# Patient Record
Sex: Male | Born: 1960
Health system: Southern US, Community
[De-identification: ages and names within clinical notes are randomized; demographics above are authoritative.]

## PROBLEM LIST (undated history)

## (undated) DIAGNOSIS — Z955 Presence of coronary angioplasty implant and graft: Secondary | ICD-10-CM

## (undated) DIAGNOSIS — Z8709 Personal history of other diseases of the respiratory system: Secondary | ICD-10-CM

## (undated) DIAGNOSIS — K5730 Diverticulosis of large intestine without perforation or abscess without bleeding: Secondary | ICD-10-CM

## (undated) DIAGNOSIS — Z8619 Personal history of other infectious and parasitic diseases: Secondary | ICD-10-CM

## (undated) DIAGNOSIS — M539 Dorsopathy, unspecified: Secondary | ICD-10-CM

## (undated) DIAGNOSIS — L409 Psoriasis, unspecified: Secondary | ICD-10-CM

## (undated) DIAGNOSIS — G4733 Obstructive sleep apnea (adult) (pediatric): Secondary | ICD-10-CM

## (undated) DIAGNOSIS — Z960 Presence of urogenital implants: Secondary | ICD-10-CM

## (undated) DIAGNOSIS — N401 Enlarged prostate with lower urinary tract symptoms: Secondary | ICD-10-CM

## (undated) DIAGNOSIS — R338 Other retention of urine: Secondary | ICD-10-CM

## (undated) DIAGNOSIS — Z978 Presence of other specified devices: Secondary | ICD-10-CM

## (undated) DIAGNOSIS — I10 Essential (primary) hypertension: Secondary | ICD-10-CM

## (undated) DIAGNOSIS — I2510 Atherosclerotic heart disease of native coronary artery without angina pectoris: Secondary | ICD-10-CM

## (undated) DIAGNOSIS — E785 Hyperlipidemia, unspecified: Secondary | ICD-10-CM

## (undated) HISTORY — DX: Obstructive sleep apnea (adult) (pediatric): G47.33

## (undated) HISTORY — PX: CORONARY ANGIOPLASTY WITH STENT PLACEMENT: SHX49

## (undated) HISTORY — PX: COLONOSCOPY WITH PROPOFOL: SHX5780

## (undated) HISTORY — DX: Psoriasis, unspecified: L40.9

## (undated) HISTORY — DX: Atherosclerotic heart disease of native coronary artery without angina pectoris: I25.10

## (undated) HISTORY — DX: Hyperlipidemia, unspecified: E78.5

## (undated) HISTORY — DX: Essential (primary) hypertension: I10

---

## 1998-05-27 HISTORY — PX: BUNIONECTOMY: SHX129

## 1999-05-28 HISTORY — PX: WRIST GANGLION EXCISION: SUR520

## 2003-11-21 ENCOUNTER — Encounter: Payer: Self-pay | Admitting: Internal Medicine

## 2004-08-23 ENCOUNTER — Ambulatory Visit: Payer: Self-pay | Admitting: Family Medicine

## 2004-11-23 ENCOUNTER — Ambulatory Visit: Payer: Self-pay | Admitting: Internal Medicine

## 2004-12-24 ENCOUNTER — Ambulatory Visit: Payer: Self-pay | Admitting: Internal Medicine

## 2005-03-07 ENCOUNTER — Encounter: Admission: RE | Admit: 2005-03-07 | Discharge: 2005-03-07 | Payer: Self-pay | Admitting: Internal Medicine

## 2005-03-07 ENCOUNTER — Ambulatory Visit: Payer: Self-pay | Admitting: Internal Medicine

## 2005-03-11 ENCOUNTER — Ambulatory Visit: Payer: Self-pay | Admitting: Internal Medicine

## 2005-03-11 ENCOUNTER — Encounter: Admission: RE | Admit: 2005-03-11 | Discharge: 2005-03-11 | Payer: Self-pay | Admitting: Internal Medicine

## 2005-03-28 ENCOUNTER — Ambulatory Visit: Payer: Self-pay | Admitting: Internal Medicine

## 2005-04-29 ENCOUNTER — Ambulatory Visit: Payer: Self-pay | Admitting: Internal Medicine

## 2005-06-17 ENCOUNTER — Ambulatory Visit: Payer: Self-pay | Admitting: Internal Medicine

## 2005-07-02 ENCOUNTER — Ambulatory Visit: Payer: Self-pay | Admitting: Internal Medicine

## 2005-07-29 ENCOUNTER — Ambulatory Visit: Payer: Self-pay | Admitting: Internal Medicine

## 2005-12-09 ENCOUNTER — Ambulatory Visit: Payer: Self-pay | Admitting: Internal Medicine

## 2006-02-10 ENCOUNTER — Ambulatory Visit: Payer: Self-pay | Admitting: Internal Medicine

## 2006-06-18 ENCOUNTER — Ambulatory Visit: Payer: Self-pay | Admitting: Internal Medicine

## 2006-06-18 LAB — CONVERTED CEMR LAB
ALT: 33 units/L (ref 0–40)
AST: 25 units/L (ref 0–37)
Basophils Absolute: 0 10*3/uL (ref 0.0–0.1)
Basophils Relative: 0.5 % (ref 0.0–1.0)
Calcium: 9.4 mg/dL (ref 8.4–10.5)
Cholesterol: 207 mg/dL (ref 0–200)
Direct LDL: 130.7 mg/dL
Eosinophils Absolute: 0 10*3/uL (ref 0.0–0.6)
Eosinophils Relative: 0.6 % (ref 0.0–5.0)
Free T4: 0.8 ng/dL (ref 0.6–1.6)
HCT: 47.2 % (ref 39.0–52.0)
HDL: 42.5 mg/dL (ref >39.0)
Hemoglobin: 16.5 g/dL (ref 13.0–17.0)
Hgb A1c MFr Bld: 5.2 % (ref 4.6–6.0)
Lymphocytes Relative: 17.9 % (ref 12.0–46.0)
MCHC: 34.9 g/dL (ref 30.0–36.0)
MCV: 89.7 fL (ref 78.0–100.0)
Magnesium: 2.2 mg/dL (ref 1.5–2.5)
Monocytes Absolute: 0.3 10*3/uL (ref 0.2–0.7)
Monocytes Relative: 4.6 % (ref 3.0–11.0)
Neutro Abs: 5.9 10*3/uL (ref 1.4–7.7)
Neutrophils Relative %: 76.4 % (ref 43.0–77.0)
Platelets: 316 10*3/uL (ref 150–400)
Potassium: 4.1 meq/L (ref 3.5–5.1)
RBC: 5.26 M/uL (ref 4.22–5.81)
RDW: 12.6 % (ref 11.5–14.6)
T3, Free: 3.3 pg/mL (ref 2.3–4.2)
TSH: 0.77 microintl units/mL (ref 0.35–5.50)
Total CHOL/HDL Ratio: 4.9
Triglycerides: 201 mg/dL (ref 0–149)
Uric Acid, Serum: 5.3 mg/dL (ref 2.4–7.0)
VLDL: 40 mg/dL (ref 0–40)
WBC: 7.6 10*3/uL (ref 4.5–10.5)

## 2006-06-30 ENCOUNTER — Ambulatory Visit: Payer: Self-pay | Admitting: Internal Medicine

## 2006-08-28 DIAGNOSIS — T782XXA Anaphylactic shock, unspecified, initial encounter: Secondary | ICD-10-CM

## 2006-12-02 ENCOUNTER — Telehealth (INDEPENDENT_AMBULATORY_CARE_PROVIDER_SITE_OTHER): Payer: Self-pay | Admitting: *Deleted

## 2007-01-06 ENCOUNTER — Ambulatory Visit: Payer: Self-pay | Admitting: Internal Medicine

## 2007-01-06 DIAGNOSIS — K219 Gastro-esophageal reflux disease without esophagitis: Secondary | ICD-10-CM | POA: Insufficient documentation

## 2007-01-06 DIAGNOSIS — L408 Other psoriasis: Secondary | ICD-10-CM | POA: Insufficient documentation

## 2007-01-14 ENCOUNTER — Encounter: Payer: Self-pay | Admitting: Internal Medicine

## 2007-01-15 ENCOUNTER — Encounter (INDEPENDENT_AMBULATORY_CARE_PROVIDER_SITE_OTHER): Payer: Self-pay | Admitting: *Deleted

## 2007-09-16 ENCOUNTER — Telehealth (INDEPENDENT_AMBULATORY_CARE_PROVIDER_SITE_OTHER): Payer: Self-pay | Admitting: *Deleted

## 2007-11-10 ENCOUNTER — Ambulatory Visit: Payer: Self-pay | Admitting: Internal Medicine

## 2007-11-10 DIAGNOSIS — E785 Hyperlipidemia, unspecified: Secondary | ICD-10-CM | POA: Insufficient documentation

## 2007-11-10 DIAGNOSIS — Z8739 Personal history of other diseases of the musculoskeletal system and connective tissue: Secondary | ICD-10-CM

## 2007-11-10 DIAGNOSIS — I10 Essential (primary) hypertension: Secondary | ICD-10-CM

## 2007-11-10 LAB — CONVERTED CEMR LAB
Cholesterol, target level: 200 mg/dL
HDL goal, serum: 40 mg/dL
LDL Goal: 130 mg/dL

## 2007-11-22 LAB — CONVERTED CEMR LAB
ALT: 35 units/L (ref 0–53)
AST: 27 units/L (ref 0–37)
Albumin: 4.1 g/dL (ref 3.5–5.2)
Alkaline Phosphatase: 53 units/L (ref 39–117)
BUN: 11 mg/dL (ref 6–23)
Basophils Absolute: 0 10*3/uL (ref 0.0–0.1)
Basophils Relative: 0.6 % (ref 0.0–1.0)
Bilirubin, Direct: 0.1 mg/dL (ref 0.0–0.3)
CO2: 32 meq/L (ref 19–32)
Calcium: 9 mg/dL (ref 8.4–10.5)
Chloride: 102 meq/L (ref 96–112)
Cholesterol: 222 mg/dL (ref 0–200)
Creatinine, Ser: 0.9 mg/dL (ref 0.4–1.5)
Direct LDL: 150 mg/dL
Eosinophils Absolute: 0.3 10*3/uL (ref 0.0–0.7)
Eosinophils Relative: 4.2 % (ref 0.0–5.0)
GFR calc Af Amer: 116 mL/min
GFR calc non Af Amer: 96 mL/min
Glucose, Bld: 88 mg/dL (ref 70–99)
HCT: 47.2 % (ref 39.0–52.0)
HDL: 34.5 mg/dL — ABNORMAL LOW (ref >39.0)
Hemoglobin: 15.9 g/dL (ref 13.0–17.0)
Lymphocytes Relative: 30.9 % (ref 12.0–46.0)
MCHC: 33.7 g/dL (ref 30.0–36.0)
MCV: 92.8 fL (ref 78.0–100.0)
Monocytes Absolute: 0.5 10*3/uL (ref 0.1–1.0)
Monocytes Relative: 8 % (ref 3.0–12.0)
Neutro Abs: 3.8 10*3/uL (ref 1.4–7.7)
Neutrophils Relative %: 56.3 % (ref 43.0–77.0)
Platelets: 300 10*3/uL (ref 150–400)
Potassium: 4.7 meq/L (ref 3.5–5.1)
RBC: 5.09 M/uL (ref 4.22–5.81)
RDW: 12.7 % (ref 11.5–14.6)
Sodium: 140 meq/L (ref 135–145)
TSH: 1.11 microintl units/mL (ref 0.35–5.50)
Total Bilirubin: 0.8 mg/dL (ref 0.3–1.2)
Total CHOL/HDL Ratio: 6.4
Total Protein: 7 g/dL (ref 6.0–8.3)
Triglycerides: 192 mg/dL — ABNORMAL HIGH (ref 0–149)
Uric Acid, Serum: 5.4 mg/dL (ref 4.0–7.8)
VLDL: 38 mg/dL (ref 0–40)
WBC: 6.6 10*3/uL (ref 4.5–10.5)

## 2007-11-24 ENCOUNTER — Encounter (INDEPENDENT_AMBULATORY_CARE_PROVIDER_SITE_OTHER): Payer: Self-pay | Admitting: *Deleted

## 2008-01-31 ENCOUNTER — Emergency Department (HOSPITAL_BASED_OUTPATIENT_CLINIC_OR_DEPARTMENT_OTHER): Admission: EM | Admit: 2008-01-31 | Discharge: 2008-01-31 | Payer: Self-pay | Admitting: Emergency Medicine

## 2008-04-25 ENCOUNTER — Ambulatory Visit: Payer: Self-pay | Admitting: Internal Medicine

## 2008-04-26 ENCOUNTER — Encounter: Payer: Self-pay | Admitting: Internal Medicine

## 2008-04-26 DIAGNOSIS — I2510 Atherosclerotic heart disease of native coronary artery without angina pectoris: Secondary | ICD-10-CM

## 2008-04-26 HISTORY — DX: Atherosclerotic heart disease of native coronary artery without angina pectoris: I25.10

## 2008-04-29 ENCOUNTER — Ambulatory Visit: Payer: Self-pay

## 2008-04-29 ENCOUNTER — Encounter: Payer: Self-pay | Admitting: Internal Medicine

## 2008-05-04 ENCOUNTER — Encounter: Payer: Self-pay | Admitting: Internal Medicine

## 2008-05-06 ENCOUNTER — Encounter (INDEPENDENT_AMBULATORY_CARE_PROVIDER_SITE_OTHER): Payer: Self-pay | Admitting: *Deleted

## 2008-05-12 ENCOUNTER — Ambulatory Visit: Payer: Self-pay | Admitting: Cardiovascular Disease

## 2008-05-12 LAB — CONVERTED CEMR LAB
BUN: 13 mg/dL (ref 6–23)
Basophils Absolute: 0 10*3/uL (ref 0.0–0.1)
Basophils Relative: 0.3 % (ref 0.0–3.0)
CO2: 30 meq/L (ref 19–32)
Calcium: 9.1 mg/dL (ref 8.4–10.5)
Chloride: 105 meq/L (ref 96–112)
Creatinine, Ser: 0.8 mg/dL (ref 0.4–1.5)
Eosinophils Absolute: 0.1 10*3/uL (ref 0.0–0.7)
Eosinophils Relative: 1.9 % (ref 0.0–5.0)
GFR calc Af Amer: 132 mL/min
GFR calc non Af Amer: 109 mL/min
Glucose, Bld: 87 mg/dL (ref 70–99)
HCT: 46.1 % (ref 39.0–52.0)
Hemoglobin: 15.9 g/dL (ref 13.0–17.0)
INR: 1 (ref 0.8–1.0)
Lymphocytes Relative: 25.9 % (ref 12.0–46.0)
MCHC: 34.6 g/dL (ref 30.0–36.0)
MCV: 90.9 fL (ref 78.0–100.0)
Monocytes Absolute: 0.7 10*3/uL (ref 0.1–1.0)
Monocytes Relative: 9.8 % (ref 3.0–12.0)
Neutro Abs: 4.3 10*3/uL (ref 1.4–7.7)
Neutrophils Relative %: 62.1 % (ref 43.0–77.0)
Platelets: 300 10*3/uL (ref 150–400)
Potassium: 4.2 meq/L (ref 3.5–5.1)
Prothrombin Time: 10.7 s — ABNORMAL LOW (ref 10.9–13.3)
RBC: 5.07 M/uL (ref 4.22–5.81)
RDW: 12.3 % (ref 11.5–14.6)
Sodium: 140 meq/L (ref 135–145)
WBC: 6.9 10*3/uL (ref 4.5–10.5)

## 2008-05-13 ENCOUNTER — Ambulatory Visit: Payer: Self-pay | Admitting: Cardiovascular Disease

## 2008-05-13 ENCOUNTER — Inpatient Hospital Stay (HOSPITAL_BASED_OUTPATIENT_CLINIC_OR_DEPARTMENT_OTHER): Admission: RE | Admit: 2008-05-13 | Discharge: 2008-05-13 | Payer: Self-pay | Admitting: Cardiovascular Disease

## 2008-05-13 ENCOUNTER — Inpatient Hospital Stay (HOSPITAL_COMMUNITY): Admission: AD | Admit: 2008-05-13 | Discharge: 2008-05-14 | Payer: Self-pay | Admitting: Cardiology

## 2008-05-14 ENCOUNTER — Ambulatory Visit: Payer: Self-pay | Admitting: Cardiology

## 2008-06-07 ENCOUNTER — Ambulatory Visit: Payer: Self-pay | Admitting: Cardiovascular Disease

## 2008-08-03 ENCOUNTER — Ambulatory Visit: Payer: Self-pay | Admitting: Cardiovascular Disease

## 2008-08-03 DIAGNOSIS — I2510 Atherosclerotic heart disease of native coronary artery without angina pectoris: Secondary | ICD-10-CM | POA: Insufficient documentation

## 2008-10-11 ENCOUNTER — Encounter: Payer: Self-pay | Admitting: Internal Medicine

## 2008-11-15 ENCOUNTER — Telehealth (INDEPENDENT_AMBULATORY_CARE_PROVIDER_SITE_OTHER): Payer: Self-pay | Admitting: *Deleted

## 2008-11-15 ENCOUNTER — Telehealth: Payer: Self-pay | Admitting: Internal Medicine

## 2008-12-21 ENCOUNTER — Telehealth (INDEPENDENT_AMBULATORY_CARE_PROVIDER_SITE_OTHER): Payer: Self-pay | Admitting: *Deleted

## 2008-12-21 ENCOUNTER — Encounter: Payer: Self-pay | Admitting: Internal Medicine

## 2008-12-22 ENCOUNTER — Ambulatory Visit: Payer: Self-pay

## 2008-12-22 ENCOUNTER — Ambulatory Visit: Payer: Self-pay | Admitting: Cardiovascular Disease

## 2009-01-19 ENCOUNTER — Telehealth: Payer: Self-pay | Admitting: Cardiovascular Disease

## 2009-02-02 ENCOUNTER — Telehealth (INDEPENDENT_AMBULATORY_CARE_PROVIDER_SITE_OTHER): Payer: Self-pay | Admitting: *Deleted

## 2009-02-14 ENCOUNTER — Telehealth (INDEPENDENT_AMBULATORY_CARE_PROVIDER_SITE_OTHER): Payer: Self-pay | Admitting: *Deleted

## 2009-02-14 ENCOUNTER — Ambulatory Visit: Payer: Self-pay | Admitting: Internal Medicine

## 2009-02-14 ENCOUNTER — Encounter: Payer: Self-pay | Admitting: Cardiovascular Disease

## 2009-02-15 ENCOUNTER — Encounter (INDEPENDENT_AMBULATORY_CARE_PROVIDER_SITE_OTHER): Payer: Self-pay | Admitting: *Deleted

## 2009-03-21 ENCOUNTER — Ambulatory Visit: Payer: Self-pay | Admitting: Internal Medicine

## 2009-03-23 ENCOUNTER — Encounter: Payer: Self-pay | Admitting: Internal Medicine

## 2009-04-07 ENCOUNTER — Encounter (INDEPENDENT_AMBULATORY_CARE_PROVIDER_SITE_OTHER): Payer: Self-pay | Admitting: *Deleted

## 2009-04-17 ENCOUNTER — Telehealth: Payer: Self-pay | Admitting: Cardiovascular Disease

## 2009-06-12 ENCOUNTER — Ambulatory Visit: Payer: Self-pay | Admitting: Cardiovascular Disease

## 2009-06-20 ENCOUNTER — Telehealth: Payer: Self-pay | Admitting: Cardiovascular Disease

## 2009-07-31 ENCOUNTER — Ambulatory Visit: Payer: Self-pay | Admitting: Internal Medicine

## 2009-08-08 LAB — CONVERTED CEMR LAB
Hep B Core Total Ab: NEGATIVE
Mumps IgG: 3.59 — ABNORMAL HIGH
Rubella: 62.7 intl units/mL — ABNORMAL HIGH
Rubeola IgG: 6.57 — ABNORMAL HIGH
Varicella-Zoster Ab, IgM: 1.44 — ABNORMAL HIGH (ref <0.91)

## 2009-08-10 ENCOUNTER — Ambulatory Visit: Payer: Self-pay | Admitting: Internal Medicine

## 2009-09-12 ENCOUNTER — Ambulatory Visit: Payer: Self-pay | Admitting: Internal Medicine

## 2009-10-30 ENCOUNTER — Ambulatory Visit: Payer: Self-pay | Admitting: Family Medicine

## 2010-02-19 ENCOUNTER — Ambulatory Visit: Payer: Self-pay | Admitting: Internal Medicine

## 2010-02-19 DIAGNOSIS — I951 Orthostatic hypotension: Secondary | ICD-10-CM | POA: Insufficient documentation

## 2010-02-26 ENCOUNTER — Telehealth (INDEPENDENT_AMBULATORY_CARE_PROVIDER_SITE_OTHER): Payer: Self-pay | Admitting: *Deleted

## 2010-03-13 ENCOUNTER — Ambulatory Visit: Payer: Self-pay | Admitting: Internal Medicine

## 2010-06-24 LAB — CONVERTED CEMR LAB
ALT: 23 units/L (ref 0–53)
ALT: 26 units/L (ref 0–53)
ALT: 30 units/L (ref 0–53)
AST: 23 units/L (ref 0–37)
AST: 25 units/L (ref 0–37)
AST: 25 units/L (ref 0–37)
Albumin: 4.3 g/dL (ref 3.5–5.2)
Albumin: 4.3 g/dL (ref 3.5–5.2)
Albumin: 4.3 g/dL (ref 3.5–5.2)
Alkaline Phosphatase: 58 units/L (ref 39–117)
Alkaline Phosphatase: 63 units/L (ref 39–117)
Alkaline Phosphatase: 63 units/L (ref 39–117)
BUN: 11 mg/dL (ref 6–23)
BUN: 12 mg/dL (ref 6–23)
BUN: 14 mg/dL (ref 6–23)
Basophils Absolute: 0 10*3/uL (ref 0.0–0.1)
Basophils Absolute: 0 10*3/uL (ref 0.0–0.1)
Basophils Absolute: 0.1 10*3/uL (ref 0.0–0.1)
Basophils Relative: 0.5 % (ref 0.0–3.0)
Basophils Relative: 0.6 % (ref 0.0–1.0)
Basophils Relative: 0.8 % (ref 0.0–3.0)
Bilirubin, Direct: 0 mg/dL (ref 0.0–0.3)
Bilirubin, Direct: 0.1 mg/dL (ref 0.0–0.3)
Bilirubin, Direct: 0.1 mg/dL (ref 0.0–0.3)
CO2: 30 meq/L (ref 19–32)
CO2: 31 meq/L (ref 19–32)
CO2: 33 meq/L — ABNORMAL HIGH (ref 19–32)
Calcium: 9.3 mg/dL (ref 8.4–10.5)
Calcium: 9.3 mg/dL (ref 8.4–10.5)
Calcium: 9.4 mg/dL (ref 8.4–10.5)
Chloride: 103 meq/L (ref 96–112)
Chloride: 103 meq/L (ref 96–112)
Chloride: 104 meq/L (ref 96–112)
Cholesterol, target level: 200 mg/dL
Cholesterol: 167 mg/dL (ref 0–200)
Cholesterol: 213 mg/dL (ref 0–200)
Creatinine, Ser: 0.9 mg/dL (ref 0.4–1.5)
Creatinine, Ser: 1 mg/dL (ref 0.4–1.5)
Creatinine, Ser: 1.1 mg/dL (ref 0.4–1.5)
Direct LDL: 138.1 mg/dL
Eosinophils Absolute: 0.2 10*3/uL (ref 0.0–0.7)
Eosinophils Absolute: 0.3 10*3/uL (ref 0.0–0.6)
Eosinophils Absolute: 0.3 10*3/uL (ref 0.0–0.7)
Eosinophils Relative: 3 % (ref 0.0–5.0)
Eosinophils Relative: 3.8 % (ref 0.0–5.0)
Eosinophils Relative: 4.5 % (ref 0.0–5.0)
GFR calc Af Amer: 116 mL/min
GFR calc non Af Amer: 79.09 mL/min (ref >60)
GFR calc non Af Amer: 84.01 mL/min (ref >60)
GFR calc non Af Amer: 96 mL/min
Glucose, Bld: 86 mg/dL (ref 70–99)
Glucose, Bld: 89 mg/dL (ref 70–99)
Glucose, Bld: 91 mg/dL (ref 70–99)
HCT: 45.4 % (ref 39.0–52.0)
HCT: 45.5 % (ref 39.0–52.0)
HCT: 46.4 % (ref 39.0–52.0)
HDL goal, serum: 40 mg/dL
HDL: 39.7 mg/dL (ref >39.00)
HDL: 39.8 mg/dL (ref >39.0)
Hemoglobin: 15.5 g/dL (ref 13.0–17.0)
Hemoglobin: 15.7 g/dL (ref 13.0–17.0)
Hemoglobin: 15.9 g/dL (ref 13.0–17.0)
LDL Cholesterol: 88 mg/dL (ref 0–99)
LDL Goal: 100 mg/dL
Lymphocytes Relative: 28 % (ref 12.0–46.0)
Lymphocytes Relative: 29.7 % (ref 12.0–46.0)
Lymphocytes Relative: 29.8 % (ref 12.0–46.0)
Lymphs Abs: 1.9 10*3/uL (ref 0.7–4.0)
Lymphs Abs: 2 10*3/uL (ref 0.7–4.0)
MCHC: 34.2 g/dL (ref 30.0–36.0)
MCHC: 34.2 g/dL (ref 30.0–36.0)
MCHC: 34.5 g/dL (ref 30.0–36.0)
MCV: 90.4 fL (ref 78.0–100.0)
MCV: 93.3 fL (ref 78.0–100.0)
MCV: 93.5 fL (ref 78.0–100.0)
Monocytes Absolute: 0.5 10*3/uL (ref 0.1–1.0)
Monocytes Absolute: 0.6 10*3/uL (ref 0.1–1.0)
Monocytes Absolute: 0.6 10*3/uL (ref 0.2–0.7)
Monocytes Relative: 8.1 % (ref 3.0–12.0)
Monocytes Relative: 8.2 % (ref 3.0–12.0)
Monocytes Relative: 8.7 % (ref 3.0–11.0)
Neutro Abs: 3.7 10*3/uL (ref 1.4–7.7)
Neutro Abs: 3.9 10*3/uL (ref 1.4–7.7)
Neutro Abs: 3.9 10*3/uL (ref 1.4–7.7)
Neutrophils Relative %: 56.5 % (ref 43.0–77.0)
Neutrophils Relative %: 58.6 % (ref 43.0–77.0)
Neutrophils Relative %: 59.2 % (ref 43.0–77.0)
PSA: 2.37 ng/mL (ref 0.10–4.00)
PSA: 2.5 ng/mL (ref 0.10–4.00)
Platelets: 258 10*3/uL (ref 150.0–400.0)
Platelets: 300 10*3/uL (ref 150.0–400.0)
Platelets: 311 10*3/uL (ref 150–400)
Potassium: 3.9 meq/L (ref 3.5–5.1)
Potassium: 4.4 meq/L (ref 3.5–5.1)
Potassium: 4.7 meq/L (ref 3.5–5.1)
RBC: 4.86 M/uL (ref 4.22–5.81)
RBC: 4.97 M/uL (ref 4.22–5.81)
RBC: 5.04 M/uL (ref 4.22–5.81)
RDW: 12.3 % (ref 11.5–14.6)
RDW: 12.3 % (ref 11.5–14.6)
RDW: 13.2 % (ref 11.5–14.6)
Sodium: 141 meq/L (ref 135–145)
Sodium: 141 meq/L (ref 135–145)
Sodium: 142 meq/L (ref 135–145)
TSH: 1.1 microintl units/mL (ref 0.35–5.50)
TSH: 1.25 microintl units/mL (ref 0.35–5.50)
TSH: 1.59 microintl units/mL (ref 0.35–5.50)
Total Bilirubin: 0.7 mg/dL (ref 0.3–1.2)
Total Bilirubin: 1 mg/dL (ref 0.3–1.2)
Total Bilirubin: 1.1 mg/dL (ref 0.3–1.2)
Total CHOL/HDL Ratio: 4
Total CHOL/HDL Ratio: 5.4
Total Protein: 7 g/dL (ref 6.0–8.3)
Total Protein: 7.3 g/dL (ref 6.0–8.3)
Total Protein: 7.5 g/dL (ref 6.0–8.3)
Triglycerides: 166 mg/dL — ABNORMAL HIGH (ref 0–149)
Triglycerides: 195 mg/dL — ABNORMAL HIGH (ref 0.0–149.0)
Uric Acid, Serum: 5.1 mg/dL (ref 4.0–7.8)
Uric Acid, Serum: 6.9 mg/dL (ref 4.0–7.8)
VLDL: 33 mg/dL (ref 0–40)
VLDL: 39 mg/dL (ref 0.0–40.0)
WBC: 6.6 10*3/uL (ref 4.5–10.5)
WBC: 6.6 10*3/uL (ref 4.5–10.5)
WBC: 6.8 10*3/uL (ref 4.5–10.5)

## 2010-06-26 NOTE — Assessment & Plan Note (Signed)
Summary: cpx/kdc   Vital Signs:  Patient profile:   50 year old male Height:      69 inches Weight:      220.4 pounds BMI:     32.67 Temp:     98.6 degrees F oral Pulse rate:   64 / minute Resp:     14 per minute BP sitting:   128 / 84  (left arm) Cuff size:   large  Vitals Entered By: Shonna Chock CMA (February 19, 2010 8:37 AM)    History of Present Illness: Charles [name] is here for a physical; he still has postural hypotension after prolonged sitting. Hyperlipidemia Follow-Up      This is a 50 year old man who also  presents for Hyperlipidemia follow-up.  The patient denies muscle aches, GI upset, abdominal pain, flushing, itching, constipation, diarrhea, and fatigue.  The patient denies the following symptoms: chest pain/pressure, exercise intolerance, dypsnea, palpitations, syncope, and pedal edema.  Compliance with medications (by patient report) has been near 100%.  Dietary compliance has been fair.  Adjunctive measures currently used by the patient include ASA, folic acid, fish oil supplements, and Co-QA.    Lipid Management History:      Positive NCEP/ATP III risk factors include male age 52 years old or older, HDL cholesterol less than 40, hypertension, and ASHD (either angina/prior MI/prior CABG).  Negative NCEP/ATP III risk factors include non-diabetic, no family history for ischemic heart disease, non-tobacco-user status, no prior stroke/TIA, no peripheral vascular disease, and no history of aortic aneurysm.     Current Medications (verified): 1)  Folic Acid 1 Mg  Tabs (Folic Acid) .Marland Kitchen.. 1po Once Daily 2)  Flonase 50 Mcg/act  Susp (Fluticasone Propionate) .... Use As Directed 3)  Toprol Xl 100 Mg Xr24h-Tab (Metoprolol Succinate) .Marland Kitchen.. 1 By Mouth Once Daily 4)  Fluoxetine Hcl 10 Mg  Caps (Fluoxetine Hcl) .... Take 1 Every Morning 5)  Clobetasol Propionate E 0.05 %  Crea (Clobetasol Prop Emollient Base) .... Use As Directed 6)  Allopurinol 300 Mg  Tabs (Allopurinol) .Marland Kitchen.. 1  By Mouth Qd 7)  Fish Oil   Oil (Fish Oil) .Marland Kitchen.. 1 By Mouth Two Times A Day 8)  Fluocinonide Topical Sol 0.05% 60ml .... Daily 9)  Fluocinonide Cream 0.05% 60gram .... Daily 10)  Multivitamin 11)  Aspirin 325 Mg Tabs (Aspirin) .Marland Kitchen.. 1 By Mouth Once Daily 12)  Nitrostat 0.3 Mg Subl (Nitroglycerin) .Marland Kitchen.. 1 As Needed Chest Pain 13)  Crestor 10 Mg Tabs (Rosuvastatin Calcium) .... Take One Tablet By Mouth Daily.  Allergies: 1)  ! * Hctz 2)  ! * Neomycin Sulfate 3)  ! * Lidocaine 4)  ! * Sulfa and Sulfates  Past History:  Past Medical History: Anaphylaxis in context of Neomycin & Lidocaine topically  in  1991 Gout, PMH of  Hyperlipidemia: minimal goal = < 100, ideal < 70 Hypertension Psoriasis CAD, Dr [name];  Successful PCI of the lesion in the proximal LAD using a   Xience drug-eluting stent with improvement in central narrowing from 95% to   0%.  04/2008 Juanda Chance  Past Surgical History: Bunionectomy; ganglionectomy PTCA/stent 04/2008  Family History: Father:HTN,CVA  Mother:lung  cancer , smoker Siblings: 2 sisters: MVP; bro: HTN; no FH MI; P aunt: DM;son :asthma  Social History: Low fat/ low carb Occupation: Naval architect Married Alcohol use-yes: rarely Regular exercise-yes: treadmill 20 min 3-4X/week; Bowflex 20 min 3-4X/week; tread climber 20  min 3-4X/week w/o symptoms  Review of Systems  The patient  denies anorexia, fever, weight loss, weight gain, vision loss, decreased hearing, hoarseness, prolonged cough, headaches, hemoptysis, melena, hematochezia, severe indigestion/heartburn, hematuria, suspicious skin lesions, depression, unusual weight change, abnormal bleeding, enlarged lymph nodes, and angioedema.    Physical Exam  General:  well-nourished,in no acute distress (near syncope with blood draw); alert,appropriate and cooperative throughout examination Head:  Normocephalic and atraumatic without obvious abnormalities. No apparent alopecia  Eyes:  No corneal  or conjunctival inflammation noted. Perrla. Funduscopic exam benign, without hemorrhages, exudates or papilledema.  Ears:  External ear exam shows no significant lesions or deformities.  Otoscopic examination reveals clear canals, tympanic membranes are intact bilaterally without bulging, retraction, inflammation or discharge. Hearing is grossly normal bilaterally. Nose:  External nasal examination shows no deformity or inflammation. Nasal mucosa are pink and moist without lesions or exudates. Slight septal deviation Mouth:  Oral mucosa and oropharynx without lesions or exudates.  Teeth in good repair. Neck:  No deformities, masses, or tenderness noted. Lungs:  Normal respiratory effort, chest expands symmetrically. Lungs are clear to auscultation, no crackles or wheezes. Heart:  regular rhythm, no murmur, no gallop, no rub, no JVD, no HJR, and bradycardia.   Abdomen:  Bowel sounds positive,abdomen soft and non-tender without masses, organomegaly or hernias noted. Rectal:  No external abnormalities noted. Normal sphincter tone. No rectal masses or tenderness. Genitalia:  Testes bilaterally descended without nodularity, tenderness or masses. No scrotal masses or lesions. No penis lesions or urethral discharge. Prostate:  Prostate gland firm and smooth, no enlargement, nodularity, tenderness, mass, asymmetry or induration. Msk:  No deformity or scoliosis noted of thoracic or lumbar spine but R thoracic musculate > L.   Pulses:  R and L carotid,radial,dorsalis pedis and posterior tibial pulses are full and equal bilaterally Extremities:  No clubbing, cyanosis, edema, or deformity noted with normal full range of motion of all joints.   Neurologic:  alert & oriented X3 and DTRs symmetrical and normal.   Skin:  Elbow &  gluteal  Psoriatic lesions;skin damp Cervical Nodes:  No lymphadenopathy noted Axillary Nodes:  No palpable lymphadenopathy Psych:  memory intact for recent and remote, normally  interactive, and good eye contact.     Impression & Recommendations:  Problem # 1:  ROUTINE GENERAL MEDICAL EXAM@HEALTH  CARE FACL (ICD-V70.0)  Orders: EKG w/ Interpretation (93000) Venipuncture (16109) TLB-Lipid Panel (80061-LIPID) TLB-BMP (Basic Metabolic Panel-BMET) (80048-METABOL) TLB-CBC Platelet - w/Differential (85025-CBCD) TLB-Hepatic/Liver Function Pnl (80076-HEPATIC) TLB-TSH (Thyroid Stimulating Hormone) (84443-TSH) TLB-PSA (Prostate Specific Antigen) (84153-PSA) TLB-Uric Acid, Blood (84550-URIC) Specimen Handling (60454)  Problem # 2:  ORTHOSTATIC HYPOTENSION (ICD-458.0)  Problem # 3:  CORONARY ATHEROSCLEROSIS, NATIVE VESSEL (ICD-414.01)  His updated medication list for this problem includes:    Toprol Xl 100 Mg Xr24h-tab (Metoprolol succinate) .Marland Kitchen... 1 by mouth once daily    Aspirin 325 Mg Tabs (Aspirin) .Marland Kitchen... 1 by mouth once daily    Nitrostat 0.3 Mg Subl (Nitroglycerin) .Marland Kitchen... 1 as needed chest pain  Problem # 4:  HYPERLIPIDEMIA (ICD-272.4)  His updated medication list for this problem includes:    Crestor 10 Mg Tabs (Rosuvastatin calcium) .Marland Kitchen... Take one tablet by mouth daily.  Problem # 5:  HYPERTENSION (ICD-401.9)  His updated medication list for this problem includes:    Toprol Xl 100 Mg Xr24h-tab (Metoprolol succinate) .Marland Kitchen... 1 by mouth once daily  Problem # 6:  GOUT (ICD-274.9)  PMH of His updated medication list for this problem includes:    Allopurinol 300 Mg Tabs (Allopurinol) .Marland Kitchen... 1 by mouth qd  Orders: TLB-Uric Acid, Blood (84550-URIC)  Complete Medication List: 1)  Folic Acid 1 Mg Tabs (Folic acid) .Marland Kitchen.. 1po once daily 2)  Flonase 50 Mcg/act Susp (Fluticasone propionate) .... Use as directed 3)  Toprol Xl 100 Mg Xr24h-tab (Metoprolol succinate) .Marland Kitchen.. 1 by mouth once daily 4)  Fluoxetine Hcl 10 Mg Caps (Fluoxetine hcl) .... Take 1 every morning 5)  Clobetasol Propionate E 0.05 % Crea (Clobetasol prop emollient base) .... Use as directed 6)   Allopurinol 300 Mg Tabs (Allopurinol) .Marland Kitchen.. 1 by mouth qd 7)  Fish Oil Oil (Fish oil) .Marland Kitchen.. 1 by mouth two times a day 8)  Fluocinonide Topical Sol 0.05% 60ml  .... Daily 9)  Fluocinonide Cream 0.05% 60gram  .... Daily 10)  Multivitamin  11)  Aspirin 325 Mg Tabs (Aspirin) .Marland Kitchen.. 1 by mouth once daily 12)  Nitrostat 0.3 Mg Subl (Nitroglycerin) .Marland Kitchen.. 1 as needed chest pain 13)  Crestor 10 Mg Tabs (Rosuvastatin calcium) .... Take one tablet by mouth daily.  Other Orders: Admin 1st Vaccine (29562) Flu Vaccine 90yrs + [phone])  Lipid Assessment/Plan:      Based on NCEP/ATP III, the patient's risk factor category is "history of coronary disease, peripheral vascular disease, cerebrovascular disease, or aortic aneurysm".  The patient's lipid goals are as follows: Total cholesterol goal is 200; LDL cholesterol goal is 100; HDL cholesterol goal is 40; Triglyceride goal is 150.    Patient Instructions: 1)  Schedule a colonoscopy  to help detect colon cancer as per [facility] as discussed.This must be cleared with Dr [name]. Isometric exercises pre standing & wear support hose. Prescriptions: CRESTOR 10 MG TABS (ROSUVASTATIN CALCIUM) Take one tablet by mouth daily.  #90 x 3   Entered and Authorized by:   Marga Melnick MD   Signed by:   Marga Melnick MD on 02/19/2010   Method used:   Print then Give to Patient   RxID:   [id number] NITROSTAT 0.3 MG SUBL (NITROGLYCERIN) 1 as needed chest pain  #25 x 12   Entered and Authorized by:   Marga Melnick MD   Signed by:   Marga Melnick MD on 02/19/2010   Method used:   Print then Give to Patient   RxID:   [id number] FLUOCINONIDE CREAM 0.05% 60GRAM daily  #60 gr x 3   Entered and Authorized by:   Marga Melnick MD   Signed by:   Marga Melnick MD on 02/19/2010   Method used:   Print then Give to Patient   RxID:   [id number] FLUOCINONIDE TOPICAL SOL 0.05% daily  #60 ml # 6 x 3   Entered and Authorized by:   Marga Melnick MD   Signed by:    Marga Melnick MD on 02/19/2010   Method used:   Print then Give to Patient   RxID:   [id number] ALLOPURINOL 300 MG  TABS (ALLOPURINOL) 1 by mouth qd  #90 x 3   Entered and Authorized by:   Marga Melnick MD   Signed by:   Marga Melnick MD on 02/19/2010   Method used:   Print then Give to Patient   RxID:   [id number] CLOBETASOL PROPIONATE E 0.05 %  CREA (CLOBETASOL PROP EMOLLIENT BASE) use as directed  #60 gr #6 x 3   Entered and Authorized by:   Marga Melnick MD   Signed by:   Marga Melnick MD on 02/19/2010   Method used:   Print then Give to Patient   RxID:   [id number] TOPROL XL  100 MG XR24H-TAB (METOPROLOL SUCCINATE) 1 by mouth once daily  #90 x 3   Entered and Authorized by:   Marga Melnick MD   Signed by:   Marga Melnick MD on 02/19/2010   Method used:   Print then Give to Patient   RxID:   [id number] FLONASE 50 MCG/ACT  SUSP (FLUTICASONE PROPIONATE) USE AS DIRECTED  #3 x 3   Entered and Authorized by:   Marga Melnick MD   Signed by:   Marga Melnick MD on 02/19/2010   Method used:   Print then Give to Patient   RxID:   [id number] FOLIC ACID 1 MG  TABS (FOLIC ACID) 1PO once daily  #90 x 3   Entered and Authorized by:   Marga Melnick MD   Signed by:   Marga Melnick MD on 02/19/2010   Method used:   Print then Give to Patient   RxID:   [id number]  Flu Vaccine Consent Questions     Do you have a history of severe allergic reactions to this vaccine? no    Any prior history of allergic reactions to egg and/or gelatin? no    Do you have a sensitivity to the preservative Thimersol? no    Do you have a past history of Guillan-Barre Syndrome? no    Do you currently have an acute febrile illness? no    Have you ever had a severe reaction to latex? no    Vaccine information given and explained to patient? yes    Are you currently pregnant? no    Lot Number:[id number]   Exp Date:11/24/2010   Site Given  Left Deltoid IMd by:   Marga Melnick  MD on 02/19/2010   Method used:   Print then Give to Patient   RxID:   [id number]    .lbflu

## 2010-06-26 NOTE — Assessment & Plan Note (Signed)
Summary: 2ND HEP B INJ//PH  Nurse Visit   Allergies: 1)  ! * Hctz 2)  ! * Neomycin Sulfate 3)  ! * Lidocaine 4)  ! * Sulfa and Sulfates  Hepatitis B Vaccine # 2 (to be given today)  Orders Added: 1)  Hepatitis B Vaccine >86yrs [90746] 2)  Admin 1st Vaccine [90471] 3)  Admin 1st Vaccine [facility]) [90471S]   Hepatitis B Vaccine # 2    Vaccine Type: HepB Adult    Site: left deltoid    Mfr: Merck    Dose: 1.0 ml    Route: IM    Given by: Jeremy Johann CMA    Exp. Date: 03/04/2011    Lot #: [id number]    VIS given: 12/11/05 version given September 12, 2009.

## 2010-06-26 NOTE — Progress Notes (Signed)
Summary: FLUOXETINE REFILL  Phone Note Refill Request Call back at Work Phone [phone] Message from:  Patient on February 26, 2010 4:40 PM  Refills Requested: Medication #1:  FLUOXETINE HCL 10 MG  CAPS TAKE 1 EVERY MORNING [facility]  Initial call taken by: Jerolyn Shin,  February 26, 2010 4:40 PM    Prescriptions: FLUOXETINE HCL 10 MG  CAPS (FLUOXETINE HCL) TAKE 1 EVERY MORNING  #90 x 3   Entered by:   Shonna Chock CMA   Authorized by:   Marga Melnick MD   Signed by:   Shonna Chock CMA on 02/27/2010   Method used:   Faxed to ...       [facility] (mail[facility]order)       [address] [place], [place]  [address]       Ph: [phone]       Fax: [phone]   RxID:   [id number]

## 2010-06-26 NOTE — Assessment & Plan Note (Signed)
Summary: hep b/# 3/kdc  Nurse Visit   Allergies: 1)  ! * Hctz 2)  ! * Neomycin Sulfate 3)  ! * Lidocaine 4)  ! * Sulfa and Sulfates  Immunizations Administered:  Tetanus Vaccine:    Vaccine Type: Tdap    Site: right deltoid    Mfr: GlaxoSmithKline    Dose: 0.5 ml    Route: IM    Given by: Army Fossa CMA    Exp. Date: 03/15/2012    Lot #: [id number]  Hepatitis B Vaccine # 3:    Vaccine Type: HepB Adolescent    Site: left deltoid    Mfr: Merck    Dose: 1.0 ml    Route: IM    Given by: Army Fossa CMA    Exp. Date: 05/10/2012    Lot #: [id number]  Orders Added: 1)  Tdap => 40yrs IM [90715] 2)  Admin 1st Vaccine [90471] 3)  Hepatitis B Vaccine ADOLESCENT (2 dose) [90743] 4)  Admin of Any Addtl Vaccine [14782]

## 2010-06-26 NOTE — Assessment & Plan Note (Signed)
Summary: F6M/DM   History of Present Illness: Charles [name] is seen today in F/U for CAD.  He had a stent to the LAD in 2009.  He had a stress myovue in July of 2010.  It was normal with no ischemia. and an EF of 65%.  He is not having chest pain, palpitaotns, PND or orthopnea.  His LDL has consistantly been around 150.  We started him on Crestor 5 mg which he has tolerated.  He had a lipomed profile per [place].  I reviewed these results with him.  Particle number was quite high at 2219 (goal less than 1300).  HDL was 41 and LDL was 87.  We will increase his crestor to 10mg .  He knows he needs to follow a low carb diet better.  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  Current Problems (verified): 1)  Routine General Medical Exam@health  Care Facl  (ICD-V70.0) 2)  Coronary Atherosclerosis, Native Vessel  (ICD-414.01) 3)  Hypertension  (ICD-401.9) 4)  Hyperlipidemia  (ICD-272.4) 5)  Gout  (ICD-274.9) 6)  G E R D  (ICD-530.81) 7)  Disorders, Psoriasis and Similar Nec  (ICD-696.8) 8)  Anaphylactic Shock  (ICD-995.0)  Current Medications (verified): 1)  Folic Acid 1 Mg  Tabs (Folic Acid) .Marland Kitchen.. 1po Once Daily 2)  Flonase 50 Mcg/act  Susp (Fluticasone Propionate) .... Use As Directed 3)  Toprol Xl 100 Mg Xr24h-Tab (Metoprolol Succinate) .Marland Kitchen.. 1 By Mouth Once Daily 4)  Fluoxetine Hcl 10 Mg  Caps (Fluoxetine Hcl) .... Take 1 Every Morning 5)  Clobetasol Propionate E 0.05 %  Crea (Clobetasol Prop Emollient Base) .... Use As Directed 6)  Allopurinol 300 Mg  Tabs (Allopurinol) .Marland Kitchen.. 1 By Mouth Qd 7)  Fish Oil   Oil (Fish Oil) .Marland Kitchen.. 1 By Mouth Two Times A Day 8)  Fluocinonide Topical Sol 0.05% 60ml .... Daily 9)  Fluocinonide Cream 0.05% 60gram .... Daily 10)  Multivitamin 11)  Aspirin 325 Mg Tabs (Aspirin) .Marland Kitchen.. 1 By Mouth Once Daily 12)  Nitrostat 0.3 Mg Subl (Nitroglycerin) .Marland Kitchen.. 1 As Needed Chest Pain 13)  Crestor 10 Mg Tabs (Rosuvastatin Calcium) .... Take One Tablet By  Mouth Daily. 14)  Amoxicillin 500 Mg Caps (Amoxicillin) .Marland Kitchen.. 1 By Mouth Once Daily Post Dental Procedure. About 3 Days Left As of 02/14/2009  Allergies (verified): 1)  ! * Hctz 2)  ! * Neomycin Sulfate 3)  ! * Lidocaine 4)  ! * Sulfa and Sulfates  Past History:  Past Medical History: Last updated: 02/14/2009 Anaphylaxis in context of Neomycin & Lidocaine topically  in  1991 Gout Hyperlipidemia Hypertension Psoriasis CAD, Dr [name];  Successful PCI of the lesion in the proximal LAD using a   Xience drug-eluting stent with improvement in central narrowing from 95%-   0%.  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 All other systems reviewed and negative  Vital  Signs:  Patient profile:   50 year old male Height:      68 inches Weight:      222 pounds BMI:     33.88 BP sitting:   134 / 74  (left arm) Cuff size:   regular  Vitals Entered By: Hardin Negus, RMA (June 12, 2009 8:55 AM)  Physical Exam  General:  Affect appropriate Healthy:  appears stated age HEENT: normal Neck supple with no adenopathy JVP normal no bruits no thyromegaly Lungs clear with no wheezing and good diaphragmatic motion Heart:  S1/S2 no murmur,rub, gallop or click PMI normal Abdomen: benighn, BS positve, no  tenderness, no AAA no bruit.  No HSM or HJR Distal pulses intact with no bruits No edema Neuro non-focal Skin warm and dry    Impression & Recommendations:  Problem # 1:  CORONARY ATHEROSCLEROSIS, NATIVE VESSEL (ICD-414.01)  Stent to LAD over a year ago.  Plavix D/C continue ASA and BB The following medications were removed from the medication list:    Plavix 75 Mg Tabs (Clopidogrel bisulfate) .Marland Kitchen... 1 by mouth once daily His updated medication list for this problem includes:    Toprol Xl 100 Mg Xr24h-tab (Metoprolol succinate) .Marland Kitchen... 1 by mouth once daily    Aspirin 325 Mg Tabs (Aspirin) .Marland Kitchen... 1 by mouth once daily    Nitrostat 0.3 Mg Subl (Nitroglycerin) .Marland Kitchen... 1 as needed chest pain  The following medications were removed from the medication list:    Plavix 75 Mg Tabs (Clopidogrel bisulfate) .Marland Kitchen... 1 by mouth once daily His updated medication list for this problem includes:    Toprol Xl 100 Mg Xr24h-tab (Metoprolol succinate) .Marland Kitchen... 1 by mouth once daily    Aspirin 325 Mg Tabs (Aspirin) .Marland Kitchen... 1 by mouth once daily    Nitrostat 0.3 Mg Subl (Nitroglycerin) .Marland Kitchen... 1 as needed chest pain  Problem # 2:  HYPERLIPIDEMIA (ICD-272.4) Increas crestor F/U lipo med in 6-89month.  LFTs are normal His updated medication list for this problem includes:    Crestor 10 Mg Tabs (Rosuvastatin calcium) .Marland Kitchen... Take one tablet by mouth daily.  Problem # 3:  HYPERTENSION (ICD-401.9) Well controlled continue low sodium diet His updated medication list for this problem includes:    Toprol Xl 100 Mg Xr24h-tab (Metoprolol succinate) .Marland Kitchen... 1 by mouth once daily    Aspirin 325 Mg Tabs (Aspirin) .Marland Kitchen... 1 by mouth once daily  Patient Instructions: 1)  Your physician has recommended you make the following change in your medication: Crestor 5mg  increased to 10 mg once daily. 2)  Your physician recommends that you schedule a follow-up appointment in: 6 months. The office will mail a reminder card 2 months prior  appointment date. Prescriptions: TOPROL XL 100 MG XR24H-TAB (METOPROLOL SUCCINATE) 1 by mouth once daily  #90 x 3   Entered by:   Ollen Gross, RN, BSN   Authorized by:   Colon Branch, MD, [facility]   Signed by:   Ollen Gross, RN, BSN on 06/12/2009   Method used:   Faxed to ...       [facility] (mail-order)       [address]       [place], [place]  [address]  [place]       Ph:        Fax: [phone]   RxID:   [id number] CRESTOR 10 MG TABS (ROSUVASTATIN CALCIUM) Take one tablet by mouth daily.  #90 x 3   Entered by:   Ollen Gross, RN, BSN  Authorized by:   Colon Branch, MD, [facility]   Signed by:   Ollen Gross, RN, BSN on 06/12/2009   Method used:   Faxed to ...       [facility] (mail-order)       [address]       [place], [place]  [address]  [place]       Ph:        Fax: [phone]   RxID:   [id number]

## 2010-06-26 NOTE — Progress Notes (Signed)
Summary: clarify crestor  Phone Note From Pharmacy   Caller: amy from [facility] [phone] order reference # [id number] Request: Speak with Nurse Details for Reason: clarify crestor by md.  Initial call taken by: Lorne Skeens,  June 20, 2009 3:53 PM  Follow-up for Phone Call        Phone call completed Deliah Goody, RN  June 20, 2009 4:58 PM

## 2010-06-26 NOTE — Assessment & Plan Note (Signed)
Summary: 1st Hep B Injection/scm  Nurse Visit   Allergies: 1)  ! * Hctz 2)  ! * Neomycin Sulfate 3)  ! * Lidocaine 4)  ! * Sulfa and Sulfates  Immunizations Administered:  Hepatitis B Vaccine # 1:    Vaccine Type: HepB Adult    Site: right deltoid    Mfr: Merck    Dose: 1.0 ml    Route: IM    Given by: Doristine Devoid    Exp. Date: 02/05/2011    Lot #: [id number]  Orders Added: 1)  Hepatitis B Vaccine >19yrs [90746] 2)  Admin 1st Vaccine [82956]

## 2010-06-26 NOTE — Assessment & Plan Note (Signed)
Summary: POISON OAK/ IVY?--OK PR CHEMIRA///SPH   Vital Signs:  Patient profile:   50 year old male Weight:      222 pounds Pulse rate:   60 / minute BP sitting:   126 / 80  (left arm)  Vitals Entered By: Doristine Devoid (October 30, 2009 3:21 PM) CC: poison ivy x1 week arms, legs, and waist   History of Present Illness: Charles [name] here today for poison ivy.  was working in yard last weekend and developed rash mid week.  continues to spread.  some vesicles/blisters.  has washed everything he was wearing that weekend.  was using Hydrocortisone 1% and then Aveeno anti-itch cream w/ temporary results.  both arms and R leg are affected.  Current Medications (verified): 1)  Folic Acid 1 Mg  Tabs (Folic Acid) .Marland Kitchen.. 1po Once Daily 2)  Flonase 50 Mcg/act  Susp (Fluticasone Propionate) .... Use As Directed 3)  Toprol Xl 100 Mg Xr24h-Tab (Metoprolol Succinate) .Marland Kitchen.. 1 By Mouth Once Daily 4)  Fluoxetine Hcl 10 Mg  Caps (Fluoxetine Hcl) .... Take 1 Every Morning 5)  Clobetasol Propionate E 0.05 %  Crea (Clobetasol Prop Emollient Base) .... Use As Directed 6)  Allopurinol 300 Mg  Tabs (Allopurinol) .Marland Kitchen.. 1 By Mouth Qd 7)  Fish Oil   Oil (Fish Oil) .Marland Kitchen.. 1 By Mouth Two Times A Day 8)  Fluocinonide Topical Sol 0.05% 60ml .... Daily 9)  Fluocinonide Cream 0.05% 60gram .... Daily 10)  Multivitamin 11)  Aspirin 325 Mg Tabs (Aspirin) .Marland Kitchen.. 1 By Mouth Once Daily 12)  Nitrostat 0.3 Mg Subl (Nitroglycerin) .Marland Kitchen.. 1 As Needed Chest Pain 13)  Crestor 10 Mg Tabs (Rosuvastatin Calcium) .... Take One Tablet By Mouth Daily. 14)  Amoxicillin 500 Mg Caps (Amoxicillin) .Marland Kitchen.. 1 By Mouth Once Daily Post Dental Procedure. About 3 Days Left As of 02/14/2009 15)  Prednisone 20 Mg Tabs (Prednisone) .... 2 Tabs Daily X14 Days  Allergies (verified): 1)  ! * Hctz 2)  ! * Neomycin Sulfate 3)  ! * Lidocaine 4)  ! * Sulfa and Sulfates  Review of Systems      See HPI  Physical Exam  General:  well-nourished,in no acute distress;  alert,appropriate and cooperative throughout examination Skin:  vesicular rash on forearms, hands, and L lower leg.  + excoriations.  no signs of infxn.   Impression & Recommendations:  Problem # 1:  CONTACT DERMATITIS (ICD-692.9) Assessment New no relief w/ OTC anti-itch creams and pt feels it is still spreading.  start oral Prednisone- 2 weeks burst to decrease risk of rebound dermatitis.  reviewed supportive care and red flags that should prompt return.  Pt expresses understanding and is in agreement w/ this plan. His updated medication list for this problem includes:    Clobetasol Propionate E 0.05 % Crea (Clobetasol prop emollient base) ..... Use as directed    Prednisone 20 Mg Tabs (Prednisone) .Marland Kitchen... 2 tabs daily x14 days  Complete Medication List: 1)  Folic Acid 1 Mg Tabs (Folic acid) .Marland Kitchen.. 1po once daily 2)  Flonase 50 Mcg/act Susp (Fluticasone propionate) .... Use as directed 3)  Toprol Xl 100 Mg Xr24h-tab (Metoprolol succinate) .Marland Kitchen.. 1 by mouth once daily 4)  Fluoxetine Hcl 10 Mg Caps (Fluoxetine hcl) .... Take 1 every morning 5)  Clobetasol Propionate E 0.05 % Crea (Clobetasol prop emollient base) .... Use as directed 6)  Allopurinol 300 Mg Tabs (Allopurinol) .Marland Kitchen.. 1 by mouth qd 7)  Fish Oil Oil (Fish oil) .Marland KitchenMarland KitchenMarland Kitchen  1 by mouth two times a day 8)  Fluocinonide Topical Sol 0.05% 60ml  .... Daily 9)  Fluocinonide Cream 0.05% 60gram  .... Daily 10)  Multivitamin  11)  Aspirin 325 Mg Tabs (Aspirin) .Marland Kitchen.. 1 by mouth once daily 12)  Nitrostat 0.3 Mg Subl (Nitroglycerin) .Marland Kitchen.. 1 as needed chest pain 13)  Crestor 10 Mg Tabs (Rosuvastatin calcium) .... Take one tablet by mouth daily. 14)  Amoxicillin 500 Mg Caps (Amoxicillin) .Marland Kitchen.. 1 by mouth once daily post dental procedure. about 3 days left as of 02/14/2009 15)  Prednisone 20 Mg Tabs (Prednisone) .... 2 tabs daily x14 days  Patient Instructions: 1)  Take the prednisone as directed- take w/ food and early in the day to minimize side effects 2)   Continue the anti-itch creams as needed 3)  If you develops pus or any signs of infections- please call! 4)  Hang in there! Prescriptions: PREDNISONE 20 MG TABS (PREDNISONE) 2 tabs daily x14 days  #28 x 0   Entered and Authorized by:   Neena Rhymes MD   Signed by:   Neena Rhymes MD on 10/30/2009   Method used:   Electronically to        [facility] (retail)       [address]       [place], [place]  [address]       Ph: [phone] or [phone]       Fax: [phone]   RxID:   [id number]

## 2010-07-25 ENCOUNTER — Ambulatory Visit (INDEPENDENT_AMBULATORY_CARE_PROVIDER_SITE_OTHER): Payer: 59

## 2010-07-25 ENCOUNTER — Encounter: Payer: Self-pay | Admitting: Internal Medicine

## 2010-07-25 DIAGNOSIS — Z111 Encounter for screening for respiratory tuberculosis: Secondary | ICD-10-CM

## 2010-08-02 NOTE — Assessment & Plan Note (Signed)
Summary: tb test fo r work/cbs  Nurse Visit   Allergies: 1)  ! * Hctz 2)  ! * Neomycin Sulfate 3)  ! * Lidocaine 4)  ! * Sulfa and Sulfates  Immunizations Administered:  PPD Skin Test:    Vaccine Type: PPD    Site: left forearm    Mfr: Sanofi Pasteur    Dose: 0.1 ml    Route: ID    Given by: Jeremy Johann CMA    Exp. Date: 08/08/2012    Lot #: [id number]  PPD Results    Date of reading: 07/27/2010    Results: < 5mm    Interpretation: negative  Orders Added: 1)  TB Skin Test [86580] 2)  Admin 1st Vaccine [95284]

## 2010-08-14 ENCOUNTER — Encounter: Payer: Self-pay | Admitting: Internal Medicine

## 2010-08-14 ENCOUNTER — Ambulatory Visit (INDEPENDENT_AMBULATORY_CARE_PROVIDER_SITE_OTHER): Payer: 59 | Admitting: Internal Medicine

## 2010-08-14 VITALS — BP 114/70 | HR 72 | Wt 227.0 lb

## 2010-08-14 DIAGNOSIS — F410 Panic disorder [episodic paroxysmal anxiety] without agoraphobia: Secondary | ICD-10-CM

## 2010-08-14 MED ORDER — FLUOXETINE HCL 20 MG PO CAPS: 20.0000 mg | ORAL_CAPSULE | Freq: Every day | ORAL | Status: DC

## 2010-08-14 NOTE — Progress Notes (Signed)
Addended byMarga Melnick on: 08/14/2010 05:37 PM   Modules accepted: Level of Service

## 2010-08-14 NOTE — Patient Instructions (Signed)
Collect urine for 24 hours for metanephrines and catecholamines. Results will be mailed to you. Take 2 of the fluoxetine 10 mg pills daily until your present supply is completed. At that point a 20 mg pill daily will be initiated.

## 2010-08-14 NOTE — Progress Notes (Signed)
  Subjective:    Patient ID: Charles [name], male    DOB: 23-Feb-1961, 50 y.o.   MRN: [id number]  HPI  He feels he has had symptoms for several months. Initially this was a tremor in his legs. In 2008 he was evaluated for anxiety. It was postulated that he had serotonin deficiency.  At that time generic Prozac 10 mg daily was prescribed. He has  continued this since that time. He also has a prescription for clonazepam which he has not used.   Approximately 2 weeks ago while working out of town he awoke early in the morning. He "jolted " out of bed. He had associated sweating but he denied associated headache, chest pain, palpitations or diarrhea. He was able to go to sleep approximately 2 hours later. 3 nights later he had a similar episode while at home.  He had a third episode last night prompting this office visit.    Possible triggers for this event include increased work stress. Specifically there've been layoffs at his job duties have increased significantly. Additionally his job has required him to be out of town intermittently.    His sister has depression and is actively being treated.    Review of Systems  He denies blurred vision or double vision. There've been no changes in his hair skin or nail changes except for Psoriasis. He has some heat intolerance.     Objective:   Physical Exam   On exam he is in no acute distress. Affect and mood are normal with normal interaction. Extraocular motions are intact without lid lag. Thyroid is normal to palpation. Skin is warm and dry. He does have scattered psoriatic lesions. An S4 is noted. Deep tendon reflexes are normal. He exhibits no tremor. There is no  onycholysis of the nailbeds. All pulses are intact and there is no edema. Abdomen soft with no organomegaly or masses. There is no lymphadenopathy about the head neck or axilla.        Assessment & Plan:   #1 history is suggestive of panic in the setting of serotonin deficiency. The  trigger would appear to be work stress.    plan #1 thyroid function test will be collected. The fluoxetine will be increased to 20 mg daily. 24 hour urine for catecholamines and metanephrines will be collected as well to rule out pheochromocytoma. The pathophysiology of neurotransmitter deficiency was discussed. I stressed that we must rule out conditions such as hyperthyroidism or pheochromocytoma.

## 2010-08-15 LAB — T4, FREE: Free T4: 0.79 ng/dL (ref 0.60–1.60)

## 2010-08-15 LAB — T3, FREE: T3, Free: 2.5 pg/mL (ref 2.3–4.2)

## 2010-08-15 LAB — TSH: TSH: 1.07 u[IU]/mL (ref 0.35–5.50)

## 2010-08-20 ENCOUNTER — Other Ambulatory Visit: Payer: Self-pay | Admitting: Internal Medicine

## 2010-08-25 LAB — CATECHOLAMINES, FRACTIONATED, URINE, 24 HOUR
Calculated Total (E+NE): 24 mcg/24 h — ABNORMAL LOW (ref 26–121)
Dopamine, 24 hr Urine: 50 mcg/24 h — ABNORMAL LOW (ref 52–480)
Norepinephrine, 24 hr Ur: 24 mcg/24 h (ref 15–100)
Total Volume - CF 24Hr U: 3000 mL

## 2010-08-25 LAB — METANEPHRINES, URINE, 24 HOUR
Metaneph Total, Ur: 166 mcg/24 h — ABNORMAL LOW (ref 224–832)
Metanephrines, Ur: 30 mcg/24 h — ABNORMAL LOW (ref 90–315)
Normetanephrine, 24H Ur: 136 mcg/24 h (ref 122–676)

## 2010-09-21 ENCOUNTER — Encounter: Payer: Self-pay | Admitting: Cardiovascular Disease

## 2010-09-25 ENCOUNTER — Ambulatory Visit (INDEPENDENT_AMBULATORY_CARE_PROVIDER_SITE_OTHER): Payer: 59 | Admitting: Cardiovascular Disease

## 2010-09-25 ENCOUNTER — Encounter: Payer: Self-pay | Admitting: Cardiovascular Disease

## 2010-09-25 DIAGNOSIS — I10 Essential (primary) hypertension: Secondary | ICD-10-CM

## 2010-09-25 DIAGNOSIS — I2510 Atherosclerotic heart disease of native coronary artery without angina pectoris: Secondary | ICD-10-CM

## 2010-09-25 DIAGNOSIS — E785 Hyperlipidemia, unspecified: Secondary | ICD-10-CM

## 2010-09-25 NOTE — Assessment & Plan Note (Signed)
Borderline, lifestyle mods discussed.  F/U 3 months to see if ACE needs to be started

## 2010-09-25 NOTE — Progress Notes (Signed)
Charles [name] is seen today in F/U for CAD.  He had a stent to the LAD in 2009.  He had a stress myovue in July of 2010.  It was normal with no ischemia. and an EF of 65%.  He is not having chest pain, palpitaotns, PND or orthopnea.  His LDL has consistantly been around 150.  We started him on Crestor 5 mg which he has tolerated.  He had a lipomed profile per [place].  I reviewed these results with him.  Particle number was quite high at 2219 (goal less than 1300).  HDL was 41 and LDL was 88.  Crestor increased to 10 mg last visit  He knows he needs to follow a low carb diet better.  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    BP has been high and I told him we would have to start and ACE inhibitor if he could not lose weight and adjust his diet  Under a lot of stress at work and has to drive 0,[id number] / month.    Son graduating this year and going to [employer].  Wife working to supplement income but not happy at job.  Looking forward to rollercoaster rides this summer  ROS: Denies fever, malais, weight loss, blurry vision, decreased visual acuity, cough, sputum, SOB, hemoptysis, pleuritic pain, palpitaitons, heartburn, abdominal pain, melena, lower extremity edema, claudication, or rash.   General: Affect appropriate Healthy:  appears stated age HEENT: normal Neck supple with no adenopathy JVP normal no bruits no thyromegaly Lungs clear with no wheezing and good diaphragmatic motion Heart:  S1/S2 no murmur,rub, gallop or click PMI normal Abdomen: benighn, BS positve, no tenderness, no AAA no bruit.  No HSM or HJR Distal pulses intact with no bruits No edema Neuro non-focal Skin warm and dry No muscular weakness   Current Outpatient Prescriptions  Medication Sig Dispense Refill  . allopurinol (ZYLOPRIM) 300 MG tablet Take 300 mg by mouth daily.        Marland Kitchen aspirin 325 MG tablet Take 325 mg by mouth daily.        . clobetasol (TEMOVATE) 0.05 % cream  Apply 1 application topically 2 (two) times daily. As needed       . fluocinonide (LIDEX) 0.05 % cream Apply 1 application topically 2 (two) times daily. As needed       . fluocinonide (LIDEX) 0.05 % external solution Apply 1 application topically 2 (two) times daily. As needed       . FLUoxetine (PROZAC) 20 MG capsule Take 1 capsule (20 mg total) by mouth daily.  90 capsule  3  . fluticasone (FLONASE) 50 MCG/ACT nasal spray 2 sprays by Nasal route daily.        . folic acid (FOLVITE) 1 MG tablet Take 1 mg by mouth daily.        . metoprolol (TOPROL-XL) 100 MG 24 hr tablet Take 100 mg by mouth daily.        . Multiple Vitamin (MULTIVITAMIN) capsule Take 1 capsule by mouth daily.        . nitroGLYCERIN (NITROSTAT) 0.3 MG SL tablet Place 0.3 mg under the tongue every 5 (five) minutes as needed.        . Omega-3 Fatty Acids (FISH OIL) 1000 MG CAPS Take 1,000 mg by mouth 2 (two) times daily.        . rosuvastatin (CRESTOR) 10 MG tablet Take 10 mg by mouth daily.  Allergies  Sulfa drugs cross reactors; Hctz; Lidocaine; and Neomycin sulfate  Electrocardiogram:  NSR 66 Normal ECG  Assessment and Plan

## 2010-09-25 NOTE — Patient Instructions (Signed)
Your physician recommends that you schedule a follow-up appointment in: 3 MONTHS WITH DR [facility]  Your physician recommends that you continue on your current medications as directed. Please refer to the Current Medication list given to you today.  Your physician has requested that you regularly monitor and record your blood pressure readings at home. Please use the same machine at the same time of day to check your readings and record them to bring to your follow-up visit.

## 2010-09-25 NOTE — Assessment & Plan Note (Signed)
Stable with no angina and good activity level.  Continue medical Rx  

## 2010-09-25 NOTE — Assessment & Plan Note (Signed)
Continue crestor at current dose. Lipomed per Dr Alwyn Ren

## 2010-10-09 NOTE — Cardiovascular Report (Signed)
NAME:  Charles [name], Charles [name]             ACCOUNT NO.:  [id number]   MEDICAL RECORD NO.:  [id number]          PATIENT TYPE:  INP   LOCATION:  6527                         FACILITY:  [facility]   PHYSICIAN:  [name]. [name], MD, FACCDATE OF BIRTH:  [birth year]   DATE OF PROCEDURE:  DATE OF DISCHARGE:                            CARDIAC CATHETERIZATION   This is a 51 year old patient with unstable anginal symptoms.  Cardiac  cath was done to define coronary anatomy.   Cine catheterization was done with a 4-French sheath from the right  femoral artery.   A JL-4 catheter was used to engage the left main.  Williams catheter was  used to engage the right coronary artery.   Left main coronary was normal.   The patient had a 95% discrete stenosis in the proximal LAD just at the  takeoff of the first septal perforator.  Mid and distal LAD were normal.  There was a normal TIMI flow.  First diagonal branch took off just  distal to the stenosis and was normal.   The circumflex coronary artery was nondominant and normal.   The patient had a large right dominant coronary artery, which was  normal.  There were no right-to-left collaterals.   RAO ventriculography.  RAO ventriculography was normal.  EF was 65%.  There was no gradient across the aortic valve and no MR.  LV pressure is  121/14.  Aortic pressure is 120/75.   IMPRESSION:  The patient was given Plavix last night at 300 mg and took  300 mg this morning.  The films were reviewed by Dr. Juanda Chance.  He will be  transferred to the Inpatient Lab for angioplasty and stenting of the  proximal left anterior descending.   The risks were discussed with the patient and his wife including a less  than 5% risk of needing emergent open-heart surgery if he has a failed  percutaneous coronary intervention.      [name]. [name], MD, [facility]  Electronically Signed     PCN/MEDQ  D:  05/13/2008  T:  05/13/2008  Job:  [id number]

## 2010-10-09 NOTE — Discharge Summary (Signed)
NAME:  Charles [name], Charles [name]             ACCOUNT NO.:  [id number]   MEDICAL RECORD NO.:  [id number]          PATIENT TYPE:  INP   LOCATION:  6527                         FACILITY:  [facility]   PHYSICIAN:  Jesse Sans. Wall, MD, FACCDATE OF BIRTH:  19-Apr-1961   DATE OF ADMISSION:  05/13/2008  DATE OF DISCHARGE:  05/14/2008                               DISCHARGE SUMMARY   PRIMARY CARDIOLOGIST:  Theron Arista C. [name], MD, [facility]   PRIMARY CARE PHYSICIAN:  Peyton Najjar, MD   DISCHARGE DIAGNOSIS:  Unstable angina.   SECONDARY DIAGNOSES:  1. Coronary artery disease status post successful PCI and stenting of      the proximal LAD with placement of 2.75 x 18 mm  XIENCE V drug-eluting stent.  1. Hypertension.  2. Hyperlipidemia.  3. Gout.  4. Psoriasis.   ALLERGIES:  The patient developed anaphylaxis with NEOMYCIN and  LIDOCAINE topically in the past.   PROCEDURES:  Left heart cardiac catheterization and successful PCI and  stenting of the LAD as above.   HISTORY OF PRESENT ILLNESS:  A 50 year old Caucasian male with recent  history of unstable exertional angina for which she underwent an  adenosine Myoview revealing anterior septal wall ischemia with normal LV  function.  He subsequently saw Dr. [name] in clinic on May 12, 2008  and was set up for left heart cardiac catheterization.   HOSPITAL COURSE:  The patient underwent left heart cardiac  catheterization revealing a 95% stenosis in the proximal LAD and,  otherwise has had normal coronary arteries and normal LV function with  an EF of 65%.  The patient subsequently underwent PCI and stenting of  the LAD with placement of 2.7 x 18 mm XIENCE V drug-eluting stent.  The  patient tolerated the procedure well.  Postprocedure, has been  ambulating without recurrent symptoms or limitations.  He has been  evaluated by Cardiac Rehab and outpatient referral has been made.  He  will be discharged home today in good condition.   DISCHARGE LABS:   Hemoglobin 13.8, hematocrit 42.0, WBC 8.5, platelets  269, MCV 92.2.  Sodium 137, potassium 4.0, chloride 104, CO2 of 27, BUN  10, creatinine 0.93, glucose 89, calcium 8.4.   DISPOSITION:  The patient will be discharged home today in good  condition.   FOLLOWUP APPOINTMENTS:  We will arrange for followup with Dr. [name] in  approximately 2 weeks.  He will also follow up with Dr. Alwyn Ren as  previously scheduled.   DISCHARGE MEDICATIONS:  1. Aspirin 325 mg daily.  2. Plavix 75 mg daily.  3. Multivitamin daily.  4. Toprol-XL 50 mg daily.  5. Allopurinol 100 mg daily.  6. Fish oil 2000 mg daily.  7. Amlodipine 5 mg daily.  8. Prozac 10 mg daily.  9. Folic acid 1 mg daily.  10.Ranitidine 150 mg daily.  11.Nitroglycerin 0.4 mg sublingual p.r.n. chest pain.   OUTSTANDING LAB STUDIES:  None.   DURATION OF DISCHARGE/ENCOUNTER:  Forty minutes including physician  time.      Nicolasa Ducking, ANP      Jesse Sans. Daleen Squibb, MD, [facility]  Electronically Signed  CB/MEDQ  D:  05/14/2008  T:  05/14/2008  Job:  [id number]   cc:   Peyton Najjar, MD

## 2010-10-09 NOTE — Cardiovascular Report (Signed)
NAME:  Charles [name], Charles [name]             ACCOUNT NO.:  [id number]   MEDICAL RECORD NO.:  [id number]           PATIENT TYPE:   LOCATION:                                 FACILITY:   PHYSICIAN:  Bruce R. Juanda Chance, MD, FACCDATE OF BIRTH:  1960-10-20   DATE OF PROCEDURE:  05/13/2008  DATE OF DISCHARGE:                            CARDIAC CATHETERIZATION   CLINICAL HISTORY:  [name] is 50 year old and recently has had  symptoms of exertional chest pain.  He had a Myoview scan which showed  anterior ischemia, and it was studied in the JV lab today by Dr. [name]  who found a 95% lesion in the proximal LAD.  We brought him upstairs for  intervention.  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  He has a history of hypertension.   PROCEDURE:  The patient came up to the JV lab, and we exchanged the 6-  [place] sheath for a 7-French sheath because the lesion involved the  bifurcation of a large diagonal branch.  The patient was given Angiomax  bolus and infusion and has been loaded with Plavix and chewable aspirin.   Our initial plan was to wire both lesions and stent just before the  bifurcation.  After doing predilatations, we felt there was disease  beyond the diagonal that needed to be treated and so we elected to place  a stent in the LAD across the large diagonal branch.  We predilated the  lesion first with a 2.25 x 15 mm Apex balloon and then with a 2.75 x 15  Apex balloon.  After dilating with first balloon, the patient developed  a slow flow, and we passed the stent.  We did not get any flow, so we  went back and dilated with a larger balloon.  We dilated the 2.75 x 15  balloon up to 8 atmospheres for 30 seconds.  We then decided to deploy a  2.75 x 18 mm Xience stent crossing the diagonal branch.  We deployed  this with 1 inflation of 12 atmospheres for 30 seconds.  We then  postdilated the stent with a 3.25 x 15 mm  Voyager performing 2  inflations at 16 atmospheres for 30 seconds.  Final diagnostics were  then performed through the guiding catheter.  The patient tolerated the  procedure well and left the laboratory in satisfactory condition.   RESULTS:  Initially, the stenosis was located in the proximal LAD, just  before the bifurcation of a large diagonal branch.  It was 95% narrowed.  There was also about 40-50% narrowing in the LAD, just distal to the  diagonal branch.  Following stenting, this stenosis improved from 95%-  0%.  The diagonal branch was not compromised with less than 40%  narrowing at the ostium.   CONCLUSION:  Successful PCI of the lesion in the proximal LAD using a  Xience drug-eluting stent with improvement in central narrowing from 95%-  0%.   DISPOSITION:  The patient returned to post angio room for further  observation.      Bruce Elvera Lennox Juanda Chance, MD,  [facility]  Electronically Signed     BRB/MEDQ  D:  05/13/2008  T:  05/13/2008  Job:  [id number]   cc:   Titus Dubin. Alwyn Ren, MD,FACP,FCCP  [name]. [name], MD, [facility]

## 2010-10-09 NOTE — Assessment & Plan Note (Signed)
[facility]                            CARDIOLOGY OFFICE NOTE   ANTONI, STEFAN                      MRN:          [id number]  DATE:08/03/2008                            DOB:          Sep 06, 1960    Mr. Elmore returns today in for followup.  He had a stent in his LAD by  Dr. Juanda Chance in December.  He has been doing very well.  He continues to  be somewhat anxious.  He is walking on tread climber and exercising on a  regular basis.  He occasionally gets some anxiety and get some mild  squeezing in his chest, I do not think this is angina.  He tends to  notice more symptoms in his chest at rest than anything else.   He also has some postural signs when he gets out of a car and stands up  quickly, this actually preceded his stenting.   Risk factors are well modified.  He has hypertension and  hypercholesterolemia.   REVIEW OF SYSTEMS:  Otherwise, negative.  He has not had any significant  frank syncope.  No palpitations.  No lower extremity edema.  No  exertional chest pain.  No weight loss.  No fever.  No melena.  No  bleeding diathesis or easy bruising.   MEDICATIONS:  1. Folic acid.  2. Toprol 100 a day.  3. Fluoxetine 10 a day.  4. Allopurinol 300 a day.  5. Fish oil.  6. Multivitamins.  7. Ranitidine 150 a day.  8. Aspirin.  9. Plavix 75 a day.   ALLERGIES:  He is allergic to HYDROCHLOROTHIAZIDE, NEOMYCIN, LIDOCAINE,  and SULFA.   PHYSICAL EXAMINATION:  VITAL SIGNS:  Blood pressure 130/70 not postural,  pulse 70 and regular, respiratory rate 14, and afebrile.  HEENT:  Unremarkable.  NECK:  Carotids are normal without bruit.  No lymphadenopathy,  thyromegaly, or JVP elevation.  LUNGS:  Clear.  Good diaphragmatic motion.  No wheezing.  CARDIAC:  S1 and S2.  Normal heart sounds.  PMI normal.  ABDOMEN:  Benign.  Bowel sounds positive.  No AAA, no tenderness, no  bruit, no hepatosplenomegaly, or hepatojugular reflux.  EXTREMITIES:  Distal pulses are intact.  No edema.  NEUROLOGIC:  Nonfocal.  SKIN:  Warm and dry.  MUSCULOSKELETAL:  No muscular weakness.   IMPRESSION:  1. Coronary artery disease, stent to the left anterior descending      (coronary artery).  Continue aspirin and Plavix.  Follow up Myoview      in June.  2. Anxiety.  Continue supportive measures.  She is having a hard time      getting used to the fact that he has coronary artery disease.  He      does not need selective serotonin reuptake inhibitor or Xanax at      this time.  3. Hypertension, currently well controlled.  Continue current dose of      medicine including higher dose Toprol.  4. Cholesterol.  I will have to go back and look, it does not appear      that he is on  a statin.  He probably needs to be on one if his LDL      has not been checked in last 6 months.  I will recheck and then      start him on statin so long as his LDL is greater than 100.  5. History of gout.  Continue allopurinol.  6. History of reflux.  Avoid omeprazole.  Consider Protonix      prescription needed otherwise p.r.n., Maalox or ranitidine.   I will see him back in June when he has a stress test.     Theron Arista C. [name], MD, [facility]  Electronically Signed    PCN/MedQ  DD: 08/03/2008  DT: 08/03/2008  Job #: [id number]

## 2010-10-09 NOTE — Assessment & Plan Note (Signed)
[facility]                            CARDIOLOGY OFFICE NOTE   SAHIL, MILNER                      MRN:          [id number]  DATE:06/07/2008                            DOB:          05/23/1961    Mr. Okazaki returns today for followup.  He presented with unstable  angina and had a 2.75 x 18-mm stent placed by Dr. Juanda Chance to the proximal  LAD.  His lesion was very tight, essentially occluding with wire  passage.   The patient has had resolution of his chest burning since the time of  his angioplasty.  He is getting occasional twinges in his chest with  some musculoskeletal.  It has been quite a bit of time with Molly Maduro  today.  He obviously has some psychological issues with the whole  experience of being diagnosed with coronary artery disease and requiring  an intervention.   He has not gone back to full exercise yet.  He has been working part  time.  He had a bunch of questions, a lot of them revolved around work.  I told him he could go back to work full time.  He asked about traveling  to [place] for 2 weeks and I told him this would be fine.  He also asked  about his amlodipine.  Dr. Alwyn Ren had started him on this medication  when he was having chest pain.  I did not think a calcium blocker was  the best medicine for him and started him on Toprol 50 mg a day.  I  suspect that he can tolerate more of this and we may increase it in  regards to his pulse and blood pressure now that we know he has  documented coronary artery disease.   He otherwise has been doing well.  He has not had any significant PND or  orthopnea.  He has not had to take any nitroglycerin which he is  carrying with him.  He has had no lower extremity edema.   His current medications include:  1. Folic acid.  2. Toprol 50 mg a day to be increased to 100.  3. Allopurinol 300 a day.  4. Fish oil.  5. Multivitamins.  6. Amlodipine has been stopped.  7. An aspirin a  day.  8. Plavix 75 a day.   PHYSICAL EXAMINATION:  GENERAL:  Remarkable for somewhat anxious,  overweight white male in no distress.  VITAL SIGNS:  His blood pressure is 140/80, pulse 88 and regular,  respiratory rate 14, afebrile, and weight 221.  HEENT:  Unremarkable.  NECK:  Carotids are normal without bruit.  No lymphadenopathy,  thyromegaly, or JVP elevation.  LUNGS:  Clear.  Good diaphragmatic motion.  No wheezing.  S1 and S2 with  heart sounds.  PMI normal.  ABDOMEN:  Benign.  Bowel sounds positive.  No AAA.  No tenderness.  No  bruit.  No hepatosplenomegaly, hepatojugular reflux, or tenderness.  Groin is well healed without residual bruit.  EXTREMITIES:  Distal pulses are intact.  No edema.  NEUROLOGIC:  Nonfocal.  SKIN:  Warm and dry.  MUSCULOSKELETAL:  No muscular weakness.   We also went over his Myoview study.  I was actually a little bit  surprised that his Myoview study was barely abnormal given the tight  nature of his lesion.  This makes me a little bit skeptical about  following his coronary artery disease by functional imaging in the  future.  Fortunately, he appears to have a good warning sign with fairly  classic anginal symptoms.   IMPRESSION:  1. Coronary artery disease, recent stent to the left anterior      descending artery.  Continue aspirin and Plavix.  2. Hypertension and relative tachycardia in the setting of recent      stent.  Increase Toprol to 100 mg a day.  3. Hyperlipidemia in the setting of coronary artery disease.  We will      recheck his lipid and liver.  He should probably be on a statin      drug, which I do not see as part of his medication list.  4. History of reflux.  Continue ranitidine.  Encourage weight loss.  5. History of gout.  Continue allopurinol.  Follow up uric acid on a      yearly basis.   Overall, I think Daunte is doing well physically.  He is making some  slow progress in regards to his psychological status of having  coronary  artery disease.     [name]. [name], MD, [facility]  Electronically Signed    PCN/MedQ  DD: 06/07/2008  DT: 06/08/2008  Job #: [id number]

## 2010-10-09 NOTE — Assessment & Plan Note (Signed)
[facility]                            CARDIOLOGY OFFICE NOTE   NAME:Charles [name], Charles [name]                      MRN:          [id number]  DATE:05/12/2008                            DOB:          March 29, 1961    [name] is a pleasant 50 year old patient referred by Dr. Alwyn Ren  for question new-onset angina.  [name] has no documented history  of coronary artery disease.  He had been having new-onset chest pain  starting at the beginning of November.  He clearly indicates getting  chest burning when he uses some of his [employer] and exercise equipment at  home.  It is reproducible with exertion, it goes away with rest.  He had  a Myoview study, which suggested mild anterior septal wall ischemia, his  EF was normal.   He actually received adenosine and did not exercise.   I actually reviewed the patient's images, they were not particularly  high risk, however, in talking to him, his clinical syndrome sounds  impressive.  He clearly is having burning in his chest, which is related  to exercise.  It is occurring after only 3-4 minutes of exercise, it is  reproducible at the same workload.   He previously has not had a marked history of reflux, GERD, or any other  GI problems.   He is not having rest pain.   While I was talking to the patient in room, he appeared to get somewhat  vagal.  We had to put his head down and finish the interview with his  legs elevated, his systolic pressure was about 90.  He was not  diaphoretic.   PAST MEDICAL HISTORY:  Remarkable for hypertension and hyperlipidemia.  His past medical history, otherwise shows anaphylaxis with a combination  of neomycin and lidocaine topically in 91.  I suspect this was more  problem with neomycin.  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  Later, he has a history of gout, hyperlipidemia, hypertension,  and psoriasis.  He has had previous ganglionectomy and bunionectomy.   FAMILY HISTORY:  Remarkable for father having hypertension and CVA.  Mother having lung cancer.   The patient is originally from [place].  He works for  [employer].  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  He is married.  I met his wife today.  He has one older  child.  He tries to excise on a regular basis and does not use alcohol  and does not smoke.   CURRENT MEDICATIONS:  1. Toprol 50 a day.  2. Allopurinol 300 a day.  3. Aspirin a day.  4. Ranitidine 150 b.i.d.  5. Amlodipine 5 a day.  6. We will start Plavix 300 tonight and 300 in the morning.   PHYSICAL EXAMINATION:  GENERAL:  Remarkable for a somewhat anxious male  in no distress.  VITAL SIGNS:  His weight is 220, blood pressure is currently 120/70,  pulse 70 and regular, respiratory rate 14, and afebrile.  HEENT:  Unremarkable.  NECK:  Carotids normal, without bruit.  No lymphadenopathy, thyromegaly,  or JVP elevation.  LUNGS:  Clear.  Good diaphragmatic motion.  No wheezing.  S1 and S2 with  normal heart sounds.  PMI normal.  ABDOMEN:  Benign.  Bowel sounds positive.  No AAA, no tenderness, no  bruit, no hepatosplenomegaly, no hepatojugular reflux.  EXTREMITIES:  Distal pulses are intact.  No edema.  NEUROLOGIC:  Nonfocal.  SKIN:  Warm and dry.  MUSCULOSKELETAL:  No muscular weakness.   Baseline EKG is normal.   IMPRESSION:  1. New onset chest pain in the setting of exertion with an abnormal      Myoview suggesting intraseptal ischemia.  Diagnostic heart      catheterization will be scheduled for tomorrow morning.  The      patient will be loaded with Plavix.  Risk including stroke,      bleeding, anaphylaxis need for emergency surgery were discussed      with both the patient and his wife.  They are willing to proceed      since he has a question allergy to LIDOCAINE, we will probably use      Marcaine, although from his clinical history, I think, the issue      was with  neomycin.  2. Hypertension, currently well controlled.  Continue current dose of      amlodipine and Toprol.  3. Gout.  Continue allopurinol and try to avoid diuretics.  4. Question chest burning related to reflux.  Continue ranitidine.  5. Vagal reaction in the office.  The patient is doing better.  We are      actually able to draw his blood with him lying down and he did not      have any exacerbation.  His wife will drive him to home today.   Prior to his catheterization, we will make sure he has good sedation.  We will hydrate him with a liter of normal saline.   Again, we will try to use Marcaine or some other non lidocaine  derivative for his local anesthetic.     [name]. [name], MD, [facility]  Electronically Signed    PCN/MedQ  DD: 05/12/2008  DT: 05/13/2008  Job #: [id number]   cc:   Dr. Alwyn Ren

## 2010-10-12 NOTE — Assessment & Plan Note (Signed)
[facility]                        [facility] OFFICE NOTE   NAME:JASINSKIPasqual, [name]                      MRN:          [id number]  DATE:06/18/2006                            DOB:          June 12, 1960    Charles [name] was seen June 18, 2006 with what appears to be a  panic episode associated with marked anxiety, tachycardia.  He denied  chest pain, flushing, headache or diarrhea.  His sister is on  fluoxetine.   He could not describe any specific triggers; his job is being  reorganized but he received a hefty bonus based on his performance.   He also had symptoms with rhinosinusitis and dyspepsia.   Weight was stable; he was afebrile.  Pulse was 60 and regular,  respiratory rate 17.  Blood pressure was 122/88.  There was mild erythema in the oropharynx.  He had no lymphadenopathy.  NEURO/PSYCHIATRIC:  Documented anxiety.   Clonazepam 0.5 one-half to 1 at bedtime was recommended along with  fluoxetine 10 mg daily.  The fluoxetine will be advanced as tolerated.   Studies revealed normal CBC and differential, A1C, thyroid function  tests, potassium, magnesium, calcium and liver function tests.  His gout  was also reassessed and uric acid is ideal at 5.3.  His allopurinol was  renewed.   Total cholesterol was 207, his triglycerides was still 201 with an LDL  of 131. It was recommended carbohydrate restriction and avoidance  totally of all high fructose corn syrup products.  The lipids will be  repeated in 6 months.  If the values are not at goal, then an agent such  as Tricor will be initiated.   He did call back the same day at the office stating that he tried to go  asleep but could not because of anxiety.  I recommended he take the  clonazepam 0.5 one half to 1 every 8 to 12 hours as needed.  If the  symptoms persist, then referral to a psychologist will be pursued.Twenty  four hour urine collection for catecholamines & metanephrines  will be  considered if his symptoms fail to respond to these interventions.     Titus Dubin. Alwyn Ren, MD,FACP,FCCP  Electronically Signed    [facility]/MedQ  DD: 06/20/2006  DT: 06/20/2006  Job #: [id number]

## 2011-01-08 ENCOUNTER — Ambulatory Visit: Payer: 59 | Admitting: Cardiovascular Disease

## 2011-02-08 ENCOUNTER — Other Ambulatory Visit: Payer: Self-pay | Admitting: Internal Medicine

## 2011-02-08 MED ORDER — ALLOPURINOL 300 MG PO TABS: 300.0000 mg | ORAL_TABLET | Freq: Every day | ORAL | Status: DC

## 2011-02-08 MED ORDER — METOPROLOL SUCCINATE ER 100 MG PO TB24: 100.0000 mg | ORAL_TABLET | Freq: Every day | ORAL | Status: DC

## 2011-02-08 NOTE — Telephone Encounter (Signed)
RXs sent.

## 2011-02-08 NOTE — Telephone Encounter (Signed)
Patient has appt for CPX on 04/26/2011 at 9:30; he needs 90 day refills for Allopurinol and Metoprolol called into [facility] on [address] (stated that he wants local pharmacy this time)

## 2011-02-25 ENCOUNTER — Ambulatory Visit (INDEPENDENT_AMBULATORY_CARE_PROVIDER_SITE_OTHER): Payer: PRIVATE HEALTH INSURANCE | Admitting: *Deleted

## 2011-02-25 DIAGNOSIS — Z0000 Encounter for general adult medical examination without abnormal findings: Secondary | ICD-10-CM

## 2011-02-25 DIAGNOSIS — Z23 Encounter for immunization: Secondary | ICD-10-CM

## 2011-02-27 LAB — CBC
HCT: 46.9
Platelets: 342
WBC: 12.4 — ABNORMAL HIGH

## 2011-02-27 LAB — URINALYSIS, ROUTINE W REFLEX MICROSCOPIC
Glucose, UA: NEGATIVE
Specific Gravity, Urine: 1.024
pH: 6.5

## 2011-02-27 LAB — DIFFERENTIAL
Eosinophils Relative: 3
Lymphocytes Relative: 16
Lymphs Abs: 1.9
Neutrophils Relative %: 75

## 2011-02-27 LAB — BASIC METABOLIC PANEL
BUN: 12
GFR calc non Af Amer: >60
Potassium: 4.1
Sodium: 139

## 2011-02-27 LAB — GLUCOSE, CAPILLARY: Glucose-Capillary: 122 — ABNORMAL HIGH

## 2011-02-28 LAB — TB SKIN TEST
Induration: 0
TB Skin Test: NEGATIVE mm

## 2011-03-01 LAB — CBC
HCT: 42 % (ref 39.0–52.0)
MCV: 92.2 fL (ref 78.0–100.0)
RBC: 4.55 MIL/uL (ref 4.22–5.81)
WBC: 8.5 10*3/uL (ref 4.0–10.5)

## 2011-03-01 LAB — BASIC METABOLIC PANEL
Chloride: 104 mEq/L (ref 96–112)
GFR calc Af Amer: >60 mL/min (ref >60)
Potassium: 4 mEq/L (ref 3.5–5.1)
Sodium: 137 mEq/L (ref 135–145)

## 2011-04-26 ENCOUNTER — Ambulatory Visit (INDEPENDENT_AMBULATORY_CARE_PROVIDER_SITE_OTHER): Payer: PRIVATE HEALTH INSURANCE | Admitting: Internal Medicine

## 2011-04-26 ENCOUNTER — Encounter: Payer: Self-pay | Admitting: Internal Medicine

## 2011-04-26 DIAGNOSIS — Z0000 Encounter for general adult medical examination without abnormal findings: Secondary | ICD-10-CM

## 2011-04-26 DIAGNOSIS — E785 Hyperlipidemia, unspecified: Secondary | ICD-10-CM

## 2011-04-26 DIAGNOSIS — I2510 Atherosclerotic heart disease of native coronary artery without angina pectoris: Secondary | ICD-10-CM

## 2011-04-26 DIAGNOSIS — I10 Essential (primary) hypertension: Secondary | ICD-10-CM

## 2011-04-26 LAB — CBC WITH DIFFERENTIAL/PLATELET
Basophils Absolute: 0 10*3/uL (ref 0.0–0.1)
Eosinophils Absolute: 0.2 10*3/uL (ref 0.0–0.7)
Hemoglobin: 15.8 g/dL (ref 13.0–17.0)
Lymphocytes Relative: 28.1 % (ref 12.0–46.0)
MCHC: 34.2 g/dL (ref 30.0–36.0)
MCV: 92.4 fl (ref 78.0–100.0)
Monocytes Absolute: 0.6 10*3/uL (ref 0.1–1.0)
Neutro Abs: 4.2 10*3/uL (ref 1.4–7.7)
Neutrophils Relative %: 59.4 % (ref 43.0–77.0)
RDW: 13.2 % (ref 11.5–14.6)

## 2011-04-26 LAB — BASIC METABOLIC PANEL
CO2: 28 mEq/L (ref 19–32)
Calcium: 9.3 mg/dL (ref 8.4–10.5)
Chloride: 104 mEq/L (ref 96–112)
Creatinine, Ser: 0.9 mg/dL (ref 0.4–1.5)
Glucose, Bld: 96 mg/dL (ref 70–99)

## 2011-04-26 LAB — LIPID PANEL
HDL: 43.1 mg/dL (ref >39.00)
Total CHOL/HDL Ratio: 4

## 2011-04-26 LAB — TSH: TSH: 0.88 u[IU]/mL (ref 0.35–5.50)

## 2011-04-26 LAB — HEPATIC FUNCTION PANEL
AST: 29 U/L (ref 0–37)
Albumin: 4.5 g/dL (ref 3.5–5.2)
Alkaline Phosphatase: 58 U/L (ref 39–117)
Bilirubin, Direct: 0 mg/dL (ref 0.0–0.3)

## 2011-04-26 NOTE — Progress Notes (Signed)
Subjective:    Patient ID: Charles [name], male    DOB: 04/19/1961, 50 y.o.   MRN: [id number]  HPI  Charles [name]  is here for a physical; he denies acute issues except for enlarging lipoma L ventral wrist.      Review of Systems HYPERTENSION: Disease Monitoring: Blood pressure average- 135/85 with wrist cuff  Chest pain, palpitations- no       Dyspnea- no Medications: Compliance- yes  Lightheadedness,Syncope- some postural hypotension post prolonged sitting    Edema- no  HYPERLIPIDEMIA: Disease Monitoring: See symptoms for Hypertension Medications: Compliance- yes  Abd pain, bowel changes- no  (note: No colonoscopy to date. Standard of care reviewed)   Muscle aches- no  Polyuria/phagia/dipsia- no       Visual problems- no        Objective:   Physical Exam Gen.: Healthy and well-nourished in appearance. Alert, appropriate and cooperative throughout exam. Head: Normocephalic without obvious abnormalities;  no alopecia  Eyes: No corneal or conjunctival inflammation noted. Pupils equal round reactive to light and accommodation. Fundal exam is benign without hemorrhages, exudate, papilledema. Extraocular motion intact. Vision grossly normal. Ears: External  ear exam reveals no significant lesions or deformities. Canals clear .TMs normal. Hearing is grossly normal bilaterally. Nose: External nasal exam reveals no deformity or inflammation. Nasal mucosa are pink and moist. No lesions or exudates noted. Mouth: Oral mucosa and oropharynx reveal no lesions or exudates. Teeth in good repair. Neck: No deformities, masses, or tenderness noted. Range of motion &. Thyroid normal. Lungs: Normal respiratory effort; chest expands symmetrically. Lungs are clear to auscultation without rales, wheezes, or increased work of breathing. Heart: Slow rate and regular rhythm. Normal S1 and S2. No gallop, click, or rub. S 4 with slight slurring; no murmur. Abdomen: Bowel sounds normal; abdomen soft  and nontender. No masses, organomegaly or hernias noted. Genitalia/DRE: Genital exam is unremarkable. Prostate is normal without enlargement, nodularity, asymmetry, or induration.   .                                                                                   Musculoskeletal/extremities: No deformity or scoliosis noted of  the thoracic or lumbar spine. No clubbing, cyanosis, edema, or deformity noted. Range of motion  normal .Tone & strength  normal.Joints normal. Nail health  Good.Lipoma L ventral wrist Vascular: Carotid, radial artery, dorsalis pedis and  posterior tibial pulses are full and equal. No bruits present. Neurologic: Alert and oriented x3. Deep tendon reflexes symmetrical and normal.          Skin: Scattered Psoriatic lesions. Lymph: No cervical, axillary, or inguinal lymphadenopathy present. Psych: Mood and affect are normal. Normally interactive  Assessment & Plan:  #1 comprehensive physical exam; no acute findings #2 see Problem List with Assessments & Recommendations Plan: see Orders

## 2011-04-26 NOTE — Patient Instructions (Addendum)
Preventive Health Care: Exercise at least 30-45 minutes a day,  3-4 days a week.  Eat a low-fat diet with lots of fruits and vegetables, up to 7-9 servings per day. Consume less than 40 grams of sugar per day from foods & drinks with High Fructose Corn Sugar as #1 ,2,3 or # 4 on label. Health Care Power of Attorney & Living Will. Complete if not in place ; these place you in charge of your health care decisions. As per the Standard of Care , screening Colonoscopy recommended @ 50 & every 5-10 years thereafter . More frequent monitor would be dictated by family history or findings @ Colonoscopy .  If no change in medications is indicated based on labs ; Rxs will be sent to  [facility]

## 2011-05-01 ENCOUNTER — Encounter: Payer: Self-pay | Admitting: Internal Medicine

## 2011-05-01 ENCOUNTER — Ambulatory Visit (INDEPENDENT_AMBULATORY_CARE_PROVIDER_SITE_OTHER): Payer: PRIVATE HEALTH INSURANCE | Admitting: Internal Medicine

## 2011-05-01 DIAGNOSIS — J019 Acute sinusitis, unspecified: Secondary | ICD-10-CM

## 2011-05-01 DIAGNOSIS — M25819 Other specified joint disorders, unspecified shoulder: Secondary | ICD-10-CM

## 2011-05-01 DIAGNOSIS — F410 Panic disorder [episodic paroxysmal anxiety] without agoraphobia: Secondary | ICD-10-CM

## 2011-05-01 DIAGNOSIS — M7542 Impingement syndrome of left shoulder: Secondary | ICD-10-CM

## 2011-05-01 MED ORDER — AMOXICILLIN-POT CLAVULANATE 875-125 MG PO TABS: ORAL_TABLET | ORAL | Status: AC

## 2011-05-01 MED ORDER — FOLIC ACID 1 MG PO TABS: 1.0000 mg | ORAL_TABLET | Freq: Every day | ORAL | Status: DC

## 2011-05-01 MED ORDER — METOPROLOL SUCCINATE ER 100 MG PO TB24: 100.0000 mg | ORAL_TABLET | Freq: Every day | ORAL | Status: DC

## 2011-05-01 MED ORDER — ALLOPURINOL 300 MG PO TABS: 300.0000 mg | ORAL_TABLET | Freq: Every day | ORAL | Status: DC

## 2011-05-01 MED ORDER — AMOXICILLIN-POT CLAVULANATE 875-125 MG PO TABS: ORAL_TABLET | ORAL | Status: DC

## 2011-05-01 MED ORDER — FLUOXETINE HCL 20 MG PO CAPS: 20.0000 mg | ORAL_CAPSULE | Freq: Every day | ORAL | Status: DC

## 2011-05-01 MED ORDER — DICLOFENAC SODIUM 75 MG PO TBEC: 75.0000 mg | DELAYED_RELEASE_TABLET | Freq: Two times a day (BID) | ORAL | Status: AC

## 2011-05-01 MED ORDER — DICLOFENAC SODIUM 75 MG PO TBEC: 75.0000 mg | DELAYED_RELEASE_TABLET | Freq: Two times a day (BID) | ORAL | Status: DC

## 2011-05-01 MED ORDER — ROSUVASTATIN CALCIUM 10 MG PO TABS: 10.0000 mg | ORAL_TABLET | Freq: Every day | ORAL | Status: DC

## 2011-05-01 NOTE — Progress Notes (Signed)
Addended by: Beverely Low on: 05/01/2011 01:52 PM   Modules accepted: Orders

## 2011-05-01 NOTE — Progress Notes (Signed)
Addended byMarga Melnick F on: 05/01/2011 01:45 PM   Modules accepted: Orders

## 2011-05-01 NOTE — Patient Instructions (Signed)
Consider glucosamine sulfate 1500 mg daily for joint symptoms. Take this daily  for 4-6 weeks . This will rehydrate the cartilages. Use an anti-inflammatory cream such as Aspercreme or Zostrix cream twice a day to the left shoulder as needed. In lieu of this warm moist compresses or  hot water bottle can be used. Do not apply ice

## 2011-05-01 NOTE — Progress Notes (Signed)
  Subjective:    Patient ID: Charles [name], male    DOB: 09-02-1960, 50 y.o.   MRN: [id number]  HPI #1Shoulder pain Location: L Onset:11/29 upon awakening as soreness  Trigger/injury:? Sleeping in LL[facility] Pain quality:burning or as if "punched" Pain severity: up to 7 Duration:only while engaging in shoulder ROM activity Radiation:no Exacerbating factors:as noted Treatment/response:rest & stretching did not help; NSAIDS w/o help Review of systems: Constitutional: no fever, chills, sweats  Musculoskeletal:no  muscle cramps or pain; no  joint stiffness, redness, or swelling Skin:no rash, color change Neuro: no weakness; incontinence (stool/urine); numbness and tingling Heme:no lymphadenopathy; abnormal bruising or bleeding   #2 Respiratory tract infection Onset/symptoms:intermittent for 5 days as congestion Exposures (illness/environmental/extrinsic):no Progression of symptoms:to  bloody mucoid discharge Treatments/response:Neti pot, Flonase, Mucinex Present symptoms: Frontal headache:yes Facial pain:no Nasal purulence:scant green with blood Sore throat:no Dental pain:no Lymphadenopathy:no Wheezing/shortness of breath:no Cough/sputum/hemoptysis:no Associated extrinsic/allergic symptoms:itchy eyes/ sneezing:no Past medical history: Seasonal allergies: no , allergies are perennial/asthma:no Smoking history:quit 1999             Review of Systems     Objective:   Physical Exam Gen.: Healthy and well-nourished in appearance. Eyes: No corneal or conjunctival inflammation noted.  Ears: External  ear exam reveals no significant lesions or deformities. Canals clear .TMs normal. Hearing is grossly normal bilaterally. Nose: External nasal exam reveals no deformity or inflammation. Nasal mucosa are dry & erythematous, especially on R . No lesions or exudates noted. Septum  : slight deviation to R & slight dislocation  Mouth: Oral mucosa and oropharynx reveal no lesions or  exudates. Minimal posterior erythema.Teeth in good repair. Neck: No deformities, masses, or tenderness noted. Range of motion normal. Lungs: Normal respiratory effort; chest expands symmetrically. Lungs are clear to auscultation without rales, wheezes, or increased work of breathing.                                                                                  Musculoskeletal/extremities: No deformity or scoliosis noted of  the thoracic or lumbar spine. No clubbing, cyanosis, edema, or deformity noted. Range of motion decreased at the left shoulder. Mild crepitus is noted with range of motion. There is no pain to position of the left upper extremity .Tone & strength  normal.Joints normal. Nail health  good. Neurologic: Alert and oriented x3. Deep tendon reflexes symmetrical and normal.          Skin: Mild psoriatic rash diffusely of back Lymph: No cervical, axillaryl lymphadenopathy present. Psych: Mood and affect are normal. Normally interactive                                                                                        Assessment & Plan:  #1 left shoulder impingement syndrome  #2 rhinosinusitis  Plan: See orders and recommendations.

## 2011-05-06 ENCOUNTER — Ambulatory Visit: Payer: PRIVATE HEALTH INSURANCE | Admitting: Internal Medicine

## 2011-05-06 ENCOUNTER — Ambulatory Visit: Payer: PRIVATE HEALTH INSURANCE | Admitting: Physical Therapy

## 2011-05-23 ENCOUNTER — Telehealth: Payer: Self-pay | Admitting: Internal Medicine

## 2011-05-23 NOTE — Telephone Encounter (Signed)
[facility] - start date is [phone] -- patient wont be seen until [id number] - need start date changed

## 2011-05-23 NOTE — Progress Notes (Signed)
Addended by: Maurice Small on: 05/23/2011 05:07 PM   Modules accepted: Orders

## 2011-06-05 ENCOUNTER — Ambulatory Visit: Payer: 59 | Attending: Internal Medicine | Admitting: Physical Therapy

## 2011-06-05 DIAGNOSIS — M25519 Pain in unspecified shoulder: Secondary | ICD-10-CM | POA: Insufficient documentation

## 2011-06-05 DIAGNOSIS — IMO0001 Reserved for inherently not codable concepts without codable children: Secondary | ICD-10-CM | POA: Insufficient documentation

## 2011-06-10 ENCOUNTER — Ambulatory Visit: Payer: 59 | Admitting: Rehabilitation

## 2011-06-12 ENCOUNTER — Ambulatory Visit: Payer: 59 | Admitting: Physical Therapy

## 2011-06-18 ENCOUNTER — Ambulatory Visit: Payer: 59 | Admitting: Rehabilitation

## 2011-06-21 ENCOUNTER — Ambulatory Visit: Payer: 59 | Admitting: Rehabilitation

## 2011-06-25 ENCOUNTER — Ambulatory Visit: Payer: 59 | Admitting: Rehabilitation

## 2011-06-28 ENCOUNTER — Ambulatory Visit: Payer: PRIVATE HEALTH INSURANCE | Attending: Internal Medicine | Admitting: Rehabilitation

## 2011-06-28 DIAGNOSIS — M25519 Pain in unspecified shoulder: Secondary | ICD-10-CM | POA: Insufficient documentation

## 2011-06-28 DIAGNOSIS — IMO0001 Reserved for inherently not codable concepts without codable children: Secondary | ICD-10-CM | POA: Insufficient documentation

## 2011-07-02 ENCOUNTER — Ambulatory Visit: Payer: PRIVATE HEALTH INSURANCE | Admitting: Rehabilitation

## 2011-07-04 ENCOUNTER — Ambulatory Visit: Payer: PRIVATE HEALTH INSURANCE | Admitting: Physical Therapy

## 2011-07-09 ENCOUNTER — Ambulatory Visit: Payer: PRIVATE HEALTH INSURANCE | Admitting: Physical Therapy

## 2011-07-10 ENCOUNTER — Ambulatory Visit: Payer: PRIVATE HEALTH INSURANCE | Admitting: Physical Therapy

## 2011-07-16 ENCOUNTER — Ambulatory Visit: Payer: PRIVATE HEALTH INSURANCE | Admitting: Rehabilitation

## 2011-07-18 ENCOUNTER — Ambulatory Visit: Payer: PRIVATE HEALTH INSURANCE | Admitting: Physical Therapy

## 2011-07-23 ENCOUNTER — Encounter: Payer: PRIVATE HEALTH INSURANCE | Admitting: Physical Therapy

## 2011-07-25 ENCOUNTER — Ambulatory Visit: Payer: PRIVATE HEALTH INSURANCE | Admitting: Rehabilitation

## 2011-07-30 ENCOUNTER — Encounter: Payer: PRIVATE HEALTH INSURANCE | Admitting: Rehabilitation

## 2011-08-01 ENCOUNTER — Ambulatory Visit: Payer: 59 | Attending: Internal Medicine | Admitting: Rehabilitation

## 2011-08-01 DIAGNOSIS — M25519 Pain in unspecified shoulder: Secondary | ICD-10-CM | POA: Insufficient documentation

## 2011-08-01 DIAGNOSIS — IMO0001 Reserved for inherently not codable concepts without codable children: Secondary | ICD-10-CM | POA: Insufficient documentation

## 2011-08-06 ENCOUNTER — Ambulatory Visit: Payer: 59 | Admitting: Rehabilitation

## 2011-08-08 ENCOUNTER — Ambulatory Visit: Payer: 59 | Admitting: Physical Therapy

## 2011-08-13 ENCOUNTER — Ambulatory Visit: Payer: 59 | Admitting: Rehabilitation

## 2011-08-15 ENCOUNTER — Encounter: Payer: PRIVATE HEALTH INSURANCE | Admitting: Rehabilitation

## 2011-08-16 ENCOUNTER — Encounter: Payer: PRIVATE HEALTH INSURANCE | Admitting: Rehabilitation

## 2011-08-20 ENCOUNTER — Encounter: Payer: PRIVATE HEALTH INSURANCE | Admitting: Rehabilitation

## 2011-08-22 ENCOUNTER — Encounter: Payer: PRIVATE HEALTH INSURANCE | Admitting: Physical Therapy

## 2012-02-12 ENCOUNTER — Telehealth: Payer: Self-pay | Admitting: Internal Medicine

## 2012-02-12 NOTE — Telephone Encounter (Signed)
Patient called stating he needs to have tb skin test and a flu shot for his job. I advised him that he would need 2 separate visits and pt stated he had both done last year at the same time. Please advise.

## 2012-02-12 NOTE — Telephone Encounter (Signed)
Patient can have the both of these at the same time. The skin test can be done any day except Thursday

## 2012-02-12 NOTE — Telephone Encounter (Signed)
Done. Thank you.

## 2012-02-18 ENCOUNTER — Ambulatory Visit (INDEPENDENT_AMBULATORY_CARE_PROVIDER_SITE_OTHER): Payer: PRIVATE HEALTH INSURANCE | Admitting: *Deleted

## 2012-02-18 ENCOUNTER — Encounter: Payer: Self-pay | Admitting: *Deleted

## 2012-02-18 VITALS — Temp 98.1°F

## 2012-02-18 DIAGNOSIS — Z111 Encounter for screening for respiratory tuberculosis: Secondary | ICD-10-CM

## 2012-02-18 DIAGNOSIS — Z23 Encounter for immunization: Secondary | ICD-10-CM

## 2012-02-18 NOTE — Patient Instructions (Signed)
pt was informed of allergy to Neomycin Sulfate as anaphylactic shock, pt stated " I have had the vaccine for the past three years with no issues" MD Alwyn Ren advised to inform pt of risk of allergy to flu vaccine and monitor pt for for any allergic reaction that may occur, no reaction noted, advised pt if he notes any swelling of tongue or sxs that indicate a allergic reaction to please go to the ER and if he can not get there call 911, pt understood and will continue to monitor for any sxs, pt noted that he understands and will implement plan of action for ER/911 if any sxs noted, Manager and MD Alwyn Ren advised verbally pt agreement to have vaccine administered and accepted instructions.pt will come back to have Tb skin test read in 2-3 days, pt given paperwork for flu vaccine per work requirement.

## 2012-02-20 LAB — TB SKIN TEST: Induration: 0 mm

## 2012-05-02 ENCOUNTER — Other Ambulatory Visit: Payer: Self-pay | Admitting: Internal Medicine

## 2012-05-04 ENCOUNTER — Telehealth: Payer: Self-pay | Admitting: Internal Medicine

## 2012-05-04 MED ORDER — METOPROLOL SUCCINATE ER 100 MG PO TB24: 100.0000 mg | ORAL_TABLET | Freq: Every day | ORAL | Status: DC

## 2012-05-04 NOTE — Telephone Encounter (Signed)
REFILLS X 2 --last ov --- 12.5.2 acute last labs 11.30.12--next cpe 12.13.13   1-Refill Diclofenac Sodium 75MG  DR tablets Take one tablet by mouth twice daily with a meal #30 last fill 12.7.13   2-Metoprolol ER Succinate 100MG  TAB Take one tablet by mouth every day #90--last fill 12.7.13

## 2012-05-04 NOTE — Telephone Encounter (Signed)
Diclofenac OK

## 2012-05-04 NOTE — Telephone Encounter (Signed)
Patient with pending appointment 05/08/2012, rx for metoprolol sent, Hopp please advise on request for Diclofenac for it is NOT on medication list

## 2012-05-05 ENCOUNTER — Other Ambulatory Visit: Payer: Self-pay | Admitting: *Deleted

## 2012-05-05 DIAGNOSIS — R52 Pain, unspecified: Secondary | ICD-10-CM

## 2012-05-05 MED ORDER — DICLOFENAC SODIUM 75 MG PO TBEC: 75.0000 mg | DELAYED_RELEASE_TABLET | Freq: Two times a day (BID) | ORAL | Status: DC

## 2012-05-05 NOTE — Telephone Encounter (Signed)
Rx for Diclofenac sent to [facility] in [place], pt notified.

## 2012-05-08 ENCOUNTER — Ambulatory Visit (INDEPENDENT_AMBULATORY_CARE_PROVIDER_SITE_OTHER): Payer: PRIVATE HEALTH INSURANCE | Admitting: Internal Medicine

## 2012-05-08 ENCOUNTER — Encounter: Payer: Self-pay | Admitting: Internal Medicine

## 2012-05-08 VITALS — BP 130/88 | HR 65 | Temp 98.1°F | Resp 12 | Ht 68.08 in | Wt 229.0 lb

## 2012-05-08 DIAGNOSIS — L409 Psoriasis, unspecified: Secondary | ICD-10-CM | POA: Insufficient documentation

## 2012-05-08 DIAGNOSIS — L408 Other psoriasis: Secondary | ICD-10-CM

## 2012-05-08 DIAGNOSIS — Z0000 Encounter for general adult medical examination without abnormal findings: Secondary | ICD-10-CM

## 2012-05-08 DIAGNOSIS — F410 Panic disorder [episodic paroxysmal anxiety] without agoraphobia: Secondary | ICD-10-CM

## 2012-05-08 DIAGNOSIS — E785 Hyperlipidemia, unspecified: Secondary | ICD-10-CM

## 2012-05-08 LAB — BASIC METABOLIC PANEL
CO2: 29 mEq/L (ref 19–32)
Chloride: 102 mEq/L (ref 96–112)
Glucose, Bld: 105 mg/dL — ABNORMAL HIGH (ref 70–99)
Potassium: 4.5 mEq/L (ref 3.5–5.1)
Sodium: 138 mEq/L (ref 135–145)

## 2012-05-08 LAB — CBC WITH DIFFERENTIAL/PLATELET
Basophils Absolute: 0 10*3/uL (ref 0.0–0.1)
Basophils Relative: 0.5 % (ref 0.0–3.0)
Eosinophils Absolute: 0.3 10*3/uL (ref 0.0–0.7)
HCT: 44.5 % (ref 39.0–52.0)
Hemoglobin: 15.1 g/dL (ref 13.0–17.0)
Lymphocytes Relative: 27.6 % (ref 12.0–46.0)
Lymphs Abs: 1.7 10*3/uL (ref 0.7–4.0)
MCHC: 34 g/dL (ref 30.0–36.0)
MCV: 90.8 fl (ref 78.0–100.0)
Neutro Abs: 3.7 10*3/uL (ref 1.4–7.7)
RBC: 4.9 Mil/uL (ref 4.22–5.81)
RDW: 13.1 % (ref 11.5–14.6)

## 2012-05-08 LAB — LIPID PANEL
Cholesterol: 145 mg/dL (ref 0–200)
HDL: 38.6 mg/dL — ABNORMAL LOW (ref >39.00)
VLDL: 38.4 mg/dL (ref 0.0–40.0)

## 2012-05-08 LAB — HEPATIC FUNCTION PANEL
Albumin: 4.3 g/dL (ref 3.5–5.2)
Alkaline Phosphatase: 54 U/L (ref 39–117)
Total Protein: 7.2 g/dL (ref 6.0–8.3)

## 2012-05-08 LAB — TSH: TSH: 0.86 u[IU]/mL (ref 0.35–5.50)

## 2012-05-08 LAB — PSA: PSA: 2.29 ng/mL (ref 0.10–4.00)

## 2012-05-08 MED ORDER — CLOBETASOL PROPIONATE 0.05 % EX CREA: 1.0000 "application " | TOPICAL_CREAM | Freq: Two times a day (BID) | CUTANEOUS | Status: DC

## 2012-05-08 MED ORDER — FLUOCINONIDE 0.05 % EX SOLN: 1.0000 "application " | Freq: Two times a day (BID) | CUTANEOUS | Status: DC

## 2012-05-08 MED ORDER — ALLOPURINOL 300 MG PO TABS: ORAL_TABLET | ORAL | Status: DC

## 2012-05-08 MED ORDER — ROSUVASTATIN CALCIUM 10 MG PO TABS: 10.0000 mg | ORAL_TABLET | Freq: Every day | ORAL | Status: DC

## 2012-05-08 MED ORDER — FOLIC ACID 1 MG PO TABS: 1.0000 mg | ORAL_TABLET | Freq: Every day | ORAL | Status: DC

## 2012-05-08 MED ORDER — METOPROLOL SUCCINATE ER 100 MG PO TB24: 100.0000 mg | ORAL_TABLET | Freq: Every day | ORAL | Status: DC

## 2012-05-08 MED ORDER — FLUOXETINE HCL 20 MG PO CAPS: 20.0000 mg | ORAL_CAPSULE | Freq: Every day | ORAL | Status: DC

## 2012-05-08 MED ORDER — FLUOCINONIDE 0.05 % EX CREA: 1.0000 "application " | TOPICAL_CREAM | Freq: Two times a day (BID) | CUTANEOUS | Status: DC

## 2012-05-08 NOTE — Patient Instructions (Addendum)
Preventive Health Care: Exercise at least 30-45 minutes a day,  3-4 days a week.  Eat a low-fat diet with lots of fruits and vegetables, up to 7-9 servings per day.  Consume less than 40 grams of sugar per day from foods & drinks with High Fructose Corn Sugar as #1,2,3 or # 4 on label. Health Care Power of Attorney & Living Will. Complete if not in place ; these place you in charge of your health care decisions. Blood Pressure Goal  Ideally is an AVERAGE < 135/85. This AVERAGE should be calculated from @ least 5-7 BP readings taken @ different times of day on different days of week. You should not respond to isolated BP readings , but rather the AVERAGE for that week . Because of a history of documented adverse serious drug reaction;Medi Alert bracelet  is recommended. As per the Standard of Care , screening Colonoscopy recommended @ 50 & every 5-10 years thereafter . More frequent monitor would be dictated by family history or findings @ Colonoscopy   If you activate My Chart; the results can be released to you as soon as they populate from the lab. If you choose not to use this program; the labs have to be reviewed, copied & mailed   causing a delay in getting the results to you.

## 2012-05-08 NOTE — Progress Notes (Signed)
  Subjective:    Patient ID: Charles [name], male    DOB: 10-03-1960, 51 y.o.   MRN: [id number]  HPI  Mr Lowella Petties is here for a physical;acute issues include Psoriatic flares      Review of Systems  He is on no specific diet, but he does try to avoid fried foods and fatty foods. His exercise program has diminished significantly because of work demands.  He denies chest pain, palpitations, dyspnea, edema, or claudication. He is compliant with his statin; he denies abdominal pain or myalgias. He does have intermittent constipation.  No colonoscopy to date; standard of care reviewed and colonoscopy recommended     Objective:   Physical Exam Gen.: well-nourished in appearance. Alert, appropriate and cooperative throughout exam. Head: Normocephalic without obvious abnormalities;  no alopecia  Eyes: No corneal or conjunctival inflammation noted. Pupils equal round reactive to light and accommodation. Fundal exam is benign without hemorrhages, exudate, papilledema. Extraocular motion intact. Vision grossly normal. Ears: External  ear exam reveals no significant lesions or deformities. Canals clear .TMs normal. Hearing is grossly normal bilaterally. Nose: External nasal exam reveals no deformity or inflammation. Nasal mucosa are pink and moist. No lesions or exudates noted.   Mouth: Oral mucosa and oropharynx reveal no lesions or exudates. Teeth in good repair. Neck: No deformities, masses, or tenderness noted. Range of motion & Thyroid normal. Lungs: Normal respiratory effort; chest expands symmetrically. Lungs are clear to auscultation without rales, wheezes, or increased work of breathing. Heart: Normal rate and rhythm. Normal S1 and S2. No gallop, click, or rub. S4 w/o murmur. Abdomen: Bowel sounds normal; abdomen soft and nontender. No masses, organomegaly or hernias noted. Genitalia/DRE: Genitalia normal except for left varices. Prostate is normal without enlargement, asymmetry, nodularity,  or induration. (SOC reviewed ; PSA requested) Musculoskeletal/extremities: No deformity or scoliosis noted of  the thoracic or lumbar spine. No clubbing, cyanosis, edema, or deformity noted. Range of motion  normal .Tone & strength  normal.Joints normal. Nail health  good. Vascular: Carotid, radial artery, dorsalis pedis and  posterior tibial pulses are full and equal. No bruits present. Neurologic: Alert and oriented x3. Deep tendon reflexes symmetrical and normal.          Skin:Scattered psoriatic lesions especially knees & elbows Lymph: No cervical, axillary, or inguinal lymphadenopathy present. Psych: Mood and affect are normal. Normally interactive                                                                                         Assessment & Plan:  #1 comprehensive physical exam; no acute findings #2 active Psoriasis Plan: see Orders

## 2012-05-11 ENCOUNTER — Ambulatory Visit: Payer: PRIVATE HEALTH INSURANCE

## 2012-05-11 DIAGNOSIS — R7309 Other abnormal glucose: Secondary | ICD-10-CM

## 2012-05-12 LAB — HEMOGLOBIN A1C: Hgb A1c MFr Bld: 6 % (ref 4.6–6.5)

## 2012-05-18 ENCOUNTER — Telehealth: Payer: Self-pay | Admitting: Internal Medicine

## 2012-05-18 NOTE — Telephone Encounter (Signed)
needs mychart codes please mail

## 2012-05-21 ENCOUNTER — Encounter: Payer: Self-pay | Admitting: Internal Medicine

## 2012-05-21 NOTE — Telephone Encounter (Signed)
Letter mailed

## 2012-10-05 ENCOUNTER — Other Ambulatory Visit: Payer: Self-pay | Admitting: General Practice

## 2012-10-05 MED ORDER — FLUOCINONIDE 0.05 % EX SOLN: 1.0000 "application " | Freq: Two times a day (BID) | CUTANEOUS | Status: DC

## 2012-10-05 MED ORDER — CLOBETASOL PROPIONATE 0.05 % EX CREA: 1.0000 "application " | TOPICAL_CREAM | Freq: Two times a day (BID) | CUTANEOUS | Status: DC

## 2013-02-16 ENCOUNTER — Ambulatory Visit (INDEPENDENT_AMBULATORY_CARE_PROVIDER_SITE_OTHER): Payer: PRIVATE HEALTH INSURANCE | Admitting: *Deleted

## 2013-02-16 DIAGNOSIS — Z111 Encounter for screening for respiratory tuberculosis: Secondary | ICD-10-CM

## 2013-02-16 DIAGNOSIS — Z23 Encounter for immunization: Secondary | ICD-10-CM

## 2013-02-18 ENCOUNTER — Encounter: Payer: Self-pay | Admitting: *Deleted

## 2013-02-18 LAB — TB SKIN TEST

## 2013-04-26 ENCOUNTER — Encounter: Payer: Self-pay | Admitting: Internal Medicine

## 2013-04-26 ENCOUNTER — Ambulatory Visit (INDEPENDENT_AMBULATORY_CARE_PROVIDER_SITE_OTHER): Payer: PRIVATE HEALTH INSURANCE | Admitting: Internal Medicine

## 2013-04-26 VITALS — BP 135/92 | HR 75 | Temp 98.8°F | Resp 12 | Ht 68.5 in | Wt 236.8 lb

## 2013-04-26 DIAGNOSIS — F410 Panic disorder [episodic paroxysmal anxiety] without agoraphobia: Secondary | ICD-10-CM

## 2013-04-26 DIAGNOSIS — M5412 Radiculopathy, cervical region: Secondary | ICD-10-CM

## 2013-04-26 MED ORDER — FLUOXETINE HCL 20 MG PO CAPS: 20.0000 mg | ORAL_CAPSULE | Freq: Every day | ORAL | Status: DC

## 2013-04-26 MED ORDER — PREDNISONE 20 MG PO TABS: 20.0000 mg | ORAL_TABLET | Freq: Two times a day (BID) | ORAL | Status: DC

## 2013-04-26 MED ORDER — TRAMADOL HCL 50 MG PO TABS: 50.0000 mg | ORAL_TABLET | Freq: Four times a day (QID) | ORAL | Status: DC | PRN

## 2013-04-26 MED ORDER — CYCLOBENZAPRINE HCL 5 MG PO TABS: ORAL_TABLET | ORAL | Status: DC

## 2013-04-26 MED ORDER — METOPROLOL SUCCINATE ER 100 MG PO TB24: 100.0000 mg | ORAL_TABLET | Freq: Every day | ORAL | Status: DC

## 2013-04-26 MED ORDER — ALLOPURINOL 300 MG PO TABS: ORAL_TABLET | ORAL | Status: DC

## 2013-04-26 NOTE — Progress Notes (Signed)
Pre visit review using our clinic review tool, if applicable. No additional management support is needed unless otherwise documented below in the visit note. 

## 2013-04-26 NOTE — Progress Notes (Signed)
   Subjective:    Patient ID: Charles [name], male    DOB: 1961-05-02, 52 y.o.   MRN: [id number]  HPI    Symptoms began 04/21/13 as dull constant pain in the right upper back to the right of the mid spine and in the shoulder blade area. He was also some involvement in the lower neck. He has noted some radiation of pain into the right upper extremity with associated tingling.  The only  trigger was moving a cart full of parts & tools 11/24. This involved 70-80 pounds being moved 500 - 600 feet  His pain is now worse if he lies on his back or his right shoulder. 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  He's had some sweats and some fatigue.  He has a physical scheduled for next month & will need medication refills prior to that time.    Review of Systems   He does not have fever, chills, unexplained weight loss, visual changes, syncope, chest pain, palpitations, or dyspnea  There is no weakness in his extremities or imbalance. He has no bladder or bowel incontinence.     Objective:   Physical Exam Gen.: Adequately nourished in appearance. Alert, appropriate and cooperative throughout exam. Head: Normocephalic without obvious abnormalities  Eyes: No corneal or conjunctival inflammation noted.   Neck: No deformities, masses, or tenderness noted. Range of motion good. Lungs: Normal respiratory effort; chest expands symmetrically. Lungs are clear to auscultation without rales, wheezes, or increased work of breathing. Heart: Normal rate and rhythm. Normal S1 and S2. No gallop, click, or rub. No murmur.                                   Musculoskeletal/extremities: No deformity or scoliosis noted of  the thoracic or lumbar spine.   No clubbing, cyanosis, edema, or significant extremity  deformity noted. Range of motion normal .Tone & strength normal. Hand joints normal. Fingernail  health  good. Vascular: Carotid, radial artery pulses are full and equal. No bruits present. Neurologic: Alert and oriented x3. Deep tendon reflexes symmetrical and normal.  Gait normal  including heel & toe walking . No neuromuscular deficit in the upper extremities present Skin: Scattered psoriatic lesions. Skin damp to palpation. Lymph: No cervical, axillary lymphadenopathy present. Psych: Mood and affect are normal. Normally interactive                                                                                        Assessment & Plan:  #1 cervical radicular pain  #2 HTN in context of pain #3 med refill ; orders for labs entered See orders

## 2013-04-26 NOTE — Patient Instructions (Signed)

## 2013-04-27 ENCOUNTER — Other Ambulatory Visit: Payer: Self-pay | Admitting: Internal Medicine

## 2013-04-27 DIAGNOSIS — Z0000 Encounter for general adult medical examination without abnormal findings: Secondary | ICD-10-CM

## 2013-04-28 ENCOUNTER — Encounter: Payer: Self-pay | Admitting: Internal Medicine

## 2013-04-28 ENCOUNTER — Telehealth: Payer: Self-pay | Admitting: Internal Medicine

## 2013-04-28 NOTE — Telephone Encounter (Signed)
Patient job states that he needs a letter stating his condition, course of the condition and his restrictions for being out of work for 3 days.  Patient was seen on Monday.

## 2013-04-28 NOTE — Telephone Encounter (Signed)
See letter.

## 2013-04-28 NOTE — Telephone Encounter (Signed)
Called and left message for patient informing him that letter is ready for pickup at our front desk. JG//CMA

## 2013-04-28 NOTE — Telephone Encounter (Signed)
Please advise to what needs to be in letter.

## 2013-05-07 ENCOUNTER — Telehealth: Payer: Self-pay | Admitting: Internal Medicine

## 2013-05-07 NOTE — Telephone Encounter (Signed)
Patient called and stated that dr hopper prescribe 3 medications (tremadol, prednisone and zyloprim)  last week for pain. Patient thinks he might be having side affects because her is experiencing urinary retention. Patient would like dr hopper advise on this should he stop taking his medication or keep taking it.

## 2013-05-07 NOTE — Telephone Encounter (Signed)
Patient is calling back about his medication side effect. Please advise.

## 2013-05-07 NOTE — Telephone Encounter (Signed)
Please advise.//AB/CMA 

## 2013-05-07 NOTE — Telephone Encounter (Signed)
Stop Tramadol. If the symptoms persist after this is stopped, stop the Zyloprim.

## 2013-05-07 NOTE — Telephone Encounter (Addendum)
Spoke with the pt and informed him of Dr. Frederik Pear recommendation.  Pt stated that he was taking Prednisone and Flexeril which he has been off since Wed, and he has not taking the Tramadol since yest. Pt stated that he is slowly getting better.  Informed the pt that Dr. Alwyn Ren stated that the sxs should not be coming from the meds because the meds should be out of his system.  Also informed him that he should not take decongest, and if sxs persist he may need a prostate check.  Pt agreed.//AB/CMA

## 2013-05-08 ENCOUNTER — Encounter (HOSPITAL_BASED_OUTPATIENT_CLINIC_OR_DEPARTMENT_OTHER): Payer: Self-pay | Admitting: Emergency Medicine

## 2013-05-08 ENCOUNTER — Emergency Department (HOSPITAL_BASED_OUTPATIENT_CLINIC_OR_DEPARTMENT_OTHER): Payer: 59

## 2013-05-08 ENCOUNTER — Emergency Department (HOSPITAL_BASED_OUTPATIENT_CLINIC_OR_DEPARTMENT_OTHER)
Admission: EM | Admit: 2013-05-08 | Discharge: 2013-05-08 | Disposition: A | Discharge status: Home / Self Care | Payer: 59 | Attending: Emergency Medicine | Admitting: Emergency Medicine

## 2013-05-08 DIAGNOSIS — R9389 Abnormal findings on diagnostic imaging of other specified body structures: Secondary | ICD-10-CM | POA: Insufficient documentation

## 2013-05-08 DIAGNOSIS — E785 Hyperlipidemia, unspecified: Secondary | ICD-10-CM | POA: Insufficient documentation

## 2013-05-08 DIAGNOSIS — R937 Abnormal findings on diagnostic imaging of other parts of musculoskeletal system: Secondary | ICD-10-CM

## 2013-05-08 DIAGNOSIS — I10 Essential (primary) hypertension: Secondary | ICD-10-CM | POA: Insufficient documentation

## 2013-05-08 DIAGNOSIS — I2510 Atherosclerotic heart disease of native coronary artery without angina pectoris: Secondary | ICD-10-CM | POA: Insufficient documentation

## 2013-05-08 DIAGNOSIS — Z872 Personal history of diseases of the skin and subcutaneous tissue: Secondary | ICD-10-CM | POA: Insufficient documentation

## 2013-05-08 DIAGNOSIS — IMO0002 Reserved for concepts with insufficient information to code with codable children: Secondary | ICD-10-CM | POA: Insufficient documentation

## 2013-05-08 DIAGNOSIS — Z79899 Other long term (current) drug therapy: Secondary | ICD-10-CM | POA: Insufficient documentation

## 2013-05-08 DIAGNOSIS — Z9861 Coronary angioplasty status: Secondary | ICD-10-CM | POA: Insufficient documentation

## 2013-05-08 DIAGNOSIS — R339 Retention of urine, unspecified: Secondary | ICD-10-CM

## 2013-05-08 DIAGNOSIS — M109 Gout, unspecified: Secondary | ICD-10-CM | POA: Insufficient documentation

## 2013-05-08 DIAGNOSIS — Z87891 Personal history of nicotine dependence: Secondary | ICD-10-CM | POA: Insufficient documentation

## 2013-05-08 LAB — CBC WITH DIFFERENTIAL/PLATELET
Basophils Absolute: 0 10*3/uL (ref 0.0–0.1)
Basophils Relative: 0 % (ref 0–1)
HCT: 47.5 % (ref 39.0–52.0)
Hemoglobin: 15.9 g/dL (ref 13.0–17.0)
Lymphocytes Relative: 24 % (ref 12–46)
MCH: 30.6 pg (ref 26.0–34.0)
MCHC: 33.5 g/dL (ref 30.0–36.0)
MCV: 91.3 fL (ref 78.0–100.0)
Monocytes Relative: 5 % (ref 3–12)
Neutro Abs: 5.1 10*3/uL (ref 1.7–7.7)
Neutrophils Relative %: 67 % (ref 43–77)
RBC: 5.2 MIL/uL (ref 4.22–5.81)
RDW: 13 % (ref 11.5–15.5)
WBC: 7.5 10*3/uL (ref 4.0–10.5)

## 2013-05-08 LAB — BASIC METABOLIC PANEL
BUN: 17 mg/dL (ref 6–23)
CO2: 28 mEq/L (ref 19–32)
Chloride: 100 mEq/L (ref 96–112)
GFR calc Af Amer: >90 mL/min (ref >90)
Potassium: 4.3 mEq/L (ref 3.5–5.1)

## 2013-05-08 LAB — URINALYSIS, ROUTINE W REFLEX MICROSCOPIC
Bilirubin Urine: NEGATIVE
Glucose, UA: NEGATIVE mg/dL
Ketones, ur: NEGATIVE mg/dL
Leukocytes, UA: NEGATIVE
Nitrite: NEGATIVE
Specific Gravity, Urine: 1.021 (ref 1.005–1.030)
pH: 7 (ref 5.0–8.0)

## 2013-05-08 NOTE — ED Provider Notes (Signed)
CSN: [id number]     Arrival date & time 05/08/13  1029 History   First MD Initiated Contact with Patient 05/08/13 1035     Chief Complaint  Patient presents with  . Urinary Retention   (Consider location/radiation/quality/duration/timing/severity/associated sxs/prior Treatment) HPI This 52 year old male has a history of some mild chronic intermittent neck and back pain who hurt himself at work to couple weeks ago resulting in some increased neck pain with right shoulder pain worse with movement which is improving but not quite back to baseline yet, he is no radiation of pain down his arm to his hand he is no weakness or numbness to his right arm he is no bilateral symptoms at all with no left arm pain no pain to his legs no weakness or numbness to his arms or legs however he does have a change in bladder function the last few days with urinary retention with incomplete emptying with no dysuria no fever no vomiting no abdominal pain no chest pain no shortness of breath no cough no difficulty with walking, he last urinated within a half an hour prior to arrival very small amount according to the patient, he has had normal oral intake, he stops taking tramadol and Flexeril a few days ago when he developed his incomplete emptying with partial urinary retention, his symptoms have worsened despite stopping those medicines a few days ago, he has recently been on prednisone for his symptoms as well. Past Medical History  Diagnosis Date  . Anaphylaxis     Neomycin sulfate  . Gout   . Hyperlipidemia LDL goal <100   . Hypertension   . Psoriasis   . CAD (coronary artery disease) 04/2008    successful PCI of the lesion in the proximal LAD using xience drug-eluting stent with improvement in central narrowing from 95% to 0%.  Dr. Juanda Chance   Past Surgical History  Procedure Laterality Date  . Bunionectomy    . Ganglion cyst excision    . Ptca  04/2008    Dr  Charlies Constable  . Lipoma resection  2001    L  wrist  . Coronary angioplasty with stent placement     Family History  Problem Relation Age of Onset  . Hypertension Father   . Stroke Father   . Lung cancer Mother      smoker  . Diabetes Sister   . Heart disease Brother      stent LAD 2012   History  Substance Use Topics  . Smoking status: Former Smoker    Quit date: 05/27/1997  . Smokeless tobacco: Not on file  . Alcohol Use: Yes     Comment: Infrequently    Review of Systems 10 Systems reviewed and are negative for acute change except as noted in the HPI. Allergies  Lidocaine; Neomycin sulfate; Sulfa drugs cross reactors; and Hctz  Home Medications   Current Outpatient Rx  Name  Route  Sig  Dispense  Refill  . allopurinol (ZYLOPRIM) 300 MG tablet      TAKE 1 TABLET BY MOUTH EVERY DAY   90 tablet   0   . clobetasol cream (TEMOVATE) 0.05 %   Topical   Apply 1 application topically 2 (two) times daily. Apply 1 application topically 2 (two) times daily as needed   180 g   3   . cyclobenzaprine (FLEXERIL) 5 MG tablet      1-2 qhs prn   14 tablet   0   . fluocinonide (LIDEX)  0.05 % external solution   Topical   Apply 1 application topically 2 (two) times daily. As needed   360 mL   3   . fluocinonide cream (LIDEX) 0.05 %   Topical   Apply 1 application topically 2 (two) times daily. Apply 1 application topically 2 (two) times daily as needed   60 g   3   . FLUoxetine (PROZAC) 20 MG capsule   Oral   Take 1 capsule (20 mg total) by mouth daily.   90 capsule   0   . folic acid (FOLVITE) 1 MG tablet   Oral   Take 1 tablet (1 mg total) by mouth daily.   90 tablet   3   . metoprolol succinate (TOPROL-XL) 100 MG 24 hr tablet   Oral   Take 1 tablet (100 mg total) by mouth daily.   90 tablet   0   . Multiple Vitamin (MULTIVITAMIN) capsule   Oral   Take 1 capsule by mouth daily.           . nitroGLYCERIN (NITROSTAT) 0.3 MG SL tablet   Sublingual   Place 0.3 mg under the tongue every 5 (five)  minutes as needed.           . Omega-3 Fatty Acids (FISH OIL) 1000 MG CAPS   Oral   Take 1,000 mg by mouth 2 (two) times daily.           . predniSONE (DELTASONE) 20 MG tablet   Oral   Take 1 tablet (20 mg total) by mouth 2 (two) times daily.   14 tablet   0   . rosuvastatin (CRESTOR) 10 MG tablet   Oral   Take 1 tablet (10 mg total) by mouth daily.   90 tablet   3   . traMADol (ULTRAM) 50 MG tablet   Oral   Take 1 tablet (50 mg total) by mouth every 6 (six) hours as needed.   30 tablet   0    BP 135/83  Pulse 68  Temp(Src) 98.5 F (36.9 C)  Resp 17  SpO2 100% Physical Exam  Nursing note and vitals reviewed. Constitutional:  Awake, alert, nontoxic appearance with baseline speech.  HENT:  Head: Atraumatic.  Eyes: Pupils are equal, round, and reactive to light. Right eye exhibits no discharge. Left eye exhibits no discharge.  Neck: Neck supple.  Cervical spine nontender  Cardiovascular: Normal rate and regular rhythm.   No murmur heard. Pulmonary/Chest: Effort normal and breath sounds normal. No respiratory distress. He has no wheezes. He has no rales. He exhibits no tenderness.  Abdominal: Soft. Bowel sounds are normal. He exhibits no mass. There is no tenderness. There is no rebound.  Musculoskeletal:       Thoracic back: He exhibits no tenderness.       Lumbar back: He exhibits no tenderness.  Bilateral lower extremities non tender without new rashes or color change, baseline ROM with intact DP pulses, CR<2 secs all digits bilaterally, sensation baseline light touch bilaterally for pt, DTR's symmetric and intact bilaterally KJ / AJ, motor symmetric bilateral 5 / 5 hip flexion, quadriceps, hamstrings, EHL, foot dorsiflexion, foot plantarflexion, gait normal without ataxia. Bilateral upper extremities normal light touch was intact strength 5 out of 5 in the distributions of the median radial ulnar nerve function. Reflexes symmetric without clonus or hyperreflexia in  all 4 extremities.  Neurological:  Mental status baseline for patient.  Upper extremity motor strength and sensation intact  and symmetric bilaterally.  Skin: No rash noted.  Psychiatric: He has a normal mood and affect.    ED Course  Procedures (including critical care time) Limited bedside emergency department ultrasound bladder reveals ~44ml PVR. Due to patient's history of intermittent chronic neck and back pain with recent exacerbation of some neck pain now with partial urinary retention which could be medication related however feel it is prudent to rule out anatomic lesion of spine with MRI. Patient / Family / Caregiver understand and agree with initial ED impression and plan with expectations set for ED visit. Foley placed in ED. D/w NSurg Elsner who reviewed MR doubts cord impingement or cauda equina syndrome as cause of retention.Patient / Family / Caregiver informed of clinical course, understand medical decision-making process, and agree with plan. Labs Review Labs Reviewed  BASIC METABOLIC PANEL - Abnormal; Notable for the following:    Glucose, Bld 148 (*)    All other components within normal limits  URINALYSIS, ROUTINE W REFLEX MICROSCOPIC  CBC WITH DIFFERENTIAL   Imaging Review Mr Cervical Spine Wo Contrast  05/08/2013   CLINICAL DATA:  Neck and shoulder pain. Pain resolved with prednisone. Urinary retention since Thursday.  EXAM: MRI CERVICAL SPINE WITHOUT CONTRAST  TECHNIQUE: Multiplanar, multisequence MR imaging was performed. No intravenous contrast was administered.  COMPARISON:  None.  FINDINGS: Mild straightening of the normal cervical lordosis. Bone marrow signal is within normal limits. Cervical cord shows no intra medullary lesions or edema. Motion artifact is present on the inversion recovery images. Posterior fossa structures appear normal. There are flow voids present in both vertebral arteries.  C2-C3:  Negative.  C3-C4: There is a small right posterior lateral  protrusion encroaching on the right neural foramen potentially affecting the right C4 nerve. The left neural foramen appears patent.  C4-C5:  Negative.  C5-C6: There is a small left paracentral disc protrusion producing mild central stenosis. This just indents the ventral cord. Right foraminal encroachment is present associated with uncovertebral spurring. The left neural foramen appears patent.  C6-C7: Broad-based left paracentral disc protrusion is present producing mild to moderate central stenosis with flattening of the ventral cord and indentation. AP diameter of the thecal sac is between 8 mm and 9 mm. There is left foraminal stenosis due to bulging disc and uncovertebral spurring. The right neural foramen appears patent.  C7-T1:  Negative.  IMPRESSION: 1. Mild C5-C6 central stenosis associated with left paracentral disc protrusion. Right foraminal stenosis potentially affects the right C6 nerve. 2. C6-C7 broad-based left eccentric disc protrusion producing mild to moderate central stenosis with flattening of the left ventral cord. Left foraminal stenosis potentially affects the left C7 nerve. 3. Tiny right C3-C4 posterior lateral protrusion narrows the right neural foramen potentially affecting the right C4 nerve.   Electronically Signed   By: Andreas Newport M.D.   On: 05/08/2013 13:51   Mr Thoracic Spine Wo Contrast  05/08/2013   CLINICAL DATA:  Urinary retention.  EXAM: MRI THORACIC SPINE WITHOUT CONTRAST  TECHNIQUE: Multiplanar, multisequence MR imaging was performed. No intravenous contrast was administered.  COMPARISON:  Lumbar MRI today  FINDINGS: Negative for fracture or mass in the thoracic spine. Hemangioma T5 vertebral body.  Spinal cord signal is normal.  Small central disc protrusion at T5-6 with mild flattening of the ventral surface of the cord. Mild posterior element hypertrophy and mild spinal stenosis at this level. Remaining disc spaces appear normal without stenosis or disc protrusion   IMPRESSION: Central disc protrusion and mild  spinal stenosis at C5-6. Otherwise negative.   Electronically Signed   By: Marlan Palau M.D.   On: 05/08/2013 14:09   Mr Lumbar Spine Wo Contrast  05/08/2013   CLINICAL DATA:  Back pain.  Urinary retention  EXAM: MRI LUMBAR SPINE WITHOUT CONTRAST  TECHNIQUE: Multiplanar, multisequence MR imaging was performed. No intravenous contrast was administered.  COMPARISON:  None.  FINDINGS: Normal lumbar alignment.  Negative for fracture or mass lesion.  L1-2: Extruded disc fragment with superior extension of extruded disc material. This appears to be touching the tip of the conus but not definitely compressing the conus. Given the history of urinary retention, this could be a factor.  L2-3:  Negative  L3-4: Diffuse disc bulging and mild facet degeneration. Mild spinal stenosis.  L4-5: Mild disc degeneration and disc bulging. Small left foraminal disc protrusion.  L5-S1:  Negative  IMPRESSION: Central disc protrusion at L1-2 with extruded disc fragment extending cranially. Mild displacement of the tip of the conus medullaris without definite conus compression. Given the history of urinary retention, this may be contributing.  Lumbar degenerative changes elsewhere as described above.   Electronically Signed   By: Marlan Palau M.D.   On: 05/08/2013 14:05    EKG Interpretation   None       MDM   1. Urinary retention   2. Abnormal MRI, spine    I doubt any other EMC precluding discharge at this time including, but not necessarily limited to the following:myelopathy, cauda equina syndrome, SBI. Suspect adverse med reaction.    Hurman Horn, MD 05/08/13 [phone]

## 2013-05-08 NOTE — ED Notes (Signed)
Patient has had difficulty voiding since Wednesday, no fever, no pain

## 2013-05-08 NOTE — ED Notes (Signed)
VSS, patient comfortable, ambulated to discharge area

## 2013-05-09 ENCOUNTER — Encounter: Payer: Self-pay | Admitting: Internal Medicine

## 2013-05-09 ENCOUNTER — Encounter (HOSPITAL_BASED_OUTPATIENT_CLINIC_OR_DEPARTMENT_OTHER): Payer: Self-pay | Admitting: Emergency Medicine

## 2013-05-09 ENCOUNTER — Emergency Department (HOSPITAL_BASED_OUTPATIENT_CLINIC_OR_DEPARTMENT_OTHER)
Admission: EM | Admit: 2013-05-09 | Discharge: 2013-05-09 | Disposition: A | Discharge status: Home / Self Care | Payer: 59 | Attending: Emergency Medicine | Admitting: Emergency Medicine

## 2013-05-09 DIAGNOSIS — Z872 Personal history of diseases of the skin and subcutaneous tissue: Secondary | ICD-10-CM | POA: Insufficient documentation

## 2013-05-09 DIAGNOSIS — N390 Urinary tract infection, site not specified: Secondary | ICD-10-CM | POA: Insufficient documentation

## 2013-05-09 DIAGNOSIS — I10 Essential (primary) hypertension: Secondary | ICD-10-CM | POA: Insufficient documentation

## 2013-05-09 DIAGNOSIS — Z79899 Other long term (current) drug therapy: Secondary | ICD-10-CM | POA: Insufficient documentation

## 2013-05-09 DIAGNOSIS — IMO0002 Reserved for concepts with insufficient information to code with codable children: Secondary | ICD-10-CM | POA: Insufficient documentation

## 2013-05-09 DIAGNOSIS — I2510 Atherosclerotic heart disease of native coronary artery without angina pectoris: Secondary | ICD-10-CM | POA: Insufficient documentation

## 2013-05-09 DIAGNOSIS — Z87898 Personal history of other specified conditions: Secondary | ICD-10-CM | POA: Insufficient documentation

## 2013-05-09 DIAGNOSIS — M109 Gout, unspecified: Secondary | ICD-10-CM | POA: Insufficient documentation

## 2013-05-09 DIAGNOSIS — R937 Abnormal findings on diagnostic imaging of other parts of musculoskeletal system: Secondary | ICD-10-CM | POA: Insufficient documentation

## 2013-05-09 DIAGNOSIS — Z87891 Personal history of nicotine dependence: Secondary | ICD-10-CM | POA: Insufficient documentation

## 2013-05-09 DIAGNOSIS — E785 Hyperlipidemia, unspecified: Secondary | ICD-10-CM | POA: Insufficient documentation

## 2013-05-09 DIAGNOSIS — Z9861 Coronary angioplasty status: Secondary | ICD-10-CM | POA: Insufficient documentation

## 2013-05-09 LAB — URINALYSIS, ROUTINE W REFLEX MICROSCOPIC
Bilirubin Urine: NEGATIVE
Glucose, UA: NEGATIVE mg/dL
Nitrite: NEGATIVE
Specific Gravity, Urine: 1.018 (ref 1.005–1.030)
pH: 5.5 (ref 5.0–8.0)

## 2013-05-09 LAB — URINE MICROSCOPIC-ADD ON

## 2013-05-09 MED ORDER — OXYBUTYNIN CHLORIDE ER 10 MG PO TB24: 10.0000 mg | ORAL_TABLET | Freq: Every day | ORAL | Status: DC

## 2013-05-09 MED ORDER — CEPHALEXIN 500 MG PO CAPS: 500.0000 mg | ORAL_CAPSULE | Freq: Three times a day (TID) | ORAL | Status: DC

## 2013-05-09 NOTE — ED Provider Notes (Signed)
CSN: [id number]     Arrival date & time 05/09/13  1728 History  This chart was scribed for Charles B. Bernette Mayers, MD by Dorothey Baseman, ED Scribe. This patient was seen in room MH04/MH04 and the patient's care was started at 6:22 PM.    Chief Complaint  Patient presents with  . Hematuria   The history is provided by the patient. No language interpreter was used.   HPI Comments: Charles [name] is a 52 y.o. male who presents to the Emergency Department complaining of hematuria with blood present in his catheter bag onset PTA. Seen yesterday for urinary retention of unknown cause. Neg MRI.  Patient reports feeling some bladder spasms and hematuria today. Patient reports that he has been increasing his fluid intake since onset of symptoms. He denies fever or back pain.   Past Medical History  Diagnosis Date  . Anaphylaxis     Neomycin sulfate  . Gout   . Hyperlipidemia LDL goal <100   . Hypertension   . Psoriasis   . CAD (coronary artery disease) 04/2008    successful PCI of the lesion in the proximal LAD using xience drug-eluting stent with improvement in central narrowing from 95% to 0%.  Dr. Juanda Chance   Past Surgical History  Procedure Laterality Date  . Bunionectomy    . Ganglion cyst excision    . Ptca  04/2008    Dr  Charlies Constable  . Lipoma resection  2001    L wrist  . Coronary angioplasty with stent placement     Family History  Problem Relation Age of Onset  . Hypertension Father   . Stroke Father   . Lung cancer Mother      smoker  . Diabetes Sister   . Heart disease Brother      stent LAD 2012   History  Substance Use Topics  . Smoking status: Former Smoker    Quit date: 05/27/1997  . Smokeless tobacco: Never Used  . Alcohol Use: 0.6 oz/week    1 Cans of beer per week     Comment: Infrequently    Review of Systems  A complete 10 system review of systems was obtained and all systems are negative except as noted in the HPI and PMH.   Allergies  Lidocaine;  Neomycin sulfate; Sulfa drugs cross reactors; and Hctz  Home Medications   Current Outpatient Rx  Name  Route  Sig  Dispense  Refill  . allopurinol (ZYLOPRIM) 300 MG tablet      TAKE 1 TABLET BY MOUTH EVERY DAY   90 tablet   0   . clobetasol cream (TEMOVATE) 0.05 %   Topical   Apply 1 application topically 2 (two) times daily. Apply 1 application topically 2 (two) times daily as needed   180 g   3   . fluocinonide (LIDEX) 0.05 % external solution   Topical   Apply 1 application topically 2 (two) times daily. As needed   360 mL   3   . fluocinonide cream (LIDEX) 0.05 %   Topical   Apply 1 application topically 2 (two) times daily. Apply 1 application topically 2 (two) times daily as needed   60 g   3   . FLUoxetine (PROZAC) 20 MG capsule   Oral   Take 1 capsule (20 mg total) by mouth daily.   90 capsule   0   . folic acid (FOLVITE) 1 MG tablet   Oral   Take 1 tablet (1  mg total) by mouth daily.   90 tablet   3   . metoprolol succinate (TOPROL-XL) 100 MG 24 hr tablet   Oral   Take 1 tablet (100 mg total) by mouth daily.   90 tablet   0   . Multiple Vitamin (MULTIVITAMIN) capsule   Oral   Take 1 capsule by mouth daily.           . nitroGLYCERIN (NITROSTAT) 0.3 MG SL tablet   Sublingual   Place 0.3 mg under the tongue every 5 (five) minutes as needed.           . Omega-3 Fatty Acids (FISH OIL) 1000 MG CAPS   Oral   Take 1,000 mg by mouth 2 (two) times daily.           . rosuvastatin (CRESTOR) 10 MG tablet   Oral   Take 1 tablet (10 mg total) by mouth daily.   90 tablet   3   . cyclobenzaprine (FLEXERIL) 5 MG tablet      1-2 qhs prn   14 tablet   0   . predniSONE (DELTASONE) 20 MG tablet   Oral   Take 1 tablet (20 mg total) by mouth 2 (two) times daily.   14 tablet   0   . traMADol (ULTRAM) 50 MG tablet   Oral   Take 1 tablet (50 mg total) by mouth every 6 (six) hours as needed.   30 tablet   0    Triage Vitals: BP 162/95  Pulse  85  Temp(Src) 98 F (36.7 C) (Oral)  Resp 18  Ht 5\' 8"  (1.727 m)  Wt 225 lb (102.059 kg)  BMI 34.22 kg/m2  SpO2 98%  Physical Exam  Nursing note and vitals reviewed. Constitutional: He is oriented to person, place, and time. He appears well-developed and well-nourished.  HENT:  Head: Normocephalic and atraumatic.  Eyes: EOM are normal. Pupils are equal, round, and reactive to light.  Neck: Normal range of motion. Neck supple.  Cardiovascular: Normal rate, regular rhythm, normal heart sounds and intact distal pulses.   Pulmonary/Chest: Effort normal and breath sounds normal. No respiratory distress.  Abdominal: Soft. Bowel sounds are normal. He exhibits no distension. There is no tenderness.  Genitourinary:  Catheter in place with blood tinged urine in leg bag  Musculoskeletal: Normal range of motion. He exhibits no edema and no tenderness.  Neurological: He is alert and oriented to person, place, and time. He has normal strength. No cranial nerve deficit or sensory deficit.  Skin: Skin is warm and dry. No rash noted.  Psychiatric: He has a normal mood and affect. His behavior is normal.    ED Course  Procedures (including critical care time)  DIAGNOSTIC STUDIES: Oxygen Saturation is 98% on room air, normal by my interpretation.    COORDINATION OF CARE: 6:23 PM- Ordered UA. Will discharge patient with Keflex and Ditropan to manage symptoms. Advised patient to follow up with urology. Discussed treatment plan with patient at bedside and patient verbalized agreement.     Labs Review Labs Reviewed  URINALYSIS, ROUTINE W REFLEX MICROSCOPIC   Imaging Review   EKG Interpretation   None       MDM   1. UTI (urinary tract infection)    Abx and ditropan for UTI and spasm. Pt to call Urology tomorrow for followup.   I personally performed the services described in this documentation, which was scribed in my presence. The recorded information has been reviewed  and is  accurate.       Charles B. Bernette Mayers, MD 05/09/13 [place] Fruit

## 2013-05-09 NOTE — ED Notes (Signed)
Had foley placed here yesterday- noticing blood in bag today. States has been trying to increase po fluid intake today

## 2013-05-09 NOTE — ED Notes (Signed)
called prescription to [facility] in [place] [phone] to assist patient

## 2013-05-10 ENCOUNTER — Telehealth: Payer: Self-pay | Admitting: *Deleted

## 2013-05-10 NOTE — Telephone Encounter (Signed)
Message copied by Verdie Shire on Mon May 10, 2013 10:17 AM ------      Message from: Pecola Lawless      Created: Sun May 09, 2013 12:33 PM        If Urology evaluation negative; I recommend a Neurology  consultation to assess changes on spinal MRI; please inform me if you have a physician preference.       ------

## 2013-05-10 NOTE — Telephone Encounter (Signed)
Called and spoke with the pt to see how he was doing and to let him know of Dr. Frederik Pear recommendation below.  Pt stated that he had to go back to the ER on Sat because the sxs was not letting up.  He had a cather put in to drain the the urine in his bladder and a MRI of his back was done to check for pinched nerve which was not found.  Pt stated that there was questions on if the issues was coming from the Tramadol.  He said he went back on yest because he continued to have blood in his urine.   They put him on a ATB and a antispasm med.   Pt said he has the appt today with the Urologist.//AB/CMA

## 2013-05-11 LAB — URINE CULTURE

## 2013-06-01 ENCOUNTER — Telehealth: Payer: Self-pay

## 2013-06-01 ENCOUNTER — Other Ambulatory Visit (INDEPENDENT_AMBULATORY_CARE_PROVIDER_SITE_OTHER): Payer: PRIVATE HEALTH INSURANCE

## 2013-06-01 DIAGNOSIS — Z0000 Encounter for general adult medical examination without abnormal findings: Secondary | ICD-10-CM

## 2013-06-01 LAB — BASIC METABOLIC PANEL
BUN: 11 mg/dL (ref 6–23)
CALCIUM: 9.1 mg/dL (ref 8.4–10.5)
CO2: 25 meq/L (ref 19–32)
CREATININE: 1 mg/dL (ref 0.4–1.5)
Chloride: 102 mEq/L (ref 96–112)
GFR: 85.58 mL/min (ref >60.00)
GLUCOSE: 100 mg/dL — AB (ref 70–99)
Potassium: 4.1 mEq/L (ref 3.5–5.1)
Sodium: 139 mEq/L (ref 135–145)

## 2013-06-01 LAB — LIPID PANEL
CHOL/HDL RATIO: 4
CHOLESTEROL: 172 mg/dL (ref 0–200)
HDL: 40.5 mg/dL (ref >39.00)
Triglycerides: 286 mg/dL — ABNORMAL HIGH (ref 0.0–149.0)
VLDL: 57.2 mg/dL — ABNORMAL HIGH (ref 0.0–40.0)

## 2013-06-01 LAB — CBC WITH DIFFERENTIAL/PLATELET
Basophils Absolute: 0 10*3/uL (ref 0.0–0.1)
Basophils Relative: 0.4 % (ref 0.0–3.0)
EOS ABS: 0.2 10*3/uL (ref 0.0–0.7)
Eosinophils Relative: 2.9 % (ref 0.0–5.0)
HCT: 44.2 % (ref 39.0–52.0)
Hemoglobin: 15 g/dL (ref 13.0–17.0)
LYMPHS PCT: 29.7 % (ref 12.0–46.0)
Lymphs Abs: 1.9 10*3/uL (ref 0.7–4.0)
MCHC: 34 g/dL (ref 30.0–36.0)
MCV: 90.3 fl (ref 78.0–100.0)
MONOS PCT: 7.9 % (ref 3.0–12.0)
Monocytes Absolute: 0.5 10*3/uL (ref 0.1–1.0)
NEUTROS PCT: 59.1 % (ref 43.0–77.0)
Neutro Abs: 3.7 10*3/uL (ref 1.4–7.7)
PLATELETS: 352 10*3/uL (ref 150.0–400.0)
RBC: 4.89 Mil/uL (ref 4.22–5.81)
RDW: 13.4 % (ref 11.5–14.6)
WBC: 6.3 10*3/uL (ref 4.5–10.5)

## 2013-06-01 LAB — HEPATIC FUNCTION PANEL
ALBUMIN: 4.4 g/dL (ref 3.5–5.2)
ALK PHOS: 48 U/L (ref 39–117)
ALT: 28 U/L (ref 0–53)
AST: 25 U/L (ref 0–37)
BILIRUBIN TOTAL: 0.9 mg/dL (ref 0.3–1.2)
Bilirubin, Direct: 0.1 mg/dL (ref 0.0–0.3)
Total Protein: 7.4 g/dL (ref 6.0–8.3)

## 2013-06-01 LAB — TSH: TSH: 1.07 u[IU]/mL (ref 0.35–5.50)

## 2013-06-01 LAB — LDL CHOLESTEROL, DIRECT: LDL DIRECT: 93.6 mg/dL

## 2013-06-01 NOTE — Telephone Encounter (Signed)
Left message for call back  identifiable  Flu vaccine--01/2013 Tdap--02/2010 PSA--04/2012--2.29

## 2013-06-02 ENCOUNTER — Ambulatory Visit (INDEPENDENT_AMBULATORY_CARE_PROVIDER_SITE_OTHER): Payer: PRIVATE HEALTH INSURANCE | Admitting: Internal Medicine

## 2013-06-02 ENCOUNTER — Encounter: Payer: Self-pay | Admitting: Internal Medicine

## 2013-06-02 VITALS — BP 136/88 | HR 68 | Temp 98.5°F | Resp 18 | Wt 235.0 lb

## 2013-06-02 DIAGNOSIS — Z1211 Encounter for screening for malignant neoplasm of colon: Secondary | ICD-10-CM

## 2013-06-02 DIAGNOSIS — Z87898 Personal history of other specified conditions: Secondary | ICD-10-CM

## 2013-06-02 DIAGNOSIS — E785 Hyperlipidemia, unspecified: Secondary | ICD-10-CM

## 2013-06-02 DIAGNOSIS — R937 Abnormal findings on diagnostic imaging of other parts of musculoskeletal system: Secondary | ICD-10-CM

## 2013-06-02 DIAGNOSIS — Z0000 Encounter for general adult medical examination without abnormal findings: Secondary | ICD-10-CM

## 2013-06-02 DIAGNOSIS — Z87448 Personal history of other diseases of urinary system: Secondary | ICD-10-CM

## 2013-06-02 DIAGNOSIS — E669 Obesity, unspecified: Secondary | ICD-10-CM | POA: Insufficient documentation

## 2013-06-02 MED ORDER — ALLOPURINOL 300 MG PO TABS: ORAL_TABLET | ORAL | Status: DC

## 2013-06-02 MED ORDER — FLUOCINONIDE 0.05 % EX SOLN: 1.0000 "application " | Freq: Two times a day (BID) | CUTANEOUS | Status: DC

## 2013-06-02 MED ORDER — ROSUVASTATIN CALCIUM 10 MG PO TABS: 10.0000 mg | ORAL_TABLET | Freq: Every day | ORAL | Status: DC

## 2013-06-02 MED ORDER — METOPROLOL SUCCINATE ER 100 MG PO TB24: 100.0000 mg | ORAL_TABLET | Freq: Every day | ORAL | Status: DC

## 2013-06-02 MED ORDER — NITROGLYCERIN 0.3 MG SL SUBL: 0.3000 mg | SUBLINGUAL_TABLET | SUBLINGUAL | Status: DC | PRN

## 2013-06-02 MED ORDER — CLOBETASOL PROPIONATE 0.05 % EX CREA: 1.0000 "application " | TOPICAL_CREAM | Freq: Two times a day (BID) | CUTANEOUS | Status: DC

## 2013-06-02 NOTE — Progress Notes (Signed)
   Subjective:    Patient ID: Charles BoucheRobert [name], male    DOB: Apr 29, 1961, 53 y.o.   MRN: [id number]  HPI  He is here for a physical;acute issues include recent urinary retention for which he is seeing the urologist. This occurred 05/26/13 the context of polypharmacy (tramadol, cyclobenzaprine, and fluoxetine) and multiple level disc changes on MRI.    Review of Systems  A modified heart healthy diet is followed; no regular exercise due to work demands. Family history is positive for premature coronary disease. With CAD LDL goal is less than 70 . There is medication compliance with the statin.  Full dose ASA taken Specifically denied are  chest pain, palpitations, dyspnea, or claudication.  Significant abdominal symptoms, memory deficit, or myalgias not present. No colonoscopy to date; [facility].      Objective:   Physical Exam Gen.: Healthy and well-nourished in appearance. Alert, appropriate and cooperative throughout exam.Appears younger than stated age  Head: Normocephalic without obvious abnormalities; no  alopecia  Eyes: No corneal or conjunctival inflammation noted. Pupils equal round reactive to light and accommodation. Extraocular motion intact.  Ears: External  ear exam reveals no significant lesions or deformities. Canals clear .TMs normal. Hearing is grossly normal bilaterally. Nose: External nasal exam reveals no deformity or inflammation. Nasal mucosa are pink and moist. No lesions or exudates noted.   Mouth: Oral mucosa and oropharynx reveal no lesions or exudates. Teeth in good repair. Neck: No deformities, masses, or tenderness noted. Range of motion &  Thyroid normal. Lungs: Normal respiratory effort; chest expands symmetrically. Lungs are clear to auscultation without rales, wheezes, or increased work of breathing. Heart: Normal rate and rhythm. Normal S1 and S2. No gallop, click, or rub. No murmur. Abdomen: Bowel sounds normal; abdomen soft and nontender. No masses,  organomegaly or hernias noted. Genitalia:  as per Urology                               Musculoskeletal/extremities: No deformity or scoliosis noted of  the thoracic or lumbar spine.  No clubbing, cyanosis, edema, or significant extremity  deformity noted. Range of motion normal .Tone & strength normal. Hand joints normal . Fingernail / toenail health good. Able to lie down & sit up w/o help. Negative SLR bilaterally Vascular: Carotid, radial artery, dorsalis pedis and  posterior tibial pulses are  equal. Slightly decreased DPP.No bruits present. Neurologic: Alert and oriented x3. Deep tendon reflexes symmetrical and normal.       Skin: Diffuse small psoriatic lesions . Lymph: No cervical, axillary lymphadenopathy present. Psych: Mood and affect are normal. Normally interactive                                                                                        Assessment & Plan:  #1 comprehensive physical exam; no acute findings  #2 urinary retention, improving. This was in the context of polypharmacy and multilevel MRI spine abnormalities. Neurologic assessment recommended as baseline.  Plan: see Orders  & Recommendations

## 2013-06-02 NOTE — Progress Notes (Signed)
Pre-visit discussion using our clinic review tool. No additional management support is needed unless otherwise documented below in the visit note.  

## 2013-06-02 NOTE — Patient Instructions (Addendum)
As per the Standard of Care , screening Colonoscopy recommended @ 50 & every 5-10 years thereafter . More frequent monitor would be dictated by family history or findings @ Colonoscopy.  I recommend a Neurology  consultation to determine risks long term related to MRI findings & any possible relationship to recent urinary retention. The most common cause of elevated triglycerides (TG) is the ingestion of sugar from high fructose corn syrup sources added to processed foods & drinks.  Eat a low-fat diet with lots of fruits and vegetables, up to 7-9 servings per day. Consume less than 40  Grams (preferably ZERO) of sugar per day from foods & drinks with High Fructose Corn Syrup (HFCS) sugar as #1,2,3 or # 4 on label.Whole Foods, [employer] & [employer] do not carry products with HFCS. Follow a  low carb nutrition program such as [place] or [employer]  to prevent Diabetes . Cardiovascular exercise, this can be as simple a program as walking, is recommended 30-45 minutes 3-4 times per week. If you're not exercising you should take 6-8 weeks to build up to this level.

## 2013-06-02 NOTE — Telephone Encounter (Signed)
Unable to reach pre visit.  

## 2013-06-03 ENCOUNTER — Ambulatory Visit: Payer: PRIVATE HEALTH INSURANCE

## 2013-06-03 DIAGNOSIS — R7309 Other abnormal glucose: Secondary | ICD-10-CM

## 2013-06-03 LAB — HEMOGLOBIN A1C: HEMOGLOBIN A1C: 5.9 % (ref 4.6–6.5)

## 2013-06-18 ENCOUNTER — Ambulatory Visit: Payer: Self-pay | Admitting: Neurology

## 2013-06-22 ENCOUNTER — Ambulatory Visit (INDEPENDENT_AMBULATORY_CARE_PROVIDER_SITE_OTHER): Payer: 59 | Admitting: Neurology

## 2013-06-22 ENCOUNTER — Encounter: Payer: Self-pay | Admitting: Neurology

## 2013-06-22 VITALS — BP 150/95 | HR 73 | Ht 69.0 in | Wt 238.0 lb

## 2013-06-22 DIAGNOSIS — M5137 Other intervertebral disc degeneration, lumbosacral region: Secondary | ICD-10-CM

## 2013-06-22 DIAGNOSIS — M5136 Other intervertebral disc degeneration, lumbar region: Secondary | ICD-10-CM

## 2013-06-22 DIAGNOSIS — R339 Retention of urine, unspecified: Secondary | ICD-10-CM

## 2013-06-22 NOTE — Progress Notes (Signed)
[facility]    Provider:  Dr Hosie Poisson Referring Provider: Pecola Lawless, MD Primary Care Physician:  Marga Melnick, MD  CC:  urinary retention  HPI:  Charles [name] is a 53 y.o. male here as a referral from Dr. Alwyn Ren for evaluation of urinary retention with abnormal MRI findings.  Patient notes having an active job, does a lot of heavy lifting. Injured his shoulder around Thanksgiving 2014, given multiple pain medications and muscle relaxants. After around 1 week of taking these medications he had urinary retention. Went to ED, had Foley placed for one week and then followed up with urology at which time his Foley was removed. Has been fine since then. Per patient, PCP and urology suspect urinary retention is likely multifactorial from medication and spinal stenosis.   He notes chronic history of degenerative spinal changes, has long history of pain in cervical and lumbar region. Feels this has been getting progressively worse. Has not had any acute injuries to the back, does note a history of a lot of chronic wear on his back. Denies any history of saddle anesthesia. No weakness in extremities. No gait instability, no falls or trips.    MRI imaging fully reviewed, reports below.   MRI Cervical spine: 1. 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 Right foraminal stenosis potentially affects the right  C6 nerve.  2. C6-C7 broad-based left eccentric disc protrusion producing mild  to moderate central stenosis with flattening of the left ventral  cord. Left foraminal stenosis potentially affects the left C7 nerve.  3. Tiny right C3-C4 posterior lateral protrusion narrows the right  neural foramen potentially affecting the right C4 nerve.  MRI Lumbar spine: Central disc protrusion at L1-2 with extruded disc fragment  extending cranially. Mild displacement of the tip of the conus  medullaris without definite conus compression.  Given the history of  urinary retention, this may be contributing.  Lumbar degenerative changes elsewhere as described above.  Review of Systems: Out of a complete 14 system review, the patient complains of only the following symptoms, and all other reviewed systems are negative. Positive easy bruising feeling hot snoring fatigue numbness anxiety decreased energy  History   Social History  . Marital Status: Married    Spouse Name: Charles [name]    Number of Children: 1  . Years of Education: Bachelor   Occupational History  . Field Optician, dispensing    Social History Main Topics  . Smoking status: Former Smoker    Quit date: 05/27/1997  . Smokeless tobacco: Never Used     Comment: smoked 1976-1999, up to 1 ppd  . Alcohol Use: 0.6 oz/week    1 Cans of beer per week     Comment: Infrequently  . Drug Use: No  . Sexual Activity: Not on file   Other Topics Concern  . Not on file   Social History Narrative   Patient is married to Charles [name]) has 1 child   Patient is right handed   Education level is Bachelor's degree   Caffeine consumption is 1 daily    Family History  Problem Relation Age of Onset  . Hypertension Father   . Stroke Father 24  . Lung cancer Mother      smoker  . Diabetes Sister   . Heart disease Brother 11     stent LAD 2012    Past Medical History  Diagnosis Date  . Anaphylaxis     Neomycin sulfate  . Gout   .  Hyperlipidemia LDL goal <100   . Hypertension   . Psoriasis   . CAD (coronary artery disease) 04/2008    successful PCI of the lesion in the proximal LAD using xience drug-eluting stent with improvement in central narrowing from 95% to 0%.  Dr. Juanda Chance    Past Surgical History  Procedure Laterality Date  . Bunionectomy    . Ganglion cyst excision    . Lipoma resection  2001    L wrist  . Coronary angioplasty with stent placement  2009    PTCA RCA ; Dr Juanda Chance    Current Outpatient Prescriptions  Medication Sig Dispense Refill  . allopurinol  (ZYLOPRIM) 300 MG tablet TAKE 1 TABLET BY MOUTH EVERY DAY  90 tablet  3  . clobetasol cream (TEMOVATE) 0.05 % Apply 1 application topically 2 (two) times daily. Apply 1 application topically 2 (two) times daily as needed  300 g  3  . fluocinonide (LIDEX) 0.05 % external solution Apply 1 application topically 2 (two) times daily. As needed  360 mL  3  . FLUoxetine (PROZAC) 20 MG capsule Take 1 capsule (20 mg total) by mouth daily.  90 capsule  0  . folic acid (FOLVITE) 1 MG tablet Take 1 tablet (1 mg total) by mouth daily.  90 tablet  3  . metoprolol succinate (TOPROL-XL) 100 MG 24 hr tablet Take 1 tablet (100 mg total) by mouth daily.  90 tablet  3  . Multiple Vitamin (MULTIVITAMIN) capsule Take 1 capsule by mouth daily.        . nitroGLYCERIN (NITROSTAT) 0.3 MG SL tablet Place 1 tablet (0.3 mg total) under the tongue every 5 (five) minutes as needed. 1 prn , can repeat in 5 minutes then ER  90 tablet  0  . Omega-3 Fatty Acids (FISH OIL) 1000 MG CAPS Take 1,000 mg by mouth 2 (two) times daily.        . rosuvastatin (CRESTOR) 10 MG tablet Take 1 tablet (10 mg total) by mouth daily.  90 tablet  3   No current facility-administered medications for this visit.    Allergies as of 06/22/2013 - Review Complete 06/22/2013  Allergen Reaction Noted  . Lidocaine    . Neomycin sulfate    . Sulfa drugs cross reactors Anaphylaxis 08/14/2010  . Flexeril [cyclobenzaprine]  06/02/2013  . Hctz [hydrochlorothiazide]  08/14/2010  . Tramadol  06/02/2013    Vitals: BP 150/95  Pulse 73  Ht 5\' 9"  (1.753 m)  Wt 238 lb (107.956 kg)  BMI 35.13 kg/m2 Last Weight:  Wt Readings from Last 1 Encounters:  06/22/13 238 lb (107.956 kg)   Last Height:   Ht Readings from Last 1 Encounters:  06/22/13 5\' 9"  (1.753 m)     Physical exam: Exam: Gen: NAD, conversant Eyes: anicteric sclerae, moist conjunctivae HENT: Atraumatic, oropharynx clear Neck: Trachea midline; supple,  Lungs: CTA, no wheezing, rales, rhonic                           CV: RRR, no MRG Abdomen: Soft, non-tender;  Extremities: No peripheral edema  Skin: Normal temperature, no rash,  Psych: Appropriate affect, pleasant  Neuro: MS: AA&Ox3, appropriately interactive, normal affect   Speech: fluent w/o paraphasic error  Memory: good recent and remote recall  CN: PERRL, EOMI no nystagmus, no ptosis, sensation intact to LT V1-V3 bilat, face symmetric, no weakness, hearing grossly intact, palate elevates symmetrically, shoulder shrug 5/5 bilat,  tongue  protrudes midline, no fasiculations noted.  Motor: normal bulk and tone Strength: 5/5  In all extremities  Coord: rapid alternating and point-to-point (FNF, HTS) movements intact.  Reflexes: brisk right patellar otherwise symmetrical, bilat downgoing toes  Sens: LT intact in all extremities, no saddle anesthesia  Gait: posture, stance, stride and arm-swing normal. Tandem gait intact. Able to walk on heels and toes. Romberg absent.   Assessment:  After physical and neurologic examination, review of laboratory studies, imaging, neurophysiology testing and pre-existing records, assessment will be reviewed on the problem list.  Plan:  Treatment plan and additional workup will be reviewed under Problem List.  1)Urinary retention 2)Degenerative disc disease  52y.o gentleman with history of multilevel degenerative spinal changes presenting for initial evaluation of recent onset of urinary retention. Onset of urinary retention was associated with recent addition of multiple pain medications and muscle relaxants. The medications were discontinued, a Foley catheter was placed and after one week the catheter was removed with resolution of symptoms. Physical exam is overall unremarkable. MRI imaging shows multilevel degenerative changes most notable at C5-6 and L1-2. Based on clinical history and overall normal physical exam suspect recent urinary retention was likely predominantly related  to polypharmacy. Based on imaging and history of chronic pain patient would benefit from referral to spine surgeon. [facility]. Follow up as needed. Patient counseled on warning signs of cord compression.     Elspeth ChoPeter Quanesha Klimaszewski, DO  [facility] [address] [place], [place] [id number]  Phone [phone] Fax [phone]

## 2013-06-22 NOTE — Patient Instructions (Signed)
Overall you are doing fairly well but I do want to suggest a few things today:   We will place a referral to [facility]. You should receive a call from them sometime in the next week.   Follow up as needed, sooner if we need to. Please call us with any interim questions, concerns, problems, updates or refill requests.   My clinical assistant and will answer any of your questions and relay your messages to me and also relay most of my messages to you.   Our phone number is [phone]. We also have an after hours call service for urgent matters and there is a physician on-call for urgent questions. For any emergencies you know to call 911 or go to the nearest emergency room

## 2013-07-04 ENCOUNTER — Other Ambulatory Visit: Payer: Self-pay | Admitting: Internal Medicine

## 2013-07-04 DIAGNOSIS — F410 Panic disorder [episodic paroxysmal anxiety] without agoraphobia: Secondary | ICD-10-CM

## 2013-07-05 MED ORDER — FLUOXETINE HCL 20 MG PO CAPS: 20.0000 mg | ORAL_CAPSULE | Freq: Every day | ORAL | Status: DC

## 2013-07-05 NOTE — Telephone Encounter (Signed)
OK # [phone] 

## 2013-07-05 NOTE — Telephone Encounter (Signed)
Rx sent to the pharmacy by e-script.//AB/CMA 

## 2013-07-09 NOTE — Addendum Note (Signed)
Addended by: Verdie ShireBAYNES, Mescal Flinchbaugh M on: 07/09/2013 05:06 PM   Modules accepted: Orders

## 2013-07-09 NOTE — Telephone Encounter (Signed)
Rx was denied because if was filled on (07-05-13).//AB/CMA

## 2013-07-30 ENCOUNTER — Encounter: Payer: Self-pay | Admitting: Internal Medicine

## 2013-08-13 ENCOUNTER — Encounter: Payer: Self-pay | Admitting: Internal Medicine

## 2013-08-14 ENCOUNTER — Other Ambulatory Visit: Payer: Self-pay | Admitting: Internal Medicine

## 2013-09-22 ENCOUNTER — Ambulatory Visit (AMBULATORY_SURGERY_CENTER): Payer: Self-pay

## 2013-09-22 VITALS — Ht 68.0 in | Wt 237.0 lb

## 2013-09-22 DIAGNOSIS — Z1211 Encounter for screening for malignant neoplasm of colon: Secondary | ICD-10-CM

## 2013-09-22 MED ORDER — MOVIPREP 100 G PO SOLR: 1.0000 | Freq: Once | ORAL | Status: DC

## 2013-09-22 NOTE — Progress Notes (Signed)
No allergies to eggs or soy. No diet/weight loss meds. No home oxygen. Has email.  Emmi instructions given for colonoscopy. 

## 2013-10-06 ENCOUNTER — Encounter: Payer: Self-pay | Admitting: Internal Medicine

## 2013-10-06 ENCOUNTER — Ambulatory Visit (AMBULATORY_SURGERY_CENTER): Payer: PRIVATE HEALTH INSURANCE | Admitting: Internal Medicine

## 2013-10-06 VITALS — BP 126/88 | HR 55 | Temp 97.5°F | Resp 16 | Ht 68.0 in | Wt 237.0 lb

## 2013-10-06 DIAGNOSIS — Z1211 Encounter for screening for malignant neoplasm of colon: Secondary | ICD-10-CM

## 2013-10-06 MED ORDER — SODIUM CHLORIDE 0.9 % IV SOLN: 500.0000 mL | INTRAVENOUS | Status: DC

## 2013-10-06 NOTE — Patient Instructions (Signed)
Discharge instructions given with verbal understanding. Handouts on diverticulosis and a high fiber diet. Resume previous medications. YOU HAD AN ENDOSCOPIC PROCEDURE TODAY AT THE [facility]: Refer to the procedure report that was given to you for any specific questions about what was found during the examination.  If the procedure report does not answer your questions, please call your gastroenterologist to clarify.  If you requested that your care partner not be given the details of your procedure findings, then the procedure report has been included in a sealed envelope for you to review at your convenience later.  YOU SHOULD EXPECT: Some feelings of bloating in the abdomen. Passage of more gas than usual.  Walking can help get rid of the air that was put into your GI tract during the procedure and reduce the bloating. If you had a lower endoscopy (such as a colonoscopy or flexible sigmoidoscopy) you may notice spotting of blood in your stool or on the toilet paper. If you underwent a bowel prep for your procedure, then you may not have a normal bowel movement for a few days.  DIET: Your first meal following the procedure should be a light meal and then it is ok to progress to your normal diet.  A half-sandwich or bowl of soup is an example of a good first meal.  Heavy or fried foods are harder to digest and may make you feel nauseous or bloated.  Likewise meals heavy in dairy and vegetables can cause extra gas to form and this can also increase the bloating.  Drink plenty of fluids but you should avoid alcoholic beverages for 24 hours.  ACTIVITY: Your care partner should take you home directly after the procedure.  You should plan to take it easy, moving slowly for the rest of the day.  You can resume normal activity the day after the procedure however you should NOT DRIVE or use heavy machinery for 24 hours (because of the sedation medicines used during the test).    SYMPTOMS TO REPORT  IMMEDIATELY: A gastroenterologist can be reached at any hour.  During normal business hours, 8:30 AM to 5:00 PM Monday through Friday, call [phone].  After hours and on weekends, please call the GI answering service at [phone] who will take a message and have the physician on call contact you.   Following lower endoscopy (colonoscopy or flexible sigmoidoscopy):  Excessive amounts of blood in the stool  Significant tenderness or worsening of abdominal pains  Swelling of the abdomen that is new, acute  Fever of 100F or higher FOLLOW UP: If any biopsies were taken you will be contacted by phone or by letter within the next 1-3 weeks.  Call your gastroenterologist if you have not heard about the biopsies in 3 weeks.  Our staff will call the home number listed on your records the next business day following your procedure to check on you and address any questions or concerns that you may have at that time regarding the information given to you following your procedure. This is a courtesy call and so if there is no answer at the home number and we have not heard from you through the emergency physician on call, we will assume that you have returned to your regular daily activities without incident.  SIGNATURES/CONFIDENTIALITY: You and/or your care partner have signed paperwork which will be entered into your electronic medical record.  These signatures attest to the fact that that the information above on your After   Visit Summary has been reviewed and is understood.  Full responsibility of the confidentiality of this discharge information lies with you and/or your care-partner. 

## 2013-10-06 NOTE — Progress Notes (Signed)
A/ox3 pleased with MAC, report to Celia RN 

## 2013-10-06 NOTE — Op Note (Signed)
[facility] [address] [place], [id number]   COLONOSCOPY PROCEDURE REPORT  PATIENT: Charles [name], Charles [name]  MR#: [id number] BIRTHDATE: 1961/01/18 , 52  yrs. old GENDER: Male ENDOSCOPIST: Roxy CedarJohn N Perry Jr, MD REFERRED XB:[id number]:[name] Alwyn RenHopper, M.D. PROCEDURE DATE:  10/06/2013 PROCEDURE:   Colonoscopy, screening First Screening Colonoscopy - Avg.  risk and is 50 yrs.  old or older Yes.  Prior Negative Screening - Now for repeat screening. N/A  History of Adenoma - Now for follow-up colonoscopy & has been > or = to 3 yrs.  N/A  Polyps Removed Today? No.  Recommend repeat exam, <10 yrs? No. ASA CLASS:   Class II INDICATIONS:average risk screening. MEDICATIONS: MAC sedation, administered by CRNA and propofol (Diprivan) 360mg  IV  DESCRIPTION OF PROCEDURE:   After the risks benefits and alternatives of the procedure were thoroughly explained, informed consent was obtained.  A digital rectal exam revealed no abnormalities of the rectum.   The LB [id number]  endoscope was introduced through the anus and advanced to the cecum, which was identified by both the appendix and ileocecal valve. No adverse events experienced.   The quality of the prep was excellent, using MoviPrep  The instrument was then slowly withdrawn as the colon was fully examined.   COLON FINDINGS: Moderate diverticulosis was noted in the sigmoid colon.   The colon was otherwise normal.  There was no inflammation, polyps or cancers unless previously stated. Retroflexed views revealed internal hemorrhoids. The time to cecum=3 minutes 39 seconds.  Withdrawal time=11 minutes 53 seconds. The scope was withdrawn and the procedure completed. COMPLICATIONS: There were no complications.  ENDOSCOPIC IMPRESSION: 1.   Moderate diverticulosis was noted in the sigmoid colon 2.   The colon was otherwise normal  RECOMMENDATIONS: 1. Continue current colorectal screening recommendations for "routine risk" patients  with a repeat colonoscopy in 10 years.   eSigned:  Roxy CedarJohn N Perry Jr, MD 10/06/2013 9:15 AM   cc: Pecola LawlessWilliam F Hopper, MD and The Patient

## 2013-10-07 ENCOUNTER — Telehealth: Payer: Self-pay

## 2013-10-07 NOTE — Telephone Encounter (Signed)
Left message on answering machine. 

## 2013-11-22 ENCOUNTER — Other Ambulatory Visit: Payer: Self-pay

## 2013-11-22 MED ORDER — FLUOCINONIDE 0.05 % EX SOLN: 1.0000 "application " | Freq: Two times a day (BID) | CUTANEOUS | Status: DC

## 2013-11-22 MED ORDER — CLOBETASOL PROPIONATE 0.05 % EX CREA: 1.0000 "application " | TOPICAL_CREAM | Freq: Two times a day (BID) | CUTANEOUS | Status: DC

## 2014-01-23 ENCOUNTER — Other Ambulatory Visit: Payer: Self-pay | Admitting: Internal Medicine

## 2014-01-24 NOTE — Telephone Encounter (Signed)
90

## 2014-03-25 ENCOUNTER — Other Ambulatory Visit: Payer: Self-pay | Admitting: Internal Medicine

## 2014-03-28 NOTE — Telephone Encounter (Signed)
2 months sent

## 2014-03-28 NOTE — Telephone Encounter (Signed)
OK X 2 mos Needs F/U 05/2014

## 2014-06-13 ENCOUNTER — Encounter: Payer: Self-pay | Admitting: Internal Medicine

## 2014-06-13 ENCOUNTER — Ambulatory Visit (INDEPENDENT_AMBULATORY_CARE_PROVIDER_SITE_OTHER): Payer: PRIVATE HEALTH INSURANCE | Admitting: Internal Medicine

## 2014-06-13 ENCOUNTER — Telehealth: Payer: Self-pay | Admitting: Internal Medicine

## 2014-06-13 ENCOUNTER — Other Ambulatory Visit (INDEPENDENT_AMBULATORY_CARE_PROVIDER_SITE_OTHER): Payer: PRIVATE HEALTH INSURANCE

## 2014-06-13 VITALS — BP 148/105 | HR 67 | Temp 98.3°F | Resp 16 | Ht 68.0 in | Wt 234.1 lb

## 2014-06-13 DIAGNOSIS — Z8639 Personal history of other endocrine, nutritional and metabolic disease: Secondary | ICD-10-CM

## 2014-06-13 DIAGNOSIS — E785 Hyperlipidemia, unspecified: Secondary | ICD-10-CM

## 2014-06-13 DIAGNOSIS — Z0189 Encounter for other specified special examinations: Secondary | ICD-10-CM | POA: Diagnosis not present

## 2014-06-13 DIAGNOSIS — M7662 Achilles tendinitis, left leg: Secondary | ICD-10-CM | POA: Diagnosis not present

## 2014-06-13 DIAGNOSIS — I10 Essential (primary) hypertension: Secondary | ICD-10-CM

## 2014-06-13 DIAGNOSIS — Z8739 Personal history of other diseases of the musculoskeletal system and connective tissue: Secondary | ICD-10-CM

## 2014-06-13 DIAGNOSIS — Z0000 Encounter for general adult medical examination without abnormal findings: Secondary | ICD-10-CM

## 2014-06-13 LAB — BASIC METABOLIC PANEL
BUN: 15 mg/dL (ref 6–23)
CO2: 30 mEq/L (ref 19–32)
CREATININE: 1.07 mg/dL (ref 0.40–1.50)
Calcium: 9.7 mg/dL (ref 8.4–10.5)
Chloride: 102 mEq/L (ref 96–112)
GFR: 76.12 mL/min (ref >60.00)
GLUCOSE: 98 mg/dL (ref 70–99)
Potassium: 4.7 mEq/L (ref 3.5–5.1)
Sodium: 139 mEq/L (ref 135–145)

## 2014-06-13 LAB — CBC WITH DIFFERENTIAL/PLATELET
BASOS ABS: 0 10*3/uL (ref 0.0–0.1)
BASOS PCT: 0.4 % (ref 0.0–3.0)
Eosinophils Absolute: 0.3 10*3/uL (ref 0.0–0.7)
Eosinophils Relative: 4.1 % (ref 0.0–5.0)
HEMATOCRIT: 47.1 % (ref 39.0–52.0)
Hemoglobin: 15.7 g/dL (ref 13.0–17.0)
LYMPHS ABS: 2.7 10*3/uL (ref 0.7–4.0)
LYMPHS PCT: 31.5 % (ref 12.0–46.0)
MCHC: 33.3 g/dL (ref 30.0–36.0)
MCV: 90.8 fl (ref 78.0–100.0)
MONOS PCT: 7.3 % (ref 3.0–12.0)
Monocytes Absolute: 0.6 10*3/uL (ref 0.1–1.0)
NEUTROS PCT: 56.7 % (ref 43.0–77.0)
Neutro Abs: 4.8 10*3/uL (ref 1.4–7.7)
PLATELETS: 345 10*3/uL (ref 150.0–400.0)
RBC: 5.18 Mil/uL (ref 4.22–5.81)
RDW: 13.2 % (ref 11.5–15.5)
WBC: 8.4 10*3/uL (ref 4.0–10.5)

## 2014-06-13 LAB — URIC ACID: Uric Acid, Serum: 6.5 mg/dL (ref 4.0–7.8)

## 2014-06-13 LAB — HEPATIC FUNCTION PANEL
ALT: 33 U/L (ref 0–53)
AST: 29 U/L (ref 0–37)
Albumin: 4.5 g/dL (ref 3.5–5.2)
Alkaline Phosphatase: 60 U/L (ref 39–117)
BILIRUBIN DIRECT: 0.1 mg/dL (ref 0.0–0.3)
BILIRUBIN TOTAL: 0.6 mg/dL (ref 0.2–1.2)
Total Protein: 7.2 g/dL (ref 6.0–8.3)

## 2014-06-13 LAB — LIPID PANEL
CHOLESTEROL: 155 mg/dL (ref 0–200)
HDL: 40.8 mg/dL (ref >39.00)
NonHDL: 114.2
Total CHOL/HDL Ratio: 4
Triglycerides: 252 mg/dL — ABNORMAL HIGH (ref 0.0–149.0)
VLDL: 50.4 mg/dL — ABNORMAL HIGH (ref 0.0–40.0)

## 2014-06-13 LAB — TSH: TSH: 1.31 u[IU]/mL (ref 0.35–4.50)

## 2014-06-13 LAB — LDL CHOLESTEROL, DIRECT: Direct LDL: 81 mg/dL

## 2014-06-13 LAB — PSA: PSA: 3.02 ng/mL (ref 0.10–4.00)

## 2014-06-13 MED ORDER — AMLODIPINE BESYLATE 5 MG PO TABS: 5.0000 mg | ORAL_TABLET | Freq: Every day | ORAL | Status: DC

## 2014-06-13 NOTE — Patient Instructions (Signed)
Minimal Blood Pressure Goal= AVERAGE < 140/90;  Ideal is an AVERAGE < 135/85. This AVERAGE should be calculated from @ least 5-7 BP readings taken @ different times of day on different days of week. You should not respond to isolated BP readings , but rather the AVERAGE for that week .Please bring your  blood pressure cuff to office visits to verify that it is reliable.It  can also be checked against the blood pressure device at the pharmacy. Finger or wrist cuffs are not dependable; an arm cuff is.  Fill the  prescription for the BP medication if BP NOT @ goal based on  7 to 14 day average.  Plain Mucinex (NOT D) for thick secretions ;force NON dairy fluids .   Nasal cleansing in the shower as discussed with lather of mild shampoo.After 10 seconds wash off lather while  exhaling through nostrils. Make sure that all residual soap is removed to prevent irritation.  Flonase OR Nasacort AQ 1 spray in each nostril twice a day as needed. Use the "crossover" technique into opposite nostril spraying toward opposite ear @ 45 degree angle, not straight up into nostril.  Plain Allegra (NOT D )  160 daily , Loratidine 10 mg , OR Zyrtec 10 mg @ bedtime  as needed for itchy eyes & sneezing. 

## 2014-06-13 NOTE — Progress Notes (Signed)
Subjective:    Patient ID: Charles BoucheRobert [name], male    DOB: [birth year], 54 y.o.   MRN: [id number]  HPI   He is here for a physical;acute issues include L Achilles tendon pain.  He is on reduced carb nutrition program. He is not exercising due to tendon injury of the left foot  He has been compliant with his blood pressure medicine &  statin without adverse effect. Not monitoring BP @ home.  He denies active cardiopulmonary symptoms at this time  He does have some spinal stenosis at the cervical and lumbar regions. He has no associated symptoms with this at this time. He has undergone physical therapy for this. Surgery was not recommended  Colonoscopy is up-to-date.   Review of Systems   Significant headaches, epistaxis, chest pain, palpitations, exertional dyspnea, claudication, paroxysmal nocturnal dyspnea, or edema absent. No GI symptoms , memory loss or myalgias Unexplained weight loss not present. No significant headaches. Blurred vision , diplopia or vision loss absent. Vertigo, near syncope or imbalance denied. There is no numbness, tingling, or weakness in extremities.   No loss of control of bladder or bowels. Radicular type pain absent. No seizure stigmata.  Post nasal drainage & sinus congestion an issue recently.Frontal headache, facial pain , nasal purulence, dental pain, sore throat , otic pain or otic discharge denied. No fever , chills or sweats.     Objective:   Physical Exam  Gen.: Healthy and well-nourished in appearance. Alert, appropriate and cooperative throughout exam. Appears younger than stated age  Head: Normocephalic without obvious abnormalities  Eyes: No corneal or conjunctival inflammation noted. Pupils equal round reactive to light and accommodation. Extraocular motion intact.  Ears: External  ear exam reveals no significant lesions or deformities. Canals clear .TMs normal. Hearing is grossly normal bilaterally. Nose: External nasal exam reveals  no deformity or inflammation. Nasal mucosa are pink and moist. No lesions or exudates noted.   Mouth: Oral mucosa and oropharynx reveal no lesions or exudates. Teeth in good repair. Neck: No deformities, masses, or tenderness noted. Range of motion &. Thyroid normal Lungs: Normal respiratory effort; chest expands symmetrically. Lungs are clear to auscultation without rales, wheezes, or increased work of breathing. Heart: Normal rate and rhythm. Normal S1 and S2. No gallop, click, or rub. No murmur. Abdomen: Protuberant. Bowel sounds normal; abdomen soft and nontender. No masses, organomegaly or hernias noted. Genitalia:Genitalia normal except for left varices. Prostate is not enlarged, asymmetric or indurated. Central tiny soft papule suggested.            Musculoskeletal/extremities: No deformity or scoliosis noted of  the thoracic or lumbar spine.  No clubbing, cyanosis, edema, or significant extremity  deformity noted.  L Achilles tendon slightly tender to palpation Range of motion normal . Tone & strength normal. Hand joints normal Fingernail  health good. Crepitus of knees.  Able to lie down & sit up w/o help.  Negative SLR bilaterally Vascular: Carotid, radial artery, dorsalis pedis and  posterior tibial pulses are full and equal. No bruits present. Neurologic: Alert and oriented x3. Deep tendon reflexes symmetrical and normal.  Gait normal      Skin: Intact without suspicious lesions or rashes. Lymph: No cervical, axillary, or inguinal lymphadenopathy present. Psych: Mood and affect are normal. Normally interactive  Assessment & Plan:  #1 comprehensive physical exam; no acute findings #2 Achilles tendonitis #3 non allergic rhinitis  Plan: see Orders  & Recommendations

## 2014-06-13 NOTE — Telephone Encounter (Signed)
Dr. Alwyn RenHopper office called to ask if pt can come in tomorrow to have TB appointment. The location is convenient. Scheduled patient is this ok.

## 2014-06-13 NOTE — Telephone Encounter (Signed)
Yes

## 2014-06-13 NOTE — Progress Notes (Signed)
Pre visit review using our clinic review tool, if applicable. No additional management support is needed unless otherwise documented below in the visit note. 

## 2014-06-14 ENCOUNTER — Ambulatory Visit (INDEPENDENT_AMBULATORY_CARE_PROVIDER_SITE_OTHER): Payer: PRIVATE HEALTH INSURANCE

## 2014-06-14 DIAGNOSIS — Z111 Encounter for screening for respiratory tuberculosis: Secondary | ICD-10-CM | POA: Diagnosis not present

## 2014-06-14 NOTE — Progress Notes (Signed)
Pre visit review using our clinic review tool, if applicable. No additional management support is needed unless otherwise documented below in the visit note. 

## 2014-06-14 NOTE — Progress Notes (Signed)
Tb solution successfully placed.  Pt tolerated injection well.  No signs of a reaction.

## 2014-06-16 LAB — TB SKIN TEST
Induration: 0 mm
TB Skin Test: NEGATIVE

## 2014-06-27 ENCOUNTER — Ambulatory Visit: Payer: PRIVATE HEALTH INSURANCE | Admitting: Family Medicine

## 2014-07-04 ENCOUNTER — Other Ambulatory Visit: Payer: Self-pay

## 2014-07-04 MED ORDER — ALLOPURINOL 300 MG PO TABS: ORAL_TABLET | ORAL | Status: DC

## 2014-07-04 MED ORDER — NITROGLYCERIN 0.3 MG SL SUBL: 0.3000 mg | SUBLINGUAL_TABLET | SUBLINGUAL | Status: DC | PRN

## 2014-07-04 MED ORDER — METOPROLOL SUCCINATE ER 100 MG PO TB24: 100.0000 mg | ORAL_TABLET | Freq: Every day | ORAL | Status: DC

## 2014-07-16 ENCOUNTER — Emergency Department
Admission: EM | Admit: 2014-07-16 | Discharge: 2014-07-16 | Disposition: A | Discharge status: Home / Self Care | Payer: PRIVATE HEALTH INSURANCE | Source: Home / Self Care | Attending: Family Medicine | Admitting: Family Medicine

## 2014-07-16 ENCOUNTER — Emergency Department (INDEPENDENT_AMBULATORY_CARE_PROVIDER_SITE_OTHER): Payer: PRIVATE HEALTH INSURANCE

## 2014-07-16 DIAGNOSIS — J209 Acute bronchitis, unspecified: Secondary | ICD-10-CM

## 2014-07-16 DIAGNOSIS — R05 Cough: Secondary | ICD-10-CM

## 2014-07-16 DIAGNOSIS — R0602 Shortness of breath: Secondary | ICD-10-CM | POA: Diagnosis not present

## 2014-07-16 MED ORDER — ALBUTEROL SULFATE HFA 108 (90 BASE) MCG/ACT IN AERS: 1.0000 | INHALATION_SPRAY | Freq: Four times a day (QID) | RESPIRATORY_TRACT | Status: DC | PRN

## 2014-07-16 MED ORDER — IPRATROPIUM BROMIDE 0.06 % NA SOLN: 2.0000 | Freq: Four times a day (QID) | NASAL | Status: DC

## 2014-07-16 MED ORDER — PREDNISONE 10 MG PO TABS: 30.0000 mg | ORAL_TABLET | Freq: Every day | ORAL | Status: DC

## 2014-07-16 MED ORDER — IPRATROPIUM-ALBUTEROL 0.5-2.5 (3) MG/3ML IN SOLN
3.0000 mL | Freq: Once | RESPIRATORY_TRACT | Status: AC
  Administered 2014-07-16: 3 mL via RESPIRATORY_TRACT

## 2014-07-16 MED ORDER — GUAIFENESIN-CODEINE 100-10 MG/5ML PO SOLN
5.0000 mL | Freq: Every evening | ORAL | Status: DC | PRN
Start: 2014-07-16 — End: 2014-08-14

## 2014-07-16 NOTE — ED Notes (Signed)
Pt c/o productive cough with mucous x3 weeks. States afebrile. Has tried mucinex.

## 2014-07-16 NOTE — Discharge Instructions (Signed)
Thank you for coming in today. °Call or go to the emergency room if you get worse, have trouble breathing, have chest pains, or palpitations.  ° °Acute Bronchitis °Bronchitis is inflammation of the airways that extend from the windpipe into the lungs (bronchi). The inflammation often causes mucus to develop. This leads to a cough, which is the most common symptom of bronchitis.  °In acute bronchitis, the condition usually develops suddenly and goes away over time, usually in a couple weeks. Smoking, allergies, and asthma can make bronchitis worse. Repeated episodes of bronchitis may cause further lung problems.  °CAUSES °Acute bronchitis is most often caused by the same virus that causes a cold. The virus can spread from person to person (contagious) through coughing, sneezing, and touching contaminated objects. °SIGNS AND SYMPTOMS  °· Cough.   °· Fever.   °· Coughing up mucus.   °· Body aches.   °· Chest congestion.   °· Chills.   °· Shortness of breath.   °· Sore throat.   °DIAGNOSIS  °Acute bronchitis is usually diagnosed through a physical exam. Your health care provider will also ask you questions about your medical history. Tests, such as chest X-rays, are sometimes done to rule out other conditions.  °TREATMENT  °Acute bronchitis usually goes away in a couple weeks. Oftentimes, no medical treatment is necessary. Medicines are sometimes given for relief of fever or cough. Antibiotic medicines are usually not needed but may be prescribed in certain situations. In some cases, an inhaler may be recommended to help reduce shortness of breath and control the cough. A cool mist vaporizer may also be used to help thin bronchial secretions and make it easier to clear the chest.  °HOME CARE INSTRUCTIONS °· Get plenty of rest.   °· Drink enough fluids to keep your urine clear or pale yellow (unless you have a medical condition that requires fluid restriction). Increasing fluids may help thin your respiratory secretions  (sputum) and reduce chest congestion, and it will prevent dehydration.   °· Take medicines only as directed by your health care provider. °· If you were prescribed an antibiotic medicine, finish it all even if you start to feel better. °· Avoid smoking and secondhand smoke. Exposure to cigarette smoke or irritating chemicals will make bronchitis worse. If you are a smoker, consider using nicotine gum or skin patches to help control withdrawal symptoms. Quitting smoking will help your lungs heal faster.   °· Reduce the chances of another bout of acute bronchitis by washing your hands frequently, avoiding people with cold symptoms, and trying not to touch your hands to your mouth, nose, or eyes.   °· Keep all follow-up visits as directed by your health care provider.   °SEEK MEDICAL CARE IF: °Your symptoms do not improve after 1 week of treatment.  °SEEK IMMEDIATE MEDICAL CARE IF: °· You develop an increased fever or chills.   °· You have chest pain.   °· You have severe shortness of breath. °· You have bloody sputum.   °· You develop dehydration. °· You faint or repeatedly feel like you are going to pass out. °· You develop repeated vomiting. °· You develop a severe headache. °MAKE SURE YOU:  °· Understand these instructions. °· Will watch your condition. °· Will get help right away if you are not doing well or get worse. °Document Released: 06/20/2004 Document Revised: 09/27/2013 Document Reviewed: 11/03/2012 °ExitCare® Patient Information ©2015 [employer]. This information is not intended to replace advice given to you by your health care provider. Make sure you discuss any questions you have with your   health care provider. ° °

## 2014-07-16 NOTE — ED Provider Notes (Signed)
Charles BoucheRobert [name] is a 54 y.o. male who presents to Urgent Care today for cough. Patient has a 3 week history of productive cough associated with wheezing and chest tightness and occasional shortness of breath. No vomiting or diarrhea. No abdominal pain or chest pain. Patient has tried over-the-counter medications which have helped.   Past Medical History  Diagnosis Date  . Anaphylaxis     Neomycin sulfate  . Gout   . Hyperlipidemia LDL goal <100   . Hypertension   . Psoriasis   . CAD (coronary artery disease) 04/2008    successful PCI of the lesion in the proximal LAD using xience drug-eluting stent with improvement in central narrowing from 95% to 0%.  Dr. Juanda ChanceBrodie   Past Surgical History  Procedure Laterality Date  . Bunionectomy    . Ganglion cyst excision    . Lipoma resection  2001    L wrist  . Coronary angioplasty with stent placement  2009    PTCA RCA ; Dr Juanda ChanceBrodie  . Colonoscopy  07/2013    negative   History  Substance Use Topics  . Smoking status: Former Smoker    Quit date: 05/27/1997  . Smokeless tobacco: Former NeurosurgeonUser     Comment: smoked [phone], up to 1 ppd  . Alcohol Use: 0.6 oz/week    1 Cans of beer per week     Comment: Infrequently   ROS as above Medications: No current facility-administered medications for this encounter.   Current Outpatient Prescriptions  Medication Sig Dispense Refill  . albuterol (PROVENTIL HFA;VENTOLIN HFA) 108 (90 BASE) MCG/ACT inhaler Inhale 1-2 puffs into the lungs every 6 (six) hours as needed for wheezing or shortness of breath. 1 Inhaler 0  . allopurinol (ZYLOPRIM) 300 MG tablet TAKE 1 TABLET BY MOUTH EVERY DAY 90 tablet 1  . amLODipine (NORVASC) 5 MG tablet Take 1 tablet (5 mg total) by mouth daily. 30 tablet 5  . aspirin 325 MG EC tablet Take 325 mg by mouth daily.    . clobetasol cream (TEMOVATE) 0.05 % Apply 1 application topically 2 (two) times daily. Apply 1 application topically 2 (two) times daily as needed 300 g 3  .  Coenzyme Q10 (CO Q 10 PO) Take by mouth. 500mg     . CRESTOR 10 MG tablet TAKE 1 TABLET DAILY 90 tablet 1  . fluocinonide (LIDEX) 0.05 % external solution Apply 1 application topically 2 (two) times daily. As needed 360 mL 3  . FLUoxetine (PROZAC) 20 MG capsule TAKE 1 CAPSULE DAILY 30 capsule 1  . guaiFENesin-codeine 100-10 MG/5ML syrup Take 5 mLs by mouth at bedtime as needed for cough. 120 mL 0  . ibuprofen (ADVIL,MOTRIN) 200 MG tablet Take 200 mg by mouth every 6 (six) hours as needed. 2 tabs=400mg  per dose for muscle aches    . ipratropium (ATROVENT) 0.06 % nasal spray Place 2 sprays into both nostrils 4 (four) times daily. 15 mL 1  . metoprolol succinate (TOPROL-XL) 100 MG 24 hr tablet Take 1 tablet (100 mg total) by mouth daily. 90 tablet 1  . Multiple Vitamin (MULTIVITAMIN) capsule Take 1 capsule by mouth daily.      . nitroGLYCERIN (NITROSTAT) 0.3 MG SL tablet Place 1 tablet (0.3 mg total) under the tongue every 5 (five) minutes as needed. 1 prn , can repeat in 5 minutes then ER 90 tablet 0  . Omega-3 Fatty Acids (FISH OIL) 1000 MG CAPS Take 1,000 mg by mouth daily.     . predniSONE (  DELTASONE) 10 MG tablet Take 3 tablets (30 mg total) by mouth daily. 15 tablet 0   Allergies  Allergen Reactions  . Lidocaine     REACTION: anaphylactic shock (also he was on Neomycin sulfate)  . Neomycin Sulfate     REACTION: anaphylactic shock (also on Lidocaine  topically)  . Sulfa Drugs Cross Reactors Anaphylaxis  . Flexeril [Cyclobenzaprine]     05/26/13 urinary retention  . Hctz [Hydrochlorothiazide]     Gout   . Tramadol     05/26/13 urinary retention     Exam:  BP 162/102 mmHg  Pulse 78  Temp(Src) 98.1 F (36.7 C) (Oral)  Wt 226 lb (102.513 kg)  SpO2 95% Gen: Well NAD HEENT: EOMI,  MMM posterior pharynx with cobblestoning. Normal tympanic membranes bilaterally Lungs: Normal work of breathing. CTABL Heart: RRR no MRG Abd: NABS, Soft. Nondistended, Nontender Exts: Brisk capillary  refill, warm and well perfused.   Patient was given a 2.5/0.5 mg DuoNeb nebulizer treatment, and felt better  No results found for this or any previous visit (from the past 24 hour(s)). Dg Chest 2 View  07/16/2014   CLINICAL DATA:  Three weeks of cough and shortness of breath  EXAM: CHEST  2 VIEW  COMPARISON:  None.  FINDINGS: The heart size and mediastinal contours are within normal limits. Both lungs are clear. The visualized skeletal structures are unremarkable.  IMPRESSION: No active cardiopulmonary disease.   Electronically Signed   By: Signa Kell M.D.   On: 07/16/2014 10:42    Assessment and Plan: 54 y.o. male with bronchitis. Treat with prednisone and albuterol Atrovent nasal spray and codeine cough syrup. Return as needed.  Discussed warning signs or symptoms. Please see discharge instructions. Patient expresses understanding.     Rodolph Bong, MD 07/16/14 [phone]

## 2014-07-25 ENCOUNTER — Encounter (HOSPITAL_BASED_OUTPATIENT_CLINIC_OR_DEPARTMENT_OTHER): Payer: Self-pay | Admitting: Emergency Medicine

## 2014-07-25 ENCOUNTER — Emergency Department (HOSPITAL_BASED_OUTPATIENT_CLINIC_OR_DEPARTMENT_OTHER)
Admission: EM | Admit: 2014-07-25 | Discharge: 2014-07-25 | Disposition: A | Discharge status: Home / Self Care | Payer: 59 | Attending: Emergency Medicine | Admitting: Emergency Medicine

## 2014-07-25 DIAGNOSIS — I10 Essential (primary) hypertension: Secondary | ICD-10-CM | POA: Insufficient documentation

## 2014-07-25 DIAGNOSIS — Z87891 Personal history of nicotine dependence: Secondary | ICD-10-CM | POA: Insufficient documentation

## 2014-07-25 DIAGNOSIS — Z79899 Other long term (current) drug therapy: Secondary | ICD-10-CM | POA: Diagnosis not present

## 2014-07-25 DIAGNOSIS — Z8639 Personal history of other endocrine, nutritional and metabolic disease: Secondary | ICD-10-CM | POA: Diagnosis not present

## 2014-07-25 DIAGNOSIS — R339 Retention of urine, unspecified: Secondary | ICD-10-CM | POA: Diagnosis not present

## 2014-07-25 DIAGNOSIS — Z7982 Long term (current) use of aspirin: Secondary | ICD-10-CM | POA: Insufficient documentation

## 2014-07-25 DIAGNOSIS — Z7952 Long term (current) use of systemic steroids: Secondary | ICD-10-CM | POA: Insufficient documentation

## 2014-07-25 DIAGNOSIS — M109 Gout, unspecified: Secondary | ICD-10-CM | POA: Diagnosis not present

## 2014-07-25 DIAGNOSIS — I2510 Atherosclerotic heart disease of native coronary artery without angina pectoris: Secondary | ICD-10-CM | POA: Diagnosis not present

## 2014-07-25 LAB — URINALYSIS, ROUTINE W REFLEX MICROSCOPIC
Bilirubin Urine: NEGATIVE
Glucose, UA: NEGATIVE mg/dL
HGB URINE DIPSTICK: NEGATIVE
Ketones, ur: NEGATIVE mg/dL
Leukocytes, UA: NEGATIVE
NITRITE: NEGATIVE
Protein, ur: NEGATIVE mg/dL
Specific Gravity, Urine: 1.016 (ref 1.005–1.030)
Urobilinogen, UA: 0.2 mg/dL (ref 0.0–1.0)
pH: 6 (ref 5.0–8.0)

## 2014-07-25 LAB — BASIC METABOLIC PANEL
Anion gap: 6 (ref 5–15)
BUN: 21 mg/dL (ref 6–23)
CO2: 23 mmol/L (ref 19–32)
Calcium: 8.5 mg/dL (ref 8.4–10.5)
Chloride: 105 mmol/L (ref 96–112)
Creatinine, Ser: 0.94 mg/dL (ref 0.50–1.35)
GFR calc Af Amer: >90 mL/min (ref >90)
GFR calc non Af Amer: >90 mL/min (ref >90)
GLUCOSE: 111 mg/dL — AB (ref 70–99)
POTASSIUM: 3.9 mmol/L (ref 3.5–5.1)
Sodium: 134 mmol/L — ABNORMAL LOW (ref 135–145)

## 2014-07-25 MED ORDER — TAMSULOSIN HCL 0.4 MG PO CAPS: 0.4000 mg | ORAL_CAPSULE | Freq: Every day | ORAL | Status: DC

## 2014-07-25 MED ORDER — TAMSULOSIN HCL 0.4 MG PO CAPS
0.4000 mg | ORAL_CAPSULE | Freq: Every day | ORAL | Status: DC
  Administered 2014-07-25: 0.4 mg via ORAL
  Filled 2014-07-25: qty 1

## 2014-07-25 MED ORDER — DOXYCYCLINE HYCLATE 100 MG PO CAPS: 100.0000 mg | ORAL_CAPSULE | Freq: Two times a day (BID) | ORAL | Status: DC

## 2014-07-25 NOTE — Discharge Instructions (Signed)
Acute Urinary Retention °Acute urinary retention is the temporary inability to urinate. °This is a common problem in older men. As men age their prostates become larger and block the flow of urine from the bladder. This is usually a problem that has come on gradually.  °HOME CARE INSTRUCTIONS °If you are sent home with a Foley catheter and a drainage system, you will need to discuss the best course of action with your health care provider. While the catheter is in, maintain a good intake of fluids. Keep the drainage bag emptied and lower than your catheter. This is so that contaminated urine will not flow back into your bladder, which could lead to a urinary tract infection. °There are two main types of drainage bags. One is a large bag that usually is used at night. It has a good capacity that will allow you to sleep through the night without having to empty it. The second type is called a leg bag. It has a smaller capacity, so it needs to be emptied more frequently. However, the main advantage is that it can be attached by a leg strap and can go underneath your clothing, allowing you the freedom to move about or leave your home. °Only take over-the-counter or prescription medicines for pain, discomfort, or fever as directed by your health care provider.  °SEEK MEDICAL CARE IF: °· You develop a low-grade fever. °· You experience spasms or leakage of urine with the spasms. °SEEK IMMEDIATE MEDICAL CARE IF:  °· You develop chills or fever. °· Your catheter stops draining urine. °· Your catheter falls out. °· You start to develop increased bleeding that does not respond to rest and increased fluid intake. °MAKE SURE YOU: °· Understand these instructions. °· Will watch your condition. °· Will get help right away if you are not doing well or get worse. °Document Released: 08/19/2000 Document Revised: 05/18/2013 Document Reviewed: 10/22/2012 °ExitCare® Patient Information ©2015 [employer]. This information is not  intended to replace advice given to you by your health care provider. Make sure you discuss any questions you have with your health care provider. ° °

## 2014-07-25 NOTE — ED Provider Notes (Signed)
CSN: [id number]     Arrival date & time 07/25/14  4098 History   First MD Initiated Contact with Patient 07/25/14 [id number]     Chief Complaint  Patient presents with  . Urine Output     (Consider location/radiation/quality/duration/timing/severity/associated sxs/prior Treatment) Patient is a 54 y.o. male presenting with frequency. The history is provided by the patient.  Urinary Frequency This is a recurrent problem. The current episode started 12 to 24 hours ago. The problem occurs constantly. The problem has not changed since onset.Pertinent negatives include no headaches. Nothing aggravates the symptoms. Nothing relieves the symptoms. He has tried nothing for the symptoms. The treatment provided no relief.  Urges to void frequently since yesterday but minimal to no output when the sensation occurs.    Past Medical History  Diagnosis Date  . Anaphylaxis     Neomycin sulfate  . Gout   . Hyperlipidemia LDL goal <100   . Hypertension   . Psoriasis   . CAD (coronary artery disease) 04/2008    successful PCI of the lesion in the proximal LAD using xience drug-eluting stent with improvement in central narrowing from 95% to 0%.  Dr. Juanda Chance   Past Surgical History  Procedure Laterality Date  . Bunionectomy    . Ganglion cyst excision    . Lipoma resection  2001    L wrist  . Coronary angioplasty with stent placement  2009    PTCA RCA ; Dr Juanda Chance  . Colonoscopy  07/2013    negative   Family History  Problem Relation Age of Onset  . Hypertension Father   . Stroke Father 57  . Lung cancer Mother      smoker  . Diabetes Sister   . Heart disease Brother 49     stent LAD 2012  . Colon cancer Neg Hx   . Pancreatic cancer Neg Hx   . Rectal cancer Neg Hx   . Stomach cancer Neg Hx    History  Substance Use Topics  . Smoking status: Former Smoker    Quit date: 05/27/1997  . Smokeless tobacco: Former Neurosurgeon     Comment: smoked [phone], up to 1 ppd  . Alcohol Use: 0.6 oz/week    1  Cans of beer per week     Comment: Infrequently    Review of Systems  Genitourinary: Positive for frequency and difficulty urinating. Negative for dysuria and flank pain.  Neurological: Negative for headaches.  All other systems reviewed and are negative.     Allergies  Lidocaine; Neomycin sulfate; Sulfa drugs cross reactors; Flexeril; Hctz; and Tramadol  Home Medications   Prior to Admission medications   Medication Sig Start Date End Date Taking? Authorizing Provider  albuterol (PROVENTIL HFA;VENTOLIN HFA) 108 (90 BASE) MCG/ACT inhaler Inhale 1-2 puffs into the lungs every 6 (six) hours as needed for wheezing or shortness of breath. 07/16/14   Rodolph Bong, MD  allopurinol (ZYLOPRIM) 300 MG tablet TAKE 1 TABLET BY MOUTH EVERY DAY 07/04/14   Pecola Lawless, MD  amLODipine (NORVASC) 5 MG tablet Take 1 tablet (5 mg total) by mouth daily. 06/13/14   Pecola Lawless, MD  aspirin 325 MG EC tablet Take 325 mg by mouth daily.    Historical Provider, MD  clobetasol cream (TEMOVATE) 0.05 % Apply 1 application topically 2 (two) times daily. Apply 1 application topically 2 (two) times daily as needed 11/22/13   Pecola Lawless, MD  Coenzyme Q10 (CO Q 10 PO) Take by  mouth. 500mg     Historical Provider, MD  CRESTOR 10 MG tablet TAKE 1 TABLET DAILY 03/28/14   Pecola LawlessWilliam F Hopper, MD  fluocinonide (LIDEX) 0.05 % external solution Apply 1 application topically 2 (two) times daily. As needed 11/22/13   Pecola LawlessWilliam F Hopper, MD  FLUoxetine (PROZAC) 20 MG capsule TAKE 1 CAPSULE DAILY 03/28/14   Pecola LawlessWilliam F Hopper, MD  guaiFENesin-codeine 100-10 MG/5ML syrup Take 5 mLs by mouth at bedtime as needed for cough. 07/16/14   Rodolph BongEvan S Corey, MD  ibuprofen (ADVIL,MOTRIN) 200 MG tablet Take 200 mg by mouth every 6 (six) hours as needed. 2 tabs=400mg  per dose for muscle aches    Historical Provider, MD  ipratropium (ATROVENT) 0.06 % nasal spray Place 2 sprays into both nostrils 4 (four) times daily. 07/16/14   Rodolph BongEvan S Corey, MD   metoprolol succinate (TOPROL-XL) 100 MG 24 hr tablet Take 1 tablet (100 mg total) by mouth daily. 07/04/14   Pecola LawlessWilliam F Hopper, MD  Multiple Vitamin (MULTIVITAMIN) capsule Take 1 capsule by mouth daily.      Historical Provider, MD  nitroGLYCERIN (NITROSTAT) 0.3 MG SL tablet Place 1 tablet (0.3 mg total) under the tongue every 5 (five) minutes as needed. 1 prn , can repeat in 5 minutes then ER 07/04/14   Pecola LawlessWilliam F Hopper, MD  Omega-3 Fatty Acids (FISH OIL) 1000 MG CAPS Take 1,000 mg by mouth daily.     Historical Provider, MD  predniSONE (DELTASONE) 10 MG tablet Take 3 tablets (30 mg total) by mouth daily. 07/16/14   Rodolph BongEvan S Corey, MD   BP 162/105 mmHg  Pulse 95  Temp(Src) 97.8 F (36.6 C) (Oral)  Resp 18  SpO2 96% Physical Exam  Constitutional: He is oriented to person, place, and time. He appears well-developed and well-nourished. No distress.  HENT:  Head: Normocephalic and atraumatic.  Mouth/Throat: Oropharynx is clear and moist.  Eyes: Conjunctivae are normal. Pupils are equal, round, and reactive to light.  Neck: Normal range of motion. Neck supple.  Cardiovascular: Normal rate, regular rhythm and intact distal pulses.   Pulmonary/Chest: Effort normal and breath sounds normal. No respiratory distress. He has no wheezes. He has no rales.  Abdominal: Soft. Bowel sounds are normal. There is no tenderness. There is no rebound and no guarding.  Musculoskeletal: Normal range of motion.  Neurological: He is alert and oriented to person, place, and time.  Skin: Skin is warm and dry.  Psychiatric: He has a normal mood and affect.    ED Course  Procedures (including critical care time) Labs Review Labs Reviewed  URINALYSIS, ROUTINE W REFLEX MICROSCOPIC  BASIC METABOLIC PANEL    Imaging Review No results found.   EKG Interpretation None      MDM   Final diagnoses:  None    Doxycycline and flomax x 7 days follow up in the urology office for voiding trial in seven days. Strict  return precautions given    Sten Dematteo K Naamah Boggess-Rasch, MD 07/25/14 [phone]

## 2014-07-25 NOTE — ED Notes (Signed)
Pt reports unable to void and describes urgency frequncy and pressure

## 2014-08-08 ENCOUNTER — Emergency Department (HOSPITAL_BASED_OUTPATIENT_CLINIC_OR_DEPARTMENT_OTHER)
Admission: EM | Admit: 2014-08-08 | Discharge: 2014-08-09 | Disposition: A | Discharge status: Home / Self Care | Payer: 59 | Attending: Emergency Medicine | Admitting: Emergency Medicine

## 2014-08-08 ENCOUNTER — Encounter (HOSPITAL_BASED_OUTPATIENT_CLINIC_OR_DEPARTMENT_OTHER): Payer: Self-pay | Admitting: Emergency Medicine

## 2014-08-08 DIAGNOSIS — Z7982 Long term (current) use of aspirin: Secondary | ICD-10-CM | POA: Insufficient documentation

## 2014-08-08 DIAGNOSIS — M109 Gout, unspecified: Secondary | ICD-10-CM | POA: Insufficient documentation

## 2014-08-08 DIAGNOSIS — R339 Retention of urine, unspecified: Secondary | ICD-10-CM | POA: Insufficient documentation

## 2014-08-08 DIAGNOSIS — Z9861 Coronary angioplasty status: Secondary | ICD-10-CM | POA: Insufficient documentation

## 2014-08-08 DIAGNOSIS — I10 Essential (primary) hypertension: Secondary | ICD-10-CM | POA: Insufficient documentation

## 2014-08-08 DIAGNOSIS — Z872 Personal history of diseases of the skin and subcutaneous tissue: Secondary | ICD-10-CM | POA: Insufficient documentation

## 2014-08-08 DIAGNOSIS — E785 Hyperlipidemia, unspecified: Secondary | ICD-10-CM | POA: Insufficient documentation

## 2014-08-08 DIAGNOSIS — Z79899 Other long term (current) drug therapy: Secondary | ICD-10-CM | POA: Diagnosis not present

## 2014-08-08 DIAGNOSIS — Z7951 Long term (current) use of inhaled steroids: Secondary | ICD-10-CM | POA: Diagnosis not present

## 2014-08-08 DIAGNOSIS — I2510 Atherosclerotic heart disease of native coronary artery without angina pectoris: Secondary | ICD-10-CM | POA: Diagnosis not present

## 2014-08-08 DIAGNOSIS — Z9889 Other specified postprocedural states: Secondary | ICD-10-CM | POA: Insufficient documentation

## 2014-08-08 DIAGNOSIS — Z87891 Personal history of nicotine dependence: Secondary | ICD-10-CM | POA: Insufficient documentation

## 2014-08-08 LAB — URINALYSIS, ROUTINE W REFLEX MICROSCOPIC
BILIRUBIN URINE: NEGATIVE
GLUCOSE, UA: NEGATIVE mg/dL
Ketones, ur: NEGATIVE mg/dL
Nitrite: NEGATIVE
Protein, ur: NEGATIVE mg/dL
Specific Gravity, Urine: 1.009 (ref 1.005–1.030)
UROBILINOGEN UA: 0.2 mg/dL (ref 0.0–1.0)
pH: 5.5 (ref 5.0–8.0)

## 2014-08-08 LAB — URINE MICROSCOPIC-ADD ON

## 2014-08-08 NOTE — ED Notes (Signed)
Has had urinary retention about 1hr ago after taking his cath out at 930 with urology.  Long history of this issue.

## 2014-08-08 NOTE — ED Provider Notes (Signed)
CSN: [id number]     Arrival date & time 08/08/14  2108 History  This chart was scribed for Glynn Octave, MD by Freida Busman, ED Scribe. This patient was seen in room MH01/MH01 and the patient's care was started 10:18 PM.    Chief Complaint  Patient presents with  . Urinary Retention    The history is provided by the patient. No language interpreter was used.     HPI Comments:  Charles [name] is a 54 y.o. male who presents to the Emergency Department complaining of urinary retention since ~ 2000 tonight. Pt reports a  h/o same. He had cath removed today ~0930 this am at [facility] urology which had been placed 5 days ago. He returned at 1500 for follow up and states he was able to urinate without difficulty at that time and was discharged without another catheter but advised to be evaluated in the ED in the event retention occurred.  He denies abdominal pain but states he feels full. He also denies fever, SOB, diaphoresis, penile pain and pain to his BLE. No alleviating factors noted.   Past Medical History  Diagnosis Date  . Anaphylaxis     Neomycin sulfate  . Gout   . Hyperlipidemia LDL goal <100   . Hypertension   . Psoriasis   . CAD (coronary artery disease) 04/2008    successful PCI of the lesion in the proximal LAD using xience drug-eluting stent with improvement in central narrowing from 95% to 0%.  Dr. Juanda Chance   Past Surgical History  Procedure Laterality Date  . Bunionectomy    . Ganglion cyst excision    . Lipoma resection  2001    L wrist  . Coronary angioplasty with stent placement  2009    PTCA RCA ; Dr Juanda Chance  . Colonoscopy  07/2013    negative   Family History  Problem Relation Age of Onset  . Hypertension Father   . Stroke Father 86  . Lung cancer Mother      smoker  . Diabetes Sister   . Heart disease Brother 49     stent LAD 2012  . Colon cancer Neg Hx   . Pancreatic cancer Neg Hx   . Rectal cancer Neg Hx   . Stomach cancer Neg Hx    History   Substance Use Topics  . Smoking status: Former Smoker    Quit date: 05/27/1997  . Smokeless tobacco: Former Neurosurgeon     Comment: smoked [phone], up to 1 ppd  . Alcohol Use: 0.6 oz/week    1 Cans of beer per week     Comment: Infrequently    Review of Systems  Constitutional: Negative for fever and diaphoresis.  Respiratory: Negative for shortness of breath.   Gastrointestinal: Negative for abdominal pain.  Genitourinary: Positive for difficulty urinating. Negative for penile pain.  All other systems reviewed and are negative.     Allergies  Lidocaine; Neomycin sulfate; Sulfa drugs cross reactors; Flexeril; Hctz; and Tramadol  Home Medications   Prior to Admission medications   Medication Sig Start Date End Date Taking? Authorizing Provider  Silodosin (RAPAFLO PO) Take by mouth.   Yes Historical Provider, MD  albuterol (PROVENTIL HFA;VENTOLIN HFA) 108 (90 BASE) MCG/ACT inhaler Inhale 1-2 puffs into the lungs every 6 (six) hours as needed for wheezing or shortness of breath. 07/16/14   Rodolph Bong, MD  allopurinol (ZYLOPRIM) 300 MG tablet TAKE 1 TABLET BY MOUTH EVERY DAY 07/04/14   Titus Dubin  Alwyn Ren, MD  amLODipine (NORVASC) 5 MG tablet Take 1 tablet (5 mg total) by mouth daily. 06/13/14   Pecola Lawless, MD  aspirin 325 MG EC tablet Take 325 mg by mouth daily.    Historical Provider, MD  clobetasol cream (TEMOVATE) 0.05 % Apply 1 application topically 2 (two) times daily. Apply 1 application topically 2 (two) times daily as needed 11/22/13   Pecola Lawless, MD  Coenzyme Q10 (CO Q 10 PO) Take by mouth.     Historical Provider, MD  CRESTOR 10 MG tablet TAKE 1 TABLET DAILY 03/28/14   Pecola Lawless, MD  doxycycline (VIBRAMYCIN) 100 MG capsule Take 1 capsule (100 mg total) by mouth 2 (two) times daily. One po bid x 7 days 07/25/14   April Palumbo, MD  fluocinonide (LIDEX) 0.05 % external solution Apply 1 application topically 2 (two) times daily. As needed 11/22/13   Pecola Lawless, MD  FLUoxetine (PROZAC) 20 MG capsule TAKE 1 CAPSULE DAILY 03/28/14   Pecola Lawless, MD  guaiFENesin-codeine 100-10 MG/5ML syrup Take 5 mLs by mouth at bedtime as needed for cough. 07/16/14   Rodolph Bong, MD  ibuprofen (ADVIL,MOTRIN) 200 MG tablet Take 200 mg by mouth every 6 (six) hours as needed. 2 tabs=400mg  per dose for muscle aches    Historical Provider, MD  ipratropium (ATROVENT) 0.06 % nasal spray Place 2 sprays into both nostrils 4 (four) times daily. 07/16/14   Rodolph Bong, MD  metoprolol succinate (TOPROL-XL) 100 MG 24 hr tablet Take 1 tablet (100 mg total) by mouth daily. 07/04/14   Pecola Lawless, MD  Multiple Vitamin (MULTIVITAMIN) capsule Take 1 capsule by mouth daily.      Historical Provider, MD  nitroGLYCERIN (NITROSTAT) 0.3 MG SL tablet Place 1 tablet (0.3 mg total) under the tongue every 5 (five) minutes as needed. 1 prn , can repeat in 5 minutes then ER 07/04/14   Pecola Lawless, MD  Omega-3 Fatty Acids (FISH OIL) 1000 MG CAPS Take 1,000 mg by mouth daily.     Historical Provider, MD  predniSONE (DELTASONE) 10 MG tablet Take 3 tablets (30 mg total) by mouth daily. 07/16/14   Rodolph Bong, MD  tamsulosin (FLOMAX) 0.4 MG CAPS capsule Take 1 capsule (0.4 mg total) by mouth daily. 07/25/14   April Palumbo, MD   BP 130/79 mmHg  Pulse 73  Temp(Src) 98.5 F (36.9 C) (Oral)  Resp 18  Ht  (1.727 m)  Wt 223 lb (101.152 kg)  BMI 33.91 kg/m2  SpO2 95% Physical Exam  Constitutional: He is oriented to person, place, and time. He appears well-developed and well-nourished. No distress.  HENT:  Head: Normocephalic and atraumatic.  Mouth/Throat: Oropharynx is clear and moist. No oropharyngeal exudate.  Eyes: Conjunctivae and EOM are normal. Pupils are equal, round, and reactive to light.  Neck: Normal range of motion. Neck supple.  No meningismus.  Cardiovascular: Normal rate, regular rhythm, normal heart sounds and intact distal pulses.   No murmur  heard. Pulmonary/Chest: Effort normal and breath sounds normal. No respiratory distress.  Abdominal: Soft. There is no tenderness. There is no rebound and no guarding.  Genitourinary:  Distended bladder. Testicles non-tender  Musculoskeletal: Normal range of motion. He exhibits no edema or tenderness.  Neurological: He is alert and oriented to person, place, and time. No cranial nerve deficit. He exhibits normal muscle tone. Coordination normal.  No ataxia on finger to nose bilaterally. No pronator drift. 5/5  strength throughout. CN 2-12 intact. Negative Romberg. Equal grip strength. Sensation intact. Gait is normal.   Skin: Skin is warm.  Psychiatric: He has a normal mood and affect. His behavior is normal.  Nursing note and vitals reviewed.   ED Course  Procedures   DIAGNOSTIC STUDIES:  Oxygen Saturation is 98% on RA, normal by my interpretation.    COORDINATION OF CARE:  10:27 PM Will re-insert catheter.Discussed treatment plan with pt at bedside and pt agreed to plan.  Labs Review Labs Reviewed  URINALYSIS, ROUTINE W REFLEX MICROSCOPIC - Abnormal; Notable for the following:    Hgb urine dipstick SMALL (*)    Leukocytes, UA TRACE (*)    All other components within normal limits  BASIC METABOLIC PANEL - Abnormal; Notable for the following:    Glucose, Bld 118 (*)    All other components within normal limits  URINE MICROSCOPIC-ADD ON    Imaging Review No results found.   EKG Interpretation None      MDM   Final diagnoses:  Urinary retention   Urinary retention after Foley catheter removed this morning. Recurrent problem. No vomiting or abdominal pain.  Foley catheter placed  with 550 cc of output. No UTI.  Creatinine normal.  Patient already on sildosin.  Follow up with urology. Return precautions discussed.   I personally performed the services described in this documentation, which was scribed in my presence. The recorded information has been reviewed and  is accurate.   Glynn OctaveStephen Lucy Woolever, MD 08/09/14 [phone]

## 2014-08-09 LAB — BASIC METABOLIC PANEL
Anion gap: 10 (ref 5–15)
BUN: 19 mg/dL (ref 6–23)
CO2: 23 mmol/L (ref 19–32)
Calcium: 9.1 mg/dL (ref 8.4–10.5)
Chloride: 105 mmol/L (ref 96–112)
Creatinine, Ser: 0.86 mg/dL (ref 0.50–1.35)
GFR calc Af Amer: >90 mL/min (ref >90)
GFR calc non Af Amer: >90 mL/min (ref >90)
Glucose, Bld: 118 mg/dL — ABNORMAL HIGH (ref 70–99)
Potassium: 3.8 mmol/L (ref 3.5–5.1)
Sodium: 138 mmol/L (ref 135–145)

## 2014-08-09 NOTE — Discharge Instructions (Signed)
Acute Urinary Retention °Acute urinary retention is the temporary inability to urinate. °This is a common problem in older men. As men age their prostates become larger and block the flow of urine from the bladder. This is usually a problem that has come on gradually.  °HOME CARE INSTRUCTIONS °If you are sent home with a Foley catheter and a drainage system, you will need to discuss the best course of action with your health care provider. While the catheter is in, maintain a good intake of fluids. Keep the drainage bag emptied and lower than your catheter. This is so that contaminated urine will not flow back into your bladder, which could lead to a urinary tract infection. °There are two main types of drainage bags. One is a large bag that usually is used at night. It has a good capacity that will allow you to sleep through the night without having to empty it. The second type is called a leg bag. It has a smaller capacity, so it needs to be emptied more frequently. However, the main advantage is that it can be attached by a leg strap and can go underneath your clothing, allowing you the freedom to move about or leave your home. °Only take over-the-counter or prescription medicines for pain, discomfort, or fever as directed by your health care provider.  °SEEK MEDICAL CARE IF: °· You develop a low-grade fever. °· You experience spasms or leakage of urine with the spasms. °SEEK IMMEDIATE MEDICAL CARE IF:  °· You develop chills or fever. °· Your catheter stops draining urine. °· Your catheter falls out. °· You start to develop increased bleeding that does not respond to rest and increased fluid intake. °MAKE SURE YOU: °· Understand these instructions. °· Will watch your condition. °· Will get help right away if you are not doing well or get worse. °Document Released: 08/19/2000 Document Revised: 05/18/2013 Document Reviewed: 10/22/2012 °ExitCare® Patient Information ©2015 [employer]. This information is not  intended to replace advice given to you by your health care provider. Make sure you discuss any questions you have with your health care provider. ° °

## 2014-08-09 NOTE — ED Notes (Signed)
Pt foley draining WNLclear yellow leg bag applied per EMT.

## 2014-08-13 ENCOUNTER — Emergency Department (HOSPITAL_BASED_OUTPATIENT_CLINIC_OR_DEPARTMENT_OTHER): Payer: PRIVATE HEALTH INSURANCE

## 2014-08-13 ENCOUNTER — Inpatient Hospital Stay (HOSPITAL_BASED_OUTPATIENT_CLINIC_OR_DEPARTMENT_OTHER)
Admission: EM | Admit: 2014-08-13 | Discharge: 2014-08-16 | DRG: 872 | Disposition: A | Discharge status: Home / Self Care | Payer: PRIVATE HEALTH INSURANCE | Attending: Internal Medicine | Admitting: Internal Medicine

## 2014-08-13 DIAGNOSIS — R319 Hematuria, unspecified: Secondary | ICD-10-CM | POA: Diagnosis present

## 2014-08-13 DIAGNOSIS — B961 Klebsiella pneumoniae [K. pneumoniae] as the cause of diseases classified elsewhere: Secondary | ICD-10-CM | POA: Diagnosis present

## 2014-08-13 DIAGNOSIS — Z7982 Long term (current) use of aspirin: Secondary | ICD-10-CM

## 2014-08-13 DIAGNOSIS — Z87891 Personal history of nicotine dependence: Secondary | ICD-10-CM

## 2014-08-13 DIAGNOSIS — Z87898 Personal history of other specified conditions: Secondary | ICD-10-CM | POA: Diagnosis present

## 2014-08-13 DIAGNOSIS — Z882 Allergy status to sulfonamides status: Secondary | ICD-10-CM

## 2014-08-13 DIAGNOSIS — T8351XA Infection and inflammatory reaction due to indwelling urinary catheter, initial encounter: Secondary | ICD-10-CM | POA: Diagnosis present

## 2014-08-13 DIAGNOSIS — Z7952 Long term (current) use of systemic steroids: Secondary | ICD-10-CM

## 2014-08-13 DIAGNOSIS — N390 Urinary tract infection, site not specified: Secondary | ICD-10-CM | POA: Diagnosis present

## 2014-08-13 DIAGNOSIS — I10 Essential (primary) hypertension: Secondary | ICD-10-CM | POA: Diagnosis present

## 2014-08-13 DIAGNOSIS — A419 Sepsis, unspecified organism: Secondary | ICD-10-CM | POA: Diagnosis present

## 2014-08-13 DIAGNOSIS — Z955 Presence of coronary angioplasty implant and graft: Secondary | ICD-10-CM

## 2014-08-13 DIAGNOSIS — I959 Hypotension, unspecified: Secondary | ICD-10-CM | POA: Diagnosis present

## 2014-08-13 DIAGNOSIS — I2510 Atherosclerotic heart disease of native coronary artery without angina pectoris: Secondary | ICD-10-CM | POA: Diagnosis present

## 2014-08-13 DIAGNOSIS — M109 Gout, unspecified: Secondary | ICD-10-CM | POA: Diagnosis present

## 2014-08-13 DIAGNOSIS — A4189 Other specified sepsis: Secondary | ICD-10-CM | POA: Insufficient documentation

## 2014-08-13 DIAGNOSIS — Y846 Urinary catheterization as the cause of abnormal reaction of the patient, or of later complication, without mention of misadventure at the time of the procedure: Secondary | ICD-10-CM | POA: Diagnosis present

## 2014-08-13 DIAGNOSIS — E785 Hyperlipidemia, unspecified: Secondary | ICD-10-CM | POA: Diagnosis present

## 2014-08-13 DIAGNOSIS — R509 Fever, unspecified: Secondary | ICD-10-CM | POA: Diagnosis not present

## 2014-08-13 DIAGNOSIS — J40 Bronchitis, not specified as acute or chronic: Secondary | ICD-10-CM | POA: Diagnosis present

## 2014-08-13 DIAGNOSIS — R109 Unspecified abdominal pain: Secondary | ICD-10-CM

## 2014-08-13 DIAGNOSIS — N401 Enlarged prostate with lower urinary tract symptoms: Secondary | ICD-10-CM | POA: Diagnosis present

## 2014-08-13 LAB — I-STAT CG4 LACTIC ACID, ED: LACTIC ACID, VENOUS: 2.62 mmol/L — AB (ref 0.5–2.0)

## 2014-08-13 LAB — BASIC METABOLIC PANEL
ANION GAP: 11 (ref 5–15)
BUN: 23 mg/dL (ref 6–23)
CO2: 24 mmol/L (ref 19–32)
Calcium: 8.9 mg/dL (ref 8.4–10.5)
Chloride: 101 mmol/L (ref 96–112)
Creatinine, Ser: 1.15 mg/dL (ref 0.50–1.35)
GFR calc Af Amer: 81 mL/min — ABNORMAL LOW (ref >90)
GFR, EST NON AFRICAN AMERICAN: 70 mL/min — AB (ref >90)
Glucose, Bld: 125 mg/dL — ABNORMAL HIGH (ref 70–99)
Potassium: 3.9 mmol/L (ref 3.5–5.1)
Sodium: 136 mmol/L (ref 135–145)

## 2014-08-13 LAB — CBC WITH DIFFERENTIAL/PLATELET
BASOS PCT: 0 % (ref 0–1)
Basophils Absolute: 0 10*3/uL (ref 0.0–0.1)
EOS ABS: 0.2 10*3/uL (ref 0.0–0.7)
Eosinophils Relative: 2 % (ref 0–5)
HCT: 43.8 % (ref 39.0–52.0)
HEMOGLOBIN: 14.7 g/dL (ref 13.0–17.0)
Lymphocytes Relative: 6 % — ABNORMAL LOW (ref 12–46)
Lymphs Abs: 0.7 10*3/uL (ref 0.7–4.0)
MCH: 30.2 pg (ref 26.0–34.0)
MCHC: 33.6 g/dL (ref 30.0–36.0)
MCV: 90.1 fL (ref 78.0–100.0)
MONOS PCT: 3 % (ref 3–12)
Monocytes Absolute: 0.3 10*3/uL (ref 0.1–1.0)
NEUTROS PCT: 89 % — AB (ref 43–77)
Neutro Abs: 9 10*3/uL — ABNORMAL HIGH (ref 1.7–7.7)
Platelets: 254 10*3/uL (ref 150–400)
RBC: 4.86 MIL/uL (ref 4.22–5.81)
RDW: 12.9 % (ref 11.5–15.5)
WBC: 10.1 10*3/uL (ref 4.0–10.5)

## 2014-08-13 LAB — URINALYSIS, ROUTINE W REFLEX MICROSCOPIC
Bilirubin Urine: NEGATIVE
Glucose, UA: NEGATIVE mg/dL
Ketones, ur: NEGATIVE mg/dL
NITRITE: POSITIVE — AB
Protein, ur: 100 mg/dL — AB
Specific Gravity, Urine: 1.018 (ref 1.005–1.030)
Urobilinogen, UA: 1 mg/dL (ref 0.0–1.0)
pH: 5.5 (ref 5.0–8.0)

## 2014-08-13 LAB — URINE MICROSCOPIC-ADD ON

## 2014-08-13 MED ORDER — SODIUM CHLORIDE 0.9 % IV BOLUS (SEPSIS)
1000.0000 mL | Freq: Once | INTRAVENOUS | Status: AC
  Administered 2014-08-13: 1000 mL via INTRAVENOUS

## 2014-08-13 MED ORDER — ACETAMINOPHEN 325 MG PO TABS
650.0000 mg | ORAL_TABLET | Freq: Once | ORAL | Status: AC
  Administered 2014-08-13: 650 mg via ORAL
  Filled 2014-08-13: qty 2

## 2014-08-13 MED ORDER — SODIUM CHLORIDE 0.9 % IV BOLUS (SEPSIS)
1000.0000 mL | INTRAVENOUS | Status: AC
  Administered 2014-08-13 – 2014-08-14 (×3): 1000 mL via INTRAVENOUS

## 2014-08-13 MED ORDER — CEFTRIAXONE SODIUM 1 G IJ SOLR
INTRAMUSCULAR | Status: AC
  Filled 2014-08-13: qty 10

## 2014-08-13 MED ORDER — ONDANSETRON HCL 4 MG/2ML IJ SOLN
4.0000 mg | Freq: Once | INTRAMUSCULAR | Status: AC
  Administered 2014-08-13: 4 mg via INTRAVENOUS
  Filled 2014-08-13: qty 2

## 2014-08-13 MED ORDER — DEXTROSE 5 % IV SOLN
1.0000 g | Freq: Once | INTRAVENOUS | Status: AC
  Administered 2014-08-13: 1 g via INTRAVENOUS

## 2014-08-13 NOTE — ED Notes (Signed)
Pt reports chills fever weakness and body aches after cycstocopy and multiple foley cath insertions

## 2014-08-13 NOTE — ED Provider Notes (Signed)
CSN: [id number]     Arrival date & time 08/13/14  2010 History  This chart was scribed for Clydia Llano, MD by Tonye Royalty, ED Scribe. This patient was seen in room 2H15C/2H15C-01 and the patient's care was started at 9:50 PM.    Chief Complaint  Patient presents with  . Urinary Tract Infection   The history is provided by the patient. No language interpreter was used.    HPI Comments: Abby Stines is a 54 y.o. male with foley catheter who presents to the Emergency Department complaining of fever and chills with onset tonight. He was seen here for urinary retention 5 days ago, then saw his urologist yesterday. Foley was replaced and had testing that revealed decreased flow as well as restriction in his prostate; he also had cystoscopy. He discussed treatment options with his doctor and has appointment for another consult in 4 days. He came in today because he starting to have chills, fever, body aches, pale hands, and headache at 1800 tonight. Temperature measured at 101 tympanic and 99 oral. Urine has been dark since yesterday and notes some blood yesterday. He states these symptoms are new. He states he also has back pain, which he has had intermittently due to constipation. He states he moved his bowels today. He has had cough and notes bronchitis with onset 4 weeks ago. He denies testicular pain.   Past Medical History  Diagnosis Date  . Anaphylaxis     Neomycin sulfate  . Gout   . Hyperlipidemia LDL goal <100   . Hypertension   . Psoriasis   . CAD (coronary artery disease) 04/2008    successful PCI of the lesion in the proximal LAD using xience drug-eluting stent with improvement in central narrowing from 95% to 0%.  Dr. Juanda Chance   Past Surgical History  Procedure Laterality Date  . Bunionectomy    . Ganglion cyst excision    . Lipoma resection  2001    L wrist  . Coronary angioplasty with stent placement  2009    PTCA RCA ; Dr Juanda Chance  . Colonoscopy  07/2013    negative    Family History  Problem Relation Age of Onset  . Hypertension Father   . Stroke Father 28  . Lung cancer Mother      smoker  . Diabetes Sister   . Heart disease Brother 49     stent LAD 2012  . Colon cancer Neg Hx   . Pancreatic cancer Neg Hx   . Rectal cancer Neg Hx   . Stomach cancer Neg Hx    History  Substance Use Topics  . Smoking status: Former Smoker    Quit date: 05/27/1997  . Smokeless tobacco: Former Neurosurgeon     Comment: smoked [phone], up to 1 ppd  . Alcohol Use: 0.6 oz/week    1 Cans of beer per week     Comment: Infrequently    Review of Systems A complete 10 system review of systems was obtained and all systems are negative except as noted in the HPI and PMH.    Allergies  Lidocaine; Neomycin sulfate; Sulfa drugs cross reactors; Flexeril; Hctz; and Tramadol  Home Medications   Prior to Admission medications   Medication Sig Start Date End Date Taking? Authorizing Provider  albuterol (PROVENTIL HFA;VENTOLIN HFA) 108 (90 BASE) MCG/ACT inhaler Inhale 1-2 puffs into the lungs every 6 (six) hours as needed for wheezing or shortness of breath. 07/16/14   Rodolph Bong, MD  allopurinol (ZYLOPRIM) 300 MG tablet TAKE 1 TABLET BY MOUTH EVERY DAY 07/04/14   Pecola Lawless, MD  amLODipine (NORVASC) 5 MG tablet Take 1 tablet (5 mg total) by mouth daily. 06/13/14   Pecola Lawless, MD  aspirin 325 MG EC tablet Take 325 mg by mouth daily.    Historical Provider, MD  clobetasol cream (TEMOVATE) 0.05 % Apply 1 application topically 2 (two) times daily. Apply 1 application topically 2 (two) times daily as needed 11/22/13   Pecola Lawless, MD  Coenzyme Q10 (CO Q 10 PO) Take by mouth.     Historical Provider, MD  CRESTOR 10 MG tablet TAKE 1 TABLET DAILY 03/28/14   Pecola Lawless, MD  doxycycline (VIBRAMYCIN) 100 MG capsule Take 1 capsule (100 mg total) by mouth 2 (two) times daily. One po bid x 7 days 07/25/14   April Palumbo, MD  fluocinonide (LIDEX) 0.05 % external  solution Apply 1 application topically 2 (two) times daily. As needed 11/22/13   Pecola Lawless, MD  FLUoxetine (PROZAC) 20 MG capsule TAKE 1 CAPSULE DAILY 03/28/14   Pecola Lawless, MD  guaiFENesin-codeine 100-10 MG/5ML syrup Take 5 mLs by mouth at bedtime as needed for cough. 07/16/14   Rodolph Bong, MD  ibuprofen (ADVIL,MOTRIN) 200 MG tablet Take 200 mg by mouth every 6 (six) hours as needed. 2 tabs=400mg  per dose for muscle aches    Historical Provider, MD  ipratropium (ATROVENT) 0.06 % nasal spray Place 2 sprays into both nostrils 4 (four) times daily. 07/16/14   Rodolph Bong, MD  levofloxacin (LEVAQUIN) 500 MG tablet Take 1 tablet (500 mg total) by mouth daily. 08/14/14   Glynn Octave, MD  metoprolol succinate (TOPROL-XL) 100 MG 24 hr tablet Take 1 tablet (100 mg total) by mouth daily. 07/04/14   Pecola Lawless, MD  Multiple Vitamin (MULTIVITAMIN) capsule Take 1 capsule by mouth daily.      Historical Provider, MD  nitroGLYCERIN (NITROSTAT) 0.3 MG SL tablet Place 1 tablet (0.3 mg total) under the tongue every 5 (five) minutes as needed. 1 prn , can repeat in 5 minutes then ER 07/04/14   Pecola Lawless, MD  Omega-3 Fatty Acids (FISH OIL) 1000 MG CAPS Take 1,000 mg by mouth daily.     Historical Provider, MD  predniSONE (DELTASONE) 10 MG tablet Take 3 tablets (30 mg total) by mouth daily. 07/16/14   Rodolph Bong, MD  Silodosin (RAPAFLO PO) Take by mouth.    Historical Provider, MD  tamsulosin (FLOMAX) 0.4 MG CAPS capsule Take 1 capsule (0.4 mg total) by mouth daily. 07/25/14   April Palumbo, MD   BP 123/64 mmHg  Pulse 70  Temp(Src) 98.5 F (36.9 C) (Oral)  Resp 16  Ht  (1.727 m)  Wt 257 lb 8 oz (116.8 kg)  BMI 39.16 kg/m2  SpO2 95% Physical Exam  Constitutional: He is oriented to person, place, and time. He appears well-developed and well-nourished. No distress.  HENT:  Head: Normocephalic and atraumatic.  Mouth/Throat: Oropharynx is clear and moist. No oropharyngeal exudate.   Eyes: Conjunctivae and EOM are normal. Pupils are equal, round, and reactive to light.  Neck: Normal range of motion. Neck supple.  No meningismus.  Cardiovascular: Normal rate, regular rhythm, normal heart sounds and intact distal pulses.   No murmur heard. Pulmonary/Chest: Effort normal and breath sounds normal. No respiratory distress.  Abdominal: Soft. There is no tenderness. There is no rebound and no guarding.  Genitourinary:  No CVA tenderness Foley catheter in place with dark cloudy urine Testicles nontender  Musculoskeletal: Normal range of motion. He exhibits no edema or tenderness.  Paraspinal lumbar pain  Neurological: He is alert and oriented to person, place, and time. No cranial nerve deficit. He exhibits normal muscle tone. Coordination normal.  No ataxia on finger to nose bilaterally. No pronator drift. 5/5 strength throughout. CN 2-12 intact. Negative Romberg. Equal grip strength. Sensation intact. Gait is normal.   Skin: Skin is warm.  Psychiatric: He has a normal mood and affect. His behavior is normal.  Nursing note and vitals reviewed.   ED Course  Procedures (including critical care time)  DIAGNOSTIC STUDIES: Oxygen Saturation is 100% on room air, normal by my interpretation.    COORDINATION OF CARE: 9:59 PM Discussed treatment plan with patient at beside, including Tylenol, CT scan, blood work, fluids, and consultation with his urologist. He declines pain medication. He states he is not having chills now but does feel warm. The patient agrees with the plan and has no further questions at this time.  Labs Review Labs Reviewed  URINALYSIS, ROUTINE W REFLEX MICROSCOPIC - Abnormal; Notable for the following:    Color, Urine AMBER (*)    APPearance CLOUDY (*)    Hgb urine dipstick LARGE (*)    Protein, ur 100 (*)    Nitrite POSITIVE (*)    Leukocytes, UA MODERATE (*)    All other components within normal limits  URINE MICROSCOPIC-ADD ON - Abnormal; Notable  for the following:    Bacteria, UA MANY (*)    All other components within normal limits  CBC WITH DIFFERENTIAL/PLATELET - Abnormal; Notable for the following:    Neutrophils Relative % 89 (*)    Neutro Abs 9.0 (*)    Lymphocytes Relative 6 (*)    All other components within normal limits  BASIC METABOLIC PANEL - Abnormal; Notable for the following:    Glucose, Bld 125 (*)    GFR calc non Af Amer 70 (*)    GFR calc Af Amer 81 (*)    All other components within normal limits  CBC - Abnormal; Notable for the following:    WBC 11.2 (*)    All other components within normal limits  I-STAT CG4 LACTIC ACID, ED - Abnormal; Notable for the following:    Lactic Acid, Venous 2.62 (*)    All other components within normal limits  MRSA PCR SCREENING  URINE CULTURE  CULTURE, BLOOD (ROUTINE X 2)  CULTURE, BLOOD (ROUTINE X 2)  INFLUENZA PANEL BY PCR (TYPE A & B, H1N1)  BASIC METABOLIC PANEL  I-STAT CG4 LACTIC ACID, ED  I-STAT CG4 LACTIC ACID, ED  I-STAT CG4 LACTIC ACID, ED    Imaging Review Dg Chest Port 1 View  08/14/2014   CLINICAL DATA:  Fever and chills  EXAM: PORTABLE CHEST - 1 VIEW  COMPARISON:  07/16/2014  FINDINGS: A single AP portable view of the chest demonstrates no focal airspace consolidation or alveolar edema. The lungs are grossly clear. There is no large effusion or pneumothorax. Cardiac and mediastinal contours appear unremarkable.  IMPRESSION: No active disease.   Electronically Signed   By: Ellery Plunk M.D.   On: 08/14/2014 00:21   Ct Renal Stone Study  08/13/2014   CLINICAL DATA:  Fever and chills with bilateral flank pain  EXAM: CT ABDOMEN AND PELVIS WITHOUT CONTRAST  TECHNIQUE: Multidetector CT imaging of the abdomen and pelvis was performed following the  standard protocol without IV contrast.  COMPARISON:  None.  FINDINGS: There is no urinary calculus. There is no hydronephrosis or ureteral dilatation  There are unremarkable unenhanced appearances of the liver,  spleen, pancreas, adrenals, kidneys and gallbladder.  Bowel and mesentery appear unremarkable. The appendix is normal. There is mild uncomplicated colonic diverticulosis.  There is no adenopathy. There is no ascites. There are no acute inflammatory changes in the abdomen or pelvis. No significant musculoskeletal abnormalities are evident. There is a tiny fat containing umbilical hernia.  IMPRESSION: No acute findings in the abdomen or pelvis. Mild colonic diverticulosis. Tiny fat containing umbilical hernia.   Electronically Signed   By: Ellery Plunkaniel R Mitchell M.D.   On: 08/13/2014 23:20     EKG Interpretation None      MDM   Final diagnoses:  Urinary tract infection with hematuria, site unspecified  Sepsis due to other etiology   Patient with indwelling Foley catheter that is post urodynamics and cystoscopy yesterday. Complains of chills and fever and aches onset around 6 PM tonight. Urinalysis appears infected and urine appears cloudy.  Tmax 100.6. Patient is nontoxic-appearing. Vitals are stable. Urinalysis appears infected. Culture is sent. Patient started on antibiotics.  Catheter is draining appropriately. No evidence of obstruction. Lactate 2.6 Patient given IV fluids per sepsis protocol.  Discussed with Dr. Laverle PatterBorden. He agrees with above management. He requests urine culture to be sent. He recommends course of levofloxacin.  No cultures available from previous UAs.  Patient feeling improved. Repeat lactate has improved to 1.3.  BP 109/62. HR 92. Tolerating PO fluids. Awaiting completion of IVF.  Care transferred to Dr. Nicanor Alconpalumbo at shift change. Anticipate discharge home if improved and vitals stable.  CRITICAL CARE Performed by: Glynn OctaveANCOUR, Zoei Amison Total critical care time: 35 Critical care time was exclusive of separately billable procedures and treating other patients. Critical care was necessary to treat or prevent imminent or life-threatening deterioration. Critical care was time  spent personally by me on the following activities: development of treatment plan with patient and/or surrogate as well as nursing, discussions with consultants, evaluation of patient's response to treatment, examination of patient, obtaining history from patient or surrogate, ordering and performing treatments and interventions, ordering and review of laboratory studies, ordering and review of radiographic studies, pulse oximetry and re-evaluation of patient's condition.   Glynn OctaveStephen Roberto Hlavaty, MD 08/14/14 1149

## 2014-08-14 ENCOUNTER — Encounter (HOSPITAL_BASED_OUTPATIENT_CLINIC_OR_DEPARTMENT_OTHER): Payer: Self-pay

## 2014-08-14 DIAGNOSIS — J40 Bronchitis, not specified as acute or chronic: Secondary | ICD-10-CM | POA: Diagnosis present

## 2014-08-14 DIAGNOSIS — Z87448 Personal history of other diseases of urinary system: Secondary | ICD-10-CM | POA: Diagnosis not present

## 2014-08-14 DIAGNOSIS — N390 Urinary tract infection, site not specified: Secondary | ICD-10-CM

## 2014-08-14 DIAGNOSIS — Z7982 Long term (current) use of aspirin: Secondary | ICD-10-CM | POA: Diagnosis not present

## 2014-08-14 DIAGNOSIS — R509 Fever, unspecified: Secondary | ICD-10-CM | POA: Diagnosis present

## 2014-08-14 DIAGNOSIS — R319 Hematuria, unspecified: Secondary | ICD-10-CM | POA: Diagnosis present

## 2014-08-14 DIAGNOSIS — A4189 Other specified sepsis: Secondary | ICD-10-CM | POA: Diagnosis not present

## 2014-08-14 DIAGNOSIS — M109 Gout, unspecified: Secondary | ICD-10-CM | POA: Diagnosis present

## 2014-08-14 DIAGNOSIS — Z7952 Long term (current) use of systemic steroids: Secondary | ICD-10-CM | POA: Diagnosis not present

## 2014-08-14 DIAGNOSIS — I10 Essential (primary) hypertension: Secondary | ICD-10-CM | POA: Diagnosis present

## 2014-08-14 DIAGNOSIS — I2510 Atherosclerotic heart disease of native coronary artery without angina pectoris: Secondary | ICD-10-CM | POA: Diagnosis present

## 2014-08-14 DIAGNOSIS — Y846 Urinary catheterization as the cause of abnormal reaction of the patient, or of later complication, without mention of misadventure at the time of the procedure: Secondary | ICD-10-CM | POA: Diagnosis present

## 2014-08-14 DIAGNOSIS — T8351XA Infection and inflammatory reaction due to indwelling urinary catheter, initial encounter: Secondary | ICD-10-CM | POA: Diagnosis not present

## 2014-08-14 DIAGNOSIS — Z882 Allergy status to sulfonamides status: Secondary | ICD-10-CM | POA: Diagnosis not present

## 2014-08-14 DIAGNOSIS — Z87891 Personal history of nicotine dependence: Secondary | ICD-10-CM | POA: Diagnosis not present

## 2014-08-14 DIAGNOSIS — B961 Klebsiella pneumoniae [K. pneumoniae] as the cause of diseases classified elsewhere: Secondary | ICD-10-CM | POA: Diagnosis present

## 2014-08-14 DIAGNOSIS — I959 Hypotension, unspecified: Secondary | ICD-10-CM | POA: Diagnosis present

## 2014-08-14 DIAGNOSIS — E785 Hyperlipidemia, unspecified: Secondary | ICD-10-CM | POA: Diagnosis present

## 2014-08-14 DIAGNOSIS — A419 Sepsis, unspecified organism: Secondary | ICD-10-CM | POA: Diagnosis present

## 2014-08-14 DIAGNOSIS — I9589 Other hypotension: Secondary | ICD-10-CM

## 2014-08-14 DIAGNOSIS — Z955 Presence of coronary angioplasty implant and graft: Secondary | ICD-10-CM | POA: Diagnosis not present

## 2014-08-14 DIAGNOSIS — N401 Enlarged prostate with lower urinary tract symptoms: Secondary | ICD-10-CM | POA: Diagnosis present

## 2014-08-14 LAB — BASIC METABOLIC PANEL
Anion gap: 5 (ref 5–15)
BUN: 9 mg/dL (ref 6–23)
CO2: 23 mmol/L (ref 19–32)
Calcium: 8 mg/dL — ABNORMAL LOW (ref 8.4–10.5)
Chloride: 110 mmol/L (ref 96–112)
Creatinine, Ser: 0.88 mg/dL (ref 0.50–1.35)
GLUCOSE: 123 mg/dL — AB (ref 70–99)
Potassium: 3.8 mmol/L (ref 3.5–5.1)
Sodium: 138 mmol/L (ref 135–145)

## 2014-08-14 LAB — INFLUENZA PANEL BY PCR (TYPE A & B)
H1N1 flu by pcr: NOT DETECTED
Influenza A By PCR: NEGATIVE
Influenza B By PCR: NEGATIVE

## 2014-08-14 LAB — MRSA PCR SCREENING: MRSA BY PCR: NEGATIVE

## 2014-08-14 LAB — CBC
HCT: 39.6 % (ref 39.0–52.0)
Hemoglobin: 13.1 g/dL (ref 13.0–17.0)
MCH: 30.2 pg (ref 26.0–34.0)
MCHC: 33.1 g/dL (ref 30.0–36.0)
MCV: 91.2 fL (ref 78.0–100.0)
PLATELETS: 208 10*3/uL (ref 150–400)
RBC: 4.34 MIL/uL (ref 4.22–5.81)
RDW: 13.2 % (ref 11.5–15.5)
WBC: 11.2 10*3/uL — ABNORMAL HIGH (ref 4.0–10.5)

## 2014-08-14 LAB — I-STAT CG4 LACTIC ACID, ED
LACTIC ACID, VENOUS: 1.74 mmol/L (ref 0.5–2.0)
Lactic Acid, Venous: 1.33 mmol/L (ref 0.5–2.0)

## 2014-08-14 MED ORDER — LEVOFLOXACIN IN D5W 750 MG/150ML IV SOLN
750.0000 mg | Freq: Every day | INTRAVENOUS | Status: DC
  Filled 2014-08-14: qty 150

## 2014-08-14 MED ORDER — ONDANSETRON HCL 4 MG PO TABS: 4.0000 mg | ORAL_TABLET | Freq: Four times a day (QID) | ORAL | Status: DC | PRN

## 2014-08-14 MED ORDER — VANCOMYCIN HCL IN DEXTROSE 1-5 GM/200ML-% IV SOLN
1000.0000 mg | Freq: Once | INTRAVENOUS | Status: AC
  Administered 2014-08-14: 1000 mg via INTRAVENOUS
  Filled 2014-08-14: qty 200

## 2014-08-14 MED ORDER — ROSUVASTATIN CALCIUM 10 MG PO TABS
10.0000 mg | ORAL_TABLET | Freq: Every day | ORAL | Status: DC
  Administered 2014-08-14 – 2014-08-16 (×3): 10 mg via ORAL
  Filled 2014-08-14 (×3): qty 1

## 2014-08-14 MED ORDER — LEVOFLOXACIN 500 MG PO TABS: 500.0000 mg | ORAL_TABLET | Freq: Every day | ORAL | Status: DC

## 2014-08-14 MED ORDER — CEFTRIAXONE SODIUM IN DEXTROSE 20 MG/ML IV SOLN
1.0000 g | INTRAVENOUS | Status: DC
  Administered 2014-08-14 – 2014-08-15 (×2): 1 g via INTRAVENOUS
  Filled 2014-08-14 (×4): qty 50

## 2014-08-14 MED ORDER — ADULT MULTIVITAMIN W/MINERALS CH
1.0000 | ORAL_TABLET | Freq: Every day | ORAL | Status: DC
Start: 2014-08-14 — End: 2014-08-16
  Administered 2014-08-14 – 2014-08-16 (×3): 1 via ORAL
  Filled 2014-08-14 (×3): qty 1

## 2014-08-14 MED ORDER — LEVOFLOXACIN IN D5W 750 MG/150ML IV SOLN
750.0000 mg | Freq: Once | INTRAVENOUS | Status: AC
  Administered 2014-08-14: 750 mg via INTRAVENOUS
  Filled 2014-08-14 (×2): qty 150

## 2014-08-14 MED ORDER — OXYCODONE HCL 5 MG PO TABS: 5.0000 mg | ORAL_TABLET | ORAL | Status: DC | PRN

## 2014-08-14 MED ORDER — ALUM & MAG HYDROXIDE-SIMETH 200-200-20 MG/5ML PO SUSP: 30.0000 mL | Freq: Four times a day (QID) | ORAL | Status: DC | PRN

## 2014-08-14 MED ORDER — ASPIRIN EC 325 MG PO TBEC
325.0000 mg | DELAYED_RELEASE_TABLET | Freq: Every day | ORAL | Status: DC
  Administered 2014-08-14 – 2014-08-16 (×3): 325 mg via ORAL
  Filled 2014-08-14 (×3): qty 1

## 2014-08-14 MED ORDER — ONDANSETRON HCL 4 MG/2ML IJ SOLN: 4.0000 mg | Freq: Four times a day (QID) | INTRAMUSCULAR | Status: DC | PRN

## 2014-08-14 MED ORDER — ALUM & MAG HYDROXIDE-SIMETH 200-200-20 MG/5ML PO SUSP
30.0000 mL | Freq: Once | ORAL | Status: AC
  Administered 2014-08-14: 30 mL via ORAL
  Filled 2014-08-14: qty 30

## 2014-08-14 MED ORDER — ACETAMINOPHEN 650 MG RE SUPP: 650.0000 mg | Freq: Four times a day (QID) | RECTAL | Status: DC | PRN

## 2014-08-14 MED ORDER — HYDROMORPHONE HCL 1 MG/ML IJ SOLN: 0.5000 mg | INTRAMUSCULAR | Status: DC | PRN

## 2014-08-14 MED ORDER — ACETAMINOPHEN 325 MG PO TABS
650.0000 mg | ORAL_TABLET | Freq: Four times a day (QID) | ORAL | Status: DC | PRN
  Administered 2014-08-14 – 2014-08-15 (×2): 650 mg via ORAL
  Filled 2014-08-14 (×3): qty 2

## 2014-08-14 MED ORDER — ALLOPURINOL 300 MG PO TABS
300.0000 mg | ORAL_TABLET | Freq: Every day | ORAL | Status: DC
  Administered 2014-08-14 – 2014-08-16 (×3): 300 mg via ORAL
  Filled 2014-08-14 (×3): qty 1

## 2014-08-14 MED ORDER — IPRATROPIUM-ALBUTEROL 0.5-2.5 (3) MG/3ML IN SOLN: 3.0000 mL | RESPIRATORY_TRACT | Status: DC | PRN

## 2014-08-14 MED ORDER — VANCOMYCIN HCL IN DEXTROSE 1-5 GM/200ML-% IV SOLN
1000.0000 mg | Freq: Three times a day (TID) | INTRAVENOUS | Status: DC
  Administered 2014-08-14: 1000 mg via INTRAVENOUS
  Filled 2014-08-14 (×3): qty 200

## 2014-08-14 MED ORDER — SODIUM CHLORIDE 0.9 % IV BOLUS (SEPSIS)
1000.0000 mL | Freq: Once | INTRAVENOUS | Status: AC
  Administered 2014-08-14: 1000 mL via INTRAVENOUS

## 2014-08-14 MED ORDER — IPRATROPIUM-ALBUTEROL 0.5-2.5 (3) MG/3ML IN SOLN: 3.0000 mL | RESPIRATORY_TRACT | Status: DC

## 2014-08-14 MED ORDER — VANCOMYCIN HCL IN DEXTROSE 1-5 GM/200ML-% IV SOLN
1000.0000 mg | Freq: Two times a day (BID) | INTRAVENOUS | Status: DC
  Filled 2014-08-14: qty 200

## 2014-08-14 MED ORDER — SODIUM CHLORIDE 0.9 % IV SOLN
INTRAVENOUS | Status: AC
  Administered 2014-08-14 (×2): via INTRAVENOUS
  Administered 2014-08-15: 125 mL via INTRAVENOUS

## 2014-08-14 MED ORDER — GUAIFENESIN ER 600 MG PO TB12
1200.0000 mg | ORAL_TABLET | Freq: Two times a day (BID) | ORAL | Status: DC
  Administered 2014-08-14 – 2014-08-16 (×5): 1200 mg via ORAL
  Filled 2014-08-14 (×6): qty 2

## 2014-08-14 MED ORDER — ENOXAPARIN SODIUM 40 MG/0.4ML ~~LOC~~ SOLN
40.0000 mg | Freq: Every day | SUBCUTANEOUS | Status: DC
  Administered 2014-08-14 – 2014-08-15 (×2): 40 mg via SUBCUTANEOUS
  Filled 2014-08-14 (×3): qty 0.4

## 2014-08-14 MED ORDER — FLUOXETINE HCL 20 MG PO CAPS
20.0000 mg | ORAL_CAPSULE | Freq: Every day | ORAL | Status: DC
  Administered 2014-08-14 – 2014-08-16 (×3): 20 mg via ORAL
  Filled 2014-08-14 (×3): qty 1

## 2014-08-14 MED ORDER — SODIUM CHLORIDE 0.9 % IV SOLN
INTRAVENOUS | Status: DC
  Administered 2014-08-14: 125 mL via INTRAVENOUS

## 2014-08-14 MED ORDER — SILODOSIN 4 MG PO CAPS
4.0000 mg | ORAL_CAPSULE | Freq: Every day | ORAL | Status: DC
  Administered 2014-08-14 – 2014-08-16 (×3): 4 mg via ORAL
  Filled 2014-08-14 (×4): qty 1

## 2014-08-14 MED ORDER — OMEGA-3-ACID ETHYL ESTERS 1 G PO CAPS
1.0000 g | ORAL_CAPSULE | Freq: Every day | ORAL | Status: DC
  Administered 2014-08-14 – 2014-08-16 (×3): 1 g via ORAL
  Filled 2014-08-14 (×3): qty 1

## 2014-08-14 NOTE — Progress Notes (Signed)
Paged admitting Md. [facility]  VS stable. Will continue to monitor. [name], [name]

## 2014-08-14 NOTE — Progress Notes (Signed)
[facility] PROGRESS NOTE   Charles BoucheRobert [name] ZOX:[id number] DOB: Feb 23, 1961 DOA: 08/13/2014 PCP: Marga MelnickWilliam Hopper, MD  HPI/Subjective: Feels much better, denies any fever or chills.  Assessment/Plan: Active Problems:   CORONARY ATHEROSCLEROSIS, NATIVE VESSEL   H/O urinary retention   Sepsis   Urinary tract infectious disease   Hypotension   Hyperlipidemia LDL goal <100   Gout   Sepsis Presented with fever, low blood pressure and elevated lactate. Sepsis physiology is likely secondary to UTI. Blood and urine cultures sent. Patient placed on vancomycin and levofloxacin, I will switch to Rocephin.  UTI Foley catheter associated UTI, patient had Foley catheter at home. Urine culture sent, I will switch antibiotics to Rocephin.  Hypotension Secondary to sepsis and UTI, treated aggressively with IV fluids. Blood pressure is normal for now.  Dyslipidemia Continue atorvastatin.  Recently treated bronchitis Flu is negative, still have some cough, I will place him on Mucinex.  Code Status: Full Code Family Communication: Plan discussed with the patient. Disposition Plan: Remains inpatient Diet: Diet Heart  Consultants:  None  Procedures:  None  Antibiotics:  None   Objective: Filed Vitals:   08/14/14 1142  BP: 123/64  Pulse: 70  Temp: 98.5 F (36.9 C)  Resp: 16    Intake/Output Summary (Last 24 hours) at 08/14/14 1303 Last data filed at 08/14/14 1000  Gross per 24 hour  Intake 589.58 ml  Output   2775 ml  Net -2185.42 ml   Filed Weights   08/13/14 2042 08/14/14 0327  Weight: 100.699 kg (222 lb) 116.8 kg (257 lb 8 oz)    Exam: General: Alert and awake, oriented x3, not in any acute distress. HEENT: anicteric sclera, pupils reactive to light and accommodation, EOMI CVS: S1-S2 clear, no murmur rubs or gallops Chest: clear to auscultation bilaterally, no wheezing, rales or rhonchi Abdomen: soft nontender, nondistended, normal bowel sounds, no  organomegaly Extremities: no cyanosis, clubbing or edema noted bilaterally Neuro: Cranial nerves II-XII intact, no focal neurological deficits  Data Reviewed: Basic Metabolic Panel:  Recent Labs Lab 08/08/14 2335 08/13/14 2200 08/14/14 1047  NA 138 136 138  K 3.8 3.9 3.8  CL 105 101 110  CO2 23 24 23   GLUCOSE 118* 125* 123*  BUN 19 23 9   CREATININE 0.86 1.15 0.88  CALCIUM 9.1 8.9 8.0*   Liver Function Tests: No results for input(s): AST, ALT, ALKPHOS, BILITOT, PROT, ALBUMIN in the last 168 hours. No results for input(s): LIPASE, AMYLASE in the last 168 hours. No results for input(s): AMMONIA in the last 168 hours. CBC:  Recent Labs Lab 08/13/14 2200 08/14/14 1047  WBC 10.1 11.2*  NEUTROABS 9.0*  --   HGB 14.7 13.1  HCT 43.8 39.6  MCV 90.1 91.2  PLT 254 208   Cardiac Enzymes: No results for input(s): CKTOTAL, CKMB, CKMBINDEX, TROPONINI in the last 168 hours. BNP (last 3 results) No results for input(s): BNP in the last 8760 hours.  ProBNP (last 3 results) No results for input(s): PROBNP in the last 8760 hours.  CBG: No results for input(s): GLUCAP in the last 168 hours.  Micro Recent Results (from the past 240 hour(s))  MRSA PCR Screening     Status: None   Collection Time: 08/14/14  3:21 AM  Result Value Ref Range Status   MRSA by PCR NEGATIVE NEGATIVE Final    Comment:        The GeneXpert MRSA Assay (FDA approved for NASAL specimens only), is one component of a comprehensive MRSA  colonization surveillance program. It is not intended to diagnose MRSA infection nor to guide or monitor treatment for MRSA infections.      Studies: Dg Chest Port 1 View  08/14/2014   CLINICAL DATA:  Fever and chills  EXAM: PORTABLE CHEST - 1 VIEW  COMPARISON:  07/16/2014  FINDINGS: A single AP portable view of the chest demonstrates no focal airspace consolidation or alveolar edema. The lungs are grossly clear. There is no large effusion or pneumothorax. Cardiac and  mediastinal contours appear unremarkable.  IMPRESSION: No active disease.   Electronically Signed   By: Ellery Plunk M.D.   On: 08/14/2014 00:21   Ct Renal Stone Study  08/13/2014   CLINICAL DATA:  Fever and chills with bilateral flank pain  EXAM: CT ABDOMEN AND PELVIS WITHOUT CONTRAST  TECHNIQUE: Multidetector CT imaging of the abdomen and pelvis was performed following the standard protocol without IV contrast.  COMPARISON:  None.  FINDINGS: There is no urinary calculus. There is no hydronephrosis or ureteral dilatation  There are unremarkable unenhanced appearances of the liver, spleen, pancreas, adrenals, kidneys and gallbladder.  Bowel and mesentery appear unremarkable. The appendix is normal. There is mild uncomplicated colonic diverticulosis.  There is no adenopathy. There is no ascites. There are no acute inflammatory changes in the abdomen or pelvis. No significant musculoskeletal abnormalities are evident. There is a tiny fat containing umbilical hernia.  IMPRESSION: No acute findings in the abdomen or pelvis. Mild colonic diverticulosis. Tiny fat containing umbilical hernia.   Electronically Signed   By: Ellery Plunk M.D.   On: 08/13/2014 23:20    Scheduled Meds: . allopurinol  300 mg Oral Daily  . aspirin  325 mg Oral Daily  . enoxaparin (LOVENOX) injection  40 mg Subcutaneous Daily  . FLUoxetine  20 mg Oral Daily  . levofloxacin (LEVAQUIN) IV  750 mg Intravenous QHS  . multivitamin with minerals  1 tablet Oral Daily  . omega-3 acid ethyl esters  1 g Oral Daily  . rosuvastatin  10 mg Oral Daily  . silodosin  4 mg Oral Q breakfast  . vancomycin  1,000 mg Intravenous Q12H   Continuous Infusions: . sodium chloride 125 mL (08/14/14 0430)       Time spent: 35 minutes    [facility]  [facility] Pager [phone] If 7PM-7AM, please contact night-coverage at [email or web address], password [facility] 08/14/2014, 1:03 PM  LOS: 0 days

## 2014-08-14 NOTE — Discharge Instructions (Signed)
Urinary Tract Infection Take the antibiotics as prescribed. Follow up with Dr. Sherron MondaymacDiarmid as scheduled. Return to the ED if you develop new or worsening symptoms. Urinary tract infections (UTIs) can develop anywhere along your urinary tract. Your urinary tract is your body's drainage system for removing wastes and extra water. Your urinary tract includes two kidneys, two ureters, a bladder, and a urethra. Your kidneys are a pair of bean-shaped organs. Each kidney is about the size of your fist. They are located below your ribs, one on each side of your spine. CAUSES Infections are caused by microbes, which are microscopic organisms, including fungi, viruses, and bacteria. These organisms are so small that they can only be seen through a microscope. Bacteria are the microbes that most commonly cause UTIs. SYMPTOMS  Symptoms of UTIs may vary by age and gender of the patient and by the location of the infection. Symptoms in young women typically include a frequent and intense urge to urinate and a painful, burning feeling in the bladder or urethra during urination. Older women and men are more likely to be tired, shaky, and weak and have muscle aches and abdominal pain. A fever may mean the infection is in your kidneys. Other symptoms of a kidney infection include pain in your back or sides below the ribs, nausea, and vomiting. DIAGNOSIS To diagnose a UTI, your caregiver will ask you about your symptoms. Your caregiver also will ask to provide a urine sample. The urine sample will be tested for bacteria and white blood cells. White blood cells are made by your body to help fight infection. TREATMENT  Typically, UTIs can be treated with medication. Because most UTIs are caused by a bacterial infection, they usually can be treated with the use of antibiotics. The choice of antibiotic and length of treatment depend on your symptoms and the type of bacteria causing your infection. HOME CARE INSTRUCTIONS  If  you were prescribed antibiotics, take them exactly as your caregiver instructs you. Finish the medication even if you feel better after you have only taken some of the medication.  Drink enough water and fluids to keep your urine clear or pale yellow.  Avoid caffeine, tea, and carbonated beverages. They tend to irritate your bladder.  Empty your bladder often. Avoid holding urine for long periods of time.  Empty your bladder before and after sexual intercourse.  After a bowel movement, women should cleanse from front to back. Use each tissue only once. SEEK MEDICAL CARE IF:   You have back pain.  You develop a fever.  Your symptoms do not begin to resolve within 3 days. SEEK IMMEDIATE MEDICAL CARE IF:   You have severe back pain or lower abdominal pain.  You develop chills.  You have nausea or vomiting.  You have continued burning or discomfort with urination. MAKE SURE YOU:   Understand these instructions.  Will watch your condition.  Will get help right away if you are not doing well or get worse. Document Released: 02/20/2005 Document Revised: 11/12/2011 Document Reviewed: 06/21/2011 [facility] Patient Information 2015 [place], [place]. This information is not intended to replace advice given to you by your health care provider. Make sure you discuss any questions you have with your health care provider.

## 2014-08-14 NOTE — Progress Notes (Addendum)
ANTIBIOTIC CONSULT NOTE - INITIAL  Pharmacy Consult for Vancomycin  Indication: rule out sepsis  Allergies  Allergen Reactions  . Lidocaine     REACTION: anaphylactic shock (also he was on Neomycin sulfate)  . Neomycin Sulfate     REACTION: anaphylactic shock (also on Lidocaine  topically)  . Sulfa Drugs Cross Reactors Anaphylaxis  . Flexeril [Cyclobenzaprine]     05/26/13 urinary retention  . Hctz [Hydrochlorothiazide]     Gout   . Tramadol     05/26/13 urinary retention    Patient Measurements: Height:  (172.7 cm) Weight: 257 lb 8 oz (116.8 kg) IBW/kg (Calculated) : 68.4 Adjusted Body Weight: 90 kg  Vital Signs: Temp: 98.1 F (36.7 C) (03/20 0327) Temp Source: Oral (03/20 0327) BP: 98/69 mmHg (03/20 0415) Pulse Rate: 83 (03/20 0327) Intake/Output from previous day: 03/19 0701 - 03/20 0700 In: -  Out: 1400 [Urine:1400] Intake/Output from this shift: Total I/O In: -  Out: 1400 [Urine:1400]  Labs:  Recent Labs  08/13/14 2200  WBC 10.1  HGB 14.7  PLT 254  CREATININE 1.15   Estimated Creatinine Clearance: 90.1 mL/min (by C-G formula based on Cr of 1.15). No results for input(s): VANCOTROUGH, VANCOPEAK, VANCORANDOM, GENTTROUGH, GENTPEAK, GENTRANDOM, TOBRATROUGH, TOBRAPEAK, TOBRARND, AMIKACINPEAK, AMIKACINTROU, AMIKACIN in the last 72 hours.   Microbiology: No results found for this or any previous visit (from the past 720 hour(s)).  Medical History: Past Medical History  Diagnosis Date  . Anaphylaxis     Neomycin sulfate  . Gout   . Hyperlipidemia LDL goal <100   . Hypertension   . Psoriasis   . CAD (coronary artery disease) 04/2008    successful PCI of the lesion in the proximal LAD using xience drug-eluting stent with improvement in central narrowing from 95% to 0%.  Dr. Juanda Chance    Medications:  Prescriptions prior to admission  Medication Sig Dispense Refill Last Dose  . albuterol (PROVENTIL HFA;VENTOLIN HFA) 108 (90 BASE) MCG/ACT inhaler  Inhale 1-2 puffs into the lungs every 6 (six) hours as needed for wheezing or shortness of breath. 1 Inhaler 0   . allopurinol (ZYLOPRIM) 300 MG tablet TAKE 1 TABLET BY MOUTH EVERY DAY 90 tablet 1   . amLODipine (NORVASC) 5 MG tablet Take 1 tablet (5 mg total) by mouth daily. 30 tablet 5   . aspirin 325 MG EC tablet Take 325 mg by mouth daily.   Taking  . clobetasol cream (TEMOVATE) 0.05 % Apply 1 application topically 2 (two) times daily. Apply 1 application topically 2 (two) times daily as needed 300 g 3 Taking  . Coenzyme Q10 (CO Q 10 PO) Take by mouth.    Taking  . CRESTOR 10 MG tablet TAKE 1 TABLET DAILY 90 tablet 1 Taking  . doxycycline (VIBRAMYCIN) 100 MG capsule Take 1 capsule (100 mg total) by mouth 2 (two) times daily. One po bid x 7 days 14 capsule 0   . fluocinonide (LIDEX) 0.05 % external solution Apply 1 application topically 2 (two) times daily. As needed 360 mL 3 Taking  . FLUoxetine (PROZAC) 20 MG capsule TAKE 1 CAPSULE DAILY 30 capsule 1 Taking  . guaiFENesin-codeine 100-10 MG/5ML syrup Take 5 mLs by mouth at bedtime as needed for cough. 120 mL 0   . ibuprofen (ADVIL,MOTRIN) 200 MG tablet Take 200 mg by mouth every 6 (six) hours as needed. 2 tabs=400mg  per dose for muscle aches   Taking  . ipratropium (ATROVENT) 0.06 % nasal spray Place 2  sprays into both nostrils 4 (four) times daily. 15 mL 1   . metoprolol succinate (TOPROL-XL) 100 MG 24 hr tablet Take 1 tablet (100 mg total) by mouth daily. 90 tablet 1   . Multiple Vitamin (MULTIVITAMIN) capsule Take 1 capsule by mouth daily.     Taking  . nitroGLYCERIN (NITROSTAT) 0.3 MG SL tablet Place 1 tablet (0.3 mg total) under the tongue every 5 (five) minutes as needed. 1 prn , can repeat in 5 minutes then ER 90 tablet 0   . Omega-3 Fatty Acids (FISH OIL) 1000 MG CAPS Take 1,000 mg by mouth daily.    Taking  . predniSONE (DELTASONE) 10 MG tablet Take 3 tablets (30 mg total) by mouth daily. 15 tablet 0   . Silodosin (RAPAFLO PO)  Take by mouth.     . tamsulosin (FLOMAX) 0.4 MG CAPS capsule Take 1 capsule (0.4 mg total) by mouth daily. 30 capsule 0    Assessment: 54 y.o. male with indwelling foley, possible urosepsis, for empiric antibiotics.  Vancomycin 1 g IV given in ED at 0140  Goal of Therapy:  Vancomycin trough level 15-20 mcg/ml  Plan:  Vancomycin 1 g IV q8h, next dose at 0600  Abbott, Gary FleetGregory Vernon 08/14/2014,4:28 AM   Addendum: Pt's normalized CrCl is ~9375ml/min.   Plan: Change vancomycin to 1g IV Q12, next dose at 1700 today. Monitor renal function, clinical picture

## 2014-08-14 NOTE — H&P (Signed)
[facility] Admission History and Physical       Thorvald Orsino ZOX:[id number] DOB: [birth year] DOA: 08/13/2014  Referring physician: EDP PCP: Marga Melnick, MD  Specialists:   Chief Complaint:   HPI: Charles [name] is a 54 y.o. male who presented to the [facility] ED with complaints of fevers chills and myalgias, and headache which started at 6 pm  tonight. His temperature measured at 101 on arrival.   He has a foley catheter that was placed due to urinary retention 3 weeks ago after he had completed Rx for a URI/ Bronchitis.   He has been seen by [facility] and had a cystoscopy and voiding study performed.   He was evalauted in the ED and was found to have an elevated lactate level of 2.73, and a sepsis workup was initiated.  He was placed on IV Vancomycin and Levaquin and referred for medical admission to [facility].       Review of Systems:  Constitutional: No Weight Loss, No Weight Gain, Night Sweats, +Fevers, +Chills, +Myalgias, Dizziness, Light Headedness, Fatigue, or Generalized Weakness HEENT:   +Headaches, Difficulty Swallowing,Tooth/Dental Problems,Sore Throat,  No Sneezing, Rhinitis, Ear Ache, Nasal Congestion, or Post Nasal Drip,  Cardio-vascular:  No Chest pain, Orthopnea, PND, Edema in Lower Extremities, Anasarca, Dizziness, Palpitations  Resp: No Dyspnea, No DOE, No Productive Cough, No Non-Productive Cough, No Hemoptysis, No Wheezing.    GI: No Heartburn, Indigestion, Abdominal Pain, Nausea, Vomiting, Diarrhea, Constipation, Hematemesis, Hematochezia, Melena, Change in Bowel Habits,  Loss of Appetite  GU: No Dysuria, No Change in Color of Urine, No Urgency or Urinary Frequency, No Flank pain.  Musculoskeletal: No Joint Pain or Swelling, No Decreased Range of Motion, No Back Pain.  Neurologic: No Syncope, No Seizures, Muscle Weakness, Paresthesia, Vision Disturbance or Loss, No Diplopia, No Vertigo, No Difficulty Walking,  Skin: No Rash or  Lesions. Psych: No Change in Mood or Affect, No Depression or Anxiety, No Memory loss, No Confusion, or Hallucinations   Past Medical History  Diagnosis Date  . Anaphylaxis     Neomycin sulfate  . Gout   . Hyperlipidemia LDL goal <100   . Hypertension   . Psoriasis   . CAD (coronary artery disease) 04/2008    successful PCI of the lesion in the proximal LAD using xience drug-eluting stent with improvement in central narrowing from 95% to 0%.  Dr. Juanda Chance     Past Surgical History  Procedure Laterality Date  . Bunionectomy    . Ganglion cyst excision    . Lipoma resection  2001    L wrist  . Coronary angioplasty with stent placement  2009    PTCA RCA ; Dr Juanda Chance  . Colonoscopy  07/2013    negative      Prior to Admission medications   Medication Sig Start Date End Date Taking? Authorizing Provider  albuterol (PROVENTIL HFA;VENTOLIN HFA) 108 (90 BASE) MCG/ACT inhaler Inhale 1-2 puffs into the lungs every 6 (six) hours as needed for wheezing or shortness of breath. 07/16/14   Rodolph Bong, MD  allopurinol (ZYLOPRIM) 300 MG tablet TAKE 1 TABLET BY MOUTH EVERY DAY 07/04/14   Pecola Lawless, MD  amLODipine (NORVASC) 5 MG tablet Take 1 tablet (5 mg total) by mouth daily. 06/13/14   Pecola Lawless, MD  aspirin 325 MG EC tablet Take 325 mg by mouth daily.    Historical Provider, MD  clobetasol cream (TEMOVATE) 0.05 % Apply 1 application topically 2 (two) times daily.  Apply 1 application topically 2 (two) times daily as needed 11/22/13   Pecola Lawless, MD  Coenzyme Q10 (CO Q 10 PO) Take by mouth.     Historical Provider, MD  CRESTOR 10 MG tablet TAKE 1 TABLET DAILY 03/28/14   Pecola Lawless, MD  doxycycline (VIBRAMYCIN) 100 MG capsule Take 1 capsule (100 mg total) by mouth 2 (two) times daily. One po bid x 7 days 07/25/14   April Palumbo, MD  fluocinonide (LIDEX) 0.05 % external solution Apply 1 application topically 2 (two) times daily. As needed 11/22/13   Pecola Lawless, MD   FLUoxetine (PROZAC) 20 MG capsule TAKE 1 CAPSULE DAILY 03/28/14   Pecola Lawless, MD  guaiFENesin-codeine 100-10 MG/5ML syrup Take 5 mLs by mouth at bedtime as needed for cough. 07/16/14   Rodolph Bong, MD  ibuprofen (ADVIL,MOTRIN) 200 MG tablet Take 200 mg by mouth every 6 (six) hours as needed. 2 tabs=400mg  per dose for muscle aches    Historical Provider, MD  ipratropium (ATROVENT) 0.06 % nasal spray Place 2 sprays into both nostrils 4 (four) times daily. 07/16/14   Rodolph Bong, MD  levofloxacin (LEVAQUIN) 500 MG tablet Take 1 tablet (500 mg total) by mouth daily. 08/14/14   Glynn Octave, MD  metoprolol succinate (TOPROL-XL) 100 MG 24 hr tablet Take 1 tablet (100 mg total) by mouth daily. 07/04/14   Pecola Lawless, MD  Multiple Vitamin (MULTIVITAMIN) capsule Take 1 capsule by mouth daily.      Historical Provider, MD  nitroGLYCERIN (NITROSTAT) 0.3 MG SL tablet Place 1 tablet (0.3 mg total) under the tongue every 5 (five) minutes as needed. 1 prn , can repeat in 5 minutes then ER 07/04/14   Pecola Lawless, MD  Omega-3 Fatty Acids (FISH OIL) 1000 MG CAPS Take 1,000 mg by mouth daily.     Historical Provider, MD  predniSONE (DELTASONE) 10 MG tablet Take 3 tablets (30 mg total) by mouth daily. 07/16/14   Rodolph Bong, MD  Silodosin (RAPAFLO PO) Take by mouth.    Historical Provider, MD  tamsulosin (FLOMAX) 0.4 MG CAPS capsule Take 1 capsule (0.4 mg total) by mouth daily. 07/25/14   Cy Blamer, MD     Allergies  Allergen Reactions  . Lidocaine     REACTION: anaphylactic shock (also he was on Neomycin sulfate)  . Neomycin Sulfate     REACTION: anaphylactic shock (also on Lidocaine  topically)  . Sulfa Drugs Cross Reactors Anaphylaxis  . Flexeril [Cyclobenzaprine]     05/26/13 urinary retention  . Hctz [Hydrochlorothiazide]     Gout   . Tramadol     05/26/13 urinary retention    Social History:  reports that he quit smoking about 17 years ago. He has quit using smokeless tobacco. He  reports that he drinks about 0.6 oz of alcohol per week. He reports that he does not use illicit drugs.    Family History  Problem Relation Age of Onset  . Hypertension Father   . Stroke Father 68  . Lung cancer Mother      smoker  . Diabetes Sister   . Heart disease Brother 49     stent LAD 2012  . Colon cancer Neg Hx   . Pancreatic cancer Neg Hx   . Rectal cancer Neg Hx   . Stomach cancer Neg Hx        Physical Exam:  GEN:  Pleasant Well Nourished and Well Developed  53  y.o. Caucasian male examined and in no acute distress; cooperative with exam Filed Vitals:   08/14/14 0330 08/14/14 0345 08/14/14 0400 08/14/14 0415  BP: 105/62 104/58 105/57 98/69  Pulse:      Temp:      TempSrc:      Resp: Height:      Weight:      SpO2: 96% 96% 97% 98%   Blood pressure 98/69, pulse 83, temperature 98.1 F (36.7 C), temperature source Oral, resp. rate 11, height  (1.727 m), weight 116.8 kg (257 lb 8 oz), SpO2 98 %. PSYCH: He is alert and oriented x4; does not appear anxious does not appear depressed; affect is normal HEENT: Normocephalic and Atraumatic, Mucous membranes pink; PERRLA; EOM intact; Fundi:  Benign;  No scleral icterus, Nares: Patent, Oropharynx: Clear, Fair Dentition,    Neck:  FROM, No Cervical Lymphadenopathy nor Thyromegaly or Carotid Bruit; No JVD; Breasts:: Not examined CHEST WALL: No tenderness CHEST: Normal respiration, clear to auscultation bilaterally HEART: Regular rate and rhythm; no murmurs rubs or gallops BACK: No kyphosis or scoliosis; No CVA tenderness ABDOMEN: Positive Bowel Sounds,  Soft Non-Tender, No Rebound or Guarding; No Masses, No Organomegaly. Rectal Exam: Not done EXTREMITIES: No  Cyanosis, Clubbing, or Edema; No Ulcerations. Genitalia: not examined PULSES: 2+ and symmetric SKIN: Normal hydration no rash or ulceration CNS:  Alert and Oriented x 4, No Focal Deficits Vascular: pulses palpable throughout    Labs on Admission:   Basic Metabolic Panel:  Recent Labs Lab 08/08/14 2335 08/13/14 2200  NA 138 136  K 3.8 3.9  CL 105 101  CO2 23 24  GLUCOSE 118* 125*  BUN 19 23  CREATININE 0.86 1.15  CALCIUM 9.1 8.9   Liver Function Tests: No results for input(s): AST, ALT, ALKPHOS, BILITOT, PROT, ALBUMIN in the last 168 hours. No results for input(s): LIPASE, AMYLASE in the last 168 hours. No results for input(s): AMMONIA in the last 168 hours. CBC:  Recent Labs Lab 08/13/14 2200  WBC 10.1  NEUTROABS 9.0*  HGB 14.7  HCT 43.8  MCV 90.1  PLT 254   Cardiac Enzymes: No results for input(s): CKTOTAL, CKMB, CKMBINDEX, TROPONINI in the last 168 hours.  BNP (last 3 results) No results for input(s): BNP in the last 8760 hours.  ProBNP (last 3 results) No results for input(s): PROBNP in the last 8760 hours.  CBG: No results for input(s): GLUCAP in the last 168 hours.  Radiological Exams on Admission: Dg Chest Port 1 View  08/14/2014   CLINICAL DATA:  Fever and chills  EXAM: PORTABLE CHEST - 1 VIEW  COMPARISON:  07/16/2014  FINDINGS: A single AP portable view of the chest demonstrates no focal airspace consolidation or alveolar edema. The lungs are grossly clear. There is no large effusion or pneumothorax. Cardiac and mediastinal contours appear unremarkable.  IMPRESSION: No active disease.   Electronically Signed   By: Ellery Plunk M.D.   On: 08/14/2014 00:21   Ct Renal Stone Study  08/13/2014   CLINICAL DATA:  Fever and chills with bilateral flank pain  EXAM: CT ABDOMEN AND PELVIS WITHOUT CONTRAST  TECHNIQUE: Multidetector CT imaging of the abdomen and pelvis was performed following the standard protocol without IV contrast.  COMPARISON:  None.  FINDINGS: There is no urinary calculus. There is no hydronephrosis or ureteral dilatation  There are unremarkable unenhanced appearances of the liver, spleen, pancreas, adrenals, kidneys and gallbladder.  Bowel and mesentery appear unremarkable.  The appendix is  normal. There is mild uncomplicated colonic diverticulosis.  There is no adenopathy. There is no ascites. There are no acute inflammatory changes in the abdomen or pelvis. No significant musculoskeletal abnormalities are evident. There is a tiny fat containing umbilical hernia.  IMPRESSION: No acute findings in the abdomen or pelvis. Mild colonic diverticulosis. Tiny fat containing umbilical hernia.   Electronically Signed   By: Ellery Plunkaniel R Mitchell M.D.   On: 08/13/2014 23:20     EKG: Independently reviewed.         Assessment/Plan:   54 y.o. male with  Active Problems:   1.   Sepsis   Blood and Urine Cultures sent   Placed on IV Vancomycin and Levaquin    2.   Urinary tract infectious disease   Urine Culture sent   IV Vancomycin and Levaquin     3.  Hypotension- due to #1   IVFs     4.  H/O urinary retention   Foley in Place     5.  CORONARY ATHEROSCLEROSIS, NATIVE VESSEL   stable     6.  Hyperlipidemia LDL goal <100   Continue Atorvastatin Rx     7.  Gout   stable     8.  DVT Prophylaxis   Lovenox     Code Status:     FULL CODE        Family Communication:   No Family Present    Disposition Plan:    Inpatient  Status        Time spent:  1160 Minutes      Ron ParkerJENKINS,Orey Moure C [facility] Pager [phone]   If 7AM -7PM Please Contact the Day Rounding Team MD for [facility]  If 7PM-7AM, Please Contact Night-Floor Coverage  [email or web address] Password [email or web address] 08/14/2014, 4:29 AM     ADDENDUM:   Patient was seen and examined on 08/14/2014

## 2014-08-15 DIAGNOSIS — E785 Hyperlipidemia, unspecified: Secondary | ICD-10-CM

## 2014-08-15 DIAGNOSIS — A419 Sepsis, unspecified organism: Principal | ICD-10-CM

## 2014-08-15 LAB — BASIC METABOLIC PANEL
Anion gap: 6 (ref 5–15)
BUN: 8 mg/dL (ref 6–23)
CHLORIDE: 108 mmol/L (ref 96–112)
CO2: 26 mmol/L (ref 19–32)
Calcium: 8.3 mg/dL — ABNORMAL LOW (ref 8.4–10.5)
Creatinine, Ser: 0.87 mg/dL (ref 0.50–1.35)
GFR calc Af Amer: >90 mL/min (ref >90)
GFR calc non Af Amer: >90 mL/min (ref >90)
Glucose, Bld: 91 mg/dL (ref 70–99)
POTASSIUM: 3.8 mmol/L (ref 3.5–5.1)
Sodium: 140 mmol/L (ref 135–145)

## 2014-08-15 LAB — CBC
HEMATOCRIT: 39.2 % (ref 39.0–52.0)
Hemoglobin: 12.9 g/dL — ABNORMAL LOW (ref 13.0–17.0)
MCH: 29.9 pg (ref 26.0–34.0)
MCHC: 32.9 g/dL (ref 30.0–36.0)
MCV: 91 fL (ref 78.0–100.0)
Platelets: 206 10*3/uL (ref 150–400)
RBC: 4.31 MIL/uL (ref 4.22–5.81)
RDW: 13.1 % (ref 11.5–15.5)
WBC: 9.3 10*3/uL (ref 4.0–10.5)

## 2014-08-15 MED ORDER — POLYETHYLENE GLYCOL 3350 17 G PO PACK
17.0000 g | PACK | Freq: Every day | ORAL | Status: DC
  Administered 2014-08-15 – 2014-08-16 (×2): 17 g via ORAL
  Filled 2014-08-15 (×2): qty 1

## 2014-08-15 NOTE — Clinical Documentation Improvement (Signed)
A cause and effect relationship may not be assumed and must be documented by a provider. Please clarify the relationship, if any, between UTI and foley catheter.  Are the conditions: ? Due to or associated with each other ? Other (please specify) ___________________ ? Unrelated to each other ? Unable to determine ? Unknown    Risk Factors: He was seen here for urinary retention 5 days ago, then saw his urologist yesterday. Foley was replaced and had testing that revealed decreased flow as well as restriction in his prostate; he also had cystoscopy.   Sign & Symptoms:  Per ED Provider note:  He came in today because he starting to have chills, fever, body aches, pale hands, and headache at 1800 tonight. Temperature measured at 101 tympanic and 99 oral. Urine has been dark since yesterday and notes some blood yesterday. Final diagnoses:  Urinary tract infection with hematuria, site unspecified  Sepsis due to other etiology   Patient with indwelling Foley catheter that is post urodynamics and cystoscopy yesterday. Complains of chills and fever and aches onset around 6 PM tonight. Urinalysis appears infected and urine appears cloudy.  Tmax 100.6. Patient is nontoxic-appearing. Vitals are stable. Urinalysis appears infected. Culture is sent. Patient started on antibiotics.  Catheter is draining appropriately. No evidence of obstruction. Lactate 2.6 Patient given IV fluids per sepsis protocol      Diagnostics: Component     Latest Ref Rng 08/13/2014  Color, Urine     YELLOW AMBER (A)  APPearance     CLEAR CLOUDY (A)  Specific Gravity, Urine     1.005 - 1.030 1.018  pH     5.0 - 8.0 5.5  Glucose     NEGATIVE mg/dL NEGATIVE  Hgb urine dipstick     NEGATIVE LARGE (A)  Bilirubin Urine     NEGATIVE NEGATIVE  Ketones, ur     NEGATIVE mg/dL NEGATIVE  Protein     NEGATIVE mg/dL 161100 (A)  Urobilinogen, UA     0.0 - 1.0 mg/dL 1.0  Nitrite     NEGATIVE POSITIVE (A)   Leukocytes, UA     NEGATIVE MODERATE (A)   Component     Latest Ref Rng 08/13/2014  Squamous Epithelial / LPF     RARE RARE  WBC, UA     <3 WBC/hpf 21-50  RBC / HPF     <3 RBC/hpf TOO NUMEROUS TO COUNT  Bacteria, UA     RARE MANY (A)   Thank you Milton Sagona T. Luiz OchoaWilliams RN, MSN, MBA/MHA Clinical Documentation Specialist [email or web address] Office # [phone]

## 2014-08-15 NOTE — Care Management Note (Signed)
    Page 1 of 1   08/15/2014     10:35:01 AM CARE MANAGEMENT NOTE 08/15/2014  Patient:  Charles [name],Charles [name]   Account Number:  [id number]  Date Initiated:  08/15/2014  Documentation initiated by:  Junius CreamerWELL,Charles  Subjective/Objective Assessment:   adm w uti     Action/Plan:   lives w wife, pcp dr Chrissie Noawilliam hopper   Anticipated DC Date:     Anticipated DC Plan:  HOME/SELF CARE         Choice offered to / List presented to:             Status of service:   Medicare Important Message given?   (If response is "NO", the following Medicare IM given date fields will be blank) Date Medicare IM given:   Medicare IM given by:   Date Additional Medicare IM given:   Additional Medicare IM given by:    Discharge Disposition:    Per UR Regulation:  Reviewed for med. necessity/level of care/duration of stay  If discussed at Long Length of Stay Meetings, dates discussed:    Comments:

## 2014-08-15 NOTE — Progress Notes (Signed)
[facility] PROGRESS NOTE   Charles [name] ZOX:[id number] DOB: [birth year] DOA: 08/13/2014 PCP: Marga Melnick, MD  HPI/Subjective: Denies any fever or chills, felt warm earlier today.  Assessment/Plan: Active Problems:   CORONARY ATHEROSCLEROSIS, NATIVE VESSEL   H/O urinary retention   Sepsis   Urinary tract infectious disease   Hypotension   Hyperlipidemia LDL goal <100   Gout   Sepsis Presented with fever of 100.6, RR is 24 and elevated lactate. Sepsis physiology is likely secondary to UTI. Blood and urine cultures sent. Patient placed on vancomycin and levofloxacin initially, switched to Rocephin.  UTI Foley catheter associated UTI, patient had Foley catheter at home. Urine culture sent, switched to Rocephin. No fever chills today, will transfer to regular bed.  Hypotension Secondary to sepsis and UTI, treated aggressively with IV fluids.  No low blood pressure since yesterday.  Dyslipidemia Continue atorvastatin.  Recently treated bronchitis Flu is negative, still have some cough, placed on Mucinex.  Code Status: Full Code Family Communication: Plan discussed with the patient. Disposition Plan: Remains inpatient Diet: Diet Heart  Consultants:  None  Procedures:  None  Antibiotics:  None   Objective: Filed Vitals:   08/15/14 1200  BP: 139/91  Pulse: 78  Temp: 98.3 F (36.8 C)  Resp: 18    Intake/Output Summary (Last 24 hours) at 08/15/14 1330 Last data filed at 08/15/14 1000  Gross per 24 hour  Intake   4145 ml  Output   4970 ml  Net   -825 ml   Filed Weights   08/13/14 2042 08/14/14 0327  Weight: 100.699 kg (222 lb) 116.8 kg (257 lb 8 oz)    Exam: General: Alert and awake, oriented x3, not in any acute distress. HEENT: anicteric sclera, pupils reactive to light and accommodation, EOMI CVS: S1-S2 clear, no murmur rubs or gallops Chest: clear to auscultation bilaterally, no wheezing, rales or rhonchi Abdomen: soft  nontender, nondistended, normal bowel sounds, no organomegaly Extremities: no cyanosis, clubbing or edema noted bilaterally Neuro: Cranial nerves II-XII intact, no focal neurological deficits  Data Reviewed: Basic Metabolic Panel:  Recent Labs Lab 08/08/14 2335 08/13/14 2200 08/14/14 1047 08/15/14 0258  NA 138 136 138 140  K 3.8 3.9 3.8 3.8  CL 105 101 110 108  CO2 GLUCOSE 118* 125* 123* 91  BUN CREATININE 0.86 1.15 0.88 0.87  CALCIUM 9.1 8.9 8.0* 8.3*   Liver Function Tests: No results for input(s): AST, ALT, ALKPHOS, BILITOT, PROT, ALBUMIN in the last 168 hours. No results for input(s): LIPASE, AMYLASE in the last 168 hours. No results for input(s): AMMONIA in the last 168 hours. CBC:  Recent Labs Lab 08/13/14 2200 08/14/14 1047 08/15/14 0258  WBC 10.1 11.2* 9.3  NEUTROABS 9.0*  --   --   HGB 14.7 13.1 12.9*  HCT 43.8 39.6 39.2  MCV 90.1 91.2 91.0  PLT 254 208 206   Cardiac Enzymes: No results for input(s): CKTOTAL, CKMB, CKMBINDEX, TROPONINI in the last 168 hours. BNP (last 3 results) No results for input(s): BNP in the last 8760 hours.  ProBNP (last 3 results) No results for input(s): PROBNP in the last 8760 hours.  CBG: No results for input(s): GLUCAP in the last 168 hours.  Micro Recent Results (from the past 240 hour(s))  Urine culture     Status: None (Preliminary result)   Collection Time: 08/13/14  8:49 PM  Result Value Ref Range Status   Specimen Description URINE,  RANDOM PEDIATRIC BAG  Final   Special Requests NONE  Final   Colony Count   Final    >=100,000 COLONIES/ML Performed at [facility]    Culture   Final    GRAM NEGATIVE RODS Performed at [facility]    Report Status PENDING  Incomplete  Blood culture (routine x 2)     Status: None (Preliminary result)   Collection Time: 08/13/14 10:20 PM  Result Value Ref Range Status   Specimen Description BLOOD RIGHT ANTECUBITAL  Final   Special  Requests   Final    BOTTLES DRAWN AEROBIC AND ANAEROBIC AER 5CC ANA 5 CC   Culture   Final           BLOOD CULTURE RECEIVED NO GROWTH TO DATE CULTURE WILL BE HELD FOR 5 DAYS BEFORE ISSUING A FINAL NEGATIVE REPORT Performed at [facility]    Report Status PENDING  Incomplete  Blood culture (routine x 2)     Status: None (Preliminary result)   Collection Time: 08/13/14 11:00 PM  Result Value Ref Range Status   Specimen Description BLOOD RIGHT HAND  Final   Special Requests   Final    BOTTLES DRAWN AEROBIC AND ANAEROBIC AER 10CC ANA 10CC   Culture   Final           BLOOD CULTURE RECEIVED NO GROWTH TO DATE CULTURE WILL BE HELD FOR 5 DAYS BEFORE ISSUING A FINAL NEGATIVE REPORT Performed at [facility]    Report Status PENDING  Incomplete  MRSA PCR Screening     Status: None   Collection Time: 08/14/14  3:21 AM  Result Value Ref Range Status   MRSA by PCR NEGATIVE NEGATIVE Final    Comment:        The GeneXpert MRSA Assay (FDA approved for NASAL specimens only), is one component of a comprehensive MRSA colonization surveillance program. It is not intended to diagnose MRSA infection nor to guide or monitor treatment for MRSA infections.      Studies: Dg Chest Port 1 View  08/14/2014   CLINICAL DATA:  Fever and chills  EXAM: PORTABLE CHEST - 1 VIEW  COMPARISON:  07/16/2014  FINDINGS: A single AP portable view of the chest demonstrates no focal airspace consolidation or alveolar edema. The lungs are grossly clear. There is no large effusion or pneumothorax. Cardiac and mediastinal contours appear unremarkable.  IMPRESSION: No active disease.   Electronically Signed   By: Ellery Plunk M.D.   On: 08/14/2014 00:21   Ct Renal Stone Study  08/13/2014   CLINICAL DATA:  Fever and chills with bilateral flank pain  EXAM: CT ABDOMEN AND PELVIS WITHOUT CONTRAST  TECHNIQUE: Multidetector CT imaging of the abdomen and pelvis was performed following the standard protocol  without IV contrast.  COMPARISON:  None.  FINDINGS: There is no urinary calculus. There is no hydronephrosis or ureteral dilatation  There are unremarkable unenhanced appearances of the liver, spleen, pancreas, adrenals, kidneys and gallbladder.  Bowel and mesentery appear unremarkable. The appendix is normal. There is mild uncomplicated colonic diverticulosis.  There is no adenopathy. There is no ascites. There are no acute inflammatory changes in the abdomen or pelvis. No significant musculoskeletal abnormalities are evident. There is a tiny fat containing umbilical hernia.  IMPRESSION: No acute findings in the abdomen or pelvis. Mild colonic diverticulosis. Tiny fat containing umbilical hernia.   Electronically Signed   By: Ellery Plunk M.D.   On: 08/13/2014 23:20  Scheduled Meds: . allopurinol  300 mg Oral Daily  . aspirin  325 mg Oral Daily  . cefTRIAXone (ROCEPHIN)  IV  1 g Intravenous Q24H  . enoxaparin (LOVENOX) injection  40 mg Subcutaneous Daily  . FLUoxetine  20 mg Oral Daily  . guaiFENesin  1,200 mg Oral BID  . multivitamin with minerals  1 tablet Oral Daily  . omega-3 acid ethyl esters  1 g Oral Daily  . rosuvastatin  10 mg Oral Daily  . silodosin  4 mg Oral Q breakfast   Continuous Infusions: . sodium chloride 125 mL (08/15/14 0653)       Time spent: 35 minutes    [facility] CampusELMAHI,Sanchez Hemmer A  [facility] Pager [phone] If 7PM-7AM, please contact night-coverage at [email or web address], password [email or web address] 08/15/2014, 1:30 PM  LOS: 1 day

## 2014-08-16 DIAGNOSIS — Z87448 Personal history of other diseases of urinary system: Secondary | ICD-10-CM

## 2014-08-16 LAB — URINE CULTURE: Colony Count: >=100000

## 2014-08-16 LAB — BASIC METABOLIC PANEL
Anion gap: 9 (ref 5–15)
BUN: 9 mg/dL (ref 6–23)
CO2: 27 mmol/L (ref 19–32)
Calcium: 8.9 mg/dL (ref 8.4–10.5)
Chloride: 103 mmol/L (ref 96–112)
Creatinine, Ser: 0.84 mg/dL (ref 0.50–1.35)
GFR calc non Af Amer: >90 mL/min (ref >90)
Glucose, Bld: 75 mg/dL (ref 70–99)
POTASSIUM: 3.7 mmol/L (ref 3.5–5.1)
SODIUM: 139 mmol/L (ref 135–145)

## 2014-08-16 LAB — CBC
HCT: 41.1 % (ref 39.0–52.0)
Hemoglobin: 13.9 g/dL (ref 13.0–17.0)
MCH: 30.3 pg (ref 26.0–34.0)
MCHC: 33.8 g/dL (ref 30.0–36.0)
MCV: 89.5 fL (ref 78.0–100.0)
Platelets: 234 10*3/uL (ref 150–400)
RBC: 4.59 MIL/uL (ref 4.22–5.81)
RDW: 13.1 % (ref 11.5–15.5)
WBC: 7.2 10*3/uL (ref 4.0–10.5)

## 2014-08-16 MED ORDER — CEFUROXIME AXETIL 500 MG PO TABS: 500.0000 mg | ORAL_TABLET | Freq: Two times a day (BID) | ORAL | Status: DC

## 2014-08-16 NOTE — Care Management Note (Signed)
CARE MANAGEMENT NOTE 08/16/2014  Patient:  Charles [name],Charles [name]   Account Number:  [id number]  Date Initiated:  08/15/2014  Documentation initiated by:  Junius CreamerWELL,DEBBIE  Subjective/Objective Assessment:   adm w uti     Action/Plan:   lives w wife, pcp dr Chrissie Noawilliam hopper   Anticipated DC Date:     Anticipated DC Plan:  HOME/SELF CARE         Choice offered to / List presented to:             Status of service:   Medicare Important Message given?  NO (If response is "NO", the following Medicare IM given date fields will be blank) Date Medicare IM given:   Medicare IM given by:   Date Additional Medicare IM given:   Additional Medicare IM given by:    Discharge Disposition:  HOME/SELF CARE  Per UR Regulation:  Reviewed for med. necessity/level of care/duration of stay  If discussed at Long Length of Stay Meetings, dates discussed:    Comments:

## 2014-08-16 NOTE — Discharge Summary (Addendum)
Physician Discharge Summary  Charles [name] ZOX:[id number] DOB: 28-Apr-1961 DOA: 08/13/2014  PCP: Marga Melnick, MD  Admit date: 08/13/2014 Discharge date: 08/16/2014  Time spent: 40 minutes  Recommendations for Outpatient Follow-up:  1. Follow-up with Dr. Annabell Howells on 08/18/2014  Discharge Diagnoses:  Active Problems:   CORONARY ATHEROSCLEROSIS, NATIVE VESSEL   H/O urinary retention   Sepsis   Urinary tract infectious disease   Hypotension   Hyperlipidemia LDL goal <100   Gout   Discharge Condition: Stable  Diet recommendation:  Heart healthy  Filed Weights   08/13/14 2042 08/14/14 0327 08/15/14 1844  Weight: 100.699 kg (222 lb) 116.8 kg (257 lb 8 oz) 103.6 kg (228 lb 6.3 oz)    History of present illness:  Charles [name] is a 54 y.o. male who presented to the [facility] ED with complaints of fevers chills and myalgias, and headache which started at 6 pm tonight. His temperature measured at 101 on arrival. He has a foley catheter that was placed due to urinary retention 3 weeks ago after he had completed Rx for a URI/ Bronchitis. He has been seen by [facility] and had a cystoscopy and voiding study performed. He was evalauted in the ED and was found to have an elevated lactate level of 2.73, and a sepsis workup was initiated. He was placed on IV Vancomycin and Levaquin and referred for medical admission to [facility] Course:   Sepsis Presented with fever of 100.6, RR is 24 and elevated lactate. Sepsis physiology is likely secondary to UTI. Blood and urine cultures sent. Patient placed on vancomycin and levofloxacin initially, switched to Rocephin. Discharge on 7 more days of Ceftin.  KLEBSIELLA OZAENAE UTI Foley catheter associated UTI, patient has Foley catheter for the past at least 3 weeks at home Urine culture sent, initially was on vancomycin and levofloxacin, then switched to Rocephin. Culture showed pansensitive Klebsiella ozaenae,  discharge on Ceftin for 7 more days.  BPH Patient on Rapaflo, had problems with urinary retention last year and complicated UTI as well. Patient to see Dr. Annabell Howells of [facility] for consideration of prostate resection.  Hypotension Secondary to sepsis and UTI, treated aggressively with IV fluids.  No low blood pressure since yesterday.  Dyslipidemia Continue atorvastatin.  Recently treated bronchitis Flu is negative, still have some cough, asked to take OTC Mucinex.   Procedures:  None  Consultations:   None  Discharge Exam: Filed Vitals:   08/16/14 1225  BP: 148/89  Pulse: 95  Temp: 98.7 F (37.1 C)  Resp: 18   General: Alert and awake, oriented x3, not in any acute distress. HEENT: anicteric sclera, pupils reactive to light and accommodation, EOMI CVS: S1-S2 clear, no murmur rubs or gallops Chest: clear to auscultation bilaterally, no wheezing, rales or rhonchi Abdomen: soft nontender, nondistended, normal bowel sounds, no organomegaly Extremities: no cyanosis, clubbing or edema noted bilaterally Neuro: Cranial nerves II-XII intact, no focal neurological deficits   Discharge Instructions   Discharge Instructions    Diet - low sodium heart healthy    Complete by:  As directed      Increase activity slowly    Complete by:  As directed           Current Discharge Medication List    START taking these medications   Details  cefUROXime (CEFTIN) 500 MG tablet Take 1 tablet (500 mg total) by mouth 2 (two) times daily with a meal. Qty: 14 tablet, Refills: 0  CONTINUE these medications which have NOT CHANGED   Details  allopurinol (ZYLOPRIM) 300 MG tablet TAKE 1 TABLET BY MOUTH EVERY DAY Qty: 90 tablet, Refills: 1    aspirin 325 MG EC tablet Take 325 mg by mouth daily.    Aspirin-Acetaminophen-Caffeine (EXCEDRIN PO) Take 2 tablets by mouth daily as needed (headache).    Carboxymethylcellul-Glycerin 0.5-0.9 % SOLN Place 2 drops into both eyes 2  (two) times daily.    clobetasol cream (TEMOVATE) 0.05 % Apply 1 application topically 2 (two) times daily. Apply 1 application topically 2 (two) times daily as needed Qty: 300 g, Refills: 3    Coenzyme Q10 (CO Q 10 PO) Take by mouth.     CRESTOR 10 MG tablet TAKE 1 TABLET DAILY Qty: 90 tablet, Refills: 1    DiphenhydrAMINE HCl (ALER-TAB PO) Take 1 tablet by mouth at bedtime.    fluocinonide (LIDEX) 0.05 % external solution Apply 1 application topically 2 (two) times daily. As needed Qty: 360 mL, Refills: 3    FLUoxetine (PROZAC) 20 MG capsule TAKE 1 CAPSULE DAILY Qty: 30 capsule, Refills: 1    metoprolol succinate (TOPROL-XL) 100 MG 24 hr tablet Take 1 tablet (100 mg total) by mouth daily. Qty: 90 tablet, Refills: 1    Multiple Vitamin (MULTIVITAMIN) capsule Take 1 capsule by mouth daily.      Omega-3 Fatty Acids (FISH OIL) 1000 MG CAPS Take 1,000 mg by mouth daily.     Silodosin (RAPAFLO PO) Take by mouth daily.     amLODipine (NORVASC) 5 MG tablet Take 1 tablet (5 mg total) by mouth daily. Qty: 30 tablet, Refills: 5   Associated Diagnoses: Essential hypertension    ibuprofen (ADVIL,MOTRIN) 200 MG tablet Take 200 mg by mouth every 6 (six) hours as needed. 2 tabs=400mg  per dose for muscle aches    nitroGLYCERIN (NITROSTAT) 0.3 MG SL tablet Place 1 tablet (0.3 mg total) under the tongue every 5 (five) minutes as needed. 1 prn , can repeat in 5 minutes then ER Qty: 90 tablet, Refills: 0       Allergies  Allergen Reactions  . Lidocaine Anaphylaxis    REACTION: anaphylactic shock (also he was on Neomycin sulfate)  . Neomycin Sulfate Anaphylaxis    REACTION: anaphylactic shock (also on Lidocaine  topically)  . Sulfa Drugs Cross Reactors Anaphylaxis  . Flexeril [Cyclobenzaprine]     05/26/13 urinary retention  . Hctz [Hydrochlorothiazide]     Gout   . Tramadol     05/26/13 urinary retention  . Atrovent [Ipratropium] Other (See Comments)    Urinary retention  .  Latex Itching  . Other     Nasal spray for bronchitis caused urinary retention.   Follow-up Information    Follow up with Marga Melnick, MD. Schedule an appointment as soon as possible for a visit in 2 days.   Specialty:  Internal Medicine   Contact information:   [address] [place] [address] [phone]       Follow up with MACDIARMID,SCOTT A, MD. Schedule an appointment as soon as possible for a visit in 2 days.   Specialty:  Urology   Contact information:   [address] [place] [address] [phone]        The results of significant diagnostics from this hospitalization (including imaging, microbiology, ancillary and laboratory) are listed below for reference.    Significant Diagnostic Studies: Dg Chest Port 1 View  08/14/2014   CLINICAL DATA:  Fever and chills  EXAM: PORTABLE CHEST - 1 VIEW  COMPARISON:  07/16/2014  FINDINGS: A single AP portable view of the chest demonstrates no focal airspace consolidation or alveolar edema. The lungs are grossly clear. There is no large effusion or pneumothorax. Cardiac and mediastinal contours appear unremarkable.  IMPRESSION: No active disease.   Electronically Signed   By: Ellery Plunkaniel R Mitchell M.D.   On: 08/14/2014 00:21   Ct Renal Stone Study  08/13/2014   CLINICAL DATA:  Fever and chills with bilateral flank pain  EXAM: CT ABDOMEN AND PELVIS WITHOUT CONTRAST  TECHNIQUE: Multidetector CT imaging of the abdomen and pelvis was performed following the standard protocol without IV contrast.  COMPARISON:  None.  FINDINGS: There is no urinary calculus. There is no hydronephrosis or ureteral dilatation  There are unremarkable unenhanced appearances of the liver, spleen, pancreas, adrenals, kidneys and gallbladder.  Bowel and mesentery appear unremarkable. The appendix is normal. There is mild uncomplicated colonic diverticulosis.  There is no adenopathy. There is no ascites. There are no acute inflammatory changes in the abdomen or  pelvis. No significant musculoskeletal abnormalities are evident. There is a tiny fat containing umbilical hernia.  IMPRESSION: No acute findings in the abdomen or pelvis. Mild colonic diverticulosis. Tiny fat containing umbilical hernia.   Electronically Signed   By: Ellery Plunkaniel R Mitchell M.D.   On: 08/13/2014 23:20    Microbiology: Recent Results (from the past 240 hour(s))  Urine culture     Status: None   Collection Time: 08/13/14  8:49 PM  Result Value Ref Range Status   Specimen Description URINE, RANDOM PEDIATRIC BAG  Final   Special Requests NONE  Final   Colony Count   Final    >=100,000 COLONIES/ML [facility]    Culture   Final    KLEBSIELLA OZAENAE [facility]formed at [facility]    Report Status 08/16/2014 FINAL  Final   Organism ID, Bacteria KLEBSIELLA OZAENAE  Final      Susceptibility   Klebsiella ozaenae - MIC*    AMPICILLIN RESISTANT      CEFAZOLIN <=4 SENSITIVE Sensitive     CEFTRIAXONE <=1 SENSITIVE Sensitive     CIPROFLOXACIN <=0.25 SENSITIVE Sensitive     GENTAMICIN <=1 SENSITIVE Sensitive     LEVOFLOXACIN <=0.12 SENSITIVE Sensitive     NITROFURANTOIN 32 SENSITIVE Sensitive     TOBRAMYCIN <=1 SENSITIVE Sensitive     TRIMETH/SULFA <=20 SENSITIVE Sensitive     PIP/TAZO <=4 SENSITIVE Sensitive     * KLEBSIELLA OZAENAE  Blood culture (routine x 2)     Status: None (Preliminary result)   Collection Time: 08/13/14 10:20 PM  Result Value Ref Range Status   Specimen Description BLOOD RIGHT ANTECUBITAL  Final   Special Requests   Final    BOTTLES DRAWN AEROBIC AND ANAEROBIC AER 5CC ANA 5 CC   Culture   Final           BLOOD CULTURE RECEIVED NO GROWTH TO DATE CULTURE WILL BE HELD FOR 5 DAYS BEFORE ISSUING A FINAL NEGATIVE REPORT Performed at [facility]    Report Status PENDING  Incomplete  Blood culture (routine x 2)     Status: None (Preliminary result)   Collection Time: 08/13/14 11:00 PM  Result Value Ref Range Status    Specimen Description BLOOD RIGHT HAND  Final   Special Requests   Final    BOTTLES DRAWN AEROBIC AND ANAEROBIC AER 10CC ANA 10CC   Culture   Final  BLOOD CULTURE RECEIVED NO GROWTH TO DATE CULTURE WILL BE HELD FOR 5 DAYS BEFORE ISSUING A FINAL NEGATIVE REPORT Performed at [facility]    Report Status PENDING  Incomplete  MRSA PCR Screening     Status: None   Collection Time: 08/14/14  3:21 AM  Result Value Ref Range Status   MRSA by PCR NEGATIVE NEGATIVE Final    Comment:        The GeneXpert MRSA Assay (FDA approved for NASAL specimens only), is one component of a comprehensive MRSA colonization surveillance program. It is not intended to diagnose MRSA infection nor to guide or monitor treatment for MRSA infections.      Labs: Basic Metabolic Panel:  Recent Labs Lab 08/13/14 2200 08/14/14 1047 08/15/14 0258 08/16/14 0525  NA 136 138 140 139  K 3.9 3.8 3.8 3.7  CL 101 110 108 103  CO2 GLUCOSE 125* 123* 91 75  BUN CREATININE 1.15 0.88 0.87 0.84  CALCIUM 8.9 8.0* 8.3* 8.9   Liver Function Tests: No results for input(s): AST, ALT, ALKPHOS, BILITOT, PROT, ALBUMIN in the last 168 hours. No results for input(s): LIPASE, AMYLASE in the last 168 hours. No results for input(s): AMMONIA in the last 168 hours. CBC:  Recent Labs Lab 08/13/14 2200 08/14/14 1047 08/15/14 0258 08/16/14 0525  WBC 10.1 11.2* 9.3 7.2  NEUTROABS 9.0*  --   --   --   HGB 14.7 13.1 12.9* 13.9  HCT 43.8 39.6 39.2 41.1  MCV 90.1 91.2 91.0 89.5  PLT 254 208 206 234   Cardiac Enzymes: No results for input(s): CKTOTAL, CKMB, CKMBINDEX, TROPONINI in the last 168 hours. BNP: BNP (last 3 results) No results for input(s): BNP in the last 8760 hours.  ProBNP (last 3 results) No results for input(s): PROBNP in the last 8760 hours.  CBG: No results for input(s): GLUCAP in the last 168 hours.     Signed:  Keiaira Donlan A  [facility] 08/16/2014, 1:35 PM

## 2014-08-16 NOTE — Progress Notes (Signed)
Charge nurse Andi explained the d/c instructions to patient and removed his IV

## 2014-08-17 ENCOUNTER — Other Ambulatory Visit: Payer: Self-pay | Admitting: Urology

## 2014-08-18 ENCOUNTER — Encounter (HOSPITAL_BASED_OUTPATIENT_CLINIC_OR_DEPARTMENT_OTHER): Payer: Self-pay | Admitting: *Deleted

## 2014-08-18 ENCOUNTER — Encounter: Payer: Self-pay | Admitting: Internal Medicine

## 2014-08-18 DIAGNOSIS — G4733 Obstructive sleep apnea (adult) (pediatric): Secondary | ICD-10-CM | POA: Insufficient documentation

## 2014-08-18 NOTE — Progress Notes (Signed)
NPO AFTER MN. ARRIVE AT 0745. CURRENT LAB RESULTS, CXR AND EKG IN CHART AND EPIC. WILL TAKE AM MEDS DOS W/ SIPS OF WATER.

## 2014-08-18 NOTE — Progress Notes (Signed)
   08/18/14 1247  OBSTRUCTIVE SLEEP APNEA  Have you ever been diagnosed with sleep apnea through a sleep study? No  Do you snore loudly (loud enough to be heard through closed doors)?  1  Do you often feel tired, fatigued, or sleepy during the daytime? 0  Has anyone observed you stop breathing during your sleep? 0  Do you have, or are you being treated for high blood pressure? 1  BMI more than 35 kg/m2? 0  Age over 56 years old? 1  Neck circumference greater than 40 cm/16 inches? 1  Gender: 1  Obstructive Sleep Apnea Score 5

## 2014-08-20 LAB — CULTURE, BLOOD (ROUTINE X 2)
Culture: NO GROWTH
Culture: NO GROWTH

## 2014-08-22 NOTE — H&P (Signed)
Active Problems Problems  1. Hematuria (R31.9) 2. Incomplete bladder emptying (R33.9) 3. Increased urinary frequency (R35.0) 4. Nocturia (R35.1) 5. Urge incontinence of urine (N39.41) 6. Weak urinary stream (R39.12)  History of Present Illness Charles MaduroRobert is a 54 yo WM who I was asked see by Dr. Sherron [name] for consideration of minimally invasive BPH procedures. He had retention a year ago and then in February following a decongestant and steroids. He had urodynamics and cystoscopy on 3/18 that showed a 3cm prostate with obstruction and high pressure voiding with incomplete emptying. He got a fever 24hr later and was in the ICU for 36hrs. He had a pan sensitive klebsiella and is now on ceftin. He was discharged yesterday. He had been on Rapaflo prior to the urodynamic study.   Past Medical History Problems  1. History of Anxiety (F41.9) 2. History of Coronary artery disease (I25.10) 3. History of cardiac disorder (Z86.79) 4. History of gout (Z87.39) 5. History of hypercholesterolemia (Z86.39) 6. History of hypertension (Z86.79)  Surgical History Problems  1. History of Cath Stent Placement 2. History of Cystoscopy (Diagnostic) 3. History of Injection Of Ganglion Cyst 4. History of Simple Bunion Exostectomy (Silver Procedure)  Current Meds 1. Allopurinol 300 MG Oral Tablet;  Therapy: (Recorded:15Dec2014) to Recorded 2. Cefuroxime Axetil 500 MG Oral Tablet;  Therapy: (Recorded:23Mar2016) to Recorded 3. Crestor 10 MG Oral Tablet;  Therapy: (Recorded:15Dec2014) to Recorded 4. Folic Acid 1 MG Oral Tablet;  Therapy: (Recorded:15Dec2014) to Recorded 5. Lidex 0.05 % GEL;  Therapy: (Recorded:15Dec2014) to Recorded 6. Multi-Vitamin TABS;  Therapy: (Recorded:15Dec2014) to Recorded 7. Nitrostat 0.3 MG Sublingual Tablet Sublingual;  Therapy: (Recorded:15Dec2014) to Recorded 8. Omega 3 CAPS;  Therapy: (Recorded:15Dec2014) to Recorded 9. PROzac 20 MG Oral Capsule;  Therapy:  (Recorded:15Dec2014) to Recorded 10. Rapaflo 8 MG Oral Capsule;   Therapy: (Recorded:23Mar2016) to Recorded 11. Temovate CREA;   Therapy: (Recorded:15Dec2014) to Recorded 12. Toprol XL 100 MG Oral Tablet Extended Release 24 Hour;   Therapy: (Recorded:15Dec2014) to Recorded  Allergies Medication  1. Lidocaine HCl (PF) SOLN 2. Neomycin Sulfate TABS 3. Sulfa Drugs 4. Hydrochlorothiazide CAPS  Family History Problems  1. Family history of Presence of stent in LAD coronary artery : Brother  Social History Problems  1. Alcohol use   2 - 3 per month 2. Daily caffeine consumption, 2-3 servings a day 3. Death in the family, father   age 54 due to stroke 4. 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 Former smoker [phone](Z87.891)   less than a pack a day for 14 years, quit 15 years ago as of 05/10/13 office visit 6. Married  Review of Systems  Genitourinary: hematuria (with movement).  Constitutional: no fever (but he has some sweats. ).  Cardiovascular: no chest pain.  Respiratory: no shortness of breath.    Vitals Vital Signs [Data Includes: Last 1 Day]  Recorded: 23Mar2016 10:57AM  Blood Pressure: 129 / 86 Temperature: 97.6 F Heart Rate: 72  Physical Exam Constitutional: Well nourished and well developed . No acute distress.  Pulmonary: No respiratory distress and normal respiratory rhythm and effort.  Cardiovascular: Heart rate and rhythm are normal . No peripheral edema.    Results/Data  Old records or history reviewed: office notes reviewed.  The following images/tracing/specimen were independently visualized:  Prostate US today shows a volume of 72.4257ml with a length of 5.89cm a Width of 6.12 and a Height of 3.85cm. There are normal SV's. The peripheral zone is thin and symmetric with normal echotexture.  The TZ is enlarged with a hazy area on the right in the mid-lateral prostate that is 1.9cm and could be inflammation. Multiple small cysts are  noted as well.  The following medical tests were reviewed: I have reviewed his urodynamics study.    Assessment Assessed  1. Benign prostatic hyperplasia with urinary obstruction (N40.1,N13.8) 2. Incomplete bladder emptying (R33.9)  Charles [name] has BPH with BOO and retention and had sepsis following recent UDS and cystoscopy.  His UCx here was K. Pneumonia with ESBL but the hospital culture was pansensitive K. Pneumonia and his fever has resolved on therapy. He just started a week of ceftin.  His prostate is 72ml on [place] today and he doesn't have a middle lobe.  There is a hazy area in the right prostate that could be inflammatory.   Plan Benign prostatic hyperplasia with urinary obstruction  1. Follow-up Schedule Surgery Office  Follow-up  Status: Complete  Done: 23Mar2016 2. PROSTATE U/S; Status:Resulted - Requires Verification;   Done: 23Mar2016 12:00AM  I spent about 25 minutes in discussion regarding his treatment options.  He was sent to me for consideration of minimally invasive therapy.  I discussed the options including Urolift with the understanding that his prostate is toward the upper limit for this procedure and that I haven't done any but would be willing to.  The advanatges of this procedure are the low risk of sexual side effects and the immediate efficacy. I discussed CTT and reviewed the risks and explained the advantages of low risk of sexual side effects and bleeding but more prolonged catheter time that is less predictable and also that it would be a few weeks before we could get it done.  I discussed TURP and it's risks and benefits in detail and also the Greenlight procedure. After reviewing the options we are going to try to get him set up for the Greenlight procedure since it has rapid efficacy and a lower risk of bleeding. He is on ASA for his stent and that will be held. I reviewed the risks of ejaculatory dysfunction, erectile dysfunction, need for secondary procedures,  bleeding, infection, stricturing, persistent irritative symptoms and incontinence, thrombotic events and anesthetic risks. I have given him a handout on the Greenlight.   I will keep him on the Ceftin for about 2 weeks total particularly with the prostate findings on [place].   Discussion/Summary CC :Dr. Lorin Picket [name].

## 2014-08-23 ENCOUNTER — Encounter (HOSPITAL_BASED_OUTPATIENT_CLINIC_OR_DEPARTMENT_OTHER)
Admission: RE | Disposition: A | Discharge status: Home / Self Care | Payer: Self-pay | Source: Ambulatory Visit | Attending: Urology

## 2014-08-23 ENCOUNTER — Ambulatory Visit (HOSPITAL_BASED_OUTPATIENT_CLINIC_OR_DEPARTMENT_OTHER): Payer: 59 | Admitting: Anesthesiology

## 2014-08-23 ENCOUNTER — Encounter (HOSPITAL_BASED_OUTPATIENT_CLINIC_OR_DEPARTMENT_OTHER): Payer: Self-pay | Admitting: *Deleted

## 2014-08-23 ENCOUNTER — Ambulatory Visit (HOSPITAL_BASED_OUTPATIENT_CLINIC_OR_DEPARTMENT_OTHER)
Admission: RE | Admit: 2014-08-23 | Discharge: 2014-08-23 | Disposition: A | Discharge status: Home / Self Care | Payer: 59 | Source: Ambulatory Visit | Attending: Urology | Admitting: Urology

## 2014-08-23 DIAGNOSIS — R351 Nocturia: Secondary | ICD-10-CM | POA: Diagnosis not present

## 2014-08-23 DIAGNOSIS — N401 Enlarged prostate with lower urinary tract symptoms: Secondary | ICD-10-CM | POA: Diagnosis not present

## 2014-08-23 DIAGNOSIS — I2510 Atherosclerotic heart disease of native coronary artery without angina pectoris: Secondary | ICD-10-CM | POA: Diagnosis not present

## 2014-08-23 DIAGNOSIS — R3912 Poor urinary stream: Secondary | ICD-10-CM | POA: Insufficient documentation

## 2014-08-23 DIAGNOSIS — R3914 Feeling of incomplete bladder emptying: Secondary | ICD-10-CM | POA: Insufficient documentation

## 2014-08-23 DIAGNOSIS — Z882 Allergy status to sulfonamides status: Secondary | ICD-10-CM | POA: Diagnosis not present

## 2014-08-23 DIAGNOSIS — Z884 Allergy status to anesthetic agent status: Secondary | ICD-10-CM | POA: Insufficient documentation

## 2014-08-23 DIAGNOSIS — N3941 Urge incontinence: Secondary | ICD-10-CM | POA: Diagnosis not present

## 2014-08-23 DIAGNOSIS — Z888 Allergy status to other drugs, medicaments and biological substances status: Secondary | ICD-10-CM | POA: Diagnosis not present

## 2014-08-23 DIAGNOSIS — F419 Anxiety disorder, unspecified: Secondary | ICD-10-CM | POA: Insufficient documentation

## 2014-08-23 DIAGNOSIS — E780 Pure hypercholesterolemia: Secondary | ICD-10-CM | POA: Diagnosis not present

## 2014-08-23 DIAGNOSIS — Z87891 Personal history of nicotine dependence: Secondary | ICD-10-CM | POA: Diagnosis not present

## 2014-08-23 DIAGNOSIS — M109 Gout, unspecified: Secondary | ICD-10-CM | POA: Insufficient documentation

## 2014-08-23 DIAGNOSIS — R319 Hematuria, unspecified: Secondary | ICD-10-CM | POA: Insufficient documentation

## 2014-08-23 DIAGNOSIS — Z79899 Other long term (current) drug therapy: Secondary | ICD-10-CM | POA: Insufficient documentation

## 2014-08-23 DIAGNOSIS — I10 Essential (primary) hypertension: Secondary | ICD-10-CM | POA: Insufficient documentation

## 2014-08-23 DIAGNOSIS — R338 Other retention of urine: Secondary | ICD-10-CM | POA: Insufficient documentation

## 2014-08-23 HISTORY — DX: Personal history of other infectious and parasitic diseases: Z86.19

## 2014-08-23 HISTORY — PX: GREEN LIGHT LASER TURP (TRANSURETHRAL RESECTION OF PROSTATE: SHX6260

## 2014-08-23 HISTORY — DX: Presence of urogenital implants: Z96.0

## 2014-08-23 HISTORY — DX: Diverticulosis of large intestine without perforation or abscess without bleeding: K57.30

## 2014-08-23 HISTORY — DX: Dorsopathy, unspecified: M53.9

## 2014-08-23 HISTORY — DX: Other retention of urine: R33.8

## 2014-08-23 HISTORY — DX: Benign prostatic hyperplasia with lower urinary tract symptoms: N40.1

## 2014-08-23 HISTORY — DX: Personal history of other diseases of the respiratory system: Z87.09

## 2014-08-23 HISTORY — DX: Presence of coronary angioplasty implant and graft: Z95.5

## 2014-08-23 HISTORY — DX: Presence of other specified devices: Z97.8

## 2014-08-23 SURGERY — GREEN LIGHT LASER TURP (TRANSURETHRAL RESECTION OF PROSTATE
Anesthesia: General | Site: Prostate

## 2014-08-23 MED ORDER — LACTATED RINGERS IV SOLN
INTRAVENOUS | Status: DC
  Filled 2014-08-23: qty 1000

## 2014-08-23 MED ORDER — SODIUM CHLORIDE 0.9 % IJ SOLN
3.0000 mL | Freq: Two times a day (BID) | INTRAMUSCULAR | Status: DC
  Filled 2014-08-23: qty 3

## 2014-08-23 MED ORDER — CEFAZOLIN SODIUM-DEXTROSE 2-3 GM-% IV SOLR
2.0000 g | INTRAVENOUS | Status: AC
  Administered 2014-08-23: 2 g via INTRAVENOUS
  Filled 2014-08-23: qty 50

## 2014-08-23 MED ORDER — FENTANYL CITRATE 0.05 MG/ML IJ SOLN
25.0000 ug | INTRAMUSCULAR | Status: DC | PRN
  Filled 2014-08-23: qty 1

## 2014-08-23 MED ORDER — MIDAZOLAM HCL 2 MG/2ML IJ SOLN
INTRAMUSCULAR | Status: AC
  Filled 2014-08-23: qty 2

## 2014-08-23 MED ORDER — ACETAMINOPHEN 650 MG RE SUPP
650.0000 mg | RECTAL | Status: DC | PRN
  Filled 2014-08-23: qty 1

## 2014-08-23 MED ORDER — MIDAZOLAM HCL 5 MG/5ML IJ SOLN
INTRAMUSCULAR | Status: DC | PRN
  Administered 2014-08-23 (×2): 1 mg via INTRAVENOUS

## 2014-08-23 MED ORDER — LACTATED RINGERS IV SOLN
INTRAVENOUS | Status: DC
  Administered 2014-08-23 (×2): via INTRAVENOUS
  Filled 2014-08-23: qty 1000

## 2014-08-23 MED ORDER — ACETAMINOPHEN 10 MG/ML IV SOLN
INTRAVENOUS | Status: DC | PRN
Start: 2014-08-23 — End: 2014-08-23
  Administered 2014-08-23: 1000 mg via INTRAVENOUS

## 2014-08-23 MED ORDER — CEFAZOLIN SODIUM-DEXTROSE 2-3 GM-% IV SOLR
INTRAVENOUS | Status: AC
  Filled 2014-08-23: qty 50

## 2014-08-23 MED ORDER — HYDROCODONE-ACETAMINOPHEN 5-325 MG PO TABS: 1.0000 | ORAL_TABLET | Freq: Four times a day (QID) | ORAL | Status: DC | PRN

## 2014-08-23 MED ORDER — OXYCODONE HCL 5 MG PO TABS
5.0000 mg | ORAL_TABLET | ORAL | Status: DC | PRN
  Filled 2014-08-23: qty 2

## 2014-08-23 MED ORDER — CEFUROXIME AXETIL 250 MG PO TABS: 250.0000 mg | ORAL_TABLET | Freq: Two times a day (BID) | ORAL | Status: DC

## 2014-08-23 MED ORDER — FENTANYL CITRATE 0.05 MG/ML IJ SOLN
INTRAMUSCULAR | Status: AC
  Filled 2014-08-23: qty 6

## 2014-08-23 MED ORDER — DEXAMETHASONE SODIUM PHOSPHATE 10 MG/ML IJ SOLN
INTRAMUSCULAR | Status: DC | PRN
  Administered 2014-08-23: 10 mg via INTRAVENOUS

## 2014-08-23 MED ORDER — FENTANYL CITRATE 0.05 MG/ML IJ SOLN
INTRAMUSCULAR | Status: DC | PRN
Start: 2014-08-23 — End: 2014-08-23
  Administered 2014-08-23 (×3): 25 ug via INTRAVENOUS
  Administered 2014-08-23: 50 ug via INTRAVENOUS
  Administered 2014-08-23: 25 ug via INTRAVENOUS

## 2014-08-23 MED ORDER — SODIUM CHLORIDE 0.9 % IV SOLN
250.0000 mL | INTRAVENOUS | Status: DC | PRN
  Filled 2014-08-23: qty 250

## 2014-08-23 MED ORDER — SODIUM CHLORIDE 0.9 % IR SOLN
Status: DC | PRN
  Administered 2014-08-23: 6000 mL
  Administered 2014-08-23 (×2): 3000 mL
  Administered 2014-08-23: 1000 mL

## 2014-08-23 MED ORDER — PROMETHAZINE HCL 25 MG/ML IJ SOLN
6.2500 mg | INTRAMUSCULAR | Status: DC | PRN
  Filled 2014-08-23: qty 1

## 2014-08-23 MED ORDER — SODIUM CHLORIDE 0.9 % IJ SOLN
3.0000 mL | INTRAMUSCULAR | Status: DC | PRN
  Filled 2014-08-23: qty 3

## 2014-08-23 MED ORDER — MEPERIDINE HCL 25 MG/ML IJ SOLN
6.2500 mg | INTRAMUSCULAR | Status: DC | PRN
  Filled 2014-08-23: qty 1

## 2014-08-23 MED ORDER — ACETAMINOPHEN 325 MG PO TABS
650.0000 mg | ORAL_TABLET | ORAL | Status: DC | PRN
  Filled 2014-08-23: qty 2

## 2014-08-23 MED ORDER — KETOROLAC TROMETHAMINE 30 MG/ML IJ SOLN
INTRAMUSCULAR | Status: DC | PRN
  Administered 2014-08-23: 30 mg via INTRAVENOUS

## 2014-08-23 MED ORDER — ONDANSETRON HCL 4 MG/2ML IJ SOLN
INTRAMUSCULAR | Status: DC | PRN
  Administered 2014-08-23: 4 mg via INTRAVENOUS

## 2014-08-23 MED ORDER — PROPOFOL 10 MG/ML IV BOLUS
INTRAVENOUS | Status: DC | PRN
  Administered 2014-08-23: 200 mg via INTRAVENOUS

## 2014-08-23 SURGICAL SUPPLY — 29 items
20FR 5CC SILICONE CATHETER ×2 IMPLANT
BAG URINE DRAINAGE (UROLOGICAL SUPPLIES) ×3 IMPLANT
BAG URO CATCHER STRL LF (DRAPE) ×3 IMPLANT
CANISTER SUCT LVC 12 LTR MEDI- (MISCELLANEOUS) ×2 IMPLANT
CLOTH BEACON ORANGE TIMEOUT ST (SAFETY) ×3 IMPLANT
ELECT BIVAP BIPO 22/24 DONUT (ELECTROSURGICAL)
ELECT BUTTON HF 24-28F 2 30DE (ELECTRODE) IMPLANT
ELECT LOOP MED HF 24F 12D (CUTTING LOOP) IMPLANT
ELECTRD BIVAP BIPO 22/24 DONUT (ELECTROSURGICAL) IMPLANT
FEE RENTAL LASER GREENLIGHT (Laser) ×1 IMPLANT
GLOVE BIOGEL PI IND STRL 6.5 (GLOVE) IMPLANT
GLOVE BIOGEL PI INDICATOR 6.5 (GLOVE) ×2
GLOVE INDICATOR 7.5 STRL GRN (GLOVE) ×2 IMPLANT
GLOVE SURG SS PI 6.5 STRL IVOR (GLOVE) ×2 IMPLANT
GLOVE SURG SS PI 7.5 STRL IVOR (GLOVE) ×2 IMPLANT
GLOVE SURG SS PI 8.0 STRL IVOR (GLOVE) ×3 IMPLANT
GOWN STRL REUS W/ TWL XL LVL3 (GOWN DISPOSABLE) IMPLANT
GOWN STRL REUS W/TWL XL LVL3 (GOWN DISPOSABLE) ×8 IMPLANT
HOLDER FOLEY CATH W/STRAP (MISCELLANEOUS) ×2 IMPLANT
IV NS 1000ML (IV SOLUTION) ×3
IV NS 1000ML BAXH (IV SOLUTION) ×1 IMPLANT
IV NS IRRIG 3000ML ARTHROMATIC (IV SOLUTION) ×9 IMPLANT
LASER FIBER /GREENLIGHT LASER (Laser) ×3 IMPLANT
LASER GREENLIGHT RENTAL P/PROC (Laser) ×3 IMPLANT
LOOP CUT BIPOLAR 24F LRG (ELECTROSURGICAL) IMPLANT
PACK CYSTO (CUSTOM PROCEDURE TRAY) ×3 IMPLANT
SYR 30ML LL (SYRINGE) IMPLANT
SYRINGE IRR TOOMEY STRL 70CC (SYRINGE) IMPLANT
WATER STERILE IRR 500ML POUR (IV SOLUTION) ×2 IMPLANT

## 2014-08-23 NOTE — Progress Notes (Signed)
No lidocaine was used to start iv

## 2014-08-23 NOTE — Anesthesia Procedure Notes (Signed)
Procedure Name: LMA Insertion Date/Time: 08/23/2014 9:20 AM Performed by: Jessica PriestBEESON, Charles Georg C Pre-anesthesia Checklist: Patient identified, Emergency Drugs available, Suction available and Patient being monitored Patient Re-evaluated:Patient Re-evaluated prior to inductionOxygen Delivery Method: Circle System Utilized Preoxygenation: Pre-oxygenation with 100% oxygen Intubation Type: IV induction Ventilation: Mask ventilation without difficulty LMA: LMA inserted LMA Size: 5.0 Number of attempts: 1 Airway Equipment and Method: Bite block Placement Confirmation: positive ETCO2 Tube secured with: Tape Dental Injury: Teeth and Oropharynx as per pre-operative assessment

## 2014-08-23 NOTE — Transfer of Care (Signed)
Immediate Anesthesia Transfer of Care Note  Patient: Charles BoucheRobert [name]  Procedure(s) Performed: Procedure(s) (LRB): GREEN LIGHT LASER TURP (TRANSURETHRAL RESECTION OF PROSTATE (N/A)  Patient Location: PACU  Anesthesia Type: General  Level of Consciousness: awake, sedated, patient cooperative and responds to stimulation  Airway & Oxygen Therapy: Patient Spontanous Breathing and Patient connected to face mask oxygen  Post-op Assessment: Report given to PACU RN, Post -op Vital signs reviewed and stable and Patient moving all extremities  Post vital signs: Reviewed and stable  Complications: No apparent anesthesia complications

## 2014-08-23 NOTE — Interval H&P Note (Signed)
History and Physical Interval Note:  08/23/2014 9:08 AM  Charles [name]  has presented today for surgery, with the diagnosis of BENIGN PROSTATIC HYPERPLASIA WITH RETENTION  The various methods of treatment have been discussed with the patient and family. After consideration of risks, benefits and other options for treatment, the patient has consented to  Procedure(s): GREEN LIGHT LASER TURP (TRANSURETHRAL RESECTION OF PROSTATE (N/A) as a surgical intervention .  The patient's history has been reviewed, patient examined, no change in status, stable for surgery.  I have reviewed the patient's chart and labs.  Questions were answered to the patient's satisfaction.     Anaston Koehn J

## 2014-08-23 NOTE — Discharge Instructions (Addendum)
Prostate Laser Surgery, Care After Refer to this sheet in the next few weeks. These instructions provide you with information on caring for yourself after your procedure. Your health care provider may also give you more specific instructions. Your treatment has been planned according to current medical practices, but problems sometimes occur. Call your health care provider if you have any problems or questions after your procedure. WHAT TO EXPECT AFTER THE PROCEDURE  You may notice blood in your urine that could last up to 3 weeks.  After your catheter is removed, you will have burning (especially at the tip of your penis) when you urinate, especially at the end of urination. For the first few weeks after your procedure, you will feel the need to urinate often. HOME CARE INSTRUCTIONS   Do not perform vigorous exercise, especially heavy lifting, for 1 week or as directed by your health care provider.  Avoid sexual activity for 4-6 weeks or as directed by your health care provider.  Do not ride in a car for extended periods for 1 month or as directed by your health care provider.  Do not strain to have a bowel movement. Drink a lot of fluids and and make sure you get enough fiber in your diet.  Drink enough fluids to keep your urine clear or pale yellow. SEEK IMMEDIATE MEDICAL CARE IF:   Your catheter has been removed and you are suddenly unable to urinate.  Your catheter has not been removed, and it develops a blockage.  You start to have blood clots in your urine.  The blood in your urine becomes persistent or gets thick.  Your temperature is greater than 100.71F (38.1C).  You develop chest pains.  You develop shortness of breath.  You develop leg swelling or pain. MAKE SURE YOU:  Understand these instructions.  Will watch your condition.  Will get help right away if you are not doing well or get worse. Document Released: 05/13/2005 Document Revised: 05/18/2013 Document  Reviewed: 11/02/2012 [facility] Patient Information 2015 [place], [place]. This information is not intended to replace advice given to you by your health care provider. Make sure you discuss any questions you have with your health care provider.    You may either remove the catheter at home by cutting the side arm below the nipple and after a small amount of fluid drains, the catheter should slide out easily, or if you prefer you can come to the office in the morning to have it removed.     Avoid strenous active and heavy lifting > 10lbs for 2 weeks.  Light activity can be resumed in a week.      Post Anesthesia Home Care Instructions  Activity: Get plenty of rest for the remainder of the day. A responsible adult should stay with you for 24 hours following the procedure.  For the next 24 hours, DO NOT: -Drive a car -Advertising copywriterperate machinery -Drink alcoholic beverages -Take any medication unless instructed by your physician -Make any legal decisions or sign important papers.  Meals: Start with liquid foods such as gelatin or soup. Progress to regular foods as tolerated. Avoid greasy, spicy, heavy foods. If nausea and/or vomiting occur, drink only clear liquids until the nausea and/or vomiting subsides. Call your physician if vomiting continues.  Special Instructions/Symptoms: Your throat may feel dry or sore from the anesthesia or the breathing tube placed in your throat during surgery. If this causes discomfort, gargle with warm salt water. The discomfort should disappear within 24 hours.

## 2014-08-23 NOTE — Anesthesia Postprocedure Evaluation (Signed)
  Anesthesia Post-op Note  Patient: Charles BoucheRobert Rodrigues  Procedure(s) Performed: Procedure(s) (LRB): GREEN LIGHT LASER TURP (TRANSURETHRAL RESECTION OF PROSTATE (N/A)  Patient Location: PACU  Anesthesia Type: General  Level of Consciousness: awake and alert   Airway and Oxygen Therapy: Patient Spontanous Breathing  Post-op Pain: mild  Post-op Assessment: Post-op Vital signs reviewed, Patient's Cardiovascular Status Stable, Respiratory Function Stable, Patent Airway and No signs of Nausea or vomiting  Last Vitals:  Filed Vitals:   08/23/14 1215  BP: 129/70  Pulse: 76  Temp: 36.6 C  Resp: 16    Post-op Vital Signs: stable   Complications: No apparent anesthesia complications

## 2014-08-23 NOTE — Brief Op Note (Signed)
08/23/2014  10:18 AM  PATIENT:  Charles [name]  54 y.o. male  PRE-OPERATIVE DIAGNOSIS:  BENIGN PROSTATIC HYPERPLASIA WITH RETENTION  POST-OPERATIVE DIAGNOSIS:  BENIGN PROSTATIC HYPERPLASIA WITH RETENTION  PROCEDURE:  Procedure(s): GREEN LIGHT LASER TURP (TRANSURETHRAL RESECTION OF PROSTATE (N/A)  SURGEON:  Surgeon(s) and Role:    * Bjorn PippinJohn Laurence Folz, MD - Primary  PHYSICIAN ASSISTANT:   ASSISTANTS: none   ANESTHESIA:   general  EBL:  Total I/O In: 1000 [I.V.:1000] Out: -   BLOOD ADMINISTERED:none  DRAINS: Urinary Catheter (Foley)   LOCAL MEDICATIONS USED:  NONE  SPECIMEN:  No Specimen  DISPOSITION OF SPECIMEN:  N/A  COUNTS:  YES  TOURNIQUET:  * No tourniquets in log *  DICTATION: .Other Dictation: Dictation Number [id number]  PLAN OF CARE: Discharge to home after PACU  PATIENT DISPOSITION:  PACU - hemodynamically stable.   Delay start of Pharmacological VTE agent (>24hrs) due to surgical blood loss or risk of bleeding: not applicable

## 2014-08-23 NOTE — Anesthesia Preprocedure Evaluation (Signed)
Anesthesia Evaluation  Patient identified by MRN, date of birth, ID band Patient awake    Reviewed: Allergy & Precautions, NPO status , Patient's Chart, lab work & pertinent test results  Airway Mallampati: II  TM Distance: >3 FB Neck ROM: Full    Dental no notable dental hx.    Pulmonary former smoker,  breath sounds clear to auscultation  Pulmonary exam normal       Cardiovascular hypertension, Pt. on medications - angina+ CAD and + Cardiac Stents (DES 2009 LAD) Rhythm:Regular Rate:Normal     Neuro/Psych negative neurological ROS  negative psych ROS   GI/Hepatic negative GI ROS, Neg liver ROS,   Endo/Other  negative endocrine ROS  Renal/GU negative Renal ROS  negative genitourinary   Musculoskeletal negative musculoskeletal ROS (+)   Abdominal   Peds negative pediatric ROS (+)  Hematology negative hematology ROS (+)   Anesthesia Other Findings   Reproductive/Obstetrics negative OB ROS                             Anesthesia Physical Anesthesia Plan  ASA: III  Anesthesia Plan: General   Post-op Pain Management:    Induction: Intravenous  Airway Management Planned: LMA  Additional Equipment:   Intra-op Plan:   Post-operative Plan: Extubation in OR  Informed Consent: I have reviewed the patients History and Physical, chart, labs and discussed the procedure including the risks, benefits and alternatives for the proposed anesthesia with the patient or authorized representative who has indicated his/her understanding and acceptance.   Dental advisory given  Plan Discussed with: CRNA  Anesthesia Plan Comments:         Anesthesia Quick Evaluation

## 2014-08-24 ENCOUNTER — Encounter (HOSPITAL_BASED_OUTPATIENT_CLINIC_OR_DEPARTMENT_OTHER): Payer: Self-pay | Admitting: Urology

## 2014-08-24 NOTE — Op Note (Signed)
NAME:  Charles ShearerJASINSKI, Keenon             ACCOUNT NO.:  [id number]  MEDICAL RECORD NO.:  [id number]  LOCATION:                                 FACILITY:  PHYSICIAN:  Excell SeltzerJohn J. Annabell HowellsWrenn, M.D.    DATE OF BIRTH:  [birth year]  DATE OF PROCEDURE:  08/23/2014 DATE OF DISCHARGE:  08/23/2014                              OPERATIVE REPORT   PROCEDURE:  GreenLight laser prostatectomy.  PREOPERATIVE DIAGNOSIS:  Benign prostatic hypertrophy with bladder outlet obstruction and retention.  POSTOPERATIVE DIAGNOSIS:  Benign prostatic hypertrophy with bladder outlet obstruction and retention.  SURGEON:  Excell SeltzerJohn J. Annabell HowellsWrenn, M.D.  ANESTHESIA:  General.  SPECIMEN:  None.  DRAINS:  A 20-French Foley catheter.  BLOOD LOSS:  Minimal.  COMPLICATIONS:  None.  INDICATIONS:  Mr. Ashley AkinJasinski is a 54 year old white male with retention, who has elected GreenLight laser therapy for his bladder outlet obstruction.  FINDINGS OF PROCEDURE:  He had been on Ceftin preoperatively for Klebsiella urinary tract infection and was afebrile after admission over the previous weekend approximately 10 days ago for sepsis.  He was given Ancef 2 g.  He was taken to the operating room where general anesthetic was induced.  He was placed in lithotomy position.  His Foley catheter was removed.  He was prepped with Betadine solution and he was draped in usual sterile fashion.  He had been fitted with PAS hose as well.  The 22-French laser scope was then passed, this was fitted with a 30- degree lens and the GreenLight laser fiber was prepared and inserted. Inspection revealed normal urethra, the external sphincter was intact. The prostatic urethra was approximately 4 cm in length with bilobar hyperplasia and obstruction.  There was no significant middle lobe or bladder neck elevation.  Examination of the bladder revealed mild-to- moderate trabeculation.  No tumors or stones were noted.  There was some catheter irritation on the posterior  wall.  The ureteral orifices were unremarkable and well away from the bladder neck.  Once inspection had been completed, the laser fiber was passed.  The power was initially set on 80 watts and ablation was initiated at the bladder neck with care taken not to extend into the bladder and through the ureteral orifices.  Once the bladder neck had been opened, the power was increased to 120 watts and ablation was carried out to create an adequate channel from bladder neck to apex.  Once an adequate channel had been created, the scope was removed, pressure on the bladder produced an excellent stream.  The scope was then reinserted, some additional lateral lobe ablation was performed to smooth up the surfaces.  There was some minor bleeding anteriorly at the apex, it was controlled with the coagulation setting. Once the ablation was complete and hemostasis was achieved, the bladder was initially drained and refilled.  The scope was removed once again confirming a good stream.  A 20-French non-latex Foley was placed without difficulty.  The balloon was filled with 10 mL of sterile fluid and then irrigated with clear return.  The patient was taken down from lithotomy position.  His anesthetic was reversed.  He was moved to the recovery room in stable condition.  There  were no complications.  He received 60 minutes of laser time with a total of 91,188 joules of energy applied.     Excell Seltzer. Annabell Howells, M.D.     JJW/MEDQ  D:  08/23/2014  T:  08/23/2014  Job:  [id number]

## 2014-08-26 ENCOUNTER — Other Ambulatory Visit: Payer: Self-pay | Admitting: Internal Medicine

## 2014-08-29 ENCOUNTER — Other Ambulatory Visit: Payer: Self-pay | Admitting: Internal Medicine

## 2014-08-29 ENCOUNTER — Encounter: Payer: Self-pay | Admitting: Internal Medicine

## 2014-08-29 DIAGNOSIS — G4733 Obstructive sleep apnea (adult) (pediatric): Secondary | ICD-10-CM

## 2014-08-29 NOTE — Telephone Encounter (Signed)
04/04/15 last office visit

## 2014-08-29 NOTE — Telephone Encounter (Signed)
OK X 3 mos 

## 2014-09-02 ENCOUNTER — Other Ambulatory Visit: Payer: Self-pay | Admitting: Internal Medicine

## 2014-09-19 DIAGNOSIS — A4189 Other specified sepsis: Secondary | ICD-10-CM | POA: Insufficient documentation

## 2014-09-25 ENCOUNTER — Emergency Department
Admission: EM | Admit: 2014-09-25 | Discharge: 2014-09-25 | Disposition: A | Discharge status: Home / Self Care | Payer: 59 | Source: Home / Self Care | Attending: Emergency Medicine | Admitting: Emergency Medicine

## 2014-09-25 ENCOUNTER — Encounter: Payer: Self-pay | Admitting: *Deleted

## 2014-09-25 DIAGNOSIS — H8111 Benign paroxysmal vertigo, right ear: Secondary | ICD-10-CM

## 2014-09-25 MED ORDER — MECLIZINE HCL 25 MG PO TABS: 25.0000 mg | ORAL_TABLET | Freq: Four times a day (QID) | ORAL | Status: DC

## 2014-09-25 NOTE — ED Notes (Signed)
Pt woke up 2 days ago with dizziness when attempting to get out of bed.  He is not dizzy when laying down or reclined.  Pt describes as room spinning.

## 2014-09-25 NOTE — Discharge Instructions (Signed)
Benign Positional Vertigo °Vertigo means you feel like you or your surroundings are moving when they are not. Benign positional vertigo is the most common form of vertigo. Benign means that the cause of your condition is not serious. Benign positional vertigo is more common in older adults. °CAUSES  °Benign positional vertigo is the result of an upset in the labyrinth system. This is an area in the middle ear that helps control your balance. This may be caused by a viral infection, head injury, or repetitive motion. However, often no specific cause is found. °SYMPTOMS  °Symptoms of benign positional vertigo occur when you move your head or eyes in different directions. Some of the symptoms may include: °1. Loss of balance and falls. °2. Vomiting. °3. Blurred vision. °4. Dizziness. °5. Nausea. °6. Involuntary eye movements (nystagmus). °DIAGNOSIS  °Benign positional vertigo is usually diagnosed by physical exam. If the specific cause of your benign positional vertigo is unknown, your caregiver may perform imaging tests, such as magnetic resonance imaging (MRI) or computed tomography (CT). °TREATMENT  °Your caregiver may recommend movements or procedures to correct the benign positional vertigo. Medicines such as meclizine, benzodiazepines, and medicines for nausea may be used to treat your symptoms. In rare cases, if your symptoms are caused by certain conditions that affect the inner ear, you may need surgery. °HOME CARE INSTRUCTIONS  °· Follow your caregiver's instructions. °· Move slowly. Do not make sudden body or head movements. °· Avoid driving. °· Avoid operating heavy machinery. °· Avoid performing any tasks that would be dangerous to you or others during a vertigo episode. °· Drink enough fluids to keep your urine clear or pale yellow. °SEEK IMMEDIATE MEDICAL CARE IF:  °· You develop problems with walking, weakness, numbness, or using your arms, hands, or legs. °· You have difficulty speaking. °· You develop  severe headaches. °· Your nausea or vomiting continues or gets worse. °· You develop visual changes. °· Your family or friends notice any behavioral changes. °· Your condition gets worse. °· You have a fever. °· You develop a stiff neck or sensitivity to light. °MAKE SURE YOU:  °· Understand these instructions. °· Will watch your condition. °· Will get help right away if you are not doing well or get worse. °Document Released: 02/18/2006 Document Revised: 08/05/2011 Document Reviewed: 01/31/2011 °ExitCare® Patient Information ©2015 [employer]. This information is not intended to replace advice given to you by your health care provider. Make sure you discuss any questions you have with your health care provider. ° °Epley Maneuver Self-Care °WHAT IS THE EPLEY MANEUVER? °The Epley maneuver is an exercise you can do to relieve symptoms of benign paroxysmal positional vertigo (BPPV). This condition is often just referred to as vertigo. BPPV is caused by the movement of tiny crystals (canaliths) inside your inner ear. The accumulation and movement of canaliths in your inner ear causes a sudden spinning sensation (vertigo) when you move your head to certain positions. Vertigo usually lasts about 30 seconds. BPPV usually occurs in just one ear. If you get vertigo when you lie on your left side, you probably have BPPV in your left ear. Your health care provider can tell you which ear is involved.  °BPPV may be caused by a head injury. Many people older than 50 get BPPV for unknown reasons. If you have been diagnosed with BPPV, your health care provider may teach you how to do this maneuver. BPPV is not life threatening (benign) and usually goes away in time.  °  WHEN SHOULD I PERFORM THE EPLEY MANEUVER? °You can do this maneuver at home whenever you have symptoms of vertigo. You may do the Epley maneuver up to 3 times a day until your symptoms of vertigo go away. °HOW SHOULD I DO THE EPLEY MANEUVER? °7. Sit on the edge of a  bed or table with your back straight. Your legs should be extended or hanging over the edge of the bed or table.   °8. Turn your head halfway toward the affected ear.   °9. Lie backward quickly with your head turned until you are lying flat on your back. You may want to position a pillow under your shoulders.   °10. Hold this position for 30 seconds. You may experience an attack of vertigo. This is normal. Hold this position until the vertigo stops. °11. Then turn your head to the opposite direction until your unaffected ear is facing the floor.   °12. Hold this position for 30 seconds. You may experience an attack of vertigo. This is normal. Hold this position until the vertigo stops. °13. Now turn your whole body to the same side as your head. Hold for another 30 seconds.   °14. You can then sit back up. °ARE THERE RISKS TO THIS MANEUVER? °In some cases, you may have other symptoms (such as changes in your vision, weakness, or numbness). If you have these symptoms, stop doing the maneuver and call your health care provider. Even if doing these maneuvers relieves your vertigo, you may still have dizziness. Dizziness is the sensation of light-headedness but without the sensation of movement. Even though the Epley maneuver may relieve your vertigo, it is possible that your symptoms will return within 5 years. °WHAT SHOULD I DO AFTER THIS MANEUVER? °After doing the Epley maneuver, you can return to your normal activities. Ask your doctor if there is anything you should do at home to prevent vertigo. This may include: °· Sleeping with two or more pillows to keep your head elevated. °· Not sleeping on the side of your affected ear. °· Getting up slowly from bed. °· Avoiding sudden movements during the day. °· Avoiding extreme head movement, like looking up or bending over. °· Wearing a cervical collar to prevent sudden head movements. °WHAT SHOULD I DO IF MY SYMPTOMS GET WORSE? °Call your health care provider if your  vertigo gets worse. Call your provider right way if you have other symptoms, including:  °· Nausea. °· Vomiting. °· Headache. °· Weakness. °· Numbness. °· Vision changes. °Document Released: 05/18/2013 Document Reviewed: 05/18/2013 °ExitCare® Patient Information ©2015 [employer]. This information is not intended to replace advice given to you by your health care provider. Make sure you discuss any questions you have with your health care provider. ° °

## 2014-09-26 ENCOUNTER — Encounter: Payer: Self-pay | Admitting: Internal Medicine

## 2014-09-26 ENCOUNTER — Ambulatory Visit (INDEPENDENT_AMBULATORY_CARE_PROVIDER_SITE_OTHER): Payer: PRIVATE HEALTH INSURANCE | Admitting: Internal Medicine

## 2014-09-26 VITALS — BP 150/98 | HR 74 | Temp 98.2°F | Resp 15 | Ht 68.0 in | Wt 231.5 lb

## 2014-09-26 DIAGNOSIS — I10 Essential (primary) hypertension: Secondary | ICD-10-CM | POA: Diagnosis not present

## 2014-09-26 DIAGNOSIS — H8110 Benign paroxysmal vertigo, unspecified ear: Secondary | ICD-10-CM

## 2014-09-26 MED ORDER — SPIRONOLACTONE 25 MG PO TABS: 25.0000 mg | ORAL_TABLET | Freq: Every day | ORAL | Status: DC

## 2014-09-26 NOTE — ED Provider Notes (Signed)
CSN: [id number]     Arrival date & time 09/25/14  1322 History   First MD Initiated Contact with Patient 09/25/14 1349     Chief Complaint  Patient presents with  . Dizziness     (Consider location/radiation/quality/duration/timing/severity/associated sxs/prior Treatment) Patient is a 54 y.o. male presenting with dizziness. The history is provided by the patient. No language interpreter was used.  Dizziness Quality:  Head spinning and lightheadedness Severity:  Moderate Onset quality:  Sudden Duration:  2 days Timing:  Constant Progression:  Unchanged Chronicity:  New Context: head movement   Relieved by:  Being still and lying down Worsened by:  Nothing Ineffective treatments:  None tried Associated symptoms: palpitations   Associated symptoms: no chest pain, no headaches, no shortness of breath, no syncope, no vomiting and no weakness   Risk factors: no anemia, no heart disease, no hx of stroke, no hx of vertigo, no Meniere's disease, no multiple medications and no new medications     Past Medical History  Diagnosis Date  . Gout     per pt stable is of 08-18-2014  . Hyperlipidemia LDL goal <100   . Hypertension   . Psoriasis   . CAD (coronary artery disease) 04/2008    successful PCI of the lesion in the proximal LAD using xience drug-eluting stent with improvement in central narrowing from 95% to 0%.  Dr. Juanda Chance  . S/P drug eluting coronary stent placement     05-13-2008   DES X1 TO pLAD  . Sigmoid diverticulosis   . BPH (benign prostatic hypertrophy) with urinary retention   . History of sepsis     urosepsis--  08-13-2014 admission  . Multilevel degenerative disc disease     cervical and lumbar  . History of acute bronchitis     recently 07-16-2014  . Foley catheter in place   . At risk for sleep apnea     STOP-BANG= 5      SENT TO PCP 08-18-2014   Past Surgical History  Procedure Laterality Date  . Bunionectomy Right 2000  . Colonoscopy with propofol  last  one 10-06-2013  . Wrist ganglion excision Left 2001  . Coronary angioplasty with stent placement  05-13-2008    Dr Charlies Constable    PCI w/  DES x1 to  pLAD/  ef 65%  . Green light laser turp (transurethral resection of prostate N/A 08/23/2014    Procedure: GREEN LIGHT LASER TURP (TRANSURETHRAL RESECTION OF PROSTATE;  Surgeon: Bjorn Pippin, MD;  Location: [facility];  Service: Urology;  Laterality: N/A;   Family History  Problem Relation Age of Onset  . Hypertension Father   . Stroke Father 71  . Lung cancer Mother      smoker  . Diabetes Sister   . Heart disease Brother 49     stent LAD 2012  . Colon cancer Neg Hx   . Pancreatic cancer Neg Hx   . Rectal cancer Neg Hx   . Stomach cancer Neg Hx    History  Substance Use Topics  . Smoking status: Former Smoker -- 0.75 packs/day for 14 years    Types: Cigarettes    Quit date: 05/27/1997  . Smokeless tobacco: Former Neurosurgeon    Types: Snuff    Quit date: 08/17/1997  . Alcohol Use: 1.2 oz/week    2 Cans of beer per week     Comment: occasional    Review of Systems  Respiratory: Negative for shortness of breath.  Cardiovascular: Positive for palpitations. Negative for chest pain and syncope.  Gastrointestinal: Negative for vomiting.  Neurological: Positive for dizziness. Negative for weakness and headaches.  All other systems reviewed and are negative.     Allergies  Lidocaine; Neomycin sulfate; Sulfa drugs cross reactors; Flexeril; Hctz; Tramadol; Atrovent; and Latex  Home Medications   Prior to Admission medications   Medication Sig Start Date End Date Taking? Authorizing Provider  allopurinol (ZYLOPRIM) 300 MG tablet TAKE 1 TABLET BY MOUTH EVERY DAY Patient taking differently: Take 300 mg by mouth every morning. TAKE 1 TABLET BY MOUTH EVERY DAY 07/04/14   Pecola LawlessWilliam F Hopper, MD  aspirin 325 MG EC tablet Take 325 mg by mouth daily.    Historical Provider, MD  Aspirin-Acetaminophen-Caffeine (EXCEDRIN PO) Take 2  tablets by mouth daily as needed (headache).    Historical Provider, MD  Carboxymethylcellul-Glycerin 0.5-0.9 % SOLN Place 2 drops into both eyes 2 (two) times daily.    Historical Provider, MD  cefUROXime (CEFTIN) 250 MG tablet Take 1 tablet (250 mg total) by mouth 2 (two) times daily with a meal. 08/23/14   Bjorn PippinJohn Wrenn, MD  cetirizine (ZYRTEC) 10 MG tablet Take 10 mg by mouth at bedtime.    Historical Provider, MD  clobetasol cream (TEMOVATE) 0.05 % Apply 1 application topically 2 (two) times daily. Apply 1 application topically 2 (two) times daily as needed 11/22/13   Pecola LawlessWilliam F Hopper, MD  Coenzyme Q10 (CO Q 10 PO) Take 1 capsule by mouth every morning. 500mg     Historical Provider, MD  CRESTOR 10 MG tablet TAKE 1 TABLET DAILY 09/02/14   Pecola LawlessWilliam F Hopper, MD  fluocinonide (LIDEX) 0.05 % external solution Apply 1 application topically 2 (two) times daily. As needed Patient taking differently: Apply 1 application topically at bedtime as needed (on scalp for psoriaris). As needed 11/22/13   Pecola LawlessWilliam F Hopper, MD  FLUoxetine (PROZAC) 20 MG capsule TAKE 1 CAPSULE DAILY Patient taking differently: TAKE 1 CAPSULE DAILY---  takes in am 03/28/14   Pecola LawlessWilliam F Hopper, MD  FLUoxetine (PROZAC) 20 MG capsule TAKE 1 CAPSULE DAILY 08/29/14   Pecola LawlessWilliam F Hopper, MD  HYDROcodone-acetaminophen [facility](NORCO) 5-325 MG per tablet Take 1 tablet by mouth every 6 (six) hours as needed for moderate pain. 08/23/14   Bjorn PippinJohn Wrenn, MD  ibuprofen (ADVIL,MOTRIN) 200 MG tablet Take 200 mg by mouth every 6 (six) hours as needed. 2 tabs=400mg  per dose for muscle aches    Historical Provider, MD  meclizine (ANTIVERT) 25 MG tablet Take 1 tablet (25 mg total) by mouth 4 (four) times daily. 09/25/14   Elson AreasLeslie K Ayomide Zuleta, PA-C  metoprolol succinate (TOPROL-XL) 100 MG 24 hr tablet Take 1 tablet (100 mg total) by mouth daily. Patient taking differently: Take 100 mg by mouth every morning.  07/04/14   Pecola LawlessWilliam F Hopper, MD  Multiple Vitamin (MULTIVITAMIN) capsule Take  1 capsule by mouth every morning.     Historical Provider, MD  nitroGLYCERIN (NITROSTAT) 0.3 MG SL tablet Place 1 tablet (0.3 mg total) under the tongue every 5 (five) minutes as needed. 1 prn , can repeat in 5 minutes then ER 07/04/14   Pecola LawlessWilliam F Hopper, MD  Omega-3 Fatty Acids (FISH OIL) 1000 MG CAPS Take 1,000 mg by mouth every morning.     Historical Provider, MD   BP 133/93 mmHg  Pulse 72  Temp(Src) 98.5 F (36.9 C) (Oral)  Ht 5\' 8"  (1.727 m)  Wt 229 lb 8 oz (104.101 kg)  BMI 34.90  kg/m2  SpO2 98% Physical Exam  Constitutional: He is oriented to person, place, and time. He appears well-developed and well-nourished.  HENT:  Head: Normocephalic and atraumatic.  Right Ear: External ear normal.  Left Ear: External ear normal.  Nose: Nose normal.  Mouth/Throat: Oropharynx is clear and moist.  Eyes: Conjunctivae and EOM are normal. Pupils are equal, round, and reactive to light.  Neck: Normal range of motion.  Cardiovascular: Normal rate, regular rhythm and normal heart sounds.   Pulmonary/Chest: Effort normal.  Abdominal: Soft. He exhibits no distension.  Musculoskeletal: Normal range of motion.  Neurological: He is alert and oriented to person, place, and time.  Increased dizziness with turning head to right,  Slight right nystagmus,   Skin: Skin is warm.  Psychiatric: He has a normal mood and affect.  Nursing note and vitals reviewed.   ED Course  Procedures (including critical care time) Labs Review Labs Reviewed - No data to display  Imaging Review No results found.   EKG Interpretation None      MDM   Final diagnoses:  Benign paroxysmal positional vertigo, right    Antivert See Dr. Alwyn Ren for recheck Return if any problems. AVS    Elson Areas, PA-C 09/26/14 0727  Elson Areas, PA-C 09/26/14 [phone]

## 2014-09-26 NOTE — Progress Notes (Signed)
   Subjective:    Patient ID: Charles BoucheRobert Mcmeekin, male    DOB: July 09, 1960, 54 y.o.   MRN: [id number]  HPI His symptoms began acutely at 6:30 AM 09/23/14 upon awakening and lifting his head up from the pillow as the room "spining & jolting back-and-forth". There was no cardiac or neurologic prodrome prior to the symptoms. [place] and diagnosed with "vertigo" and prescribed meclizine.  He has some pressure in the left ear related to wax retention. This resolved with use of Q-tip.  He's had no  upper respiratory tract infection symptoms or extrinsic symptoms.  BP @ home 114/77-146/90.PMH of gout  Review of Systems  Denied were any change in heart rhythm or rate prior to the event. There was no associated chest pain or shortness of breath . Also specifically denied prior to the episode were headache, limb weakness, tingling, or numbness. No seizure activity noted. Frontal sinus pain, maxillary sinus pain , nasal purulence, dental pain, sore throat , otic pain or otic discharge denied. No fever , chills or sweats. Extrinsic symptoms of itchy, watery eyes, sneezing, or angioedema are denied. There is no significant cough, sputum production, wheezing,or  paroxysmal nocturnal dyspnea.    Objective:   Physical Exam  Pertinent or positive findings include:  He exhibits erythema of the nasal septal mucosa, right greater than left.  He has no significant cerumen.  Ear exam was negative to include whisper and tuning fork exam.  General appearance :adequately nourished; in no distress. Eyes: No conjunctival inflammation or scleral icterus is present.EOM & FOV intact. Oral exam:  Lips and gums are healthy appearing.There is no oropharyngeal erythema or exudate noted. Dental hygiene is good. Heart:  Normal rate and regular rhythm. S1 and S2 normal without gallop, murmur, click, rub or other extra sounds   Lungs:Chest clear to auscultation; no wheezes,  rhonchi,rales ,or rubs present.No increased work of breathing.  Abdomen: bowel sounds normal, soft and non-tender without masses, organomegaly or hernias noted.  No guarding or rebound. Vascular : all pulses equal ; no bruits present. Skin:Warm & dry.  Intact without suspicious lesions or rashes ; no tenting or jaundice  Lymphatic: No lymphadenopathy is noted about the head, neck, axilla  Neurologic exam : Cn 2-7 intact Strength equal & normal in upper & lower extremities Able to walk on heels and toes.   Balance normal  Romberg normal, finger to nose normal.  Testing completed for benign positional vertigo including placing him in supine position as his head was rotated laterally passively while he stared straight ahead. This revealed no nystagmus         Assessment & Plan:  #1 benign positional vertigo (BPV) . #2  HTN Plan: Meclizine 25 mg 1/2-1 tid prn Physical Therapy evaluation if no better with head position exercises See AVS. HCTZ not option with gout history

## 2014-09-26 NOTE — Progress Notes (Signed)
Pre visit review using our clinic review tool, if applicable. No additional management support is needed unless otherwise documented below in the visit note. 

## 2014-09-26 NOTE — Patient Instructions (Addendum)
Go to Web M.D. for information on benign positional vertigo (BPV) . Physical therapy exercises can treat that.  Plain Mucinex (NOT D) for thick secretions ;force NON dairy fluids .   Nasal cleansing in the shower as discussed with lather of mild shampoo.After 10 seconds wash off lather while  exhaling through nostrils. Make sure that all residual soap is removed to prevent irritation.  Flonase OR Nasacort AQ 1 spray in each nostril twice a day as needed. Use the "crossover" technique into opposite nostril spraying toward opposite ear @ 45 degree angle, not straight up into nostril.  Plain Allegra (NOT D )  160 daily , Loratidine 10 mg , OR Zyrtec 10 mg @ bedtime  as needed for itchy eyes & sneezing.  Minimal Blood Pressure Goal= AVERAGE < 140/90;  Ideal is an AVERAGE < 135/85. This AVERAGE should be calculated from @ least 5-7 BP readings taken @ different times of day on different days of week. You should not respond to isolated BP readings , but rather the AVERAGE for that week .Please bring your  blood pressure cuff to office visits to verify that it is reliable.It  can also be checked against the blood pressure device at the pharmacy. Finger or wrist cuffs are not dependable; an arm cuff is.Fill the  prescription for the BP medication if BP NOT @ goal based on  7 to 14 day average.

## 2014-10-05 ENCOUNTER — Encounter: Payer: Self-pay | Admitting: Family Medicine

## 2014-10-05 ENCOUNTER — Ambulatory Visit (INDEPENDENT_AMBULATORY_CARE_PROVIDER_SITE_OTHER): Payer: 59 | Admitting: Family Medicine

## 2014-10-05 ENCOUNTER — Other Ambulatory Visit (INDEPENDENT_AMBULATORY_CARE_PROVIDER_SITE_OTHER): Payer: PRIVATE HEALTH INSURANCE

## 2014-10-05 VITALS — BP 122/86 | HR 68 | Wt 230.0 lb

## 2014-10-05 DIAGNOSIS — M7661 Achilles tendinitis, right leg: Secondary | ICD-10-CM

## 2014-10-05 DIAGNOSIS — S86002A Unspecified injury of left Achilles tendon, initial encounter: Secondary | ICD-10-CM | POA: Diagnosis not present

## 2014-10-05 DIAGNOSIS — M6788 Other specified disorders of synovium and tendon, other site: Secondary | ICD-10-CM | POA: Insufficient documentation

## 2014-10-05 MED ORDER — DICLOFENAC SODIUM 2 % TD SOLN: TRANSDERMAL | Status: DC

## 2014-10-05 NOTE — Patient Instructions (Signed)
Good to see you Charles [name] 20 minutes 2 times daily. Usually after activity and before bed. Exercises 3 times a week.  Heel lift in left shoe at all times Consider calf compression sleeve pennsaid pinkie amount topically 2 times daily as needed.  See me again in 3 weeks and we will see how you are doing.

## 2014-10-05 NOTE — Assessment & Plan Note (Signed)
Patient states that overall patient has been doing relatively well but it the pain is stopping him from increasing his activity. I do believe that this is more just tightness of the posterior cord. We discussed icing protocol, home exercises, heel lift as well as topical anti-inflammatories. Prescription was given. Patient will come back and see me again in 3 weeks for further evaluation. Due to patient having coronary artery vessel disease he is not a candidate for nitroglycerin patches.

## 2014-10-05 NOTE — Progress Notes (Signed)
Tawana ScaleZach Smith D.O. [facility] [address] [place], [place] [address] Phone: [phone] Subjective:    I'm seeing this patient by the request  of:  Marga MelnickWilliam Hopper, MD   CC: Left ankle pain  DGU:[id number]:Subjective Charles BoucheRobert [name] is a 54 y.o. male coming in with complaint of left ankle pain.  Patient is had this pain for multiple months. Patient does not report never any injury in the recent time. Patient describes the pain as more of a tightness. Patient states that if he walks for long distances erythema seizure he can have severe pain. Patient denies any swelling or redness in the area. Patient states that he can get sometimes stiffness in the medial aspect of his calf. Patient denies any shortness of breath, rates the severity of pain a 7 out of 10. States that it is not affecting his daily activities but can be limiting with increasing activity. Denies any nighttime awakening.    Past Medical History  Diagnosis Date  . Gout     per pt stable is of 08-18-2014  . Hyperlipidemia LDL goal <100   . Hypertension   . Psoriasis   . CAD (coronary artery disease) 04/2008    successful PCI of the lesion in the proximal LAD using xience drug-eluting stent with improvement in central narrowing from 95% to 0%.  Dr. Juanda ChanceBrodie  . S/P drug eluting coronary stent placement     05-13-2008   DES X1 TO pLAD  . Sigmoid diverticulosis   . BPH (benign prostatic hypertrophy) with urinary retention   . History of sepsis     urosepsis--  08-13-2014 admission  . Multilevel degenerative disc disease     cervical and lumbar  . History of acute bronchitis     recently 07-16-2014  . Foley catheter in place   . At risk for sleep apnea     STOP-BANG= 5      SENT TO PCP 08-18-2014     Past medical history, social, surgical and family history all reviewed in electronic medical record.   Review of Systems: No headache, visual changes, nausea, vomiting, diarrhea, constipation, dizziness, abdominal pain,  skin rash, fevers, chills, night sweats, weight loss, swollen lymph nodes, body aches, joint swelling, muscle aches, chest pain, shortness of breath, mood changes.   Objective Blood pressure 122/86, pulse 68, weight 230 lb (104.327 kg), SpO2 93 %.  General: No apparent distress alert and oriented x3 mood and affect normal, dressed appropriately.  HEENT: Pupils equal, extraocular movements intact  Respiratory: Patient's speak in full sentences and does not appear short of breath  Cardiovascular: No lower extremity edema, non tender, no erythema  Skin: Warm dry intact with no signs of infection or rash on extremities or on axial skeleton.  Abdomen: Soft nontender  Neuro: Cranial nerves II through XII are intact, neurovascularly intact in all extremities with 2+ DTRs and 2+ pulses.  Lymph: No lymphadenopathy of posterior or anterior cervical chain or axillae bilaterally.  Gait normal with good balance and coordination.  MSK:  Non tender with full range of motion and good stability and symmetric strength and tone of shoulders, elbows, wrist, hip, knee bilaterally.   Ankle: Left No visible erythema or swelling. Range of motion is full in all directions. Strength is 5/5 in all directions. Stable lateral and medial ligaments; squeeze test and kleiger test unremarkable; Talar dome nontender; Achilles tendon is moderately to palpation but no erythema noted. No pain at base of 5th MT; No tenderness over  cuboid; No tenderness over N spot or navicular prominence No tenderness on posterior aspects of lateral and medial malleolus No sign of peroneal tendon subluxations or tenderness to palpation Negative tarsal tunnel tinel's Able to walk 4 steps.  MSK US performed of: Left ankle This study was ordered, performed, and interpreted by Terrilee FilesZach Smith D.O.  Foot/Ankle:   All structures visualized.   Talar dome unremarkable  Ankle mortise without effusion. Peroneus longus and brevis tendons unremarkable  on long and transverse views without sheath effusions. Posterior tibialis, flexor hallucis longus, and flexor digitorum longus tendons unremarkable on long and transverse views without sheath effusions. Achilles tendon  mild hypoechoic changes and tendon sheath nodule noted 4 cm. patient also has some mild retrocalcaneal bursitis noted. No tearing appreciated. Anterior Talofibular Ligament and Calcaneofibular Ligaments unremarkable and intact. Deltoid Ligament unremarkable and intact. Plantar fascia intact and without effusion, normal thickness. No increased doppler signal, cap sign, or thickening of tibial cortex. Power doppler signal normal.  IMPRESSION:  Achilles tendinopathy  Procedure note 97110; 15 minutes spent for Therapeutic exercises as stated in above notes.  This included exercises focusing on stretching, strengthening, with significant focus on eccentric aspects.  Ankle strengthening that included:  Basic range of motion exercises to allow proper full motion at ankle Stretching of the lower leg and hamstrings  Theraband exercises for the lower leg - inversion, eversion, dorsiflexion and plantarflexion each to be completed with a theraband Balance exercises to increase proprioception Weight bearing exercises to increase strength and balance Proper technique shown and discussed handout in great detail with ATC.  All questions were discussed and answered.      Impression and Recommendations:     This case required medical decision making of moderate complexity.

## 2014-10-05 NOTE — Progress Notes (Signed)
Pre visit review using our clinic review tool, if applicable. No additional management support is needed unless otherwise documented below in the visit note. 

## 2014-10-12 ENCOUNTER — Encounter: Payer: Self-pay | Admitting: Internal Medicine

## 2014-10-14 ENCOUNTER — Encounter: Payer: Self-pay | Admitting: Medical

## 2014-10-14 ENCOUNTER — Ambulatory Visit (INDEPENDENT_AMBULATORY_CARE_PROVIDER_SITE_OTHER): Payer: PRIVATE HEALTH INSURANCE | Admitting: Medical

## 2014-10-14 VITALS — BP 130/89 | HR 69 | Temp 98.7°F | Wt 233.8 lb

## 2014-10-14 DIAGNOSIS — T7840XA Allergy, unspecified, initial encounter: Secondary | ICD-10-CM | POA: Insufficient documentation

## 2014-10-14 DIAGNOSIS — R21 Rash and other nonspecific skin eruption: Secondary | ICD-10-CM | POA: Diagnosis not present

## 2014-10-14 MED ORDER — PREDNISONE 20 MG PO TABS: ORAL_TABLET | ORAL | Status: DC

## 2014-10-14 MED ORDER — METHYLPREDNISOLONE ACETATE 40 MG/ML IJ SUSP
40.0000 mg | Freq: Once | INTRAMUSCULAR | Status: AC
  Administered 2014-10-14: 40 mg via INTRAMUSCULAR

## 2014-10-14 NOTE — Progress Notes (Signed)
Subjective:    Patient ID: Charles [name], male    DOB: 08-10-1960, 54 y.o.   MRN: [id number]  HPI   Pt in with rash on lower legs. He was weed eating and got exposure to his legs. Then rash came on and has itched. So he has small red areas that itch a lot. Won't resolve. Using some calamine lotion. This is present for 10 days. Some of these area may have poison ivy or oak.   Review of Systems  Respiratory: Negative for apnea, cough, shortness of breath and wheezing.   Cardiovascular: Negative for chest pain and palpitations.  Musculoskeletal: Negative for back pain.  Skin: Positive for rash.       Itches.  Hematological: Negative for adenopathy. Does not bruise/bleed easily.   Past Medical History  Diagnosis Date  . Gout     per pt stable is of 08-18-2014  . Hyperlipidemia LDL goal <100   . Hypertension   . Psoriasis   . CAD (coronary artery disease) 04/2008    successful PCI of the lesion in the proximal LAD using xience drug-eluting stent with improvement in central narrowing from 95% to 0%.  Dr. Juanda Chance  . S/P drug eluting coronary stent placement     05-13-2008   DES X1 TO pLAD  . Sigmoid diverticulosis   . BPH (benign prostatic hypertrophy) with urinary retention   . History of sepsis     urosepsis--  08-13-2014 admission  . Multilevel degenerative disc disease     cervical and lumbar  . History of acute bronchitis     recently 07-16-2014  . Foley catheter in place   . At risk for sleep apnea     STOP-BANG= 5      SENT TO PCP 08-18-2014    History   Social History  . Marital Status: Married    Spouse Name: [name]  . Number of Children: 1  . Years of Education: Bachelor   Occupational History  . Field Optician, dispensing    Social History Main Topics  . Smoking status: Former Smoker -- 0.75 packs/day for 14 years    Types: Cigarettes    Quit date: 05/27/1997  . Smokeless tobacco: Former Neurosurgeon    Types: Snuff    Quit date: 08/17/1997  . Alcohol Use: 1.2  oz/week    2 Cans of beer per week     Comment: occasional  . Drug Use: No  . Sexual Activity: Not on file   Other Topics Concern  . Not on file   Social History Narrative   Patient is married to [name]) has 1 child   Patient is right handed   Education level is Bachelor's degree   Caffeine consumption is 1 daily    Past Surgical History  Procedure Laterality Date  . Bunionectomy Right 2000  . Colonoscopy with propofol  last one 10-06-2013  . Wrist ganglion excision Left 2001  . Coronary angioplasty with stent placement  05-13-2008    Dr Charlies Constable    PCI w/  DES x1 to  pLAD/  ef 65%  . Green light laser turp (transurethral resection of prostate N/A 08/23/2014    Procedure: GREEN LIGHT LASER TURP (TRANSURETHRAL RESECTION OF PROSTATE;  Surgeon: Bjorn Pippin, MD;  Location: [facility];  Service: Urology;  Laterality: N/A;    Family History  Problem Relation Age of Onset  . Hypertension Father   . Stroke Father 7  . Lung cancer Mother  smoker  . Diabetes Sister   . Heart disease Brother 49     stent LAD 2012  . Colon cancer Neg Hx   . Pancreatic cancer Neg Hx   . Rectal cancer Neg Hx   . Stomach cancer Neg Hx     Allergies  Allergen Reactions  . Lidocaine Anaphylaxis    REACTION: anaphylactic shock (also he was on Neomycin sulfate)  . Neomycin Sulfate Anaphylaxis    REACTION: anaphylactic shock (also on Lidocaine  topically)  . Sulfa Drugs Cross Reactors Anaphylaxis  . Flexeril [Cyclobenzaprine] Other (See Comments)    05/26/13 urinary retention  . Hctz [Hydrochlorothiazide] Other (See Comments)    Gout   . Tramadol Other (See Comments)    05/26/13 urinary retention  . Atrovent [Ipratropium] Other (See Comments)    Caused Urinary retention (this is the nasal spray)  . Latex Itching and Rash    Skin irritation    Current Outpatient Prescriptions on File Prior to Visit  Medication Sig Dispense Refill  . allopurinol (ZYLOPRIM) 300 MG tablet  TAKE 1 TABLET BY MOUTH EVERY DAY (Patient taking differently: Take 300 mg by mouth every morning. TAKE 1 TABLET BY MOUTH EVERY DAY) 90 tablet 1  . aspirin 325 MG EC tablet Take 325 mg by mouth daily.    . Aspirin-Acetaminophen-Caffeine (EXCEDRIN PO) Take 2 tablets by mouth daily as needed (headache).    . Carboxymethylcellul-Glycerin 0.5-0.9 % SOLN Place 2 drops into both eyes 2 (two) times daily.    . cetirizine (ZYRTEC) 10 MG tablet Take 10 mg by mouth at bedtime.    . clobetasol cream (TEMOVATE) 0.05 % Apply 1 application topically 2 (two) times daily. Apply 1 application topically 2 (two) times daily as needed 300 g 3  . Coenzyme Q10 (CO Q 10 PO) Take 1 capsule by mouth every morning. 500mg     . CRESTOR 10 MG tablet TAKE 1 TABLET DAILY 90 tablet 1  . Diclofenac Sodium 2 % SOLN Apply 1 pump twice daily. 112 g 3  . fluocinonide (LIDEX) 0.05 % external solution Apply 1 application topically 2 (two) times daily. As needed (Patient taking differently: Apply 1 application topically at bedtime as needed (on scalp for psoriaris). As needed) 360 mL 3  . FLUoxetine (PROZAC) 20 MG capsule TAKE 1 CAPSULE DAILY (Patient taking differently: TAKE 1 CAPSULE DAILY---  takes in am) 30 capsule 1  . FLUoxetine (PROZAC) 20 MG capsule TAKE 1 CAPSULE DAILY 90 capsule 0  . ibuprofen (ADVIL,MOTRIN) 200 MG tablet Take 200 mg by mouth every 6 (six) hours as needed. 2 tabs=400mg  per dose for muscle aches    . meclizine (ANTIVERT) 25 MG tablet Take 1 tablet (25 mg total) by mouth 4 (four) times daily. 28 tablet 0  . metoprolol succinate (TOPROL-XL) 100 MG 24 hr tablet Take 1 tablet (100 mg total) by mouth daily. (Patient taking differently: Take 100 mg by mouth every morning. ) 90 tablet 1  . Multiple Vitamin (MULTIVITAMIN) capsule Take 1 capsule by mouth every morning.     . nitroGLYCERIN (NITROSTAT) 0.3 MG SL tablet Place 1 tablet (0.3 mg total) under the tongue every 5 (five) minutes as needed. 1 prn , can repeat in 5  minutes then ER 90 tablet 0  . Omega-3 Fatty Acids (FISH OIL) 1000 MG CAPS Take 1,000 mg by mouth every morning.     Marland Kitchen. spironolactone (ALDACTONE) 25 MG tablet Take 1 tablet (25 mg total) by mouth daily. 30 tablet 2  No current facility-administered medications on file prior to visit.    BP 130/89 mmHg  Pulse 69  Temp(Src) 98.7 F (37.1 C) (Oral)  Wt 233 lb 12.8 oz (106.051 kg)  SpO2 97%       Objective:   Physical Exam  General- No acute distress. Pleasant patient. Neck- Full range of motion, no jvd Lungs- Clear, even and unlabored. Heart- regular rate and rhythm. Neurologic- CNII- XII grossly intact. skin- scattered papular rash on both pretibial area. Lt medial tibial tuberosity rash area worst.      Assessment & Plan:

## 2014-10-14 NOTE — Patient Instructions (Signed)
Allergic reaction To weed exposure. Maybe poison ivy.  Depomedrol 40 mg im.  Prednisone 20 mg #15 1 tab po tid x 5 days.    Follow up in 7 days or as needed

## 2014-10-14 NOTE — Assessment & Plan Note (Signed)
To weed exposure. Maybe poison ivy.  Depomedrol 40 mg im.  Prednisone 20 mg #15 1 tab po tid x 5 days.

## 2014-10-14 NOTE — Progress Notes (Signed)
Pre visit review using our clinic review tool, if applicable. No additional management support is needed unless otherwise documented below in the visit note. 

## 2014-10-26 ENCOUNTER — Ambulatory Visit (INDEPENDENT_AMBULATORY_CARE_PROVIDER_SITE_OTHER): Payer: PRIVATE HEALTH INSURANCE | Admitting: Family Medicine

## 2014-10-26 ENCOUNTER — Encounter: Payer: Self-pay | Admitting: Family Medicine

## 2014-10-26 VITALS — BP 120/84 | HR 75 | Ht 68.0 in | Wt 229.0 lb

## 2014-10-26 DIAGNOSIS — M7661 Achilles tendinitis, right leg: Secondary | ICD-10-CM

## 2014-10-26 DIAGNOSIS — M216X9 Other acquired deformities of unspecified foot: Secondary | ICD-10-CM | POA: Diagnosis not present

## 2014-10-26 NOTE — Progress Notes (Signed)
[name] [address] [place], [place] [address] Phone: [phone] Subjective:    I'm seeing this patient by the request  of:  Marga Melnick, MD   CC: Left ankle pain follow-up  [id number] Charles [name] is a 54 y.o. male coming in with complaint of left ankle pain.  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 Patient did not have any tearing sensation noted. Patient does have a history of gout but no uric acid crystallization was appreciated. Patient has been doing the home exercises, icing protocol, as well as wearing proper shoes and heel lift. Patient states that he is approximately 80% better. Patient states that he has started to increase his activity as well and not noticing any significant pain. Patient is fairly happy with the results. She is going out of town to [place] and once to make sure that he can walk regularly.    Past Medical History  Diagnosis Date  . Gout     per pt stable is of 08-18-2014  . Hyperlipidemia LDL goal <100   . Hypertension   . Psoriasis   . CAD (coronary artery disease) 04/2008    successful PCI of the lesion in the proximal LAD using xience drug-eluting stent with improvement in central narrowing from 95% to 0%.  Dr. Juanda Chance  . S/P drug eluting coronary stent placement     05-13-2008   DES X1 TO pLAD  . Sigmoid diverticulosis   . BPH (benign prostatic hypertrophy) with urinary retention   . History of sepsis     urosepsis--  08-13-2014 admission  . Multilevel degenerative disc disease     cervical and lumbar  . History of acute bronchitis     recently 07-16-2014  . Foley catheter in place   . At risk for sleep apnea     STOP-BANG= 5      SENT TO PCP 08-18-2014     Past medical history, social, surgical and family history all reviewed in electronic medical record.   Review of Systems: No headache, visual changes, nausea, vomiting, diarrhea, constipation, dizziness, abdominal pain, skin rash,  fevers, chills, night sweats, weight loss, swollen lymph nodes, body aches, joint swelling, muscle aches, chest pain, shortness of breath, mood changes.   Objective Blood pressure 120/84, pulse 75, height  (1.727 m), weight 229 lb (103.874 kg), SpO2 95 %.  General: No apparent distress alert and oriented x3 mood and affect normal, dressed appropriately.  HEENT: Pupils equal, extraocular movements intact  Respiratory: Patient's speak in full sentences and does not appear short of breath  Cardiovascular: No lower extremity edema, non tender, no erythema  Skin: Warm dry intact with no signs of infection or rash on extremities or on axial skeleton.  Abdomen: Soft nontender  Neuro: Cranial nerves II through XII are intact, neurovascularly intact in all extremities with 2+ DTRs and 2+ pulses.  Lymph: No lymphadenopathy of posterior or anterior cervical chain or axillae bilaterally.  Gait normal with good balance and coordination.  MSK:  Non tender with full range of motion and good stability and symmetric strength and tone of shoulders, elbows, wrist, hip, knee bilaterally.   Ankle: Left No visible erythema or swelling. Range of motion is full in all directions. Strength is 5/5 in all directions. Stable lateral and medial ligaments; squeeze test and kleiger test unremarkable; Talar dome nontender; Achilles tendon nontender on exam. No pain at base of 5th MT; No tenderness over cuboid; No tenderness over  N spot or navicular prominence No tenderness on posterior aspects of lateral and medial malleolus No sign of peroneal tendon subluxations or tenderness to palpation Negative tarsal tunnel tinel's Able to walk 4 steps. Contralateral ankle unremarkable  Foot exam shows the patient does have a significant over pronation of the hindfoot bilaterally with loss of the longitudinal and transverse arch.    Impression and Recommendations:     This case required medical decision making of  moderate complexity.

## 2014-10-26 NOTE — Assessment & Plan Note (Signed)
She is doing significantly better now with the home exercises. We discussed continuing to monitor for any type of flares. Patient will actually start to progress and patient was given an exercise prescription with more weight specific training. Patient also is going to more of a running progression and was given information. We discussed icing after activity. Patient and will follow-up with me again in 4-6 weeks for further evaluation and treatment.  Spent  25 minutes with patient face-to-face and had greater than 50% of counseling including as described above in assessment and plan.

## 2014-10-26 NOTE — Patient Instructions (Signed)
Good to see you and good work Continue the heel lift for now [place] [employer]! Continue the exercises and add a little weight to the step/bowflex routine Ice after walking [place] [employer]  Start a walk-run progression: - Initially start one minute walking than one minute running for 20 mins in the first week,   then 25 mins during the second week, then 30 mins afterwards.  Once you have reached 30 mins: - Run 2 mins, then walk 1 min. -Then run 3 mins, and walk 1 min. -Then run 4 mins, and walk 1 min. -Then run 5 mins, and walk 1 min. -Slowly build up weekly to running 30 mins nonstop.  If painful at any of the steps, back up one step.  See you again in 4 weeks to make sure you are perfect and decide on the orthotics.

## 2014-10-26 NOTE — Progress Notes (Signed)
Pre visit review using our clinic review tool, if applicable. No additional management support is needed unless otherwise documented below in the visit note. 

## 2014-11-09 ENCOUNTER — Encounter: Payer: Self-pay | Admitting: Pulmonary Disease

## 2014-11-09 ENCOUNTER — Ambulatory Visit (INDEPENDENT_AMBULATORY_CARE_PROVIDER_SITE_OTHER): Payer: PRIVATE HEALTH INSURANCE | Admitting: Pulmonary Disease

## 2014-11-09 VITALS — BP 132/98 | HR 73 | Ht 68.0 in | Wt 235.0 lb

## 2014-11-09 DIAGNOSIS — G4733 Obstructive sleep apnea (adult) (pediatric): Secondary | ICD-10-CM

## 2014-11-09 NOTE — Progress Notes (Deleted)
   Subjective:    Patient ID: Charles [name], male    DOB: 05/17/1961, 54 y.o.   MRN: [id number]  HPI    Review of Systems  Constitutional: Positive for fatigue. Negative for fever and unexpected weight change.  HENT: Positive for sinus pressure. Negative for congestion, dental problem, ear pain, nosebleeds, postnasal drip, rhinorrhea, sneezing, sore throat and trouble swallowing.        Allergies  Eyes: Negative for redness and itching.       Eye pressure related to sinuses  Respiratory: Negative for cough, chest tightness, shortness of breath and wheezing.   Cardiovascular: Negative for palpitations and leg swelling.  Gastrointestinal: Negative for nausea and vomiting.  Genitourinary: Negative for dysuria.  Musculoskeletal: Negative for joint swelling.  Skin: Negative for rash.  Neurological: Negative for headaches.  Hematological: Does not bruise/bleed easily.  Psychiatric/Behavioral: Negative for dysphoric mood. The patient is not nervous/anxious.        Objective:   Physical Exam        Assessment & Plan:

## 2014-11-09 NOTE — Patient Instructions (Signed)
Will arrange for home sleep study Will call to arrange for follow up after sleep study reviewed  

## 2014-11-09 NOTE — Progress Notes (Signed)
Chief Complaint  Patient presents with  . SLEEP CONSULT    Referred by Dr Alwyn Ren for excessive snoring. Epworth Score: 14    History of Present Illness: Charles [name] is a 54 y.o. male for evaluation of sleep problems.  His wife has been concerned about his snoring.  She has to wear ear plugs.  She also says he stops breathing while asleep.  He was in hospital with sepsis from UTI and had STOP BANG that was positive.  He goes to sleep at 11 pm.  He falls asleep quickly.  He wakes up 2 or 3 times to use the bathroom.  He gets out of bed at 7 am.  He feels tired in the morning.  He denies morning headache.  He does not use anything to help him fall sleep or stay awake.  He takes naps for up to 2 hours in the evening.  He denies sleep walking, sleep talking, bruxism, or nightmares.  There is no history of restless legs.  He denies sleep hallucinations, sleep paralysis, or cataplexy.  The Epworth score is 14 out of 24.  Tests:   Charles [name]  has a past medical history of Gout; Hyperlipidemia LDL goal <100; Hypertension; Psoriasis; CAD (coronary artery disease) (04/2008); S/P drug eluting coronary stent placement; Sigmoid diverticulosis; BPH (benign prostatic hypertrophy) with urinary retention; History of sepsis; Multilevel degenerative disc disease; History of acute bronchitis; Foley catheter in place; and At risk for sleep apnea.  Charles [name]  has past surgical history that includes Bunionectomy (Right, 2000); Colonoscopy with propofol (last one 10-06-2013); Wrist ganglion excision (Left, 2001); Coronary angioplasty with stent (05-13-2008    Dr Charlies Constable); and [place] Si light laser turp (transurethral resection of prostate (N/A, 08/23/2014).  Prior to Admission medications   Medication Sig Start Date End Date Taking? Authorizing Provider  allopurinol (ZYLOPRIM) 300 MG tablet TAKE 1 TABLET BY MOUTH EVERY DAY Patient taking differently: Take 300 mg by mouth every morning. TAKE 1 TABLET  BY MOUTH EVERY DAY 07/04/14   Pecola Lawless, MD  aspirin 325 MG EC tablet Take 325 mg by mouth daily.    Historical Provider, MD  Aspirin-Acetaminophen-Caffeine (EXCEDRIN PO) Take 2 tablets by mouth daily as needed (headache).    Historical Provider, MD  Carboxymethylcellul-Glycerin 0.5-0.9 % SOLN Place 2 drops into both eyes 2 (two) times daily.    Historical Provider, MD  cetirizine (ZYRTEC) 10 MG tablet Take 10 mg by mouth at bedtime.    Historical Provider, MD  clobetasol cream (TEMOVATE) 0.05 % Apply 1 application topically 2 (two) times daily. Apply 1 application topically 2 (two) times daily as needed 11/22/13   Pecola Lawless, MD  Coenzyme Q10 (CO Q 10 PO) Take 1 capsule by mouth every morning.     Historical Provider, MD  CRESTOR 10 MG tablet TAKE 1 TABLET DAILY 09/02/14   Pecola Lawless, MD  Diclofenac Sodium 2 % SOLN Apply 1 pump twice daily. 10/05/14   Judi Saa, DO  fluocinonide (LIDEX) 0.05 % external solution Apply 1 application topically 2 (two) times daily. As needed Patient taking differently: Apply 1 application topically at bedtime as needed (on scalp for psoriaris). As needed 11/22/13   Pecola Lawless, MD  FLUoxetine (PROZAC) 20 MG capsule TAKE 1 CAPSULE DAILY Patient taking differently: TAKE 1 CAPSULE DAILY---  takes in am 03/28/14   Pecola Lawless, MD  FLUoxetine (PROZAC) 20 MG capsule TAKE 1 CAPSULE DAILY 08/29/14   Pecola Lawless,  MD  ibuprofen (ADVIL,MOTRIN) 200 MG tablet Take 200 mg by mouth every 6 (six) hours as needed. 2 tabs=400mg  per dose for muscle aches    Historical Provider, MD  meclizine (ANTIVERT) 25 MG tablet Take 1 tablet (25 mg total) by mouth 4 (four) times daily. 09/25/14   Elson Areas, PA-C  metoprolol succinate (TOPROL-XL) 100 MG 24 hr tablet Take 1 tablet (100 mg total) by mouth daily. Patient taking differently: Take 100 mg by mouth every morning.  07/04/14   Pecola Lawless, MD  Multiple Vitamin (MULTIVITAMIN) capsule Take 1 capsule by  mouth every morning.     Historical Provider, MD  nitroGLYCERIN (NITROSTAT) 0.3 MG SL tablet Place 1 tablet (0.3 mg total) under the tongue every 5 (five) minutes as needed. 1 prn , can repeat in 5 minutes then ER 07/04/14   Pecola Lawless, MD  Omega-3 Fatty Acids (FISH OIL) 1000 MG CAPS Take 1,000 mg by mouth every morning.     Historical Provider, MD  predniSONE (DELTASONE) 20 MG tablet 1 tab po tid x 5 days 10/14/14   Esperanza Richters, PA-C  spironolactone (ALDACTONE) 25 MG tablet Take 1 tablet (25 mg total) by mouth daily. 09/26/14   Pecola Lawless, MD    Allergies  Allergen Reactions  . Lidocaine Anaphylaxis    REACTION: anaphylactic shock (also he was on Neomycin sulfate)  . Neomycin Sulfate Anaphylaxis    REACTION: anaphylactic shock (also on Lidocaine  topically)  . Sulfa Drugs Cross Reactors Anaphylaxis  . Flexeril [Cyclobenzaprine] Other (See Comments)    05/26/13 urinary retention  . Hctz [Hydrochlorothiazide] Other (See Comments)    Gout   . Tramadol Other (See Comments)    05/26/13 urinary retention  . Atrovent [Ipratropium] Other (See Comments)    Caused Urinary retention (this is the nasal spray)  . Latex Itching and Rash    Skin irritation    His family history includes Diabetes in his sister; Heart disease (age of onset: 27) in his brother; Hypertension in his father; Lung cancer in his mother; Stroke (age of onset: 18) in his father. There is no history of Colon cancer, Pancreatic cancer, Rectal cancer, or Stomach cancer.  He  reports that he quit smoking about 17 years ago. His smoking use included Cigarettes. He has a 10.5 pack-year smoking history. He quit smokeless tobacco use about 17 years ago. His smokeless tobacco use included Snuff. He reports that he drinks about 1.2 oz of alcohol per week. He reports that he does not use illicit drugs.  Review of Systems  Constitutional: Positive for fatigue. Negative for fever and unexpected weight change.  HENT: Positive  for sinus pressure. Negative for congestion, dental problem, ear pain, nosebleeds, postnasal drip, rhinorrhea, sneezing, sore throat and trouble swallowing.        Allergies  Eyes: Negative for redness and itching.       Eye pressure related to sinuses  Respiratory: Negative for cough, chest tightness, shortness of breath and wheezing.   Cardiovascular: Negative for palpitations and leg swelling.  Gastrointestinal: Negative for nausea and vomiting.  Genitourinary: Negative for dysuria.  Musculoskeletal: Negative for joint swelling.  Skin: Negative for rash.  Neurological: Negative for headaches.  Hematological: Does not bruise/bleed easily.  Psychiatric/Behavioral: Negative for dysphoric mood. The patient is not nervous/anxious.    Physical Exam: Blood pressure 132/98, pulse 73, height  (1.727 m), weight 235 lb (106.595 kg), SpO2 93 %. Body mass index is 35.74 kg/(m^2).  General - No distress ENT - No sinus tenderness, no oral exudate, no LAN, no thyromegaly, TM clear, pupils equal/reactive, MP 2, decreased AP diameter, low laying soft palate Cardiac - s1s2 regular, no murmur, pulses symmetric Chest - No wheeze/rales/dullness, good air entry, normal respiratory excursion Back - No focal tenderness Abd - Soft, non-tender, no organomegaly, + bowel sounds Ext - No edema Neuro - Normal strength, cranial nerves intact Skin - No rashes Psych - Normal mood, and behavior  Discussion: He has snoring, sleep disruption, witnessed apnea, and daytime sleepiness.  He has hx of CAD, and HTN.  I am concerned he could have sleep apnea.  His BMI is > 35.  We discussed how sleep apnea can affect various health problems including risks for hypertension, cardiovascular disease, and diabetes.  We also discussed how sleep disruption can increase risks for accident, such as while driving.  Weight loss as a means of improving sleep apnea was also reviewed.  Additional treatment options discussed were  CPAP therapy, oral appliance, and surgical intervention.   Assessment/plan:  Obstructive sleep apnea. Plan: - will arrange for home sleep study pending insurance approval  Obesity. Plan: - discussed importance of weight loss  Hypertension. Plan: - he is to follow up with his PCP    Coralyn Helling, M.D. Pager [phone]

## 2014-11-23 ENCOUNTER — Other Ambulatory Visit (INDEPENDENT_AMBULATORY_CARE_PROVIDER_SITE_OTHER): Payer: PRIVATE HEALTH INSURANCE

## 2014-11-23 ENCOUNTER — Telehealth: Payer: Self-pay | Admitting: Internal Medicine

## 2014-11-23 ENCOUNTER — Ambulatory Visit (INDEPENDENT_AMBULATORY_CARE_PROVIDER_SITE_OTHER): Payer: PRIVATE HEALTH INSURANCE | Admitting: Family Medicine

## 2014-11-23 ENCOUNTER — Encounter: Payer: Self-pay | Admitting: *Deleted

## 2014-11-23 ENCOUNTER — Encounter: Payer: Self-pay | Admitting: Family Medicine

## 2014-11-23 VITALS — BP 114/82 | HR 71 | Ht 68.0 in | Wt 236.0 lb

## 2014-11-23 DIAGNOSIS — M7661 Achilles tendinitis, right leg: Secondary | ICD-10-CM

## 2014-11-23 NOTE — Progress Notes (Signed)
Pre visit review using our clinic review tool, if applicable. No additional management support is needed unless otherwise documented below in the visit note. 

## 2014-11-23 NOTE — Assessment & Plan Note (Signed)
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 Patient has a retrocalcaneal bursitis that I think is actually giving him more of the discomfort at this time. We discussed icing regimen, patient was given a new brace which I think be beneficial. Patient will be sized for more custom orthotics that I think will be helpful secondary to his pronation deformity of the ankle. Patient and will come back and see me again in 3-4 weeks after the orthotics. If continuing have pain we'll consider injection in the retrocalcaneal area.

## 2014-11-23 NOTE — Patient Instructions (Signed)
Good to see you Ice 20 minutes 2 times daily. Usually after activity and before bed. Try the brace when doing activity and walking a lot Use the Pennsaid over the softer area on the inside of the ankle Continue the exercises 3x/week We will get you orthotics next Friday Stay active See me agai ni n3 weeks and if not better we will try injection of the bursae.

## 2014-11-23 NOTE — Progress Notes (Signed)
Charles [name] D.O.  [facility] [address] [place], [place] [address] Phone: [phone] Subjective:    I'm seeing this patient by the request  of:  Marga MelnickWilliam Hopper, MD   CC: Left ankle pain follow-up  WNU:[id number]:Subjective Charles [name] is a 54 y.o. male coming in with complaint of left ankle pain.  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 Patient was doing much better but [facility] Patient was doing approximately 20,000 steps a day for 4 days a row. Patient came back and also started a running program. Patient states he has noticed some mild increase in pain. States that it is not severe and not stopping him from daily activities but at the end of a long day he is having more the soreness on the posterior aspect of the heel. States that sometimes has some cramping in the calf muscle as well..    Past Medical History  Diagnosis Date  . Gout     per pt stable is of 08-18-2014  . Hyperlipidemia LDL goal <100   . Hypertension   . Psoriasis   . CAD (coronary artery disease) 04/2008    successful PCI of the lesion in the proximal LAD using xience drug-eluting stent with improvement in central narrowing from 95% to 0%.  Dr. Juanda ChanceBrodie  . S/P drug eluting coronary stent placement     05-13-2008   DES X1 TO pLAD  . Sigmoid diverticulosis   . BPH (benign prostatic hypertrophy) with urinary retention   . History of sepsis     urosepsis--  08-13-2014 admission  . Multilevel degenerative disc disease     cervical and lumbar  . History of acute bronchitis     recently 07-16-2014  . Foley catheter in place   . At risk for sleep apnea     STOP-BANG= 5      SENT TO PCP 08-18-2014     Past medical history, social, surgical and family history all reviewed in electronic medical record.   Review of Systems: No headache, visual changes, nausea, vomiting, diarrhea, constipation, dizziness, abdominal pain, skin rash, fevers, chills, night sweats, weight loss, swollen lymph  nodes, body aches, joint swelling, muscle aches, chest pain, shortness of breath, mood changes.   Objective Blood pressure 114/82, pulse 71, height 5\' 8"  (1.727 m), weight 236 lb (107.049 kg), SpO2 97 %.  General: No apparent distress alert and oriented x3 mood and affect normal, dressed appropriately.  HEENT: Pupils equal, extraocular movements intact  Respiratory: Patient's speak in full sentences and does not appear short of breath  Cardiovascular: No lower extremity edema, non tender, no erythema  Skin: Warm dry intact with no signs of infection or rash on extremities or on axial skeleton.  Abdomen: Soft nontender  Neuro: Cranial nerves II through XII are intact, neurovascularly intact in all extremities with 2+ DTRs and 2+ pulses.  Lymph: No lymphadenopathy of posterior or anterior cervical chain or axillae bilaterally.  Gait normal with good balance and coordination.  MSK:  Non tender with full range of motion and good stability and symmetric strength and tone of shoulders, elbows, wrist, hip, knee bilaterally.   Ankle: Left No visible erythema or swelling. Range of motion is full in all directions. Strength is 5/5 in all directions. Stable lateral and medial ligaments; squeeze test and kleiger test unremarkable; Talar dome nontender; Achilles tendon nontender on exam and mild pain on the calcaneal region. No pain at base of 5th MT; No tenderness over  cuboid; No tenderness over N spot or navicular prominence No tenderness on posterior aspects of lateral and medial malleolus No sign of peroneal tendon subluxations or tenderness to palpation Negative tarsal tunnel tinel's Able to walk 4 steps. Contralateral ankle unremarkable  Foot exam shows the patient does have a significant over pronation of the hindfoot bilaterally with loss of the longitudinal and transverse arch.  MSK US performed of: Left ankle This study was ordered, performed, and interpreted by Terrilee Files  D.O.  Foot/Ankle:   All structures visualized.   Talar dome unremarkable  Ankle mortise without effusion. Peroneus longus and brevis tendons unremarkable on long and transverse views without sheath effusions. Posterior tibialis, flexor hallucis longus, and flexor digitorum longus tendons unremarkable on long and transverse views without sheath effusions. Achilles tendon still has nodule proximal main 3 cm from insertion. No significant hypoechoic changes and no increasing Doppler flow. Retrocalcaneal bursitis still noted and minorly in enlarged from previous exam. Anterior Talofibular Ligament and Calcaneofibular Ligaments unremarkable and intact. Deltoid Ligament unremarkable and intact. Plantar fascia intact and without effusion, normal thickness. No increased doppler signal, cap sign, or thickening of tibial cortex. Power doppler signal normal.  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

## 2014-12-02 ENCOUNTER — Encounter: Payer: Self-pay | Admitting: Family Medicine

## 2014-12-02 ENCOUNTER — Ambulatory Visit (INDEPENDENT_AMBULATORY_CARE_PROVIDER_SITE_OTHER): Payer: PRIVATE HEALTH INSURANCE | Admitting: Family Medicine

## 2014-12-02 DIAGNOSIS — M216X9 Other acquired deformities of unspecified foot: Secondary | ICD-10-CM

## 2014-12-02 NOTE — Patient Instructions (Signed)
Acclimating to your Igli orthotics -  ° °We recommend that you allow up to 2 weeks for you to fully acclimate to your new custom orthotics. Please use the following recommended plan to build into full day wear.  ° °Day 1 - 2hours/day °Every day afterwards add 1 hour of wear(3hrs/day, 4hrs/day, etc) until you are able to wear them for an entire day without issues.  ° °If you notice any irritation or increasing discomfort with your new orthotics, please do not hesitate in contacting the office(leave a message for Ashli Selders or Lindsay) or sending Imanni Burdine a message through MyChart to arrange a time to review your fit.  ° °Enjoy your new orthotics!!  ° °Charles [name] ° ° ° °

## 2014-12-02 NOTE — Assessment & Plan Note (Signed)
She was placed in custom orthotics today. Patient will slowly increase wear over the course of the next 2-4 weeks. Patient learned how to make changes if necessary. Patient come back in 4 weeks for further evaluation and treatment.

## 2014-12-02 NOTE — Progress Notes (Signed)
Patient was fitted for a : standard, cushioned, semi-rigid orthotic. The orthotic was heated and afterward the patient was in a seated position and the orthotic molded. The patient was positioned in subtalar neutral position and 10 degrees of ankle dorsiflexion in a non-weight bearing stance. After completion of molding, patient did have orthotic management which included instructions on acclimating to the orthotics, signs of ill fit as well as care for the orthotic.  I spent approximately with the patient that included fitting the orthotics, making adjustments for fit and ensuring transfer between shoes.   The blank was ground to a stable position for weight bearing. Size: 8.5 (igli Active)  Base: Carbon fiber Additional Posting and Padding: The following postings were fitted onto the molded orthotics to help maintain a talar neutral position - Wedge posting for transverse arch: 250/350 - left  250/50 right due to heel pad      Silicone posting for longitudinal arch: 250/70 - ground down from other posts  The patient ambulated these, and they were very comfortable and supportive.

## 2014-12-13 ENCOUNTER — Other Ambulatory Visit: Payer: Self-pay | Admitting: Emergency Medicine

## 2014-12-13 MED ORDER — ALLOPURINOL 300 MG PO TABS: ORAL_TABLET | ORAL | Status: DC

## 2014-12-13 MED ORDER — FLUOXETINE HCL 20 MG PO CAPS: ORAL_CAPSULE | ORAL | Status: DC

## 2014-12-13 MED ORDER — METOPROLOL SUCCINATE ER 100 MG PO TB24: 100.0000 mg | ORAL_TABLET | Freq: Every day | ORAL | Status: DC

## 2014-12-14 ENCOUNTER — Encounter: Payer: Self-pay | Admitting: Family Medicine

## 2014-12-14 ENCOUNTER — Ambulatory Visit (INDEPENDENT_AMBULATORY_CARE_PROVIDER_SITE_OTHER): Payer: PRIVATE HEALTH INSURANCE | Admitting: Family Medicine

## 2014-12-14 ENCOUNTER — Other Ambulatory Visit (INDEPENDENT_AMBULATORY_CARE_PROVIDER_SITE_OTHER): Payer: PRIVATE HEALTH INSURANCE

## 2014-12-14 VITALS — BP 134/82 | HR 76 | Wt 239.0 lb

## 2014-12-14 DIAGNOSIS — M7752 Other enthesopathy of left foot: Secondary | ICD-10-CM | POA: Diagnosis not present

## 2014-12-14 DIAGNOSIS — M79672 Pain in left foot: Secondary | ICD-10-CM | POA: Diagnosis not present

## 2014-12-14 NOTE — Assessment & Plan Note (Signed)
Patient did have more of a retrocalcaneal  bursitis patient was given an injection today. We discussed icing regimen and home exercises. We discussed slowly increasing his activity over the course the next 3 weeks. Patient and will come back and see me again in 3-4 weeks for further evaluation and treatment.

## 2014-12-14 NOTE — Patient Instructions (Signed)
Good to see you Ice still is your friend Try the pennsaid as needed No exercises for next 10 days, ok to do daily activity In 10 days ok to start exercises again 3 times a week and start running 25% and increase 15% a week See me again in 3 weeks.  Continue the orthotics

## 2014-12-14 NOTE — Progress Notes (Signed)
Tawana ScaleZach Noelani [name] D.O. [facility] [address] [name], [place] [address] Phone: [phone](336) [phone] Subjective:    I'm seeing this patient by the request  of:  Marga MelnickWilliam Hopper, MD   CC: Left ankle pain follow-up  Charles [name]:[id number]:Subjective Charles BoucheRobert [name] is a 54 y.o. male coming in with complaint of left ankle pain.  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 She was also found to have more of a retrocalcaneal bursitis. Patient was doing somewhat better but unfortunately has been traveling a lot more not being able to do the regimen of the exercises on a regular basis. States that unfortunately the pain is increasing again. Starting to affect some of his daily activities. Denies any numbness but just worsening of previous symptoms.    Past Medical History  Diagnosis Date  . Gout     per pt stable is of 08-18-2014  . Hyperlipidemia LDL goal <100   . Hypertension   . Psoriasis   . CAD (coronary artery disease) 04/2008    successful PCI of the lesion in the proximal LAD using xience drug-eluting stent with improvement in central narrowing from 95% to 0%.  Dr. Juanda ChanceBrodie  . S/P drug eluting coronary stent placement     05-13-2008   DES X1 TO pLAD  . Sigmoid diverticulosis   . BPH (benign prostatic hypertrophy) with urinary retention   . History of sepsis     urosepsis--  08-13-2014 admission  . Multilevel degenerative disc disease     cervical and lumbar  . History of acute bronchitis     recently 07-16-2014  . Foley catheter in place   . At risk for sleep apnea     STOP-BANG= 5      SENT TO PCP 08-18-2014     Past medical history, social, surgical and family history all reviewed in electronic medical record.   Review of Systems: No headache, visual changes, nausea, vomiting, diarrhea, constipation, dizziness, abdominal pain, skin rash, fevers, chills, night sweats, weight loss, swollen lymph nodes, body aches, joint swelling, muscle aches, chest pain, shortness of breath, mood changes.     Objective Blood pressure 134/82, pulse 76, weight 239 lb (108.41 kg), SpO2 97 %.  General: No apparent distress alert and oriented x3 mood and affect normal, dressed appropriately.  HEENT: Pupils equal, extraocular movements intact  Respiratory: Patient's speak in full sentences and does not appear short of breath  Cardiovascular: No lower extremity edema, non tender, no erythema  Skin: Warm dry intact with no signs of infection or rash on extremities or on axial skeleton.  Abdomen: Soft nontender  Neuro: Cranial nerves II through XII are intact, neurovascularly intact in all extremities with 2+ DTRs and 2+ pulses.  Lymph: No lymphadenopathy of posterior or anterior cervical chain or axillae bilaterally.  Gait normal with good balance and coordination.  MSK:  Non tender with full range of motion and good stability and symmetric strength and tone of shoulders, elbows, wrist, hip, knee bilaterally.   Ankle: Left No visible erythema or swelling. Range of motion is full in all directions. Strength is 5/5 in all directions. Stable lateral and medial ligaments; squeeze test and kleiger test unremarkable; Talar dome nontender; Achilles tendon nontender on exam and mild pain on the calcaneal region. No pain at base of 5th MT; No tenderness over cuboid; No tenderness over N spot or navicular prominence No tenderness on posterior aspects of lateral and medial malleolus No sign of peroneal tendon subluxations or tenderness to palpation  Negative tarsal tunnel tinel's Able to walk 4 steps. Contralateral ankle unremarkable  Foot exam shows the patient does have a significant over pronation of the hindfoot bilaterally with loss of the longitudinal and transverse arch.  MSK US performed of: Left ankle This study was ordered, performed, and interpreted by Terrilee Files D.O.  Foot/Ankle:   All structures visualized.   Talar dome unremarkable  Ankle mortise without effusion. Peroneus longus and  brevis tendons unremarkable on long and transverse views without sheath effusions. Posterior tibialis, flexor hallucis longus, and flexor digitorum longus tendons unremarkable on long and transverse views without sheath effusions. Achilles tendon still has nodule improved Retrocalcaneal bursa and inflamed Anterior Talofibular Ligament and Calcaneofibular Ligaments unremarkable and intact. Deltoid Ligament unremarkable and intact. Plantar fascia intact and without effusion, normal thickness. No increased doppler signal, cap sign, or thickening of tibial cortex. Power doppler signal normal.  IMPRESSION: Worsening retrocalcaneal bursitis   Procedure: Real-time Ultrasound Guided Injection of left retrocalcaneal bursa Device: GE Logiq E  Ultrasound guided injection is preferred based studies that show increased duration, increased effect, greater accuracy, decreased procedural pain, increased response rate, and decreased cost with ultrasound guided versus blind injection.  Verbal informed consent obtained.  Time-out conducted.  Noted no overlying erythema, induration, or other signs of local infection.  Skin prepped in a sterile fashion.  Local anesthesia: Topical Ethyl chloride.  With sterile technique and under real time ultrasound guidance: 25-gauge 1 inch needle patient was injected with 0.5 mL of 0.5% Marcaine and 0.5 mL of Kenalog 40 mg/dL  Completed without difficulty  Pain immediately resolved suggesting accurate placement of the medication.  Advised to call if fevers/chills, erythema, induration, drainage, or persistent bleeding.  Images permanently stored and available for review in the ultrasound unit.  Impression: Technically successful ultrasound guided injection.   Impression and Recommendations:     This case required medical decision making of moderate complexity.

## 2014-12-18 ENCOUNTER — Encounter: Payer: Self-pay | Admitting: Internal Medicine

## 2014-12-19 MED ORDER — SPIRONOLACTONE 25 MG PO TABS: 25.0000 mg | ORAL_TABLET | Freq: Every day | ORAL | Status: DC

## 2014-12-28 ENCOUNTER — Encounter: Payer: Self-pay | Admitting: Internal Medicine

## 2014-12-28 NOTE — Telephone Encounter (Signed)
Please advise 

## 2015-01-04 ENCOUNTER — Ambulatory Visit (INDEPENDENT_AMBULATORY_CARE_PROVIDER_SITE_OTHER): Payer: PRIVATE HEALTH INSURANCE | Admitting: Family Medicine

## 2015-01-04 ENCOUNTER — Other Ambulatory Visit (INDEPENDENT_AMBULATORY_CARE_PROVIDER_SITE_OTHER): Payer: PRIVATE HEALTH INSURANCE

## 2015-01-04 ENCOUNTER — Encounter: Payer: Self-pay | Admitting: Family Medicine

## 2015-01-04 VITALS — BP 132/82 | HR 64 | Ht 68.0 in | Wt 239.0 lb

## 2015-01-04 DIAGNOSIS — M79672 Pain in left foot: Secondary | ICD-10-CM

## 2015-01-04 DIAGNOSIS — M7752 Other enthesopathy of left foot: Secondary | ICD-10-CM | POA: Diagnosis not present

## 2015-01-04 NOTE — Patient Instructions (Signed)
Nice to see you again Keep the wrap on your ankle area where we did the injection for a bit this morning  Continue to use ice as needed Continue the orthotics  Starting in 48 hours can start more activity and increase 10% a week.  See me again in 2-3 weeks.

## 2015-01-04 NOTE — Progress Notes (Signed)
Charles [name] [address] [place], [place] [address] Phone: [phone] Subjective:    I'm seeing this patient by the request  of:  Marga Melnick, MD   CC: Left ankle pain follow-up  [id number] Charles [name] is a 54 y.o. male coming in with complaint of left ankle pain.  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 He is found to have a worsening retrocalcaneal bursitis. Patient and failed all other conservative therapy and an injection was done. Patient was to decrease his activity as well as continuing conservative therapy including stretching and icing. Patient states he is significantly better. Still has some tightness. Patient is under the perception he should've been pain-free. Patient states that he still feels a tightness but would not consider to significant pain. Patient continues to do some the exercises. Has avoided as much activity as he can. Patient though has been very busy with work and has been traveling more frequently.    Past Medical History  Diagnosis Date  . Gout     per pt stable is of 08-18-2014  . Hyperlipidemia LDL goal <100   . Hypertension   . Psoriasis   . CAD (coronary artery disease) 04/2008    successful PCI of the lesion in the proximal LAD using xience drug-eluting stent with improvement in central narrowing from 95% to 0%.  Dr. Juanda Chance  . S/P drug eluting coronary stent placement     05-13-2008   DES X1 TO pLAD  . Sigmoid diverticulosis   . BPH (benign prostatic hypertrophy) with urinary retention   . History of sepsis     urosepsis--  08-13-2014 admission  . Multilevel degenerative disc disease     cervical and lumbar  . History of acute bronchitis     recently 07-16-2014  . Foley catheter in place   . At risk for sleep apnea     STOP-BANG= 5      SENT TO PCP 08-18-2014     Past medical history, social, surgical and family history all reviewed in electronic medical record.   Review of Systems: No  headache, visual changes, nausea, vomiting, diarrhea, constipation, dizziness, abdominal pain, skin rash, fevers, chills, night sweats, weight loss, swollen lymph nodes, body aches, joint swelling, muscle aches, chest pain, shortness of breath, mood changes.   Objective Blood pressure 132/82, pulse 64, height 5\' 8"  (1.727 m), weight 239 lb (108.41 kg).  General: No apparent distress alert and oriented x3 mood and affect normal, dressed appropriately.  HEENT: Pupils equal, extraocular movements intact  Respiratory: Patient's speak in full sentences and does not appear short of breath  Cardiovascular: No lower extremity edema, non tender, no erythema  Skin: Warm dry intact with no signs of infection or rash on extremities or on axial skeleton.  Abdomen: Soft nontender  Neuro: Cranial nerves II through XII are intact, neurovascularly intact in all extremities with 2+ DTRs and 2+ pulses.  Lymph: No lymphadenopathy of posterior or anterior cervical chain or axillae bilaterally.  Gait normal with good balance and coordination.  MSK:  Non tender with full range of motion and good stability and symmetric strength and tone of shoulders, elbows, wrist, hip, knee bilaterally.   Ankle: Left No visible erythema or swelling. Range of motion is full in all directions. Strength is 5/5 in all directions. Stable lateral and medial ligaments; squeeze test and kleiger test unremarkable; Talar dome nontender; Achilles tendon nontender on exam and no pain but discomfort in the retrocalcaneal  area No pain at base of 5th MT; No tenderness over cuboid; No tenderness over N spot or navicular prominence No tenderness on posterior aspects of lateral and medial malleolus No sign of peroneal tendon subluxations or tenderness to palpation Negative tarsal tunnel tinel's Able to walk 4 steps. Contralateral ankle unremarkable  Foot exam shows the patient does have a significant over pronation of the hindfoot bilaterally  with loss of the longitudinal and transverse arch.  MSK US performed of: Left ankle This study was ordered, performed, and interpreted by Terrilee Files D.O.  Foot/Ankle:   All structures visualized.   Talar dome unremarkable  Ankle mortise without effusion. Peroneus longus and brevis tendons unremarkable on long and transverse views without sheath effusions. Posterior tibialis, flexor hallucis longus, and flexor digitorum longus tendons unremarkable on long and transverse views without sheath effusions. Achilles tendon still has nodule improved Retrocalcaneal bursa noted with significant decrease in size from previous imaging. Significant decrease in hypoechoic changes surrounding the area as well. Anterior Talofibular Ligament and Calcaneofibular Ligaments unremarkable and intact. Deltoid Ligament unremarkable and intact. Plantar fascia intact and without effusion, normal thickness. No increased doppler signal, cap sign, or thickening of tibial cortex. Power doppler signal normal.  IMPRESSION: Improving retrocalcaneal bursitis  Procedure: Real-time Ultrasound Guided Injection of left retrocalcaneal bursa aspiration.  Device: GE Logiq E  Ultrasound guided injection is preferred based studies that show increased duration, increased effect, greater accuracy, decreased procedural pain, increased response rate, and decreased cost with ultrasound guided versus blind injection.  Verbal informed consent obtained.  Time-out conducted.  Noted no overlying erythema, induration, or other signs of local infection.  Skin prepped in a sterile fashion.  Local anesthesia: Topical Ethyl chloride.  With sterile technique and under real time ultrasound guidance: Aspiration of 0.5 mL of fluid from the retrocalcaneal bursa. Completed without difficulty  Advised to call if fevers/chills, erythema, induration, drainage, or persistent bleeding.  Images permanently stored and available for review in the  ultrasound unit.  Impression: Technically successful ultrasound guided injection.     .   Impression and Recommendations:     This case required medical decision making of moderate complexity.

## 2015-01-04 NOTE — Assessment & Plan Note (Signed)
Aspirated today. Patient did have decrease in the amount of discomfort he had initially. Patient hopefully will do much better we will not have read stimulation. We discussed icing regimen and doing a little more activity. Exercise percussion given to him today. Patient will come back and see me again in 4 weeks to make sure that the pain is completely resolved.

## 2015-01-04 NOTE — Progress Notes (Signed)
Pre visit review using our clinic review tool, if applicable. No additional management support is needed unless otherwise documented below in the visit note. 

## 2015-01-23 ENCOUNTER — Telehealth: Payer: Self-pay | Admitting: Pulmonary Disease

## 2015-01-23 NOTE — Telephone Encounter (Signed)
Called and spoke to pt. Pt states he missed Rhonda's call last week to schedule HST. Pt states this is the best number to reach him at: [phone].  Rhonda, please advise. Thanks.

## 2015-01-23 NOTE — Telephone Encounter (Signed)
LMOAM for pt to return my call to r/s HST. Advised patient to contact me or [name] and one of [place] will be happy to r/s his HST. [name] is aware. Rhonda J Cobb

## 2015-01-24 NOTE — Telephone Encounter (Signed)
He is suppose to pick up HST on 01/31/2015 @ 9:00am per our conversation

## 2015-01-25 ENCOUNTER — Ambulatory Visit: Payer: PRIVATE HEALTH INSURANCE | Admitting: Family Medicine

## 2015-01-25 NOTE — Telephone Encounter (Signed)
[name] please advise if this message can be closed. Thanks.

## 2015-01-25 NOTE — Telephone Encounter (Signed)
Yes it can be closed Just waiting to see if he shows up 01/31/2015

## 2015-02-07 DIAGNOSIS — G4733 Obstructive sleep apnea (adult) (pediatric): Secondary | ICD-10-CM | POA: Diagnosis not present

## 2015-02-08 ENCOUNTER — Encounter: Payer: Self-pay | Admitting: Family Medicine

## 2015-02-08 ENCOUNTER — Ambulatory Visit (INDEPENDENT_AMBULATORY_CARE_PROVIDER_SITE_OTHER): Payer: PRIVATE HEALTH INSURANCE | Admitting: Family Medicine

## 2015-02-08 VITALS — BP 130/88 | HR 74 | Ht 68.0 in | Wt 240.0 lb

## 2015-02-08 DIAGNOSIS — M7661 Achilles tendinitis, right leg: Secondary | ICD-10-CM

## 2015-02-08 NOTE — Patient Instructions (Signed)
Good job Give yourself a pat on the back.  Ice still is a good idea Heel lift 1/16 inch or 1/8 inch would help during the day Treadmill 3-4 times a week.   If adding a day then decrease time for 2 weeks and work your way up again Pennsaid when you need it.  Continue my exercises 2-3 times a week if you can.  See me again in 6 weeks.

## 2015-02-08 NOTE — Assessment & Plan Note (Signed)
Patient is doing significantly better at this time. We discussed icing regimen and home exercise. We discussed which activities to do and which ones to potentially avoid.patient does well he can follow-up as needed otherwise we will see him again in 6 weeks for further evaluation.

## 2015-02-08 NOTE — Progress Notes (Signed)
[name] [address] [place], [place] [address] Phone: [phone] Subjective:    I'm seeing this patient by the request  of:  Marga Melnick, MD   CC: Left ankle pain follow-up  [id number] Heriberto Stmartin is a 54 y.o. male coming in with complaint of left ankle pain.  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 He is found to have a worsening retrocalcaneal bursitis. Patient was not responding significant a well to conservative therapy and elected to try an injection. Patient has been taking anything since then increasing range of motion and icing protocol. Patient states since aspiration by weeks ago he has been doing significantly better. Patient is increase his activity and is now on a treadmill 3 times a week. Patient has noticed that he is been doing significant a better overall with some mild tightness. Patient states that there is some soreness sometimes but no significant pain. Continues doing the topical anti-inflammatory intermittently as well as the icing intermittently.    Past Medical History  Diagnosis Date  . Gout     per pt stable is of 08-18-2014  . Hyperlipidemia LDL goal <100   . Hypertension   . Psoriasis   . CAD (coronary artery disease) 04/2008    successful PCI of the lesion in the proximal LAD using xience drug-eluting stent with improvement in central narrowing from 95% to 0%.  Dr. Juanda Chance  . S/P drug eluting coronary stent placement     05-13-2008   DES X1 TO pLAD  . Sigmoid diverticulosis   . BPH (benign prostatic hypertrophy) with urinary retention   . History of sepsis     urosepsis--  08-13-2014 admission  . Multilevel degenerative disc disease     cervical and lumbar  . History of acute bronchitis     recently 07-16-2014  . Foley catheter in place   . At risk for sleep apnea     STOP-BANG= 5      SENT TO PCP 08-18-2014     Past medical history, social, surgical and family history all reviewed in  electronic medical record.   Review of Systems: No headache, visual changes, nausea, vomiting, diarrhea, constipation, dizziness, abdominal pain, skin rash, fevers, chills, night sweats, weight loss, swollen lymph nodes, body aches, joint swelling, muscle aches, chest pain, shortness of breath, mood changes.   Objective Blood pressure 130/88, pulse 74, height  (1.727 m), weight 240 lb (108.863 kg), SpO2 94 %.  General: No apparent distress alert and oriented x3 mood and affect normal, dressed appropriately.  HEENT: Pupils equal, extraocular movements intact  Respiratory: Patient's speak in full sentences and does not appear short of breath  Cardiovascular: No lower extremity edema, non tender, no erythema  Skin: Warm dry intact with no signs of infection or rash on extremities or on axial skeleton.  Abdomen: Soft nontender  Neuro: Cranial nerves II through XII are intact, neurovascularly intact in all extremities with 2+ DTRs and 2+ pulses.  Lymph: No lymphadenopathy of posterior or anterior cervical chain or axillae bilaterally.  Gait normal with good balance and coordination.  MSK:  Non tender with full range of motion and good stability and symmetric strength and tone of shoulders, elbows, wrist, hip, knee bilaterally.   Ankle: Left No visible erythema or swelling. Range of motion is full in all directions. Strength is 5/5 in all directions. Stable lateral and medial ligaments; squeeze test and kleiger test unremarkable; Talar dome nontender; Achilles tendon nontender  on exam and no pain but discomfort in the retrocalcaneal area No pain at base of 5th MT; No tenderness over cuboid; No tenderness over N spot or navicular prominence No tenderness on posterior aspects of lateral and medial malleolus No sign of peroneal tendon subluxations or tenderness to palpation Negative tarsal tunnel tinel's Able to walk 4 steps. Contralateral ankle unremarkable  Foot exam shows the patient  does have a significant over pronation of the hindfoot bilaterally with loss of the longitudinal and transverse arch.  MSK US performed of: Left ankle This study was ordered, performed, and interpreted by Terrilee Files D.O.  Foot/Ankle:   All structures visualized.   Talar dome unremarkable  Ankle mortise without effusion. Peroneus longus and brevis tendons unremarkable on long and transverse views without sheath effusions. Posterior tibialis, flexor hallucis longus, and flexor digitorum longus tendons unremarkable on long and transverse views without sheath effusions. Achilles tendonnormal Retrocalcaneal bursa 90% smaller than previous. Still mild hypoechoic changes in this area. Anterior Talofibular Ligament and Calcaneofibular Ligaments unremarkable and intact. Deltoid Ligament unremarkable and intact. Plantar fascia intact and without effusion, normal thickness. No increased doppler signal, cap sign, or thickening of tibial cortex. Power doppler signal normal.  IMPRESSION: minimal remaining retrocalcaneal bursitis noted.      .   Impression and Recommendations:     This case required medical decision making of moderate complexity.

## 2015-02-08 NOTE — Progress Notes (Signed)
Pre visit review using our clinic review tool, if applicable. No additional management support is needed unless otherwise documented below in the visit note. 

## 2015-02-15 ENCOUNTER — Telehealth: Payer: Self-pay | Admitting: Pulmonary Disease

## 2015-02-15 ENCOUNTER — Encounter: Payer: Self-pay | Admitting: Pulmonary Disease

## 2015-02-15 NOTE — Telephone Encounter (Signed)
HST 02/07/15 >> AHI 53.2, SaO2 low 75%.  Will have my nurse inform pt that sleep study shows severe sleep apnea.  Options are 1) CPAP now, 2) ROV first.  If He is agreeable to CPAP, then please send order for auto CPAP range 5 to 15 cm H2O with heated humidity and mask of choice.  Have download sent 1 month after starting CPAP and set up ROV 2 months after starting CPAP.

## 2015-02-16 ENCOUNTER — Other Ambulatory Visit: Payer: Self-pay | Admitting: *Deleted

## 2015-02-16 DIAGNOSIS — G4733 Obstructive sleep apnea (adult) (pediatric): Secondary | ICD-10-CM | POA: Diagnosis not present

## 2015-02-17 ENCOUNTER — Encounter: Payer: Self-pay | Admitting: Pulmonary Disease

## 2015-02-17 NOTE — Telephone Encounter (Signed)
Pt has been made aware via MyChart messge.

## 2015-02-17 NOTE — Telephone Encounter (Signed)
Please see phone note from 02/15/15.  Information regarding sleep study is in that phone note.

## 2015-02-17 NOTE — Telephone Encounter (Signed)
HST 02/07/15 >> AHI 53.2, SaO2 low 75%.  Will have my nurse inform pt that sleep study shows severe sleep apnea.  Options are 1) CPAP now, 2) ROV first.  If He is agreeable to CPAP, then please send order for auto CPAP range 5 to 15 cm H2O with heated humidity and mask of choice.  Have download sent 1 month after starting CPAP and set up ROV 2 months after starting CPAP.  

## 2015-02-17 NOTE — Telephone Encounter (Signed)
Pt requesting results of home sleep test.  Please advise.  Thanks!

## 2015-02-17 NOTE — Telephone Encounter (Signed)
(  copied from MyChart message) Charles [name]. I guess it is not suprising. I do a lot of overnight traveling. To fit my lifestyle I will need 2 devices, one for home and one that travels with me. I heard about Transcend Mini CPAP. What is your opinion ? Will these work ?  --------------------  Dr. Craige Cotta please advise if you are ok with ordering this for the pt.  Thanks!

## 2015-02-21 ENCOUNTER — Encounter: Payer: Self-pay | Admitting: Pulmonary Disease

## 2015-02-22 ENCOUNTER — Encounter: Payer: Self-pay | Admitting: Pulmonary Disease

## 2015-02-22 DIAGNOSIS — G4733 Obstructive sleep apnea (adult) (pediatric): Secondary | ICD-10-CM

## 2015-03-18 ENCOUNTER — Encounter: Payer: Self-pay | Admitting: Internal Medicine

## 2015-03-20 MED ORDER — FLUOXETINE HCL 20 MG PO CAPS: ORAL_CAPSULE | ORAL | Status: DC

## 2015-03-22 ENCOUNTER — Ambulatory Visit: Payer: PRIVATE HEALTH INSURANCE | Admitting: Family Medicine

## 2015-04-30 ENCOUNTER — Other Ambulatory Visit: Payer: Self-pay | Admitting: Internal Medicine

## 2015-05-01 ENCOUNTER — Other Ambulatory Visit: Payer: Self-pay | Admitting: Emergency Medicine

## 2015-05-01 ENCOUNTER — Other Ambulatory Visit: Payer: Self-pay | Admitting: Internal Medicine

## 2015-05-01 MED ORDER — ALLOPURINOL 300 MG PO TABS: ORAL_TABLET | ORAL | Status: DC

## 2015-05-01 MED ORDER — ROSUVASTATIN CALCIUM 10 MG PO TABS: 10.0000 mg | ORAL_TABLET | Freq: Every day | ORAL | Status: DC

## 2015-05-09 ENCOUNTER — Other Ambulatory Visit: Payer: Self-pay | Admitting: Internal Medicine

## 2015-05-09 ENCOUNTER — Other Ambulatory Visit: Payer: Self-pay

## 2015-05-09 ENCOUNTER — Encounter: Payer: Self-pay | Admitting: Internal Medicine

## 2015-05-09 MED ORDER — METOPROLOL SUCCINATE ER 100 MG PO TB24: 100.0000 mg | ORAL_TABLET | Freq: Every day | ORAL | Status: DC

## 2015-05-10 MED ORDER — FLUOCINONIDE 0.05 % EX SOLN: 1.0000 "application " | Freq: Two times a day (BID) | CUTANEOUS | Status: DC

## 2015-05-10 MED ORDER — CLOBETASOL PROPIONATE 0.05 % EX CREA: 1.0000 "application " | TOPICAL_CREAM | Freq: Two times a day (BID) | CUTANEOUS | Status: DC

## 2015-05-18 MED ORDER — FLUOCINONIDE 0.05 % EX CREA: 1.0000 "application " | TOPICAL_CREAM | Freq: Two times a day (BID) | CUTANEOUS | Status: DC

## 2015-05-18 NOTE — Addendum Note (Signed)
Addended by: Verlan FriendsAIRRIKIER DAVIDSON, Mayford Alberg M on: 05/18/2015 08:46 AM   Modules accepted: Orders

## 2015-07-06 ENCOUNTER — Encounter: Payer: Self-pay | Admitting: Medical

## 2015-07-06 ENCOUNTER — Ambulatory Visit (INDEPENDENT_AMBULATORY_CARE_PROVIDER_SITE_OTHER): Payer: PRIVATE HEALTH INSURANCE | Admitting: Medical

## 2015-07-06 VITALS — BP 124/82 | HR 87 | Temp 98.1°F | Ht 68.0 in | Wt 249.6 lb

## 2015-07-06 DIAGNOSIS — L739 Follicular disorder, unspecified: Secondary | ICD-10-CM | POA: Diagnosis not present

## 2015-07-06 MED ORDER — DOXYCYCLINE HYCLATE 100 MG PO TABS: 100.0000 mg | ORAL_TABLET | Freq: Two times a day (BID) | ORAL | Status: DC

## 2015-07-06 NOTE — Progress Notes (Signed)
Pre visit review using our clinic review tool, if applicable. No additional management support is needed unless otherwise documented below in the visit note. 

## 2015-07-06 NOTE — Patient Instructions (Addendum)
You appear to have  moderate- severe folliculitis. I will prescribe doxycycline antibiotic. Can continue the antibacterial wash. Try to keep chest dry.   Follow up in 7-10 days for any persisting signs or symptoms. Sooner if needed. Please update [place] on Monday with phone update.  I

## 2015-07-06 NOTE — Progress Notes (Signed)
Subjective:    Patient ID: Charles [name], male    DOB: 1960-08-10, 55 y.o.   MRN: [id number]  HPI   Pt has 3 weeks of scattered red rash between his pectoral muscle. It stings a little. Shows me some pustules. He describes looks like acnes. Pt has been using acne wash with salicylic acid. He thinks it has helped some but not much. He would squeeze some of these area and get mild yellow discharge. After use of wash mild dry dried honey appearance.  Pt does sweat a lot when he works.  Pt never saw blisters.   Review of Systems  Constitutional: Negative for fever, chills and fatigue.  Respiratory: Negative for cough, chest tightness, shortness of breath and wheezing.   Cardiovascular: Negative for chest pain and palpitations.  Musculoskeletal: Negative for back pain.  Skin: Positive for rash.  Hematological: Negative for adenopathy. Does not bruise/bleed easily.  Psychiatric/Behavioral: Negative for behavioral problems.    Past Medical History  Diagnosis Date  . Gout     per pt stable is of 08-18-2014  . Hyperlipidemia LDL goal <100   . Hypertension   . Psoriasis   . CAD (coronary artery disease) 04/2008    successful PCI of the lesion in the proximal LAD using xience drug-eluting stent with improvement in central narrowing from 95% to 0%.  Dr. Juanda Chance  . S/P drug eluting coronary stent placement     05-13-2008   DES X1 TO pLAD  . Sigmoid diverticulosis   . BPH (benign prostatic hypertrophy) with urinary retention   . History of sepsis     urosepsis--  08-13-2014 admission  . Multilevel degenerative disc disease     cervical and lumbar  . History of acute bronchitis     recently 07-16-2014  . Foley catheter in place   . OSA (obstructive sleep apnea)          Social History   Social History  . Marital Status: Married    Spouse Name: [name]  . Number of Children: 1  . Years of Education: Bachelor   Occupational History  . Field Optician, dispensing    Social  History Main Topics  . Smoking status: Former Smoker -- 0.75 packs/day for 14 years    Types: Cigarettes    Quit date: 05/27/1997  . Smokeless tobacco: Former Neurosurgeon    Types: Snuff    Quit date: 08/17/1997  . Alcohol Use: 1.2 oz/week    2 Cans of beer per week     Comment: occasional  . Drug Use: No  . Sexual Activity: Not on file   Other Topics Concern  . Not on file   Social History Narrative   Patient is married to [name]) has 1 child   Patient is right handed   Education level is Bachelor's degree   Caffeine consumption is 1 daily    Past Surgical History  Procedure Laterality Date  . Bunionectomy Right 2000  . Colonoscopy with propofol  last one 10-06-2013  . Wrist ganglion excision Left 2001  . Coronary angioplasty with stent placement  05-13-2008    Dr Charlies Constable    PCI w/  DES x1 to  pLAD/  ef 65%  . Green light laser turp (transurethral resection of prostate N/A 08/23/2014    Procedure: GREEN LIGHT LASER TURP (TRANSURETHRAL RESECTION OF PROSTATE;  Surgeon: Bjorn Pippin, MD;  Location: [facility];  Service: Urology;  Laterality: N/A;    Family History  Problem Relation Age of Onset  . Hypertension Father   . Stroke Father 95  . Lung cancer Mother      smoker  . Diabetes Sister   . Heart disease Brother 49     stent LAD 2012  . Colon cancer Neg Hx   . Pancreatic cancer Neg Hx   . Rectal cancer Neg Hx   . Stomach cancer Neg Hx     Allergies  Allergen Reactions  . Lidocaine Anaphylaxis    REACTION: anaphylactic shock (also he was on Neomycin sulfate)  . Neomycin Sulfate Anaphylaxis    REACTION: anaphylactic shock (also on Lidocaine  topically)  . Sulfa Drugs Cross Reactors Anaphylaxis  . Flexeril [Cyclobenzaprine] Other (See Comments)    05/26/13 urinary retention  . Hctz [Hydrochlorothiazide] Other (See Comments)    Gout   . Tramadol Other (See Comments)    05/26/13 urinary retention  . Atrovent [Ipratropium] Other (See Comments)     Caused Urinary retention (this is the nasal spray)  . Latex Itching and Rash    Skin irritation    Current Outpatient Prescriptions on File Prior to Visit  Medication Sig Dispense Refill  . allopurinol (ZYLOPRIM) 300 MG tablet TAKE 1 TABLET BY MOUTH EVERY DAY 90 tablet 0  . aspirin 325 MG EC tablet Take 325 mg by mouth daily.    . Aspirin-Acetaminophen-Caffeine (EXCEDRIN PO) Take 2 tablets by mouth daily as needed (headache).    . Carboxymethylcellul-Glycerin 0.5-0.9 % SOLN Place 2 drops into both eyes 2 (two) times daily.    . cetirizine (ZYRTEC) 10 MG tablet Take 10 mg by mouth at bedtime.    . clobetasol cream (TEMOVATE) 0.05 % Apply 1 application topically 2 (two) times daily. Apply 1 application topically 2 (two) times daily as needed 300 g 0  . Coenzyme Q10 (CO Q 10 PO) Take 1 capsule by mouth every morning.     . fluocinonide (LIDEX) 0.05 % external solution Apply 1 application topically 2 (two) times daily. Must establish w/new provider for continuity of care 360 mL 0  . fluocinonide cream (LIDEX) 0.05 % Apply 1 application topically 2 (two) times daily. 60 g 3  . FLUoxetine (PROZAC) 20 MG capsule TAKE 1 CAPSULE DAILY---  takes in am 90 capsule 3  . ibuprofen (ADVIL,MOTRIN) 200 MG tablet Take 200 mg by mouth every 6 (six) hours as needed. 2 tabs=400mg  per dose for muscle aches    . metoprolol succinate (TOPROL-XL) 100 MG 24 hr tablet Take 1 tablet (100 mg total) by mouth daily. 90 tablet 0  . Multiple Vitamin (MULTIVITAMIN) capsule Take 1 capsule by mouth every morning.     . nitroGLYCERIN (NITROSTAT) 0.3 MG SL tablet Place 1 tablet (0.3 mg total) under the tongue every 5 (five) minutes as needed for chest pain. 90 tablet 0  . Omega-3 Fatty Acids (FISH OIL) 1000 MG CAPS Take 1,000 mg by mouth every morning.     . rosuvastatin (CRESTOR) 10 MG tablet Take 1 tablet (10 mg total) by mouth daily. 90 tablet 1  . spironolactone (ALDACTONE) 25 MG tablet Take 1 tablet (25 mg total) by  mouth daily. 90 tablet 1   No current facility-administered medications on file prior to visit.    BP 124/82 mmHg  Pulse 87  Temp(Src) 98.1 F (36.7 C) (Oral)  Ht  (1.727 m)  Wt 249 lb 9.6 oz (113.218 kg)  BMI 37.96 kg/m2  SpO2 98%       Objective:  Physical Exam  General- No acute distress. Pleasant patient. Neck- Full range of motion, no jvd Lungs- Clear, even and unlabored. Heart- regular rate and rhythm. Neurologic- CNII- XII grossly intact.  Skin- scattered diffuse inflamed follicles mid chest and on both sides of his pectorals. Some pustules. Some tenderness to touch.      Assessment & Plan:  You appear to have  moderate- severe folliculitis. I will prescribe doxycycline antibiotic. Can continue the antibacterial wash. Try to keep chest dry.   Follow up in 7-10 days for any persisting signs or symptoms. Sooner if needed. Please update [place] on Monday with phone update.

## 2015-07-10 ENCOUNTER — Encounter: Payer: Self-pay | Admitting: Medical

## 2015-07-10 DIAGNOSIS — R21 Rash and other nonspecific skin eruption: Secondary | ICD-10-CM

## 2015-07-10 NOTE — Telephone Encounter (Signed)
Please see referral.

## 2015-07-21 ENCOUNTER — Telehealth: Payer: Self-pay | Admitting: Internal Medicine

## 2015-08-07 ENCOUNTER — Encounter: Payer: Self-pay | Admitting: Family

## 2015-08-07 ENCOUNTER — Ambulatory Visit (INDEPENDENT_AMBULATORY_CARE_PROVIDER_SITE_OTHER): Payer: PRIVATE HEALTH INSURANCE | Admitting: Family

## 2015-08-07 VITALS — BP 128/84 | HR 64 | Temp 97.9°F | Resp 16 | Ht 68.0 in | Wt 248.0 lb

## 2015-08-07 DIAGNOSIS — Z0000 Encounter for general adult medical examination without abnormal findings: Secondary | ICD-10-CM | POA: Diagnosis not present

## 2015-08-07 MED ORDER — ROSUVASTATIN CALCIUM 10 MG PO TABS: 10.0000 mg | ORAL_TABLET | Freq: Every day | ORAL | Status: DC

## 2015-08-07 MED ORDER — METOPROLOL SUCCINATE ER 100 MG PO TB24: 100.0000 mg | ORAL_TABLET | Freq: Every day | ORAL | Status: DC

## 2015-08-07 MED ORDER — ALLOPURINOL 300 MG PO TABS: ORAL_TABLET | ORAL | Status: DC

## 2015-08-07 MED ORDER — FLUOXETINE HCL 20 MG PO CAPS: ORAL_CAPSULE | ORAL | Status: DC

## 2015-08-07 NOTE — Patient Instructions (Signed)
Thank you for choosing [facility].  Summary/Instructions:  Recommend PSA, Hepatitis C, and possibly testosterone.   Continue to work with the Bariatric folks.  Continue to take your medications as prescribed.  Your prescription(s) have been submitted to your pharmacy or been printed and provided for you. Please take as directed and contact our office if you believe you are having problem(s) with the medication(s) or have any questions.  If your symptoms worsen or fail to improve, please contact our office for further instruction, or in case of emergency go directly to the emergency room at the closest medical facility.   Health Maintenance, Male A healthy lifestyle and preventative care can promote health and wellness.  Maintain regular health, dental, and eye exams.  Eat a healthy diet. Foods like vegetables, fruits, whole grains, low-fat dairy products, and lean protein foods contain the nutrients you need and are low in calories. Decrease your intake of foods high in solid fats, added sugars, and salt. Get information about a proper diet from your health care provider, if necessary.  Regular physical exercise is one of the most important things you can do for your health. Most adults should get at least 150 minutes of moderate-intensity exercise (any activity that increases your heart rate and causes you to sweat) each week. In addition, most adults need muscle-strengthening exercises on 2 or more days a week.   Maintain a healthy weight. The body mass index (BMI) is a screening tool to identify possible weight problems. It provides an estimate of body fat based on height and weight. Your health care provider can find your BMI and can help you achieve or maintain a healthy weight. For males 20 years and older:  A BMI below 18.5 is considered underweight.  A BMI of 18.5 to 24.9 is normal.  A BMI of 25 to 29.9 is considered overweight.  A BMI of 30 and above is considered  obese.  Maintain normal blood lipids and cholesterol by exercising and minimizing your intake of saturated fat. Eat a balanced diet with plenty of fruits and vegetables. Blood tests for lipids and cholesterol should begin at age 19 and be repeated every 5 years. If your lipid or cholesterol levels are high, you are over age 67, or you are at high risk for heart disease, you may need your cholesterol levels checked more frequently.Ongoing high lipid and cholesterol levels should be treated with medicines if diet and exercise are not working.  If you smoke, find out from your health care provider how to quit. If you do not use tobacco, do not start.  Lung cancer screening is recommended for adults aged 55-80 years who are at high risk for developing lung cancer because of a history of smoking. A yearly low-dose CT scan of the lungs is recommended for people who have at least a 30-pack-year history of smoking and are current smokers or have quit within the past 15 years. A pack year of smoking is smoking an average of 1 pack of cigarettes a day for 1 year (for example, a 30-pack-year history of smoking could mean smoking 1 pack a day for 30 years or 2 packs a day for 15 years). Yearly screening should continue until the smoker has stopped smoking for at least 15 years. Yearly screening should be stopped for people who develop a health problem that would prevent them from having lung cancer treatment.  If you choose to drink alcohol, do not have more than 2 drinks per day.  One drink is considered to be 12 oz (360 mL) of beer, 5 oz (150 mL) of wine, or 1.5 oz (45 mL) of liquor.  Avoid the use of street drugs. Do not share needles with anyone. Ask for help if you need support or instructions about stopping the use of drugs.  High blood pressure causes heart disease and increases the risk of stroke. High blood pressure is more likely to develop in:  People who have blood pressure in the end of the normal  range (100-139/85-89 mm Hg).  People who are overweight or obese.  People who are African American.  If you are 7018-57 years of age, have your blood pressure checked every 3-5 years. If you are 57 years of age or older, have your blood pressure checked every year. You should have your blood pressure measured twice--once when you are at a hospital or clinic, and once when you are not at a hospital or clinic. Record the average of the two measurements. To check your blood pressure when you are not at a hospital or clinic, you can use:  An automated blood pressure machine at a pharmacy.  A home blood pressure monitor.  If you are 6745-57 years old, ask your health care provider if you should take aspirin to prevent heart disease.  Diabetes screening involves taking a blood sample to check your fasting blood sugar level. This should be done once every 3 years after age 57 if you are at a normal weight and without risk factors for diabetes. Testing should be considered at a younger age or be carried out more frequently if you are overweight and have at least 1 risk factor for diabetes.  Colorectal cancer can be detected and often prevented. Most routine colorectal cancer screening begins at the age of 57 and continues through age 57. However, your health care provider may recommend screening at an earlier age if you have risk factors for colon cancer. On a yearly basis, your health care provider may provide home test kits to check for hidden blood in the stool. A small camera at the end of a tube may be used to directly examine the colon (sigmoidoscopy or colonoscopy) to detect the earliest forms of colorectal cancer. Talk to your health care provider about this at age 57 when routine screening begins. A direct exam of the colon should be repeated every 5-10 years through age 57, unless early forms of precancerous polyps or small growths are found.  People who are at an increased risk for hepatitis B  should be screened for this virus. You are considered at high risk for hepatitis B if:  You were born in a country where hepatitis B occurs often. Talk with your health care provider about which countries are considered high risk.  Your parents were born in a high-risk country and you have not received a shot to protect against hepatitis B (hepatitis B vaccine).  You have HIV or AIDS.  You use needles to inject street drugs.  You live with, or have sex with, someone who has hepatitis B.  You are a man who has sex with other men (MSM).  You get hemodialysis treatment.  You take certain medicines for conditions like cancer, organ transplantation, and autoimmune conditions.  Hepatitis C blood testing is recommended for all people born from [id number] through 1967 and any individual with known risk factors for hepatitis C.  Healthy men should no longer receive prostate-specific antigen (PSA) blood tests as part of  routine cancer screening. Talk to your health care provider about prostate cancer screening.  Testicular cancer screening is not recommended for adolescents or adult males who have no symptoms. Screening includes self-exam, a health care provider exam, and other screening tests. Consult with your health care provider about any symptoms you have or any concerns you have about testicular cancer.  Practice safe sex. Use condoms and avoid high-risk sexual practices to reduce the spread of sexually transmitted infections (STIs).  You should be screened for STIs, including gonorrhea and chlamydia if:  You are sexually active and are younger than 24 years.  You are older than 24 years, and your health care provider tells you that you are at risk for this type of infection.  Your sexual activity has changed since you were last screened, and you are at an increased risk for chlamydia or gonorrhea. Ask your health care provider if you are at risk.  If you are at risk of being infected with  HIV, it is recommended that you take a prescription medicine daily to prevent HIV infection. This is called pre-exposure prophylaxis (PrEP). You are considered at risk if:  You are a man who has sex with other men (MSM).  You are a heterosexual man who is sexually active with multiple partners.  You take drugs by injection.  You are sexually active with a partner who has HIV.  Talk with your health care provider about whether you are at high risk of being infected with HIV. If you choose to begin PrEP, you should first be tested for HIV. You should then be tested every 3 months for as long as you are taking PrEP.  Use sunscreen. Apply sunscreen liberally and repeatedly throughout the day. You should seek shade when your shadow is shorter than you. Protect yourself by wearing long sleeves, pants, a wide-brimmed hat, and sunglasses year round whenever you are outdoors.  Tell your health care provider of new moles or changes in moles, especially if there is a change in shape or color. Also, tell your health care provider if a mole is larger than the size of a pencil eraser.  A one-time screening for abdominal aortic aneurysm (AAA) and surgical repair of large AAAs by ultrasound is recommended for men aged 65-75 years who are current or former smokers.  Stay current with your vaccines (immunizations).   This information is not intended to replace advice given to you by your health care provider. Make sure you discuss any questions you have with your health care provider.   Document Released: 11/09/2007 Document Revised: 06/03/2014 Document Reviewed: 10/08/2010 Elsevier Interactive Patient Education [employer]

## 2015-08-07 NOTE — Assessment & Plan Note (Signed)
1) Anticipatory Guidance: Discussed importance of wearing a seatbelt while driving and not texting while driving; changing batteries in smoke detector at least once annually; wearing suntan lotion when outside; eating a balanced and moderate diet; getting physical activity at least 30 minutes per day.  2) Immunizations / Screenings / Labs:  All immunizations are up-to-date per recommendations. Due for an eye exam encouraged to be completed independently. Declines PSA and hepatitis C screening today. All other screenings are up-to-date per recommendations. Previous blood work reviewed with significant findings including low vitamin D, elevated triglyceride level, and low testosterone. Patient declines additional blood work today.  Overall well exam with risk factors for cardiovascular disease including hypertension, hyperlipidemia, obesity, and sedentary lifestyle. Hypertension and hyperlipidemia are currently managed with prescription medications. Hypertension appears adequately controlled. Hyperlipidemia with elevated hypertriglyceridemia. Continue current dosage of Crestor at this time. Discussed importance of increasing physical activity to 30 minutes of moderate level activity daily. Nutritionally working to consume a diet that is balanced, varied, moderate and focused on nutrient dense foods and is low in saturated fats and low and processed/sugary foods. Recommended weight loss goal approximate 5-10% of current body weight through lifestyle changes. He is currently working with bariatrics to help with his weight loss. Follow-up prevention exam in 1 year. Follow-up office visit in 3 months or sooner if needed for chronic condition management.

## 2015-08-07 NOTE — Progress Notes (Signed)
Pre visit review using our clinic review tool, if applicable. No additional management support is needed unless otherwise documented below in the visit note. 

## 2015-08-07 NOTE — Progress Notes (Signed)
Subjective:    Patient ID: Charles [name], male    DOB: 1961-04-10, 55 y.o.   MRN: [id number]  Chief Complaint  Patient presents with  . CPE    Would like to have a year supply of meds, low testosterone and vitamin d, has been sweating     HPI:  Charles [name] is a 55 y.o. male who presents today for an annual wellness visit.   1) Health Maintenance -   Diet - Averages about 3 meals per day consisting of cereal, dairy, pork, pizza, chicken, beef, fruit, occasional vegetables; Caffeine 1-2 cups per day.   Exercise - 1-2x per week for about 10 minutes walking; describes work as aggressive with sitting, bending, stretching, twisting.     2) Preventative Exams / Immunizations:  Dental -- Up to date  Vision -- Due for exam    Health Maintenance  Topic Date Due  . Hepatitis C Screening  01960-11-14  . HIV Screening  12/14/1973  . INFLUENZA VACCINE  12/26/2015  . TETANUS/TDAP  03/13/2020  . COLONOSCOPY  10/07/2023    Immunization History  Administered Date(s) Administered  . Hepatitis B 08/10/2009, 09/12/2009, 03/13/2010  . Influenza Split 02/25/2011, 02/18/2012  . Influenza Whole 08/25/2008, 03/21/2009, 02/19/2010  . Influenza, High Dose Seasonal PF 03/12/2014  . Influenza,inj,Quad PF,36+ Mos 02/16/2013  . Influenza-Unspecified 01/22/2015  . PPD Test 02/25/2011, 02/18/2012, 02/16/2013, 06/14/2014  . Td 03/13/2010   Allergies  Allergen Reactions  . Lidocaine Anaphylaxis    REACTION: anaphylactic shock (also he was on Neomycin sulfate)  . Neomycin Sulfate Anaphylaxis    REACTION: anaphylactic shock (also on Lidocaine  topically)  . Sulfa Drugs Cross Reactors Anaphylaxis  . Flexeril [Cyclobenzaprine] Other (See Comments)    05/26/13 urinary retention  . Hctz [Hydrochlorothiazide] Other (See Comments)    Gout   . Tramadol Other (See Comments)    05/26/13 urinary retention  . Atrovent [Ipratropium] Other (See Comments)    Caused Urinary retention (this is the  nasal spray)  . Latex Itching and Rash    Skin irritation     Outpatient Prescriptions Prior to Visit  Medication Sig Dispense Refill  . aspirin 325 MG EC tablet Take 325 mg by mouth daily.    . Aspirin-Acetaminophen-Caffeine (EXCEDRIN PO) Take 2 tablets by mouth daily as needed (headache).    . Carboxymethylcellul-Glycerin 0.5-0.9 % SOLN Place 2 drops into both eyes 2 (two) times daily.    . cetirizine (ZYRTEC) 10 MG tablet Take 10 mg by mouth at bedtime.    . clobetasol cream (TEMOVATE) 0.05 % Apply 1 application topically 2 (two) times daily. Apply 1 application topically 2 (two) times daily as needed 300 g 0  . Coenzyme Q10 (CO Q 10 PO) Take 1 capsule by mouth every morning. 500mg     . fluocinonide (LIDEX) 0.05 % external solution Apply 1 application topically 2 (two) times daily. Must establish w/new provider for continuity of care 360 mL 0  . ibuprofen (ADVIL,MOTRIN) 200 MG tablet Take 200 mg by mouth every 6 (six) hours as needed. 2 tabs=400mg  per dose for muscle aches    . Multiple Vitamin (MULTIVITAMIN) capsule Take 1 capsule by mouth every morning.     . nitroGLYCERIN (NITROSTAT) 0.3 MG SL tablet Place 1 tablet (0.3 mg total) under the tongue every 5 (five) minutes as needed for chest pain. 90 tablet 0  . Omega-3 Fatty Acids (FISH OIL) 1000 MG CAPS Take 1,000 mg by mouth every morning.     .Marland Kitchen  allopurinol (ZYLOPRIM) 300 MG tablet TAKE 1 TABLET BY MOUTH EVERY DAY 90 tablet 0  . doxycycline (VIBRA-TABS) 100 MG tablet Take 1 tablet (100 mg total) by mouth 2 (two) times daily. 20 tablet 0  . fluocinonide cream (LIDEX) 0.05 % Apply 1 application topically 2 (two) times daily. 60 g 3  . FLUoxetine (PROZAC) 20 MG capsule TAKE 1 CAPSULE DAILY---  takes in am 90 capsule 3  . metoprolol succinate (TOPROL-XL) 100 MG 24 hr tablet Take 1 tablet (100 mg total) by mouth daily. 90 tablet 0  . rosuvastatin (CRESTOR) 10 MG tablet Take 1 tablet (10 mg total) by mouth daily. 90 tablet 1  .  spironolactone (ALDACTONE) 25 MG tablet Take 1 tablet (25 mg total) by mouth daily. 90 tablet 1   No facility-administered medications prior to visit.     Past Medical History  Diagnosis Date  . Gout     per pt stable is of 08-18-2014  . Hyperlipidemia LDL goal <100   . Hypertension   . Psoriasis   . CAD (coronary artery disease) 04/2008    successful PCI of the lesion in the proximal LAD using xience drug-eluting stent with improvement in central narrowing from 95% to 0%.  Dr. Juanda Chance  . S/P drug eluting coronary stent placement     05-13-2008   DES X1 TO pLAD  . Sigmoid diverticulosis   . BPH (benign prostatic hypertrophy) with urinary retention   . History of sepsis     urosepsis--  08-13-2014 admission  . Multilevel degenerative disc disease     cervical and lumbar  . History of acute bronchitis     recently 07-16-2014  . Foley catheter in place   . OSA (obstructive sleep apnea)           Past Surgical History  Procedure Laterality Date  . Bunionectomy Right 2000  . Colonoscopy with propofol  last one 10-06-2013  . Wrist ganglion excision Left 2001  . Coronary angioplasty with stent placement  05-13-2008    Dr Charlies Constable    PCI w/  DES x1 to  pLAD/  ef 65%  . Green light laser turp (transurethral resection of prostate N/A 08/23/2014    Procedure: GREEN LIGHT LASER TURP (TRANSURETHRAL RESECTION OF PROSTATE;  Surgeon: Bjorn Pippin, MD;  Location: [facility];  Service: Urology;  Laterality: N/A;     Family History  Problem Relation Age of Onset  . Hypertension Father   . Stroke Father 12  . Lung cancer Mother      smoker  . Diabetes Sister   . Heart disease Brother 49     stent LAD 2012  . Colon cancer Neg Hx   . Pancreatic cancer Neg Hx   . Rectal cancer Neg Hx   . Stomach cancer Neg Hx      Social History   Social History  . Marital Status: Married    Spouse Name: [name]  . Number of Children: 1  . Years of Education: Bachelor    Occupational History  . Field Optician, dispensing    Social History Main Topics  . Smoking status: Former Smoker -- 0.75 packs/day for 14 years    Types: Cigarettes    Quit date: 05/27/1997  . Smokeless tobacco: Former Neurosurgeon    Types: Snuff    Quit date: 08/17/1997  . Alcohol Use: 1.2 oz/week    2 Cans of beer per week     Comment: occasional  .  Drug Use: No  . Sexual Activity: Not on file   Other Topics Concern  . Not on file   Social History Narrative   Patient is married to [name]) has 1 child   Patient is right handed   Education level is Bachelor's degree   Caffeine consumption is 1 daily    Review of Systems  Constitutional: Denies fever, chills, fatigue, or significant weight gain/loss. HENT: Head: Denies headache or neck pain Ears: Denies changes in hearing, ringing in ears, earache, drainage Nose: Denies discharge, stuffiness, itching, nosebleed, sinus pain Throat: Denies sore throat, hoarseness, dry mouth, sores, thrush Eyes: Denies loss/changes in vision, pain, redness, blurry/double vision, flashing lights Cardiovascular: Denies chest pain/discomfort, tightness, palpitations, shortness of breath with activity, difficulty lying down, swelling, sudden awakening with shortness of breath Respiratory: Denies shortness of breath, cough, sputum production, wheezing Gastrointestinal: Denies dysphasia, heartburn, change in appetite, nausea, change in bowel habits, rectal bleeding, constipation, diarrhea, yellow skin or eyes Genitourinary: Denies frequency, urgency, burning/pain, blood in urine, incontinence, change in urinary strength. Musculoskeletal: Denies muscle/joint pain, stiffness, back pain, redness or swelling of joints, trauma Skin: Denies rashes, lumps, itching, dryness, color changes, or hair/nail changes Neurological: Denies dizziness, fainting, seizures, weakness, numbness, tingling, tremor Psychiatric - Denies nervousness, stress, depression or memory  loss Endocrine: Denies heat or cold intolerance, sweating, frequent urination, excessive thirst, changes in appetite Hematologic: Denies ease of bruising or bleeding     Objective:    BP 128/84 mmHg  Pulse 64  Temp(Src) 97.9 F (36.6 C) (Oral)  Resp 16  Ht 5\' 8"  (1.727 m)  Wt 248 lb (112.492 kg)  BMI 37.72 kg/m2  SpO2 97% Nursing note and vital signs reviewed.  Physical Exam  Constitutional: He is oriented to person, place, and time. He appears well-developed and well-nourished.  HENT:  Head: Normocephalic.  Right Ear: Hearing, tympanic membrane, external ear and ear canal normal.  Left Ear: Hearing, tympanic membrane, external ear and ear canal normal.  Nose: Nose normal.  Mouth/Throat: Uvula is midline, oropharynx is clear and moist and mucous membranes are normal.  Eyes: Conjunctivae and EOM are normal. Pupils are equal, round, and reactive to light.  Neck: Neck supple. No JVD present. No tracheal deviation present. No thyromegaly present.  Cardiovascular: Normal rate, regular rhythm, normal heart sounds and intact distal pulses.   Pulmonary/Chest: Effort normal and breath sounds normal.  Abdominal: Soft. Bowel sounds are normal. He exhibits no distension and no mass. There is no tenderness. There is no rebound and no guarding.  Musculoskeletal: Normal range of motion. He exhibits no edema or tenderness.  Lymphadenopathy:    He has no cervical adenopathy.  Neurological: He is alert and oriented to person, place, and time. He has normal reflexes. No cranial nerve deficit. He exhibits normal muscle tone. Coordination normal.  Skin: Skin is warm and dry.  Psychiatric: He has a normal mood and affect. His behavior is normal. Judgment and thought content normal.       Assessment & Plan:   Problem List Items Addressed This Visit      Other   Routine general medical examination at a health care facility - Primary    1) Anticipatory Guidance: Discussed importance of wearing  a seatbelt while driving and not texting while driving; changing batteries in smoke detector at least once annually; wearing suntan lotion when outside; eating a balanced and moderate diet; getting physical activity at least 30 minutes per day.  2) Immunizations / Screenings / Labs:  All immunizations are up-to-date per recommendations. Due for an eye exam encouraged to be completed independently. Declines PSA and hepatitis C screening today. All other screenings are up-to-date per recommendations. Previous blood work reviewed with significant findings including low vitamin D, elevated triglyceride level, and low testosterone. Patient declines additional blood work today.  Overall well exam with risk factors for cardiovascular disease including hypertension, hyperlipidemia, obesity, and sedentary lifestyle. Hypertension and hyperlipidemia are currently managed with prescription medications. Hypertension appears adequately controlled. Hyperlipidemia with elevated hypertriglyceridemia. Continue current dosage of Crestor at this time. Discussed importance of increasing physical activity to 30 minutes of moderate level activity daily. Nutritionally working to consume a diet that is balanced, varied, moderate and focused on nutrient dense foods and is low in saturated fats and low and processed/sugary foods. Recommended weight loss goal approximate 5-10% of current body weight through lifestyle changes. He is currently working with bariatrics to help with his weight loss. Follow-up prevention exam in 1 year. Follow-up office visit in 3 months or sooner if needed for chronic condition management.

## 2015-09-11 IMAGING — CT CT RENAL STONE PROTOCOL
2 of 4 series · 17 of 46 positions shown, 19 images · non-contrast
Comparison: None.

CLINICAL DATA: Fever and chills with bilateral flank pain

EXAM:
CT ABDOMEN AND PELVIS WITHOUT CONTRAST
TECHNIQUE: Multidetector CT imaging of the abdomen and pelvis was performed
following the standard protocol without IV contrast.

[Series 2: renal stone < 200 lbs 5.0 b31f · axial · 0.85mm/px · z∈[-607,-82]mm · 14 of 115 slices shown, 16 images]
[im 5/115  soft-tissue]
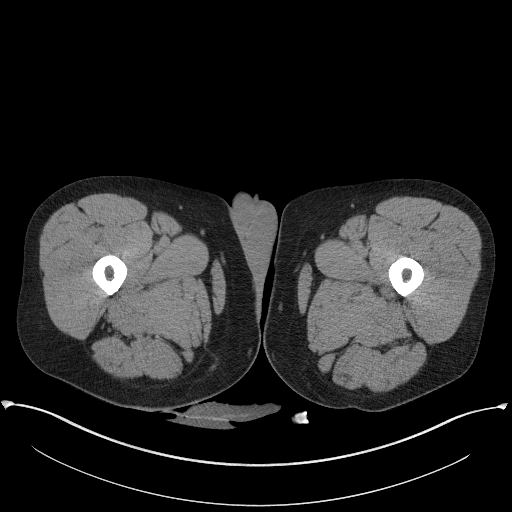
[im 5/115  bone]
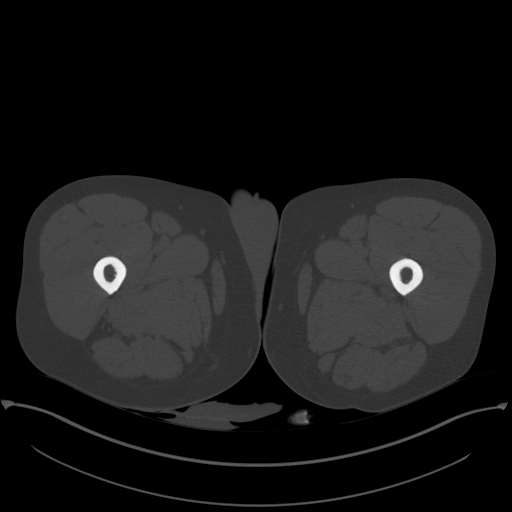
[im 14/115  soft-tissue]
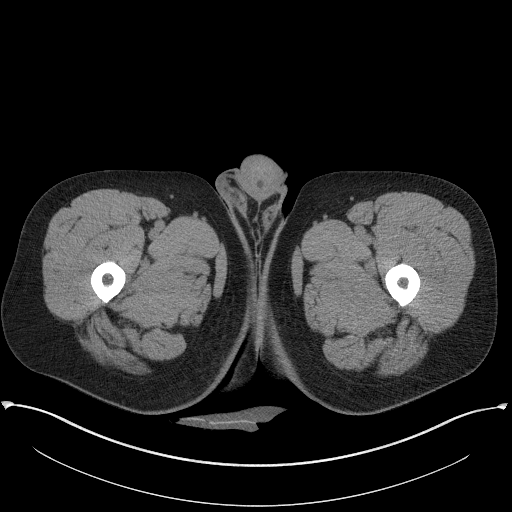
[im 23/115  soft-tissue]
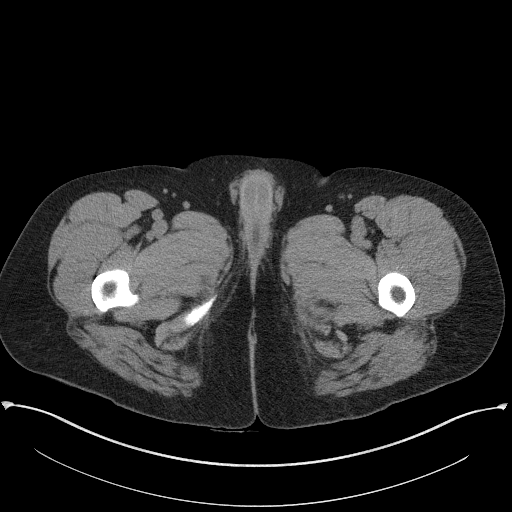
[im 32/115  soft-tissue]
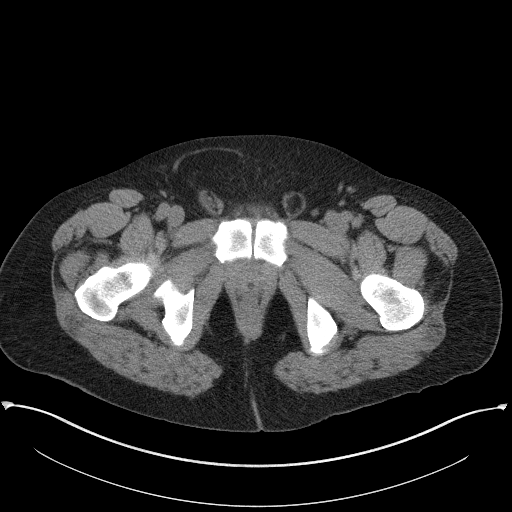
[im 37/115  soft-tissue]
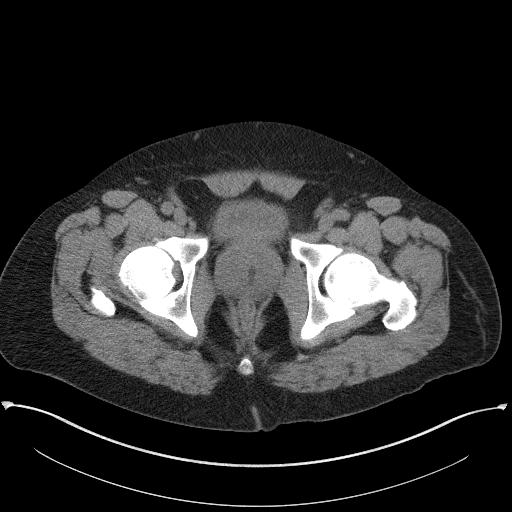
[im 46/115  soft-tissue]
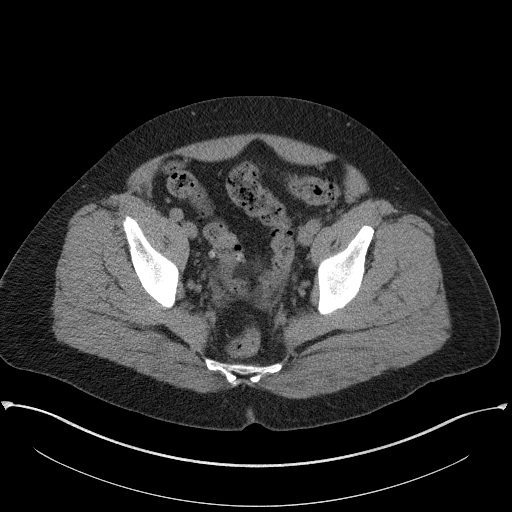
[im 55/115  soft-tissue]
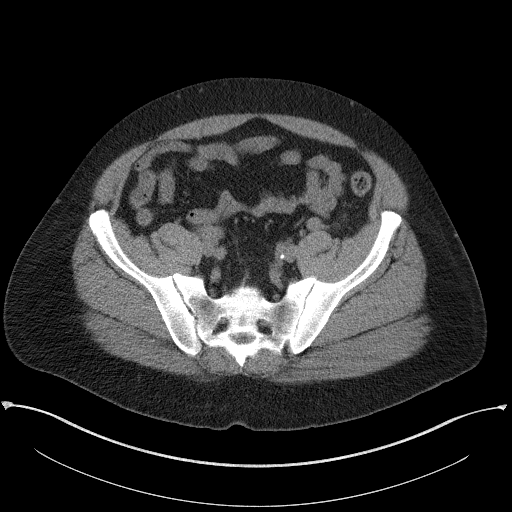
[im 60/115  soft-tissue]
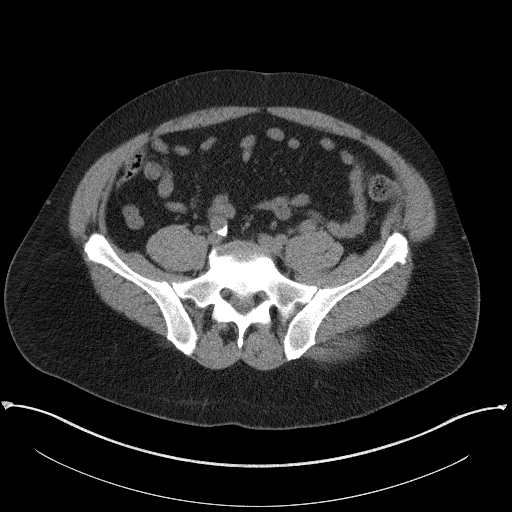
[im 69/115  soft-tissue]
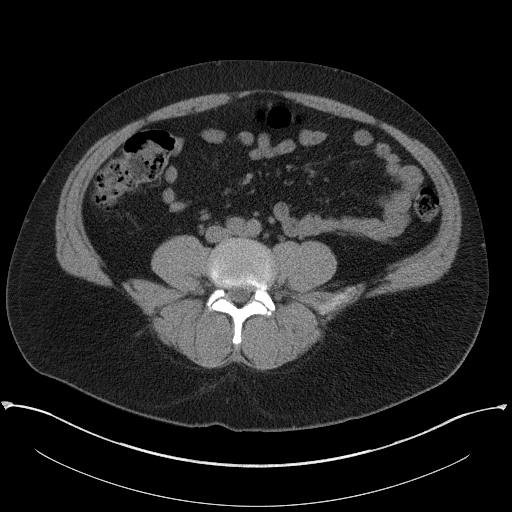
[im 69/115  bone]
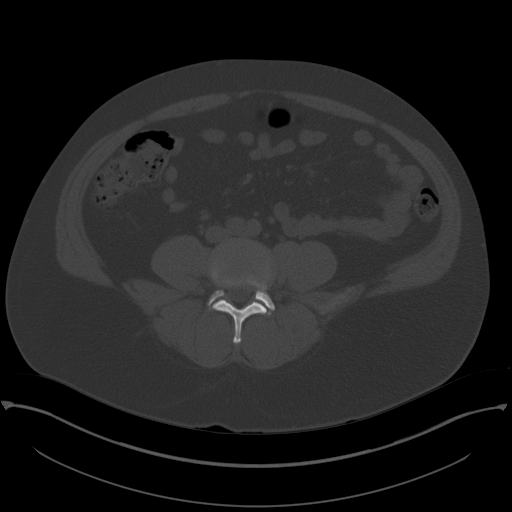
[im 78/115  soft-tissue]
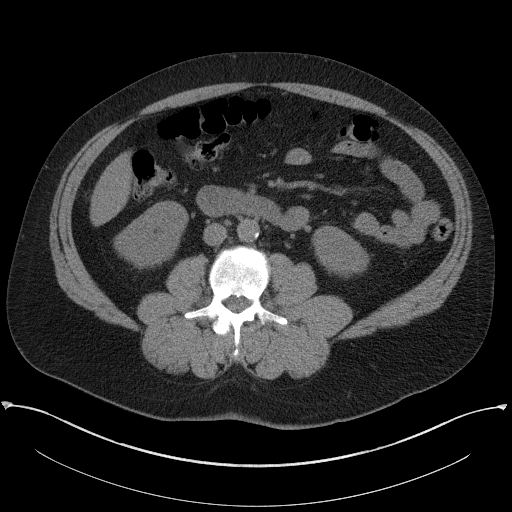
[im 87/115  soft-tissue]
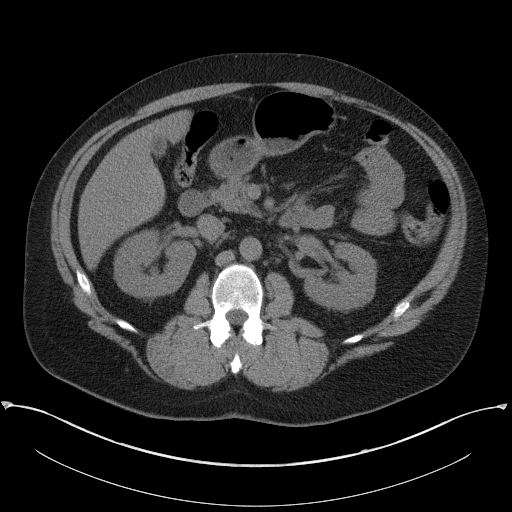
[im 92/115  soft-tissue]
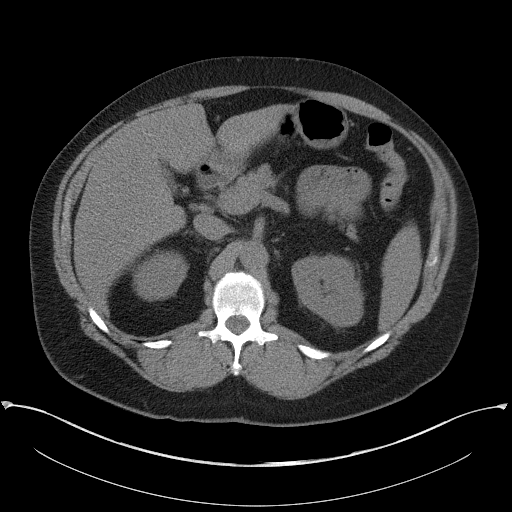
[im 101/115  soft-tissue]
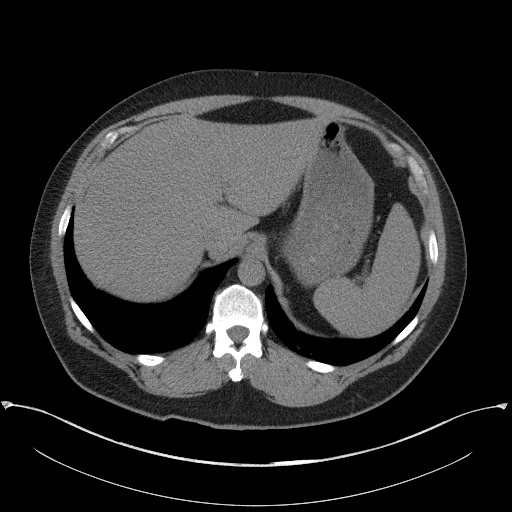
[im 110/115  soft-tissue]
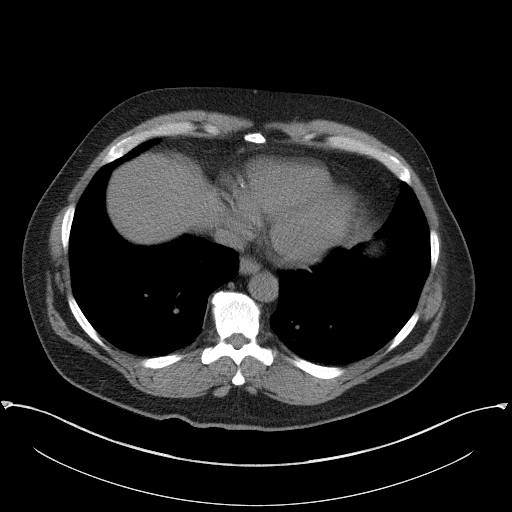

[Series 5: renal stone 3.0 coronal · coronal · 0.93mm/px · 3 of 104 slices shown]
[im 35/104  soft-tissue]
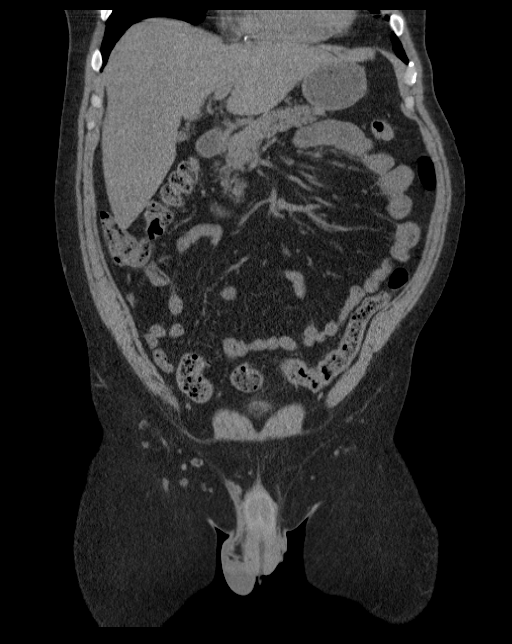
[im 46/104  soft-tissue]
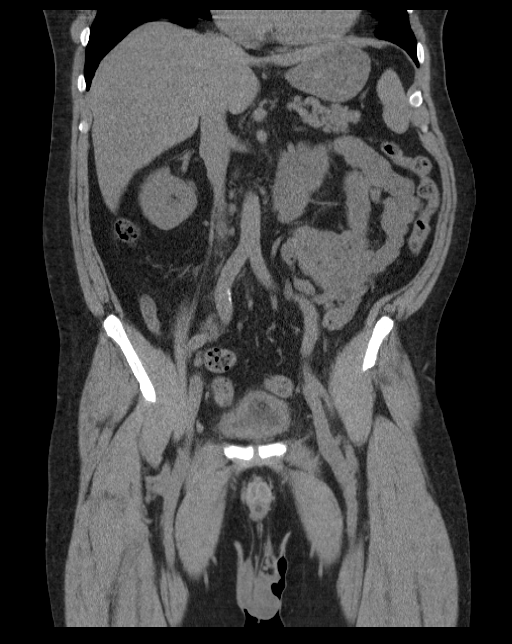
[im 58/104  soft-tissue]
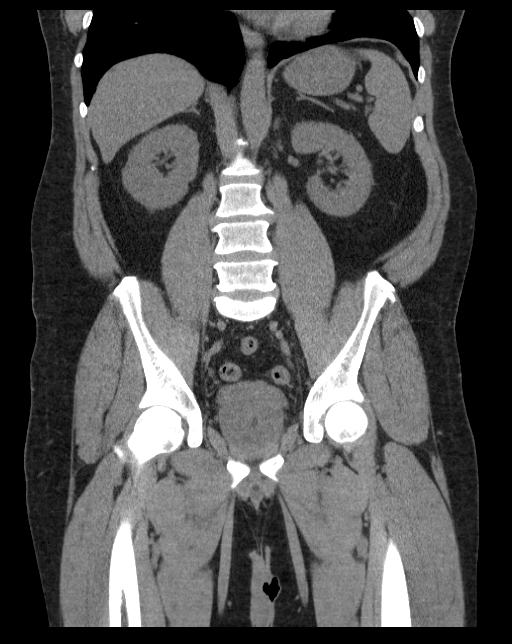

[17 of 46 positions shown; findings below may reference images not displayed]

FINDINGS: There is no urinary calculus. There is no hydronephrosis or ureteral
dilatation

There are unremarkable unenhanced appearances of the liver, spleen,
pancreas, adrenals, kidneys and gallbladder.

Bowel and mesentery appear unremarkable. The appendix is normal.
There is mild uncomplicated colonic diverticulosis.

There is no adenopathy. There is no ascites. There are no acute
inflammatory changes in the abdomen or pelvis. No significant
musculoskeletal abnormalities are evident. There is a tiny fat
containing umbilical hernia.
IMPRESSION: No acute findings in the abdomen or pelvis. Mild colonic
diverticulosis. Tiny fat containing umbilical hernia.

## 2016-01-26 ENCOUNTER — Other Ambulatory Visit: Payer: Self-pay | Admitting: Internal Medicine

## 2016-02-23 ENCOUNTER — Ambulatory Visit (INDEPENDENT_AMBULATORY_CARE_PROVIDER_SITE_OTHER): Payer: 59 | Admitting: Behavioral Health

## 2016-02-23 DIAGNOSIS — Z23 Encounter for immunization: Secondary | ICD-10-CM

## 2016-02-23 NOTE — Progress Notes (Addendum)
Pre visit review using our clinic review tool, if applicable. No additional management support is needed unless otherwise documented below in the visit note.  Patient in clinic for TB skin test and Influenza vaccination. Sent the patient's PCP a message regarding appointment today. Selena Battenody, PA-C gave verbal for PPD placement.  Patient tolerated both injections well. He will return on Monday morning to have PPD read.

## 2016-02-26 ENCOUNTER — Encounter: Payer: Self-pay | Admitting: Behavioral Health

## 2016-02-26 LAB — TB SKIN TEST
Induration: 0 mm
TB Skin Test: NEGATIVE

## 2016-04-11 ENCOUNTER — Ambulatory Visit (INDEPENDENT_AMBULATORY_CARE_PROVIDER_SITE_OTHER): Payer: 59 | Admitting: Medical

## 2016-04-11 ENCOUNTER — Telehealth: Payer: Self-pay | Admitting: Medical

## 2016-04-11 ENCOUNTER — Encounter: Payer: Self-pay | Admitting: Medical

## 2016-04-11 VITALS — BP 138/83 | HR 68 | Temp 98.5°F | Wt 254.4 lb

## 2016-04-11 DIAGNOSIS — R0981 Nasal congestion: Secondary | ICD-10-CM | POA: Diagnosis not present

## 2016-04-11 DIAGNOSIS — J302 Other seasonal allergic rhinitis: Secondary | ICD-10-CM | POA: Diagnosis not present

## 2016-04-11 DIAGNOSIS — J0100 Acute maxillary sinusitis, unspecified: Secondary | ICD-10-CM | POA: Diagnosis not present

## 2016-04-11 MED ORDER — AMOXICILLIN-POT CLAVULANATE 875-125 MG PO TABS: 1.0000 | ORAL_TABLET | Freq: Two times a day (BID) | ORAL | 0 refills | Status: DC

## 2016-04-11 MED ORDER — METHYLPREDNISOLONE ACETATE 40 MG/ML IJ SUSP
40.0000 mg | Freq: Once | INTRAMUSCULAR | Status: AC
  Administered 2016-04-11: 40 mg via INTRAMUSCULAR

## 2016-04-11 NOTE — Patient Instructions (Signed)
You appear to have had allergy symptoms for one month followed by sinus infection. Continue you flonase and zyrtec. We gave you DepoMedrol 40 mg im today and prescribing Augmentin antibiotic.  Your symptoms should improve. If any worse signs or symptoms please let us know.  Follow up in 7 days or as needed

## 2016-04-11 NOTE — Progress Notes (Signed)
Pre visit review using our clinic review tool, if applicable. No additional management support is needed unless otherwise documented below in the visit note. 

## 2016-04-11 NOTE — Telephone Encounter (Signed)
Please call pt mail order pharmacy and cancel the augmentin I sent.

## 2016-04-11 NOTE — Progress Notes (Signed)
Subjective:    Patient ID: Charles BoucheRobert [name], male    DOB: August 29, 1960, 55 y.o.   MRN: [id number]  HPI  Pt states 4 weeks of costant pnd. Then past 2 weeks he developed nasal congestion and sinus pressure. PND got better but now sinus pressure is increasing. No upper teeth pain. No mucous when blows nose. Feels mild clammy and warm at times intermittently.  Pt does have history of sinus infection in past. Usually one time year. At onset he was sneezing a lot and that stopped. One day early on sneezed almost all day per his report.  Pt is on zyrtec one q day and flonase.   Pt is on mucinex as well.     Review of Systems  Constitutional: Positive for chills and fever. Negative for fatigue.       See hpi.  HENT: Positive for congestion, postnasal drip, rhinorrhea and sinus pressure. Negative for ear pain and sore throat.   Respiratory: Negative for cough, chest tightness, shortness of breath and wheezing.   Cardiovascular: Negative for chest pain and palpitations.  Gastrointestinal: Negative for abdominal pain.  Musculoskeletal: Negative for back pain.  Skin: Negative for rash.  Neurological: Negative for dizziness, weakness and headaches.  Hematological: Negative for adenopathy. Does not bruise/bleed easily.  Psychiatric/Behavioral: Negative for behavioral problems.    Past Medical History:  Diagnosis Date  . BPH (benign prostatic hypertrophy) with urinary retention   . CAD (coronary artery disease) 04/2008   successful PCI of the lesion in the proximal LAD using xience drug-eluting stent with improvement in central narrowing from 95% to 0%.  Dr. Juanda ChanceBrodie  . Foley catheter in place   . Gout    per pt stable is of 08-18-2014  . History of acute bronchitis    recently 07-16-2014  . History of sepsis    urosepsis--  08-13-2014 admission  . Hyperlipidemia LDL goal <100   . Hypertension   . Multilevel degenerative disc disease    cervical and lumbar  . OSA (obstructive sleep  apnea)       . Psoriasis   . S/P drug eluting coronary stent placement    05-13-2008   DES X1 TO pLAD  . Sigmoid diverticulosis      Social History   Social History  . Marital status: Married    [name]  . Number of children: 1  . Years of education: Bachelor   Occupational History  . Field Optician, dispensingsupport Engineer    Social History Main Topics  . Smoking status: Former Smoker    Packs/day: 0.75    Years: 14.00    Types: Cigarettes    Quit date: 05/27/1997  . Smokeless tobacco: Former NeurosurgeonUser    Types: Snuff    Quit date: 08/17/1997  . Alcohol use 1.2 oz/week    2 Cans of beer per week     Comment: occasional  . Drug use: No  . Sexual activity: Not on file   Other Topics Concern  . Not on file   Social History Narrative   [name] has 1 child   Patient is right handed   Education level is Bachelor's degree   Caffeine consumption is 1 daily    Past Surgical History:  Procedure Laterality Date  . BUNIONECTOMY Right 2000  . COLONOSCOPY WITH PROPOFOL  last one 10-06-2013  . CORONARY ANGIOPLASTY WITH STENT PLACEMENT  05-13-2008    [name]   PCI w/  DES x1  to  pLAD/  ef 65%  . GREEN LIGHT LASER TURP (TRANSURETHRAL RESECTION OF PROSTATE N/A 08/23/2014   Procedure: GREEN LIGHT LASER TURP (TRANSURETHRAL RESECTION OF PROSTATE;  Surgeon: Bjorn Pippin, MD;  Location[facility];  Service: Urology;  Laterality: N/A;  . WRIST GANGLION EXCISION Left 2001    Family History  Problem Relation Age of Onset  . Hypertension Father   . Stroke Father 53  . Lung cancer Mother      smoker  . Diabetes Sister   . Heart disease Brother 49     stent LAD 2012  . Colon cancer Neg Hx   . Pancreatic cancer Neg Hx   . Rectal cancer Neg Hx   . Stomach cancer Neg Hx     Allergies  Allergen Reactions  . Lidocaine Anaphylaxis    REACTION: anaphylactic shock (also he was on Neomycin sulfate)  . Neomycin Sulfate Anaphylaxis    REACTION:  anaphylactic shock (also on Lidocaine  topically)  . Sulfa Drugs Cross Reactors Anaphylaxis  . Flexeril [Cyclobenzaprine] Other (See Comments)    05/26/13 urinary retention  . Hctz [Hydrochlorothiazide] Other (See Comments)    Gout   . Tramadol Other (See Comments)    05/26/13 urinary retention  . Atrovent [Ipratropium] Other (See Comments)    Caused Urinary retention (this is the nasal spray)  . Latex Itching and Rash    Skin irritation    Current Outpatient Prescriptions on File Prior to Visit  Medication Sig Dispense Refill  . allopurinol (ZYLOPRIM) 300 MG tablet TAKE 1 TABLET BY MOUTH EVERY DAY 90 tablet 3  . aspirin 325 MG EC tablet Take 325 mg by mouth daily.    . Aspirin-Acetaminophen-Caffeine (EXCEDRIN PO) Take 2 tablets by mouth daily as needed (headache).    . Carboxymethylcellul-Glycerin 0.5-0.9 % SOLN Place 2 drops into both eyes 2 (two) times daily.    . cetirizine (ZYRTEC) 10 MG tablet Take 10 mg by mouth at bedtime.    . clobetasol cream (TEMOVATE) 0.05 % FOR DIRECTIONS ON HOW TO   TAKE THIS MEDICINE, READ   THE ENCLOSED MEDICATION    INFORMATION FORM 300 g 0  . Coenzyme Q10 (CO Q 10 PO) Take 1 capsule by mouth every morning. 500mg     . fluocinonide (LIDEX) 0.05 % external solution Apply 1 application topically 2 (two) times daily. Must establish w/new provider for continuity of care 360 mL 0  . FLUoxetine (PROZAC) 20 MG capsule TAKE 1 CAPSULE DAILY---  takes in am 90 capsule 3  . ibuprofen (ADVIL,MOTRIN) 200 MG tablet Take 200 mg by mouth every 6 (six) hours as needed. 2 tabs=400mg  per dose for muscle aches    . metoprolol succinate (TOPROL-XL) 100 MG 24 hr tablet Take 1 tablet (100 mg total) by mouth daily. 90 tablet 3  . Multiple Vitamin (MULTIVITAMIN) capsule Take 1 capsule by mouth every morning.     . nitroGLYCERIN (NITROSTAT) 0.3 MG SL tablet Place 1 tablet (0.3 mg total) under the tongue every 5 (five) minutes as needed for chest pain. 90 tablet 0  . Omega-3 Fatty  Acids (FISH OIL) 1000 MG CAPS Take 1,000 mg by mouth every morning.     . rosuvastatin (CRESTOR) 10 MG tablet Take 1 tablet (10 mg total) by mouth daily. 90 tablet 3   No current facility-administered medications on file prior to visit.     BP 138/83   Pulse 68   Temp 98.5 F (36.9 C) (Oral)  Wt 254 lb 6.4 oz (115.4 kg)   SpO2 97%   BMI 38.68 kg/m       Objective:   Physical Exam  General  Mental Status - Alert. General Appearance - Well groomed. Not in acute distress.  Skin Rashes- No Rashes.  HEENT Head- Normal. Ear Auditory Canal - Left- Normal. Right - Normal.Tympanic Membrane- Left- Normal. Right- Normal. Eye Sclera/Conjunctiva- Left- Normal. Right- Normal. Nose & Sinuses Nasal Mucosa- Left-  Boggy and Congested. Right-  Boggy and  Congested.Bilateral maxillary and frontal sinus pressure.(but no pain) Mouth & Throat Lips: Upper Lip- Normal: no dryness, cracking, pallor, cyanosis, or vesicular eruption. Lower Lip-Normal: no dryness, cracking, pallor, cyanosis or vesicular eruption. Buccal Mucosa- Bilateral- No Aphthous ulcers. Oropharynx- No Discharge or Erythema. +pnd Tonsils: Characteristics- Bilateral- No Erythema or Congestion. Size/Enlargement- Bilateral- No enlargement. Discharge- bilateral-None.  Neck Neck- Supple. No Masses.   Chest and Lung Exam Auscultation: Breath Sounds:-Clear even and unlabored.  Cardiovascular Auscultation:Rythm- Regular, rate and rhythm. Murmurs & Other Heart Sounds:Ausculatation of the heart reveal- No Murmurs.  Lymphatic Head & Neck General Head & Neck Lymphatics: Bilateral: Description- No Localized lymphadenopathy.       Assessment & Plan:  You appear to have had allergy symptoms for one month followed by sinus infection. Continue you flonase and zyrtec. We gave you DepoMedrol 40 mg im today and prescribing Augmentin antibiotic.  Your symptoms should improve. If any worse signs or symptoms please let us  know.  Follow up in 7 days or as needed   Treatment option and reasoning behind giving depo-medrol explained to pt. Pt agreed to injection.

## 2016-04-12 NOTE — Telephone Encounter (Signed)
Rx cancelled with [facility].

## 2016-05-04 ENCOUNTER — Other Ambulatory Visit: Payer: Self-pay | Admitting: Family

## 2016-07-26 ENCOUNTER — Telehealth: Payer: Self-pay | Admitting: Pulmonary Disease

## 2016-07-26 NOTE — Telephone Encounter (Signed)
Spoke with pt. He is needing an order for CPAP supplies. Advised him that we have not seen him since 2016 and he would need an appointment. He states that he was not aware that he would have to continue coming in for appointments to continue getting supplies. OV has been scheduled for 08/02/16 at 9:15am. Nothing further was needed.

## 2016-07-31 ENCOUNTER — Encounter: Payer: Self-pay | Admitting: Family

## 2016-07-31 ENCOUNTER — Ambulatory Visit (INDEPENDENT_AMBULATORY_CARE_PROVIDER_SITE_OTHER): Payer: 59 | Admitting: Family

## 2016-07-31 ENCOUNTER — Other Ambulatory Visit (INDEPENDENT_AMBULATORY_CARE_PROVIDER_SITE_OTHER): Payer: 59

## 2016-07-31 VITALS — BP 142/94 | HR 70 | Temp 98.5°F | Resp 16 | Ht 68.0 in | Wt 249.0 lb

## 2016-07-31 DIAGNOSIS — Z0000 Encounter for general adult medical examination without abnormal findings: Secondary | ICD-10-CM | POA: Diagnosis not present

## 2016-07-31 DIAGNOSIS — E6609 Other obesity due to excess calories: Secondary | ICD-10-CM

## 2016-07-31 DIAGNOSIS — I10 Essential (primary) hypertension: Secondary | ICD-10-CM

## 2016-07-31 DIAGNOSIS — Z7289 Other problems related to lifestyle: Secondary | ICD-10-CM | POA: Diagnosis not present

## 2016-07-31 DIAGNOSIS — IMO0001 Reserved for inherently not codable concepts without codable children: Secondary | ICD-10-CM

## 2016-07-31 DIAGNOSIS — E782 Mixed hyperlipidemia: Secondary | ICD-10-CM | POA: Diagnosis not present

## 2016-07-31 DIAGNOSIS — Z6837 Body mass index (BMI) 37.0-37.9, adult: Secondary | ICD-10-CM

## 2016-07-31 LAB — COMPREHENSIVE METABOLIC PANEL
ALBUMIN: 4.3 g/dL (ref 3.5–5.2)
ALK PHOS: 57 U/L (ref 39–117)
ALT: 31 U/L (ref 0–53)
AST: 23 U/L (ref 0–37)
BUN: 12 mg/dL (ref 6–23)
CALCIUM: 9.7 mg/dL (ref 8.4–10.5)
CO2: 29 mEq/L (ref 19–32)
CREATININE: 0.9 mg/dL (ref 0.40–1.50)
Chloride: 102 mEq/L (ref 96–112)
GFR: 92.24 mL/min (ref >60.00)
Glucose, Bld: 95 mg/dL (ref 70–99)
POTASSIUM: 4.4 meq/L (ref 3.5–5.1)
SODIUM: 140 meq/L (ref 135–145)
TOTAL PROTEIN: 7.1 g/dL (ref 6.0–8.3)
Total Bilirubin: 0.6 mg/dL (ref 0.2–1.2)

## 2016-07-31 LAB — CBC
HEMATOCRIT: 45.1 % (ref 39.0–52.0)
Hemoglobin: 15.2 g/dL (ref 13.0–17.0)
MCHC: 33.8 g/dL (ref 30.0–36.0)
MCV: 90.1 fl (ref 78.0–100.0)
PLATELETS: 294 10*3/uL (ref 150.0–400.0)
RBC: 5 Mil/uL (ref 4.22–5.81)
RDW: 13.8 % (ref 11.5–15.5)
WBC: 6.8 10*3/uL (ref 4.0–10.5)

## 2016-07-31 LAB — LDL CHOLESTEROL, DIRECT: Direct LDL: 79 mg/dL

## 2016-07-31 LAB — LIPID PANEL
Cholesterol: 178 mg/dL (ref 0–200)
HDL: 37.3 mg/dL — ABNORMAL LOW (ref >39.00)
NonHDL: 140.5
TRIGLYCERIDES: 399 mg/dL — AB (ref 0.0–149.0)
Total CHOL/HDL Ratio: 5
VLDL: 79.8 mg/dL — ABNORMAL HIGH (ref 0.0–40.0)

## 2016-07-31 LAB — PSA: PSA: 3.2 ng/mL (ref 0.10–4.00)

## 2016-07-31 MED ORDER — CARBOXYMETHYLCELLUL-GLYCERIN 0.5-0.9 % OP SOLN: 2.0000 [drp] | Freq: Two times a day (BID) | OPHTHALMIC | 2 refills | Status: DC

## 2016-07-31 MED ORDER — CLOBETASOL PROPIONATE 0.05 % EX CREA: TOPICAL_CREAM | CUTANEOUS | 0 refills | Status: DC

## 2016-07-31 MED ORDER — FLUOXETINE HCL 20 MG PO CAPS: ORAL_CAPSULE | ORAL | 3 refills | Status: DC

## 2016-07-31 MED ORDER — FLUOCINONIDE 0.05 % EX SOLN: 1.0000 "application " | Freq: Two times a day (BID) | CUTANEOUS | 0 refills | Status: DC

## 2016-07-31 MED ORDER — ROSUVASTATIN CALCIUM 10 MG PO TABS: 10.0000 mg | ORAL_TABLET | Freq: Every day | ORAL | 3 refills | Status: DC

## 2016-07-31 MED ORDER — ALLOPURINOL 300 MG PO TABS: ORAL_TABLET | ORAL | 3 refills | Status: DC

## 2016-07-31 MED ORDER — METOPROLOL SUCCINATE ER 100 MG PO TB24: 100.0000 mg | ORAL_TABLET | Freq: Every day | ORAL | 3 refills | Status: DC

## 2016-07-31 NOTE — Assessment & Plan Note (Signed)
1) Anticipatory Guidance: Discussed importance of wearing a seatbelt while driving and not texting while driving; changing batteries in smoke detector at least once annually; wearing suntan lotion when outside; eating a balanced and moderate diet; getting physical activity at least 30 minutes per day.  2) Immunizations / Screenings / Labs:  All immunizations are up-to-date per recommendations. Obtain hepatitis C antibody for hepatitis C screening. Obtain PSA for prostate cancer screening. All other screenings are up-to-date per recommendations. Obtain CBC, CMET, and lipid profile.    Overall well exam with risk factors for cardiovascular disease including hypertension, obesity, and hyperlipidemia. Chronic conditions appear adequately controlled through medication regimens with no adverse side effects. Recommend weight loss of 5-10% of current body weight through nutrition and physical activity. Goal is to become healthier and lose weight in 2018. Continue other healthy lifestyle behaviors and choices. Follow-up prevention exam in 1 year. Follow-up office visit pending blood work and for chronic conditions.

## 2016-07-31 NOTE — Assessment & Plan Note (Signed)
Obtain lipid profile. Continue current dosage of rosuvastatin pending lipid profile results.  

## 2016-07-31 NOTE — Progress Notes (Signed)
Subjective:    Patient ID: Charles [name], male    DOB: 1960/11/18, 56 y.o.   MRN: [id number]  Chief Complaint  Patient presents with  . CPE    fasting    HPI:  Charles [name] is a 56 y.o. male who presents today for an annual wellness visit.   1) Health Maintenance -   Diet - Averages about 2-3 meals per day consisting of a regular diet and working on portion control; Caffeine intake of decaf 1-2 cups per day; has cut soda out of nutrition.   Exercise - Working on exercise; Goal is to increase physical activity. Continues to be physical at work.    2) Preventative Exams / Immunizations:  Dental -- Up to date  Vision -- Up to date   Health Maintenance  Topic Date Due  . Hepatitis C Screening  July 04, 1960  . HIV Screening  12/14/1973  . TETANUS/TDAP  03/13/2020  . COLONOSCOPY  10/07/2023  . INFLUENZA VACCINE  Completed    Immunization History  Administered Date(s) Administered  . Hepatitis B 08/10/2009, 09/12/2009, 03/13/2010  . Influenza Split 02/25/2011, 02/18/2012  . Influenza Whole 08/25/2008, 03/21/2009, 02/19/2010  . Influenza, High Dose Seasonal PF 03/12/2014  . Influenza,inj,Quad PF,36+ Mos 02/16/2013, 02/23/2016  . Influenza-Unspecified 01/22/2015  . PPD Test 02/25/2011, 02/18/2012, 02/16/2013, 06/14/2014, 02/23/2016  . Td 03/13/2010     Allergies  Allergen Reactions  . Lidocaine Anaphylaxis    REACTION: anaphylactic shock (also he was on Neomycin sulfate)  . Neomycin Sulfate Anaphylaxis    REACTION: anaphylactic shock (also on Lidocaine  topically)  . Sulfa Drugs Cross Reactors Anaphylaxis  . Flexeril [Cyclobenzaprine] Other (See Comments)    05/26/13 urinary retention  . Hctz [Hydrochlorothiazide] Other (See Comments)    Gout   . Tramadol Other (See Comments)    05/26/13 urinary retention  . Atrovent [Ipratropium] Other (See Comments)    Caused Urinary retention (this is the nasal spray)  . Latex Itching and Rash    Skin irritation      Outpatient Medications Prior to Visit  Medication Sig Dispense Refill  . aspirin 325 MG EC tablet Take 325 mg by mouth daily.    . Aspirin-Acetaminophen-Caffeine (EXCEDRIN PO) Take 2 tablets by mouth daily as needed (headache).    . cetirizine (ZYRTEC) 10 MG tablet Take 10 mg by mouth at bedtime.    . Coenzyme Q10 (CO Q 10 PO) Take 1 capsule by mouth every morning. 500mg     . ibuprofen (ADVIL,MOTRIN) 200 MG tablet Take 200 mg by mouth every 6 (six) hours as needed. 2 tabs=400mg  per dose for muscle aches    . Multiple Vitamin (MULTIVITAMIN) capsule Take 1 capsule by mouth every morning.     . nitroGLYCERIN (NITROSTAT) 0.3 MG SL tablet Place 1 tablet (0.3 mg total) under the tongue every 5 (five) minutes as needed for chest pain. 90 tablet 0  . Omega-3 Fatty Acids (FISH OIL) 1000 MG CAPS Take 1,000 mg by mouth every morning.     Marland Kitchen allopurinol (ZYLOPRIM) 300 MG tablet TAKE 1 TABLET BY MOUTH EVERY DAY 90 tablet 3  . amoxicillin-clavulanate (AUGMENTIN) 875-125 MG tablet Take 1 tablet by mouth 2 (two) times daily. 20 tablet 0  . Carboxymethylcellul-Glycerin 0.5-0.9 % SOLN Place 2 drops into both eyes 2 (two) times daily.    . clobetasol cream (TEMOVATE) 0.05 % FOR DIRECTIONS ON HOW TO   TAKE THIS MEDICINE, READ   THE ENCLOSED MEDICATION    INFORMATION FORM 300  g 0  . fluocinonide (LIDEX) 0.05 % external solution Apply 1 application topically 2 (two) times daily. Must establish w/new provider for continuity of care 360 mL 0  . FLUoxetine (PROZAC) 20 MG capsule TAKE 1 CAPSULE DAILY---  takes in am 90 capsule 3  . metoprolol succinate (TOPROL-XL) 100 MG 24 hr tablet Take 1 tablet (100 mg total) by mouth daily. 90 tablet 3  . rosuvastatin (CRESTOR) 10 MG tablet Take 1 tablet (10 mg total) by mouth daily. 90 tablet 3   No facility-administered medications prior to visit.      Past Medical History:  Diagnosis Date  . BPH (benign prostatic hypertrophy) with urinary retention   . CAD (coronary  artery disease) 04/2008   successful PCI of the lesion in the proximal LAD using xience drug-eluting stent with improvement in central narrowing from 95% to 0%.  Dr. Juanda ChanceBrodie  . Foley catheter in place   . Gout    per pt stable is of 08-18-2014  . History of acute bronchitis    recently 07-16-2014  . History of sepsis    urosepsis--  08-13-2014 admission  . Hyperlipidemia LDL goal <100   . Hypertension   . Multilevel degenerative disc disease    cervical and lumbar  . OSA (obstructive sleep apnea)       . Psoriasis   . S/P drug eluting coronary stent placement    05-13-2008   DES X1 TO pLAD  . Sigmoid diverticulosis      Past Surgical History:  Procedure Laterality Date  . BUNIONECTOMY Right 2000  . COLONOSCOPY WITH PROPOFOL  last one 10-06-2013  . CORONARY ANGIOPLASTY WITH STENT PLACEMENT  05-13-2008    [name]   PCI w/  DES x1 to  pLAD/  ef 65%  . GREEN LIGHT LASER TURP (TRANSURETHRAL RESECTION OF PROSTATE N/A 08/23/2014   Procedure: GREEN LIGHT LASER TURP (TRANSURETHRAL RESECTION OF PROSTATE;  Surgeon: Bjorn PippinJohn Wrenn, MD;  [facility];  Service: Urology;  Laterality: N/A;  . WRIST GANGLION EXCISION Left 2001     Family History  Problem Relation Age of Onset  . Hypertension Father   . Stroke Father [id number]  . Lung cancer Mother      smoker  . Diabetes Sister   . Heart disease Brother 49     stent LAD 2012  . Colon cancer Neg Hx   . Pancreatic cancer Neg Hx   . Rectal cancer Neg Hx   . Stomach cancer Neg Hx      Social History   Social History  . Marital status: Married    [name]  . Number of children: 1  . Years of education: Bachelor   Occupational History  . Field Optician, dispensingsupport Engineer    Social History Main Topics  . Smoking status: Former Smoker    Packs/day: 0.75    Years: 14.00    Types: Cigarettes    Quit date: 05/27/1997  . Smokeless tobacco: Former NeurosurgeonUser    Types: Snuff    Quit date: 08/17/1997  . Alcohol use 1.2  oz/week    2 Cans of beer per week     Comment: occasional  . Drug use: No  . Sexual activity: Not on file   Other Topics Concern  . Not on file   Social History Narrative   [name] has 1 child   Patient is right handed   Education level is Bachelor's degree  Caffeine consumption is 1 daily      Review of Systems  Constitutional: Denies fever, chills, fatigue, or significant weight gain/loss. HENT: Head: Denies headache or neck pain Ears: Denies changes in hearing, ringing in ears, earache, drainage Nose: Denies discharge, stuffiness, itching, nosebleed, sinus pain Throat: Denies sore throat, hoarseness, dry mouth, sores, thrush Eyes: Denies loss/changes in vision, pain, redness, blurry/double vision, flashing lights Cardiovascular: Denies chest pain/discomfort, tightness, palpitations, shortness of breath with activity, difficulty lying down, swelling, sudden awakening with shortness of breath Respiratory: Denies shortness of breath, cough, sputum production, wheezing Gastrointestinal: Denies dysphasia, heartburn, change in appetite, nausea, change in bowel habits, rectal bleeding, constipation, diarrhea, yellow skin or eyes Genitourinary: Denies frequency, urgency, burning/pain, blood in urine, incontinence, change in urinary strength. Musculoskeletal: Denies muscle/joint pain, stiffness, back pain, redness or swelling of joints, trauma Skin: Denies rashes, lumps, itching, dryness, color changes, or hair/nail changes Neurological: Denies dizziness, fainting, seizures, weakness, numbness, tingling, tremor Psychiatric - Denies nervousness, stress, depression or memory loss Endocrine: Denies heat or cold intolerance, sweating, frequent urination, excessive thirst, changes in appetite Hematologic: Denies ease of bruising or bleeding     Objective:     BP (!) 142/94 (BP Location: Left Arm, Patient Position: Sitting, Cuff Size: Large)   Pulse 70   Temp  98.5 F (36.9 C) (Oral)   Resp 16   Ht 5\' 8"  (1.727 m)   Wt 249 lb (112.9 kg)   SpO2 95%   BMI 37.86 kg/m  Nursing note and vital signs reviewed.  Physical Exam  Constitutional: He is oriented to person, place, and time. He appears well-developed and well-nourished.  HENT:  Head: Normocephalic.  Right Ear: Hearing, tympanic membrane, external ear and ear canal normal.  Left Ear: Hearing, tympanic membrane, external ear and ear canal normal.  Nose: Nose normal.  Mouth/Throat: Uvula is midline, oropharynx is clear and moist and mucous membranes are normal.  Eyes: Conjunctivae and EOM are normal. Pupils are equal, round, and reactive to light.  Neck: Neck supple. No JVD present. No tracheal deviation present. No thyromegaly present.  Cardiovascular: Normal rate, regular rhythm, normal heart sounds and intact distal pulses.   Pulmonary/Chest: Effort normal and breath sounds normal.  Abdominal: Soft. Bowel sounds are normal. He exhibits no distension and no mass. There is no tenderness. There is no rebound and no guarding.  Musculoskeletal: Normal range of motion. He exhibits no edema or tenderness.  Lymphadenopathy:    He has no cervical adenopathy.  Neurological: He is alert and oriented to person, place, and time. He has normal reflexes. No cranial nerve deficit. He exhibits normal muscle tone. Coordination normal.  Skin: Skin is warm and dry.  Psychiatric: He has a normal mood and affect. His behavior is normal. Judgment and thought content normal.        Assessment & Plan:   Problem List Items Addressed This Visit      Cardiovascular and Mediastinum   Essential hypertension    Blood pressure slightly elevated today above goal 140/90 with current medication regimen and no adverse side effects. Encouraged to continue monitoring blood pressure at home and follow low-sodium diet. This will most likely improve with mild weight loss. Denies worst headache of life with no new symptoms  of end organ damage noted on physical exam. Continue current dosage of metoprolol. Continue to monitor.      Relevant Medications   rosuvastatin (CRESTOR) 10 MG tablet   metoprolol succinate (TOPROL-XL) 100 MG 24 hr tablet  Other   Hyperlipidemia    Obtain lipid profile. Continue current dosage of rosuvastatin pending lipid profile results.      Relevant Medications   rosuvastatin (CRESTOR) 10 MG tablet   metoprolol succinate (TOPROL-XL) 100 MG 24 hr tablet   Obesity    BMI of 37. Recommend weight loss of 5-10% of current body weight through nutrition and physical activity changes. Encouraged to follow a nutritional intake that is moderate, balance, and varied. Recommended calorie intake level of 1500-1800 cal per day. Increase physical activity to 2-3 times per week of a minimum of 30 minutes of moderate level activity. Increase as tolerated. Consider weight loss program such as Doylene Bode, Weight Watchers, or Nutrisystem. Continue to monitor.      Routine general medical examination at a health care facility - Primary    1) Anticipatory Guidance: Discussed importance of wearing a seatbelt while driving and not texting while driving; changing batteries in smoke detector at least once annually; wearing suntan lotion when outside; eating a balanced and moderate diet; getting physical activity at least 30 minutes per day.  2) Immunizations / Screenings / Labs:  All immunizations are up-to-date per recommendations. Obtain hepatitis C antibody for hepatitis C screening. Obtain PSA for prostate cancer screening. All other screenings are up-to-date per recommendations. Obtain CBC, CMET, and lipid profile.    Overall well exam with risk factors for cardiovascular disease including hypertension, obesity, and hyperlipidemia. Chronic conditions appear adequately controlled through medication regimens with no adverse side effects. Recommend weight loss of 5-10% of current body weight through  nutrition and physical activity. Goal is to become healthier and lose weight in 2018. Continue other healthy lifestyle behaviors and choices. Follow-up prevention exam in 1 year. Follow-up office visit pending blood work and for chronic conditions.      Relevant Orders   CBC (Completed)   Comprehensive metabolic panel (Completed)   Lipid panel (Completed)   PSA (Completed)    Other Visit Diagnoses    Other problems related to lifestyle       Relevant Orders   Hepatitis C antibody       I have discontinued Charles [name]'s amoxicillin-clavulanate. I have also changed his fluocinonide and clobetasol cream. Additionally, I am having him maintain his Fish Oil, multivitamin, aspirin, ibuprofen, Coenzyme Q10 (CO Q 10 PO), Aspirin-Acetaminophen-Caffeine (EXCEDRIN PO), cetirizine, nitroGLYCERIN, rosuvastatin, metoprolol succinate, FLUoxetine, Carboxymethylcellul-Glycerin, and allopurinol.   Meds ordered this encounter  Medications  . rosuvastatin (CRESTOR) 10 MG tablet    Sig: Take 1 tablet (10 mg total) by mouth daily.    Dispense:  90 tablet    Refill:  3    Order Specific Question:   Supervising Provider    Answer:   Hillard Danker A [[id number]]  . metoprolol succinate (TOPROL-XL) 100 MG 24 hr tablet    Sig: Take 1 tablet (100 mg total) by mouth daily.    Dispense:  90 tablet    Refill:  3    Order Specific Question:   Supervising Provider    Answer:   Hillard Danker A [[id number]]  . FLUoxetine (PROZAC) 20 MG capsule    Sig: TAKE 1 CAPSULE DAILY---  takes in am    Dispense:  90 capsule    Refill:  3    Order Specific Question:   Supervising Provider    Answer:   Hillard Danker A [[id number]]  . fluocinonide (LIDEX) 0.05 % external solution    Sig: Apply 1 application topically 2 (two) times daily.  Dispense:  360 mL    Refill:  0    Order Specific Question:   Supervising Provider    Answer:   Hillard Danker A [[id number]]  . clobetasol cream (TEMOVATE) 0.05 %    Sig: APPLY 1  APPLICATION TOPICALLY TWO TIMES A DAY AS NEEDED    Dispense:  300 g    Refill:  0    Order Specific Question:   Supervising Provider    Answer:   Hillard Danker A [[id number]]  . Carboxymethylcellul-Glycerin 0.5-0.9 % SOLN    Sig: Place 2 drops into both eyes 2 (two) times daily.    Dispense:  15 mL    Refill:  2    Order Specific Question:   Supervising Provider    Answer:   Hillard Danker A [[id number]]  . allopurinol (ZYLOPRIM) 300 MG tablet    Sig: TAKE 1 TABLET BY MOUTH EVERY DAY    Dispense:  90 tablet    Refill:  3    Order Specific Question:   Supervising Provider    Answer:   Hillard Danker A [[id number]]     Follow-up: Return in about 6 months (around 01/31/2017), or if symptoms worsen or fail to improve.   Jeanine Luz, FNP

## 2016-07-31 NOTE — Assessment & Plan Note (Signed)
Blood pressure slightly elevated today above goal 140/90 with current medication regimen and no adverse side effects. Encouraged to continue monitoring blood pressure at home and follow low-sodium diet. This will most likely improve with mild weight loss. Denies worst headache of life with no new symptoms of end organ damage noted on physical exam. Continue current dosage of metoprolol. Continue to monitor.

## 2016-07-31 NOTE — Assessment & Plan Note (Signed)
BMI of 37. Recommend weight loss of 5-10% of current body weight through nutrition and physical activity changes. Encouraged to follow a nutritional intake that is moderate, balance, and varied. Recommended calorie intake level of 1500-1800 cal per day. Increase physical activity to 2-3 times per week of a minimum of 30 minutes of moderate level activity. Increase as tolerated. Consider weight loss program such as Doylene BodeJenny Craig, Weight Watchers, or Nutrisystem. Continue to monitor.

## 2016-07-31 NOTE — Patient Instructions (Addendum)
Thank you for choosing [facility].  SUMMARY AND INSTRUCTIONS:  Please continue to take her medications as prescribed.  Work on increasing physical activity and continue with nutritional changes to emphasize fruits and vegetables.  Medication:  Your prescription(s) have been submitted to your pharmacy or been printed and provided for you. Please take as directed and contact our office if you believe you are having problem(s) with the medication(s) or have any questions.  Labs:  Please stop by the lab on the lower level of the building for your blood work. Your results will be released to MyChart (or called to you) after review, usually within 72 hours after test completion. If any changes need to be made, you will be notified at that same time.  1.) The lab is open from 7:30am to 5:30 pm Monday-Friday 2.) No appointment is necessary 3.) Fasting (if needed) is 6-8 hours after food and drink; black coffee and water are okay   Follow up:  If your symptoms worsen or fail to improve, please contact our office for further instruction, or in case of emergency go directly to the emergency room at the closest medical facility.     Health Maintenance, Male A healthy lifestyle and preventive care is important for your health and wellness. Ask your health care provider about what schedule of regular examinations is right for you. What should I know about weight and diet?  Eat a Healthy Diet  Eat plenty of vegetables, fruits, whole grains, low-fat dairy products, and lean protein.  Do not eat a lot of foods high in solid fats, added sugars, or salt. Maintain a Healthy Weight  Regular exercise can help you achieve or maintain a healthy weight. You should:  Do at least 150 minutes of exercise each week. The exercise should increase your heart rate and make you sweat (moderate-intensity exercise).  Do strength-training exercises at least twice a week. Watch Your Levels of Cholesterol  and Blood Lipids  Have your blood tested for lipids and cholesterol every 5 years starting at 58 years of age. If you are at high risk for heart disease, you should start having your blood tested when you are 58 years old. You may need to have your cholesterol levels checked more often if:  Your lipid or cholesterol levels are high.  You are older than 58 years of age.  You are at high risk for heart disease. What should I know about cancer screening? Many types of cancers can be detected early and may often be prevented. Lung Cancer  You should be screened every year for lung cancer if:  You are a current smoker who has smoked for at least 30 years.  You are a former smoker who has quit within the past 15 years.  Talk to your health care provider about your screening options, when you should start screening, and how often you should be screened. Colorectal Cancer  Routine colorectal cancer screening usually begins at 58 years of age and should be repeated every 5-10 years until you are 58 years old. You may need to be screened more often if early forms of precancerous polyps or small growths are found. Your health care provider may recommend screening at an earlier age if you have risk factors for colon cancer.  Your health care provider may recommend using home test kits to check for hidden blood in the stool.  A small camera at the end of a tube can be used to examine your colon (sigmoidoscopy  or colonoscopy). This checks for the earliest forms of colorectal cancer. Prostate and Testicular Cancer  Depending on your age and overall health, your health care provider may do certain tests to screen for prostate and testicular cancer.  Talk to your health care provider about any symptoms or concerns you have about testicular or prostate cancer. Skin Cancer  Check your skin from head to toe regularly.  Tell your health care provider about any new moles or changes in moles, especially  if:  There is a change in a mole's size, shape, or color.  You have a mole that is larger than a pencil eraser.  Always use sunscreen. Apply sunscreen liberally and repeat throughout the day.  Protect yourself by wearing long sleeves, pants, a wide-brimmed hat, and sunglasses when outside. What should I know about heart disease, diabetes, and high blood pressure?  If you are 57-1 years of age, have your blood pressure checked every 3-5 years. If you are 52 years of age or older, have your blood pressure checked every year. You should have your blood pressure measured twice-once when you are at a hospital or clinic, and once when you are not at a hospital or clinic. Record the average of the two measurements. To check your blood pressure when you are not at a hospital or clinic, you can use:  An automated blood pressure machine at a pharmacy.  A home blood pressure monitor.  Talk to your health care provider about your target blood pressure.  If you are between 26-48 years old, ask your health care provider if you should take aspirin to prevent heart disease.  Have regular diabetes screenings by checking your fasting blood sugar level.  If you are at a normal weight and have a low risk for diabetes, have this test once every three years after the age of 43.  If you are overweight and have a high risk for diabetes, consider being tested at a younger age or more often.  A one-time screening for abdominal aortic aneurysm (AAA) by ultrasound is recommended for men aged 65-75 years who are current or former smokers. What should I know about preventing infection? Hepatitis B  If you have a higher risk for hepatitis B, you should be screened for this virus. Talk with your health care provider to find out if you are at risk for hepatitis B infection. Hepatitis C  Blood testing is recommended for:  Everyone born from [birth year] through 1967.  Anyone with known risk factors for hepatitis  C. Sexually Transmitted Diseases (STDs)  You should be screened each year for STDs including gonorrhea and chlamydia if:  You are sexually active and are younger than 58 years of age.  You are older than 58 years of age and your health care provider tells you that you are at risk for this type of infection.  Your sexual activity has changed since you were last screened and you are at an increased risk for chlamydia or gonorrhea. Ask your health care provider if you are at risk.  Talk with your health care provider about whether you are at high risk of being infected with HIV. Your health care provider may recommend a prescription medicine to help prevent HIV infection. What else can I do?  Schedule regular health, dental, and eye exams.  Stay current with your vaccines (immunizations).  Do not use any tobacco products, such as cigarettes, chewing tobacco, and e-cigarettes. If you need help quitting, ask your health care provider.  Limit alcohol intake to no more than 2 drinks per day. One drink equals 12 ounces of beer, 5 ounces of wine, or 1 ounces of hard liquor.  Do not use street drugs.  Do not share needles.  Ask your health care provider for help if you need support or information about quitting drugs.  Tell your health care provider if you often feel depressed.  Tell your health care provider if you have ever been abused or do not feel safe at home. This information is not intended to replace advice given to you by your health care provider. Make sure you discuss any questions you have with your health care provider. Document Released: 11/09/2007 Document Revised: 01/10/2016 Document Reviewed: 02/14/2015 Elsevier Interactive Patient Education  2017 [employer].

## 2016-08-01 ENCOUNTER — Encounter: Payer: Self-pay | Admitting: Family

## 2016-08-01 LAB — HEPATITIS C ANTIBODY: HCV AB: NEGATIVE

## 2016-08-01 MED ORDER — ROSUVASTATIN CALCIUM 20 MG PO TABS: 20.0000 mg | ORAL_TABLET | Freq: Every day | ORAL | 0 refills | Status: DC

## 2016-08-02 ENCOUNTER — Encounter: Payer: Self-pay | Admitting: Adult Health

## 2016-08-02 ENCOUNTER — Ambulatory Visit (INDEPENDENT_AMBULATORY_CARE_PROVIDER_SITE_OTHER): Payer: 59 | Admitting: Adult Health

## 2016-08-02 DIAGNOSIS — G4733 Obstructive sleep apnea (adult) (pediatric): Secondary | ICD-10-CM

## 2016-08-02 NOTE — Progress Notes (Signed)
I have reviewed and agree with assessment/plan.  Marte Celani, MD [facility] 08/02/2016, 4:23 PM Pager:  [phone]  

## 2016-08-02 NOTE — Patient Instructions (Addendum)
Keep up the good work. Continue on C Pap at bedtime Remain active with healthy lifestyle Do not drive a sleepy Follow-up in one year with Dr. Craige CottaSood and as needed

## 2016-08-02 NOTE — Addendum Note (Signed)
Addended by: Boone MasterJONES, Treston Coker E on: 08/02/2016 05:02 PM   Modules accepted: Orders

## 2016-08-02 NOTE — Progress Notes (Signed)
@Patient  ID: Charles [name], male    DOB: 10-17-1960, 56 y.o.   MRN: [id number]  Chief Complaint  Patient presents with  . Follow-up    Referring provider: Veryl Speak, FNP  HPI: 56 year old male followed for severe sleep apnea on C Pap at bedtime  HST 02/07/15 >> AHI 53.2, SaO2 low 75%.  08/02/2016 Follow up : OSA  Patient returns for follow-up for sleep apnea. Patient was found to have severe sleep apnea on home sleep study in September 2016. He was started on C Pap at bedtime. Patient unfortunately did not return for follow-up. Patient says he is doing well on C Pap. Wears CPAP each for for about 8 hours. He feels rested. No sign daytime sleepiness. Feels more alert.  Download shows excellent compliance , AHI 2.5. avg usage 9 hr . Min leaks.  Travels a lot , bought [facility] Mini CPAP to take with him.    Allergies  Allergen Reactions  . Lidocaine Anaphylaxis    REACTION: anaphylactic shock (also he was on Neomycin sulfate)  . Neomycin Sulfate Anaphylaxis    REACTION: anaphylactic shock (also on Lidocaine  topically)  . Sulfa Drugs Cross Reactors Anaphylaxis  . Flexeril [Cyclobenzaprine] Other (See Comments)    05/26/13 urinary retention  . Hctz [Hydrochlorothiazide] Other (See Comments)    Gout   . Tramadol Other (See Comments)    05/26/13 urinary retention  . Atrovent [Ipratropium] Other (See Comments)    Caused Urinary retention (this is the nasal spray)  . Latex Itching and Rash    Skin irritation    Immunization History  Administered Date(s) Administered  . Hepatitis B 08/10/2009, 09/12/2009, 03/13/2010  . Influenza Split 02/25/2011, 02/18/2012  . Influenza Whole 08/25/2008, 03/21/2009, 02/19/2010  . Influenza, High Dose Seasonal PF 03/12/2014  . Influenza,inj,Quad PF,36+ Mos 02/16/2013, 02/23/2016  . Influenza-Unspecified 01/22/2015  . PPD Test 02/25/2011, 02/18/2012, 02/16/2013, 06/14/2014, 02/23/2016  . Td 03/13/2010    Past Medical History:    Diagnosis Date  . BPH (benign prostatic hypertrophy) with urinary retention   . CAD (coronary artery disease) 04/2008   successful PCI of the lesion in the proximal LAD using xience drug-eluting stent with improvement in central narrowing from 95% to 0%.  Dr. Juanda Chance  . Foley catheter in place   . Gout    per pt stable is of 08-18-2014  . History of acute bronchitis    recently 07-16-2014  . History of sepsis    urosepsis--  08-13-2014 admission  . Hyperlipidemia LDL goal <100   . Hypertension   . Multilevel degenerative disc disease    cervical and lumbar  . OSA (obstructive sleep apnea)       . Psoriasis   . S/P drug eluting coronary stent placement    05-13-2008   DES X1 TO pLAD  . Sigmoid diverticulosis     Tobacco History: History  Smoking Status  . Former Smoker  . Packs/day: 0.75  . Years: 14.00  . Types: Cigarettes  . Quit date: 05/27/1997  Smokeless Tobacco  . Former Neurosurgeon  . Types: Snuff  . Quit date: 08/17/1997   Counseling given: Not Answered   Outpatient Encounter Prescriptions as of 08/02/2016  Medication Sig  . allopurinol (ZYLOPRIM) 300 MG tablet TAKE 1 TABLET BY MOUTH EVERY DAY  . aspirin 325 MG EC tablet Take 325 mg by mouth daily.  . Aspirin-Acetaminophen-Caffeine (EXCEDRIN PO) Take 2 tablets by mouth daily as needed (headache).  . Carboxymethylcellul-Glycerin 0.5-0.9 % SOLN  Place 2 drops into both eyes 2 (two) times daily.  . cetirizine (ZYRTEC) 10 MG tablet Take 10 mg by mouth at bedtime.  . clobetasol cream (TEMOVATE) 0.05 % APPLY 1 APPLICATION TOPICALLY TWO TIMES A DAY AS NEEDED  . Coenzyme Q10 (CO Q 10 PO) Take 1 capsule by mouth every morning. 500mg   . fluocinonide (LIDEX) 0.05 % external solution Apply 1 application topically 2 (two) times daily.  Marland Kitchen. FLUoxetine (PROZAC) 20 MG capsule TAKE 1 CAPSULE DAILY---  takes in am  . ibuprofen (ADVIL,MOTRIN) 200 MG tablet Take 200 mg by mouth every 6 (six) hours as needed. 2 tabs=400mg  per dose for muscle  aches  . metoprolol succinate (TOPROL-XL) 100 MG 24 hr tablet Take 1 tablet (100 mg total) by mouth daily.  . Multiple Vitamin (MULTIVITAMIN) capsule Take 1 capsule by mouth every morning.   . nitroGLYCERIN (NITROSTAT) 0.3 MG SL tablet Place 1 tablet (0.3 mg total) under the tongue every 5 (five) minutes as needed for chest pain.  . Omega-3 Fatty Acids (FISH OIL) 1000 MG CAPS Take 1,000 mg by mouth every morning.   . rosuvastatin (CRESTOR) 20 MG tablet Take 1 tablet (20 mg total) by mouth daily.  . [DISCONTINUED] rosuvastatin (CRESTOR) 10 MG tablet Take 1 tablet (10 mg total) by mouth daily.   No facility-administered encounter medications on file as of 08/02/2016.      Review of Systems  Constitutional:   No  weight loss, night sweats,  Fevers, chills, fatigue, or  lassitude.  HEENT:   No headaches,  Difficulty swallowing,  Tooth/dental problems, or  Sore throat,                No sneezing, itching, ear ache, nasal congestion, post nasal drip,   CV:  No chest pain,  Orthopnea, PND, swelling in lower extremities, anasarca, dizziness, palpitations, syncope.   GI  No heartburn, indigestion, abdominal pain, nausea, vomiting, diarrhea, change in bowel habits, loss of appetite, bloody stools.   Resp: No shortness of breath with exertion or at rest.  No excess mucus, no productive cough,  No non-productive cough,  No coughing up of blood.  No change in color of mucus.  No wheezing.  No chest wall deformity  Skin: no rash or lesions.  GU: no dysuria, change in color of urine, no urgency or frequency.  No flank pain, no hematuria   MS:  No joint pain or swelling.  No decreased range of motion.  No back pain.    Physical Exam  BP 136/72 (BP Location: Left Arm, Cuff Size: Normal)   Pulse 76   Ht 5\' 8"  (1.727 m)   Wt 253 lb 12.8 oz (115.1 kg)   SpO2 96%   BMI 38.59 kg/m   GEN: A/Ox3; pleasant , NAD, obese   HEENT:  [place]/AT,  EACs-clear, TMs-wnl, NOSE-clear, THROAT-clear, no lesions, no  postnasal drip or exudate noted. Class 3 MP airway   NECK:  Supple w/ fair ROM; no JVD; normal carotid impulses w/o bruits; no thyromegaly or nodules palpated; no lymphadenopathy.    RESP  Clear  P & A; w/o, wheezes/ rales/ or rhonchi. no accessory muscle use, no dullness to percussion  CARD:  RRR, no m/r/g, no peripheral edema, pulses intact, no cyanosis or clubbing.  GI:   Soft & nt; nml bowel sounds; no organomegaly or masses detected.   Musco: Warm bil, no deformities or joint swelling noted.   Neuro: alert, no focal deficits noted.  Skin: Warm, no lesions or rashes    Lab Results:  CBC    Component Value Date/Time   WBC 6.8 07/31/2016 0923   RBC 5.00 07/31/2016 0923   HGB 15.2 07/31/2016 0923   HCT 45.1 07/31/2016 0923   PLT 294.0 07/31/2016 0923   MCV 90.1 07/31/2016 0923   MCH 30.3 08/16/2014 0525   MCHC 33.8 07/31/2016 0923   RDW 13.8 07/31/2016 0923   LYMPHSABS 0.7 08/13/2014 2200   MONOABS 0.3 08/13/2014 2200   EOSABS 0.2 08/13/2014 2200   BASOSABS 0.0 08/13/2014 2200    BMET    Component Value Date/Time   NA 140 07/31/2016 0923   K 4.4 07/31/2016 0923   CL 102 07/31/2016 0923   CO2 29 07/31/2016 0923   GLUCOSE 95 07/31/2016 0923   BUN 12 07/31/2016 0923   CREATININE 0.90 07/31/2016 0923   CALCIUM 9.7 07/31/2016 0923   GFRNONAA >90 08/16/2014 0525   GFRAA >90 08/16/2014 0525    BNP No results found for: BNP  ProBNP No results found for: PROBNP  Imaging: No results found.   Assessment & Plan:   OSA (obstructive sleep apnea) Well controlled on CPAP   Plan  Patient Instructions  Keep up the good work. Continue on C Pap at bedtime Remain active with healthy lifestyle Do not drive a sleepy Follow-up in one year with Dr. Craige Cotta and as needed    Obesity Wt loss      Rubye Oaks, NP 08/02/2016

## 2016-08-02 NOTE — Assessment & Plan Note (Signed)
Wt loss  

## 2016-08-02 NOTE — Assessment & Plan Note (Signed)
Well controlled on CPAP   Plan  Patient Instructions  Keep up the good work. Continue on C Pap at bedtime Remain active with healthy lifestyle Do not drive a sleepy Follow-up in one year with Dr. Craige CottaSood and as needed

## 2016-11-26 NOTE — Telephone Encounter (Signed)
error 

## 2017-01-03 ENCOUNTER — Other Ambulatory Visit: Payer: Self-pay | Admitting: Family

## 2017-02-26 ENCOUNTER — Other Ambulatory Visit: Payer: Self-pay | Admitting: Family

## 2017-02-26 ENCOUNTER — Telehealth: Payer: Self-pay | Admitting: Family

## 2017-02-26 NOTE — Telephone Encounter (Signed)
Caller name: Relation to WU:JWJX Call back number:[phone] Pharmacy:  Reason for call: pt is needing tb test/flu shot/Pertussis for his job, pt is wanting to have it done at the [place] upon approval. Please advise

## 2017-02-27 ENCOUNTER — Encounter: Payer: Self-pay | Admitting: Family

## 2017-02-27 NOTE — Telephone Encounter (Signed)
Error/gd °

## 2017-03-02 NOTE — Telephone Encounter (Signed)
Ok to complete at his convenience.

## 2017-03-03 ENCOUNTER — Encounter: Payer: Self-pay | Admitting: Medical

## 2017-03-03 ENCOUNTER — Telehealth: Payer: Self-pay | Admitting: Medical

## 2017-03-03 NOTE — Telephone Encounter (Signed)
Pt wants flu vaccine, tdap and ppd. Our records state that his tetanus/tdap due in 2021. But check because if you look into hx looks like may have been TD.   If he had pertussis vaccine  it appears he would not need but vaccine but double check. He can schedule nurse visit. I need to talk you about this pt/orders to place.

## 2017-03-06 NOTE — Telephone Encounter (Signed)
Left pt a message to call back. 

## 2017-03-06 NOTE — Telephone Encounter (Signed)
I think this is for his employment. He should be made aware that we have combination vaccine that is tdap. Document he is aware of combination vaccine and let him know insurance may not cover but if he wants after advisement can give.

## 2017-03-06 NOTE — Telephone Encounter (Signed)
I see that pt had td in 2011 not a tdap.I'm not sure if insurance will pay for pt to get tdap since pt has had td in 2011

## 2017-03-10 NOTE — Telephone Encounter (Signed)
Left pt a message to call 

## 2017-03-18 ENCOUNTER — Ambulatory Visit: Payer: 59

## 2017-06-20 ENCOUNTER — Other Ambulatory Visit: Payer: Self-pay | Admitting: Emergency Medicine

## 2017-06-20 MED ORDER — ALLOPURINOL 300 MG PO TABS: ORAL_TABLET | ORAL | 3 refills | Status: DC

## 2017-06-20 NOTE — Telephone Encounter (Signed)
Pt has new pt appt coming up on 09/22/17. Requesting refill on Allopurinol. Are you ok with refilling this before seeing the pt for his new pt appt? Please advise.

## 2017-06-23 ENCOUNTER — Encounter: Payer: Self-pay | Admitting: Family Medicine

## 2017-06-24 MED ORDER — ROSUVASTATIN CALCIUM 20 MG PO TABS: 20.0000 mg | ORAL_TABLET | Freq: Every day | ORAL | 1 refills | Status: DC

## 2017-06-24 MED ORDER — METOPROLOL SUCCINATE ER 100 MG PO TB24: 100.0000 mg | ORAL_TABLET | Freq: Every day | ORAL | 1 refills | Status: DC

## 2017-06-24 MED ORDER — FLUOXETINE HCL 20 MG PO CAPS: ORAL_CAPSULE | ORAL | 3 refills | Status: DC

## 2017-09-20 NOTE — Progress Notes (Addendum)
[facility] [address] [place], [place] [address] [phone]  Date:  09/22/2017   Name:  Charles [name]   DOB:  22-Feb-1961   MRN:  [id number]  PCP:  Pearline Cables, MD    Chief Complaint: Annual Exam   History of Present Illness:  Charles [name] is a 57 y.o. very pleasant male patient who presents with the following:  Here as a new patient to establish care Former pt of Marcos Eke who has moved to pulmonary- last visit with him about one year ago.  He is a former pt of Dr. Alwyn Ren who retired.   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  His job is active- however he does have to do a fair amount of driving  He also has a some gym equipment at home and tries to exercise up to 4x a week; however sometimes he does not get a whole lot of formal exercise due to traveling  BP Readings from Last 3 Encounters:  09/22/17 (!) 160/100  08/02/16 136/72  07/31/16 (!) 142/94   Wt Readings from Last 3 Encounters:  09/22/17 248 lb (112.5 kg)  08/02/16 253 lb 12.8 oz (115.1 kg)  07/31/16 249 lb (112.9 kg)   Flu is UTD Colon 2015 Tetanus 2011 He is fasting this am for labs   He notes that he is happily married, will be 30 years this year, but there are some financial pressures in his life They have one son who lives in [place], he was up for a visit recently They did lost 2 dogs in the last couple of years  During his free time he  likes to relax with his wife and perhaps visit family   Back in 2016 he had an episode when he took some cold medication and developed urinary retention, then had to have a foley and developed sepsis - he has recovered fully from this incident  He did have prostate surgery- TURP- in 2016- "greenlight" procedure and he is now urinating ok- will check PSA for him today   He does check his BP at home and it has been well controlled .  He brings with him a record of numerous BP readings, all generally 140/90 or less   Lab Results  Component Value Date   PSA 3.20 07/31/2016   PSA 3.02 06/13/2014   PSA 2.29 05/08/2012    Patient Active Problem List   Diagnosis Date Noted  . Routine general medical examination at a health care facility 08/07/2015  . Left calcaneal bursitis 12/14/2014  . 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 Allergic reaction 10/14/2014  . Achilles tendinosis 10/05/2014  . OSA (obstructive sleep apnea) 08/18/2014  . Urinary tract infectious disease 08/14/2014  . Hypotension 08/14/2014  . Hyperlipidemia LDL goal <100 08/14/2014  . Gout 08/14/2014  . Obesity 06/02/2013  .  Abnormal MRI, spine 05/09/2013  . H/O urinary retention 05/09/2013  . Psoriasis 05/08/2012  . CORONARY ATHEROSCLEROSIS, NATIVE VESSEL 08/03/2008  . Hyperlipidemia 11/10/2007  . History of gout 11/10/2007  . Essential hypertension 11/10/2007  . G E R D 01/06/2007    Past Medical History:  Diagnosis Date  . BPH (benign prostatic hypertrophy) with urinary retention   . CAD (coronary artery disease) 04/2008   successful PCI of the lesion in the proximal LAD using xience drug-eluting stent with improvement in central narrowing from 95% to 0%.  Dr. Juanda Chance  . Foley catheter in place   . Gout    per pt stable is of 08-18-2014  . History of acute bronchitis    recently 07-16-2014  . History of sepsis    urosepsis--  08-13-2014 admission  . Hyperlipidemia LDL goal <100   . Hypertension    . Multilevel degenerative disc disease    cervical and lumbar  . OSA (obstructive sleep apnea)       . Psoriasis   . S/P drug eluting coronary stent placement    05-13-2008   DES X1 TO pLAD  . Sigmoid diverticulosis     Past Surgical History:  Procedure Laterality Date  . BUNIONECTOMY Right 2000  . COLONOSCOPY WITH PROPOFOL  last one 10-06-2013  . CORONARY ANGIOPLASTY WITH STENT PLACEMENT  05-13-2008    Dr Charlies Constable   PCI w/  DES x1 to  pLAD/  ef 65%  . GREEN LIGHT LASER TURP (TRANSURETHRAL RESECTION OF PROSTATE N/A 08/23/2014   Procedure: GREEN LIGHT LASER TURP (TRANSURETHRAL RESECTION OF PROSTATE;  Surgeon: Bjorn Pippin, MD;  Location: [facility];  Service: Urology;  Laterality: N/A;  . WRIST GANGLION EXCISION Left 2001    Social History   Tobacco Use  . Smoking status: Former Smoker    Packs/day: 0.75    Years: 14.00    Pack years: 10.50    Types: Cigarettes    Last attempt to quit: 05/27/1997    Years since quitting: 20.3  . Smokeless tobacco: Former Neurosurgeon    Types: Snuff    Quit date: 08/17/1997  Substance Use Topics  . Alcohol use: Yes    Alcohol/week: 1.2 oz    Types: 2 Cans of beer per week    Comment: occasional  . Drug use: No    Family History  Problem Relation Age of Onset  . Hypertension Father   . Stroke Father 82  . Lung cancer Mother         smoker  . Diabetes Sister   . Heart disease Brother 49        stent LAD 2012  . Colon cancer Neg Hx   . Pancreatic cancer Neg Hx   . Rectal cancer Neg Hx   . Stomach cancer Neg Hx     Allergies  Allergen Reactions  . Lidocaine Anaphylaxis    REACTION: anaphylactic shock (also he was on Neomycin sulfate)  . Neomycin Sulfate Anaphylaxis    REACTION: anaphylactic shock (also on Lidocaine  topically)  . Sulfa Antibiotics Anaphylaxis  . Sulfa Drugs Cross Reactors Anaphylaxis  . Flexeril [Cyclobenzaprine] Other (See Comments)    05/26/13 urinary retention  . Hctz [Hydrochlorothiazide]  Other (See Comments)    Gout   . Tramadol Other (See Comments)    05/26/13 urinary retention  . Atrovent [Ipratropium] Other (See Comments)    Caused Urinary retention (this is the nasal spray)  . Latex Itching and Rash  Skin irritation    Medication list has been reviewed and updated.  Current Outpatient Medications on File Prior to Visit  Medication Sig Dispense Refill  . allopurinol (ZYLOPRIM) 300 MG tablet TAKE 1 TABLET BY MOUTH EVERY DAY 90 tablet 3  . aspirin 325 MG EC tablet Take 325 mg by mouth daily.    . Aspirin-Acetaminophen-Caffeine (EXCEDRIN PO) Take 2 tablets by mouth daily as needed (headache).    . Carboxymethylcellul-Glycerin 0.5-0.9 % SOLN Place 2 drops into both eyes 2 (two) times daily. 15 mL 2  . cetirizine (ZYRTEC) 10 MG tablet Take 10 mg by mouth at bedtime.    . clobetasol cream (TEMOVATE) 0.05 % APPLY 1 APPLICATION TOPICALLY TWO TIMES A DAY AS NEEDED 300 g 0  . Coenzyme Q10 (CO Q 10 PO) Take 1 capsule by mouth every morning.     . fluocinonide (LIDEX) 0.05 % external solution Apply 1 application topically 2 (two) times daily. 360 mL 0  . FLUoxetine (PROZAC) 20 MG capsule TAKE 1 CAPSULE DAILY---  takes in am 90 capsule 3  . ibuprofen (ADVIL,MOTRIN) 200 MG tablet Take 200 mg by mouth every 6 (six) hours as needed. 2 tabs=400mg  per dose for muscle aches    . metoprolol succinate (TOPROL-XL) 100 MG 24 hr tablet Take 1 tablet (100 mg total) by mouth daily. 90 tablet 1  . Multiple Vitamin (MULTIVITAMIN) capsule Take 1 capsule by mouth every morning.     . nitroGLYCERIN (NITROSTAT) 0.3 MG SL tablet Place 1 tablet (0.3 mg total) under the tongue every 5 (five) minutes as needed for chest pain. 90 tablet 0  . Omega-3 Fatty Acids (FISH OIL) 1000 MG CAPS Take 1,000 mg by mouth every morning.     . rosuvastatin (CRESTOR) 20 MG tablet Take 1 tablet (20 mg total) by mouth daily. 90 tablet 1   No current facility-administered medications on file prior to visit.      Review of Systems:  As per HPI- otherwise negative. Sleeping well, no fever or chills, no CP or SOB He has diffuse psoriasis, mostly on his back and scalp    Physical Examination: Vitals:   09/22/17 0836  BP: (!) 160/100  Pulse: 64  Resp: 16  SpO2: 95%   Vitals:   09/22/17 0836  Weight: 248 lb (112.5 kg)  Height:  (1.727 m)   Body mass index is 37.71 kg/m. Ideal Body Weight: Weight in (lb) to have BMI = 25: 164.1  GEN: WDWN, NAD, Non-toxic, A & O x 3, obese, otherwise looks well  HEENT: Atraumatic, Normocephalic. Neck supple. No masses, No LAD.  Bilateral TM wnl, oropharynx normal.  PEERL,EOMI.   Ears and Nose: No external deformity. CV: RRR, No M/G/R. No JVD. No thrill. No extra heart sounds. PULM: CTA B, no wheezes, crackles, rhonchi. No retractions. No resp. distress. No accessory muscle use. ABD: S, NT, ND EXTR: No c/c/e NEURO Normal gait.  PSYCH: Normally interactive. Conversant. Not depressed or anxious appearing.  Calm demeanor.    Assessment and Plan: Physical exam  Mixed hyperlipidemia - Plan: Lipid panel, rosuvastatin (CRESTOR) 20 MG tablet  Essential hypertension - Plan: CBC, metoprolol succinate (TOPROL-XL) 100 MG 24 hr tablet  Psoriasis - Plan: clobetasol cream (TEMOVATE) 0.05 %, fluocinonide (LIDEX) 0.05 % external solution, fluocinonide cream (LIDEX) 0.05 %  OSA (obstructive sleep apnea)  Screening for prostate cancer - Plan: PSA  Screening for diabetes mellitus - Plan: Comprehensive metabolic panel, Hemoglobin A1c  Coronary artery disease involving native  heart without angina pectoris, unspecified vessel or lesion type - Plan: nitroGLYCERIN (NITROSTAT) 0.3 MG SL tablet  CPE today Labs pending as above Refilled oral meds and topicals for his psoriasis Refilled his nitro- he has never needed to use and his bottle is ancient  Will plan further follow- up pending labs.   Signed Abbe Amsterdam, MD  Received his labs, message to  pt  Blood count is normal Metabolic profile is normal Hemoglobin A1c (average blood sugar over the previous 3 months) is in the pre-diabetes range.  This means that you may be at higher risk of developing diabetes later on Your cholesterol is ok except for high triglycerides.  Your cholesterol medication can help some with this, but weight loss and exercise will really help to drive your triglycerides down - and also will help prevent onset of diabetes.  Please work on this as best you can, and let's plan to visit and recheck your lipids and A1c in 6 months  I am a bit concerned about your PSA- it has gone up a point since last year.  This may certainly be due to your known prostate enlargement issue.  However, I would suggest that we also have you follow-up with Dr. Annabell Howells, your urologist.  Would you be able to give him a call and schedule?  Or we can do this for you if you prefer  Take care and let me know if any questions!  Results for orders placed or performed in visit on 09/22/17  CBC  Result Value Ref Range   WBC 6.5 4.0 - 10.5 K/uL   RBC 5.05 4.22 - 5.81 Mil/uL   Platelets 280.0 150.0 - 400.0 K/uL   Hemoglobin 15.5 13.0 - 17.0 g/dL   HCT 69.6 29.5 - 28.4 %   MCV 92.7 78.0 - 100.0 fl   MCHC 33.1 30.0 - 36.0 g/dL   RDW 13.2 44.0 - 10.2 %  Comprehensive metabolic panel  Result Value Ref Range   Sodium 143 135 - 145 mEq/L   Potassium 5.0 3.5 - 5.1 mEq/L   Chloride 103 96 - 112 mEq/L   CO2 30 19 - 32 mEq/L   Glucose, Bld 97 70 - 99 mg/dL   BUN 19 6 - 23 mg/dL   Creatinine, Ser 7.25 0.40 - 1.50 mg/dL   Total Bilirubin 0.7 0.2 - 1.2 mg/dL   Alkaline Phosphatase 51 39 - 117 U/L   AST 22 0 - 37 U/L   ALT 23 0 - 53 U/L   Total Protein 7.1 6.0 - 8.3 g/dL   Albumin 4.4 3.5 - 5.2 g/dL   Calcium 9.6 8.4 - 36.6 mg/dL   GFR 44.03 >47.42 mL/min  Hemoglobin A1c  Result Value Ref Range   Hgb A1c MFr Bld 5.9 4.6 - 6.5 %  Lipid panel  Result Value Ref Range   Cholesterol 150 0 - 200  mg/dL   Triglycerides 595.6 (H) 0.0 - 149.0 mg/dL   HDL 38.75 >64.33 mg/dL   VLDL 29.5 (H) 0.0 - 18.8 mg/dL   Total CHOL/HDL Ratio 4    NonHDL 107.42   PSA  Result Value Ref Range   PSA 4.29 (H) 0.10 - 4.00 ng/mL  LDL cholesterol, direct  Result Value Ref Range   Direct LDL 77.0 mg/dL

## 2017-09-22 ENCOUNTER — Ambulatory Visit (INDEPENDENT_AMBULATORY_CARE_PROVIDER_SITE_OTHER): Payer: 59 | Admitting: Family Medicine

## 2017-09-22 ENCOUNTER — Encounter: Payer: Self-pay | Admitting: Family Medicine

## 2017-09-22 ENCOUNTER — Encounter

## 2017-09-22 VITALS — BP 160/98 | HR 64 | Resp 16 | Ht 68.0 in | Wt 248.0 lb

## 2017-09-22 DIAGNOSIS — E782 Mixed hyperlipidemia: Secondary | ICD-10-CM | POA: Diagnosis not present

## 2017-09-22 DIAGNOSIS — Z131 Encounter for screening for diabetes mellitus: Secondary | ICD-10-CM | POA: Diagnosis not present

## 2017-09-22 DIAGNOSIS — I10 Essential (primary) hypertension: Secondary | ICD-10-CM | POA: Diagnosis not present

## 2017-09-22 DIAGNOSIS — L409 Psoriasis, unspecified: Secondary | ICD-10-CM

## 2017-09-22 DIAGNOSIS — I2510 Atherosclerotic heart disease of native coronary artery without angina pectoris: Secondary | ICD-10-CM | POA: Diagnosis not present

## 2017-09-22 DIAGNOSIS — G4733 Obstructive sleep apnea (adult) (pediatric): Secondary | ICD-10-CM | POA: Diagnosis not present

## 2017-09-22 DIAGNOSIS — Z0000 Encounter for general adult medical examination without abnormal findings: Secondary | ICD-10-CM | POA: Diagnosis not present

## 2017-09-22 DIAGNOSIS — R972 Elevated prostate specific antigen [PSA]: Secondary | ICD-10-CM

## 2017-09-22 DIAGNOSIS — Z125 Encounter for screening for malignant neoplasm of prostate: Secondary | ICD-10-CM

## 2017-09-22 DIAGNOSIS — N400 Enlarged prostate without lower urinary tract symptoms: Secondary | ICD-10-CM

## 2017-09-22 LAB — COMPREHENSIVE METABOLIC PANEL
ALT: 23 U/L (ref 0–53)
AST: 22 U/L (ref 0–37)
Albumin: 4.4 g/dL (ref 3.5–5.2)
Alkaline Phosphatase: 51 U/L (ref 39–117)
BUN: 19 mg/dL (ref 6–23)
CHLORIDE: 103 meq/L (ref 96–112)
CO2: 30 mEq/L (ref 19–32)
CREATININE: 0.86 mg/dL (ref 0.40–1.50)
Calcium: 9.6 mg/dL (ref 8.4–10.5)
GFR: 96.82 mL/min (ref >60.00)
GLUCOSE: 97 mg/dL (ref 70–99)
Potassium: 5 mEq/L (ref 3.5–5.1)
Sodium: 143 mEq/L (ref 135–145)
TOTAL PROTEIN: 7.1 g/dL (ref 6.0–8.3)
Total Bilirubin: 0.7 mg/dL (ref 0.2–1.2)

## 2017-09-22 LAB — CBC
HCT: 46.8 % (ref 39.0–52.0)
Hemoglobin: 15.5 g/dL (ref 13.0–17.0)
MCHC: 33.1 g/dL (ref 30.0–36.0)
MCV: 92.7 fl (ref 78.0–100.0)
Platelets: 280 10*3/uL (ref 150.0–400.0)
RBC: 5.05 Mil/uL (ref 4.22–5.81)
RDW: 13.5 % (ref 11.5–15.5)
WBC: 6.5 10*3/uL (ref 4.0–10.5)

## 2017-09-22 LAB — LIPID PANEL
Cholesterol: 150 mg/dL (ref 0–200)
HDL: 42.9 mg/dL (ref >39.00)
NONHDL: 107.42
Total CHOL/HDL Ratio: 4
Triglycerides: 290 mg/dL — ABNORMAL HIGH (ref 0.0–149.0)
VLDL: 58 mg/dL — AB (ref 0.0–40.0)

## 2017-09-22 LAB — PSA: PSA: 4.29 ng/mL — AB (ref 0.10–4.00)

## 2017-09-22 LAB — HEMOGLOBIN A1C: HEMOGLOBIN A1C: 5.9 % (ref 4.6–6.5)

## 2017-09-22 LAB — LDL CHOLESTEROL, DIRECT: Direct LDL: 77 mg/dL

## 2017-09-22 MED ORDER — NITROGLYCERIN 0.3 MG SL SUBL: 0.3000 mg | SUBLINGUAL_TABLET | SUBLINGUAL | 2 refills | Status: DC | PRN

## 2017-09-22 MED ORDER — METOPROLOL SUCCINATE ER 100 MG PO TB24: 100.0000 mg | ORAL_TABLET | Freq: Every day | ORAL | 3 refills | Status: DC

## 2017-09-22 MED ORDER — FLUOCINONIDE 0.05 % EX CREA: 1.0000 "application " | TOPICAL_CREAM | Freq: Two times a day (BID) | CUTANEOUS | 3 refills | Status: DC

## 2017-09-22 MED ORDER — ROSUVASTATIN CALCIUM 20 MG PO TABS: 20.0000 mg | ORAL_TABLET | Freq: Every day | ORAL | 3 refills | Status: DC

## 2017-09-22 MED ORDER — CLOBETASOL PROPIONATE 0.05 % EX CREA: TOPICAL_CREAM | CUTANEOUS | 3 refills | Status: DC

## 2017-09-22 MED ORDER — FLUOCINONIDE 0.05 % EX SOLN: 1.0000 "application " | Freq: Two times a day (BID) | CUTANEOUS | 3 refills | Status: DC

## 2017-09-22 NOTE — Patient Instructions (Addendum)
It was very nice to see you today- take care and I will be in touch with your labs asap.  If you want to try stopping your allopurinol and see if you start to have gout sx again that is ok. Your BP was a bit high in the office today, but your home numbers look good. Let me know if your home numbers start to go up!   A note to my patients about results and referrals:   I will always communicate lab results with you- if you do not receive a report, this means that something went wrong.  In this case, please let me know!  If you use MyChart, I will release your results to you with notes generally within 3 days.  If you do not use MyChart, your results will be mailed.  It may take up to 2 weeks to receive your lab letter as our mail is slow.  If anything is urgent, I will call you.   A note about MyChart; if you have your MyChart account turned on, I will preferentially use this method to contact you.  If you do not think you will actually use MyChart, please do NOT enable your account as this will cause delays in communication.  If you have your account turned but do not want to use it, we can help you turn it off.  Please also let me know if you do not hear about an expected referral within 10- 14 days.  Once a referral is made I do not have a way to track it, so I need you to alert me if you do not receive your appointment.   Thank you for your help!  JC    Health Maintenance, Male A healthy lifestyle and preventive care is important for your health and wellness. Ask your health care provider about what schedule of regular examinations is right for you. What should I know about weight and diet? Eat a Healthy Diet  Eat plenty of vegetables, fruits, whole grains, low-fat dairy products, and lean protein.  Do not eat a lot of foods high in solid fats, added sugars, or salt.  Maintain a Healthy Weight Regular exercise can help you achieve or maintain a healthy weight. You should:  Do at least 150  minutes of exercise each week. The exercise should increase your heart rate and make you sweat (moderate-intensity exercise).  Do strength-training exercises at least twice a week.  Watch Your Levels of Cholesterol and Blood Lipids  Have your blood tested for lipids and cholesterol every 5 years starting at 59 years of age. If you are at high risk for heart disease, you should start having your blood tested when you are 59 years old. You may need to have your cholesterol levels checked more often if: ? Your lipid or cholesterol levels are high. ? You are older than 59 years of age. ? You are at high risk for heart disease.  What should I know about cancer screening? Many types of cancers can be detected early and may often be prevented. Lung Cancer  You should be screened every year for lung cancer if: ? You are a current smoker who has smoked for at least 30 years. ? You are a former smoker who has quit within the past 15 years.  Talk to your health care provider about your screening options, when you should start screening, and how often you should be screened.  Colorectal Cancer  Routine colorectal cancer screening  usually begins at 59 years of age and should be repeated every 5-10 years until you are 59 years old. You may need to be screened more often if early forms of precancerous polyps or small growths are found. Your health care provider may recommend screening at an earlier age if you have risk factors for colon cancer.  Your health care provider may recommend using home test kits to check for hidden blood in the stool.  A small camera at the end of a tube can be used to examine your colon (sigmoidoscopy or colonoscopy). This checks for the earliest forms of colorectal cancer.  Prostate and Testicular Cancer  Depending on your age and overall health, your health care provider may do certain tests to screen for prostate and testicular cancer.  Talk to your health care  provider about any symptoms or concerns you have about testicular or prostate cancer.  Skin Cancer  Check your skin from head to toe regularly.  Tell your health care provider about any new moles or changes in moles, especially if: ? There is a change in a mole's size, shape, or color. ? You have a mole that is larger than a pencil eraser.  Always use sunscreen. Apply sunscreen liberally and repeat throughout the day.  Protect yourself by wearing long sleeves, pants, a wide-brimmed hat, and sunglasses when outside.  What should I know about heart disease, diabetes, and high blood pressure?  If you are 57-51 years of age, have your blood pressure checked every 3-5 years. If you are 76 years of age or older, have your blood pressure checked every year. You should have your blood pressure measured twice-once when you are at a hospital or clinic, and once when you are not at a hospital or clinic. Record the average of the two measurements. To check your blood pressure when you are not at a hospital or clinic, you can use: ? An automated blood pressure machine at a pharmacy. ? A home blood pressure monitor.  Talk to your health care provider about your target blood pressure.  If you are between 33-47 years old, ask your health care provider if you should take aspirin to prevent heart disease.  Have regular diabetes screenings by checking your fasting blood sugar level. ? If you are at a normal weight and have a low risk for diabetes, have this test once every three years after the age of 60. ? If you are overweight and have a high risk for diabetes, consider being tested at a younger age or more often.  A one-time screening for abdominal aortic aneurysm (AAA) by ultrasound is recommended for men aged 65-75 years who are current or former smokers. What should I know about preventing infection? Hepatitis B If you have a higher risk for hepatitis B, you should be screened for this virus. Talk  with your health care provider to find out if you are at risk for hepatitis B infection. Hepatitis C Blood testing is recommended for:  Everyone born from [birth year] through 1967.  Anyone with known risk factors for hepatitis C.  Sexually Transmitted Diseases (STDs)  You should be screened each year for STDs including gonorrhea and chlamydia if: ? You are sexually active and are younger than 59 years of age. ? You are older than 59 years of age and your health care provider tells you that you are at risk for this type of infection. ? Your sexual activity has changed since you were last screened and  you are at an increased risk for chlamydia or gonorrhea. Ask your health care provider if you are at risk.  Talk with your health care provider about whether you are at high risk of being infected with HIV. Your health care provider may recommend a prescription medicine to help prevent HIV infection.  What else can I do?  Schedule regular health, dental, and eye exams.  Stay current with your vaccines (immunizations).  Do not use any tobacco products, such as cigarettes, chewing tobacco, and e-cigarettes. If you need help quitting, ask your health care provider.  Limit alcohol intake to no more than 2 drinks per day. One drink equals 12 ounces of beer, 5 ounces of wine, or 1 ounces of hard liquor.  Do not use street drugs.  Do not share needles.  Ask your health care provider for help if you need support or information about quitting drugs.  Tell your health care provider if you often feel depressed.  Tell your health care provider if you have ever been abused or do not feel safe at home. This information is not intended to replace advice given to you by your health care provider. Make sure you discuss any questions you have with your health care provider. Document Released: 11/09/2007 Document Revised: 01/10/2016 Document Reviewed: 02/14/2015 Elsevier Interactive Patient Education  [employer].

## 2017-09-24 ENCOUNTER — Telehealth: Payer: Self-pay

## 2017-09-24 DIAGNOSIS — L409 Psoriasis, unspecified: Secondary | ICD-10-CM

## 2017-09-24 NOTE — Telephone Encounter (Signed)
PA initiated via Covermymeds; KEY: [id number]. Awaiting determination.

## 2017-09-29 ENCOUNTER — Encounter: Payer: Self-pay | Admitting: Family Medicine

## 2017-09-29 MED ORDER — CLOBETASOL PROPIONATE 0.05 % EX CREA: TOPICAL_CREAM | CUTANEOUS | 3 refills | Status: DC

## 2017-09-29 NOTE — Telephone Encounter (Signed)
PA denied for #300g of clobetasol 0.05% cream. Patients plan allows them to receive #45g per prescription at mail order.

## 2017-10-01 ENCOUNTER — Encounter: Payer: Self-pay | Admitting: Family Medicine

## 2017-11-17 ENCOUNTER — Emergency Department (HOSPITAL_BASED_OUTPATIENT_CLINIC_OR_DEPARTMENT_OTHER): Payer: 59

## 2017-11-17 ENCOUNTER — Inpatient Hospital Stay (HOSPITAL_BASED_OUTPATIENT_CLINIC_OR_DEPARTMENT_OTHER)
Admission: EM | Admit: 2017-11-17 | Discharge: 2017-11-20 | DRG: 872 | Disposition: A | Discharge status: Home / Self Care | Payer: 59 | Attending: Internal Medicine | Admitting: Internal Medicine

## 2017-11-17 ENCOUNTER — Inpatient Hospital Stay (HOSPITAL_COMMUNITY): Payer: 59

## 2017-11-17 ENCOUNTER — Encounter (HOSPITAL_BASED_OUTPATIENT_CLINIC_OR_DEPARTMENT_OTHER): Payer: Self-pay | Admitting: Emergency Medicine

## 2017-11-17 ENCOUNTER — Other Ambulatory Visit: Payer: Self-pay

## 2017-11-17 DIAGNOSIS — F329 Major depressive disorder, single episode, unspecified: Secondary | ICD-10-CM | POA: Diagnosis present

## 2017-11-17 DIAGNOSIS — K5730 Diverticulosis of large intestine without perforation or abscess without bleeding: Secondary | ICD-10-CM | POA: Diagnosis present

## 2017-11-17 DIAGNOSIS — Z87891 Personal history of nicotine dependence: Secondary | ICD-10-CM

## 2017-11-17 DIAGNOSIS — L409 Psoriasis, unspecified: Secondary | ICD-10-CM | POA: Diagnosis not present

## 2017-11-17 DIAGNOSIS — I2510 Atherosclerotic heart disease of native coronary artery without angina pectoris: Secondary | ICD-10-CM | POA: Diagnosis not present

## 2017-11-17 DIAGNOSIS — Z9079 Acquired absence of other genital organ(s): Secondary | ICD-10-CM

## 2017-11-17 DIAGNOSIS — N10 Acute tubulo-interstitial nephritis: Secondary | ICD-10-CM | POA: Diagnosis present

## 2017-11-17 DIAGNOSIS — H109 Unspecified conjunctivitis: Secondary | ICD-10-CM | POA: Diagnosis present

## 2017-11-17 DIAGNOSIS — E669 Obesity, unspecified: Secondary | ICD-10-CM | POA: Diagnosis present

## 2017-11-17 DIAGNOSIS — Z87892 Personal history of anaphylaxis: Secondary | ICD-10-CM

## 2017-11-17 DIAGNOSIS — Z91040 Latex allergy status: Secondary | ICD-10-CM

## 2017-11-17 DIAGNOSIS — Z882 Allergy status to sulfonamides status: Secondary | ICD-10-CM

## 2017-11-17 DIAGNOSIS — Z955 Presence of coronary angioplasty implant and graft: Secondary | ICD-10-CM

## 2017-11-17 DIAGNOSIS — R338 Other retention of urine: Secondary | ICD-10-CM | POA: Diagnosis not present

## 2017-11-17 DIAGNOSIS — K760 Fatty (change of) liver, not elsewhere classified: Secondary | ICD-10-CM | POA: Diagnosis present

## 2017-11-17 DIAGNOSIS — R7989 Other specified abnormal findings of blood chemistry: Secondary | ICD-10-CM | POA: Diagnosis not present

## 2017-11-17 DIAGNOSIS — N390 Urinary tract infection, site not specified: Secondary | ICD-10-CM | POA: Diagnosis present

## 2017-11-17 DIAGNOSIS — I700 Atherosclerosis of aorta: Secondary | ICD-10-CM | POA: Diagnosis present

## 2017-11-17 DIAGNOSIS — Z87440 Personal history of urinary (tract) infections: Secondary | ICD-10-CM

## 2017-11-17 DIAGNOSIS — E872 Acidosis: Secondary | ICD-10-CM | POA: Diagnosis present

## 2017-11-17 DIAGNOSIS — Z87898 Personal history of other specified conditions: Secondary | ICD-10-CM

## 2017-11-17 DIAGNOSIS — N3001 Acute cystitis with hematuria: Secondary | ICD-10-CM

## 2017-11-17 DIAGNOSIS — N433 Hydrocele, unspecified: Secondary | ICD-10-CM | POA: Diagnosis present

## 2017-11-17 DIAGNOSIS — Z801 Family history of malignant neoplasm of trachea, bronchus and lung: Secondary | ICD-10-CM | POA: Diagnosis not present

## 2017-11-17 DIAGNOSIS — I2584 Coronary atherosclerosis due to calcified coronary lesion: Secondary | ICD-10-CM | POA: Diagnosis present

## 2017-11-17 DIAGNOSIS — E782 Mixed hyperlipidemia: Secondary | ICD-10-CM

## 2017-11-17 DIAGNOSIS — I10 Essential (primary) hypertension: Secondary | ICD-10-CM | POA: Diagnosis not present

## 2017-11-17 DIAGNOSIS — A419 Sepsis, unspecified organism: Secondary | ICD-10-CM | POA: Diagnosis not present

## 2017-11-17 DIAGNOSIS — N451 Epididymitis: Secondary | ICD-10-CM | POA: Diagnosis not present

## 2017-11-17 DIAGNOSIS — Z833 Family history of diabetes mellitus: Secondary | ICD-10-CM

## 2017-11-17 DIAGNOSIS — Z8249 Family history of ischemic heart disease and other diseases of the circulatory system: Secondary | ICD-10-CM

## 2017-11-17 DIAGNOSIS — G4733 Obstructive sleep apnea (adult) (pediatric): Secondary | ICD-10-CM | POA: Diagnosis present

## 2017-11-17 DIAGNOSIS — F419 Anxiety disorder, unspecified: Secondary | ICD-10-CM | POA: Diagnosis present

## 2017-11-17 DIAGNOSIS — K219 Gastro-esophageal reflux disease without esophagitis: Secondary | ICD-10-CM | POA: Diagnosis not present

## 2017-11-17 DIAGNOSIS — M109 Gout, unspecified: Secondary | ICD-10-CM | POA: Diagnosis present

## 2017-11-17 DIAGNOSIS — R55 Syncope and collapse: Secondary | ICD-10-CM | POA: Diagnosis not present

## 2017-11-17 DIAGNOSIS — Z823 Family history of stroke: Secondary | ICD-10-CM

## 2017-11-17 DIAGNOSIS — Z6837 Body mass index (BMI) 37.0-37.9, adult: Secondary | ICD-10-CM

## 2017-11-17 DIAGNOSIS — A4151 Sepsis due to Escherichia coli [E. coli]: Principal | ICD-10-CM | POA: Diagnosis present

## 2017-11-17 DIAGNOSIS — R310 Gross hematuria: Secondary | ICD-10-CM | POA: Diagnosis not present

## 2017-11-17 DIAGNOSIS — N401 Enlarged prostate with lower urinary tract symptoms: Secondary | ICD-10-CM | POA: Diagnosis present

## 2017-11-17 DIAGNOSIS — E785 Hyperlipidemia, unspecified: Secondary | ICD-10-CM | POA: Diagnosis present

## 2017-11-17 DIAGNOSIS — Z888 Allergy status to other drugs, medicaments and biological substances status: Secondary | ICD-10-CM

## 2017-11-17 DIAGNOSIS — R52 Pain, unspecified: Secondary | ICD-10-CM

## 2017-11-17 DIAGNOSIS — Z885 Allergy status to narcotic agent status: Secondary | ICD-10-CM

## 2017-11-17 LAB — BASIC METABOLIC PANEL
Anion gap: 10 (ref 5–15)
BUN: 16 mg/dL (ref 6–20)
CHLORIDE: 103 mmol/L (ref 101–111)
CO2: 26 mmol/L (ref 22–32)
CREATININE: 1.19 mg/dL (ref 0.61–1.24)
Calcium: 8.9 mg/dL (ref 8.9–10.3)
GFR calc non Af Amer: >60 mL/min (ref >60)
Glucose, Bld: 140 mg/dL — ABNORMAL HIGH (ref 65–99)
Potassium: 4.7 mmol/L (ref 3.5–5.1)
Sodium: 139 mmol/L (ref 135–145)

## 2017-11-17 LAB — URINALYSIS, MICROSCOPIC (REFLEX): WBC, UA: >50 WBC/hpf (ref 0–5)

## 2017-11-17 LAB — CBC WITH DIFFERENTIAL/PLATELET
BASOS PCT: 0 %
Basophils Absolute: 0 10*3/uL (ref 0.0–0.1)
Eosinophils Absolute: 0.1 10*3/uL (ref 0.0–0.7)
Eosinophils Relative: 1 %
HEMATOCRIT: 44.4 % (ref 39.0–52.0)
HEMOGLOBIN: 15.4 g/dL (ref 13.0–17.0)
LYMPHS ABS: 1.2 10*3/uL (ref 0.7–4.0)
Lymphocytes Relative: 8 %
MCH: 31.2 pg (ref 26.0–34.0)
MCHC: 34.7 g/dL (ref 30.0–36.0)
MCV: 90.1 fL (ref 78.0–100.0)
MONOS PCT: 7 %
Monocytes Absolute: 1.1 10*3/uL — ABNORMAL HIGH (ref 0.1–1.0)
NEUTROS ABS: 12.9 10*3/uL — AB (ref 1.7–7.7)
NEUTROS PCT: 84 %
Platelets: 189 10*3/uL (ref 150–400)
RBC: 4.93 MIL/uL (ref 4.22–5.81)
RDW: 13.3 % (ref 11.5–15.5)
WBC: 15.2 10*3/uL — ABNORMAL HIGH (ref 4.0–10.5)

## 2017-11-17 LAB — URINALYSIS, ROUTINE W REFLEX MICROSCOPIC
Bilirubin Urine: NEGATIVE
Glucose, UA: NEGATIVE mg/dL
Ketones, ur: 15 mg/dL — AB
NITRITE: POSITIVE — AB
PH: 8 (ref 5.0–8.0)
Protein, ur: 100 mg/dL — AB
SPECIFIC GRAVITY, URINE: 1.02 (ref 1.005–1.030)

## 2017-11-17 LAB — PROCALCITONIN: Procalcitonin: 0.29 ng/mL

## 2017-11-17 LAB — CBC
HCT: 43.2 % (ref 39.0–52.0)
HEMOGLOBIN: 14.7 g/dL (ref 13.0–17.0)
MCH: 30.9 pg (ref 26.0–34.0)
MCHC: 34 g/dL (ref 30.0–36.0)
MCV: 90.8 fL (ref 78.0–100.0)
Platelets: 201 10*3/uL (ref 150–400)
RBC: 4.76 MIL/uL (ref 4.22–5.81)
RDW: 13.4 % (ref 11.5–15.5)
WBC: 15.8 10*3/uL — AB (ref 4.0–10.5)

## 2017-11-17 LAB — PROTIME-INR
INR: 1.18
Prothrombin Time: 14.9 seconds (ref 11.4–15.2)

## 2017-11-17 LAB — APTT: aPTT: 32 seconds (ref 24–36)

## 2017-11-17 LAB — CREATININE, SERUM
CREATININE: 1.06 mg/dL (ref 0.61–1.24)
GFR calc non Af Amer: >60 mL/min (ref >60)

## 2017-11-17 LAB — I-STAT CG4 LACTIC ACID, ED
LACTIC ACID, VENOUS: 2.76 mmol/L — AB (ref 0.5–1.9)
Lactic Acid, Venous: 2.59 mmol/L (ref 0.5–1.9)

## 2017-11-17 LAB — LACTIC ACID, PLASMA
LACTIC ACID, VENOUS: 2.2 mmol/L — AB (ref 0.5–1.9)
Lactic Acid, Venous: 3.2 mmol/L (ref 0.5–1.9)
Lactic Acid, Venous: 2.3 mmol/L (ref 0.5–1.9)

## 2017-11-17 LAB — TSH: TSH: 0.644 u[IU]/mL (ref 0.350–4.500)

## 2017-11-17 MED ORDER — FLUTICASONE PROPIONATE 50 MCG/ACT NA SUSP
1.0000 | Freq: Every day | NASAL | Status: DC | PRN
  Administered 2017-11-20: 1 via NASAL
  Filled 2017-11-17: qty 16

## 2017-11-17 MED ORDER — SODIUM CHLORIDE 0.9 % IV BOLUS
1000.0000 mL | Freq: Once | INTRAVENOUS | Status: AC
  Administered 2017-11-17: 1000 mL via INTRAVENOUS

## 2017-11-17 MED ORDER — SODIUM CHLORIDE 0.9 % IV SOLN
2.0000 g | INTRAVENOUS | Status: DC
  Administered 2017-11-17 – 2017-11-19 (×3): 2 g via INTRAVENOUS
  Filled 2017-11-17 (×3): qty 2

## 2017-11-17 MED ORDER — SODIUM CHLORIDE 0.9 % IV BOLUS
500.0000 mL | Freq: Once | INTRAVENOUS | Status: AC
  Administered 2017-11-17: 500 mL via INTRAVENOUS

## 2017-11-17 MED ORDER — ACETAMINOPHEN 325 MG PO TABS
650.0000 mg | ORAL_TABLET | Freq: Four times a day (QID) | ORAL | Status: DC | PRN
  Administered 2017-11-17 – 2017-11-19 (×5): 650 mg via ORAL
  Filled 2017-11-17 (×5): qty 2

## 2017-11-17 MED ORDER — SODIUM CHLORIDE 0.9 % IV SOLN
INTRAVENOUS | Status: DC
  Administered 2017-11-17 – 2017-11-20 (×7): via INTRAVENOUS

## 2017-11-17 MED ORDER — CARBOXYMETHYLCELLUL-GLYCERIN 0.5-0.9 % OP SOLN: 2.0000 [drp] | Freq: Two times a day (BID) | OPHTHALMIC | Status: DC

## 2017-11-17 MED ORDER — POLYVINYL ALCOHOL 1.4 % OP SOLN
2.0000 [drp] | Freq: Two times a day (BID) | OPHTHALMIC | Status: DC
  Administered 2017-11-18 – 2017-11-19 (×3): 2 [drp] via OPHTHALMIC
  Filled 2017-11-17: qty 15

## 2017-11-17 MED ORDER — HEPARIN SODIUM (PORCINE) 5000 UNIT/ML IJ SOLN
5000.0000 [IU] | Freq: Three times a day (TID) | INTRAMUSCULAR | Status: DC
  Administered 2017-11-17 – 2017-11-20 (×9): 5000 [IU] via SUBCUTANEOUS
  Filled 2017-11-17 (×9): qty 1

## 2017-11-17 MED ORDER — ADULT MULTIVITAMIN W/MINERALS CH
1.0000 | ORAL_TABLET | Freq: Every day | ORAL | Status: DC
  Administered 2017-11-18 – 2017-11-20 (×3): 1 via ORAL
  Filled 2017-11-17 (×3): qty 1

## 2017-11-17 MED ORDER — ACETAMINOPHEN 325 MG PO TABS
650.0000 mg | ORAL_TABLET | Freq: Once | ORAL | Status: AC
  Administered 2017-11-17: 650 mg via ORAL
  Filled 2017-11-17: qty 2

## 2017-11-17 MED ORDER — ONDANSETRON HCL 4 MG PO TABS: 4.0000 mg | ORAL_TABLET | Freq: Four times a day (QID) | ORAL | Status: DC | PRN

## 2017-11-17 MED ORDER — OMEGA-3-ACID ETHYL ESTERS 1 G PO CAPS
1.0000 g | ORAL_CAPSULE | Freq: Every day | ORAL | Status: DC
  Administered 2017-11-17 – 2017-11-20 (×4): 1 g via ORAL
  Filled 2017-11-17 (×4): qty 1

## 2017-11-17 MED ORDER — NITROGLYCERIN 0.3 MG SL SUBL: 0.3000 mg | SUBLINGUAL_TABLET | SUBLINGUAL | Status: DC | PRN

## 2017-11-17 MED ORDER — ACETAMINOPHEN 500 MG PO TABS
ORAL_TABLET | ORAL | Status: AC
  Administered 2017-11-17: 1000 mg
  Filled 2017-11-17: qty 2

## 2017-11-17 MED ORDER — ALLOPURINOL 300 MG PO TABS
300.0000 mg | ORAL_TABLET | Freq: Every day | ORAL | Status: DC
  Administered 2017-11-17 – 2017-11-20 (×4): 300 mg via ORAL
  Filled 2017-11-17 (×4): qty 1

## 2017-11-17 MED ORDER — MULTIVITAMINS PO CAPS: 1.0000 | ORAL_CAPSULE | Freq: Every morning | ORAL | Status: DC

## 2017-11-17 MED ORDER — SODIUM CHLORIDE 0.9 % IV SOLN
1.0000 g | Freq: Once | INTRAVENOUS | Status: AC
  Administered 2017-11-17: 1 g via INTRAVENOUS
  Filled 2017-11-17: qty 10

## 2017-11-17 MED ORDER — SENNOSIDES-DOCUSATE SODIUM 8.6-50 MG PO TABS: 1.0000 | ORAL_TABLET | Freq: Every evening | ORAL | Status: DC | PRN

## 2017-11-17 MED ORDER — CEPHALEXIN 500 MG PO CAPS: 500.0000 mg | ORAL_CAPSULE | Freq: Four times a day (QID) | ORAL | 0 refills | Status: DC

## 2017-11-17 MED ORDER — FLUOXETINE HCL 20 MG PO CAPS
20.0000 mg | ORAL_CAPSULE | Freq: Every day | ORAL | Status: DC
  Administered 2017-11-17 – 2017-11-20 (×4): 20 mg via ORAL
  Filled 2017-11-17 (×4): qty 1

## 2017-11-17 MED ORDER — ACETAMINOPHEN 650 MG RE SUPP: 650.0000 mg | Freq: Four times a day (QID) | RECTAL | Status: DC | PRN

## 2017-11-17 MED ORDER — ONDANSETRON HCL 4 MG/2ML IJ SOLN: 4.0000 mg | Freq: Four times a day (QID) | INTRAMUSCULAR | Status: DC | PRN

## 2017-11-17 MED ORDER — ROSUVASTATIN CALCIUM 20 MG PO TABS
20.0000 mg | ORAL_TABLET | Freq: Every day | ORAL | Status: DC
  Administered 2017-11-17 – 2017-11-20 (×4): 20 mg via ORAL
  Filled 2017-11-17 (×4): qty 1

## 2017-11-17 MED ORDER — ASPIRIN EC 325 MG PO TBEC
325.0000 mg | DELAYED_RELEASE_TABLET | Freq: Every day | ORAL | Status: DC
Start: 2017-11-17 — End: 2017-11-20
  Administered 2017-11-17 – 2017-11-20 (×4): 325 mg via ORAL
  Filled 2017-11-17 (×4): qty 1

## 2017-11-17 MED ORDER — METOPROLOL SUCCINATE ER 100 MG PO TB24
100.0000 mg | ORAL_TABLET | Freq: Every day | ORAL | Status: DC
  Administered 2017-11-17 – 2017-11-20 (×4): 100 mg via ORAL
  Filled 2017-11-17 (×4): qty 1

## 2017-11-17 MED ORDER — LORATADINE 10 MG PO TABS
10.0000 mg | ORAL_TABLET | Freq: Every day | ORAL | Status: DC
  Administered 2017-11-17 – 2017-11-20 (×4): 10 mg via ORAL
  Filled 2017-11-17 (×4): qty 1

## 2017-11-17 MED FILL — CEPHALEXIN 500 MG CAPSULE: 500 | 7 days supply | Qty: 28 | Fill #0

## 2017-11-17 NOTE — ED Notes (Signed)
Pt arrived via EMS from home. Got up around 0200 to urinate and noticed blood in urine. Low abd and right testicle pain as well. Also states he became light-headed. Fever 101.2, chills, diaphoresis. Placed on monitor showing NSR. EKG obtained. Labs drawn from IV site. Dr. Wilkie AyeHorton in to examine.

## 2017-11-17 NOTE — ED Provider Notes (Signed)
[facility] EMERGENCY DEPARTMENT Provider Note   CSN: [id number] Arrival date & time: 11/17/17  4098     History   Chief Complaint Chief Complaint  Patient presents with  . Hematuria    HPI Charles [name] is a 57 y.o. male.  HPI  This is a 57 year old male with a history of BPH, coronary artery disease, hypertension, hyperlipidemia who presents with hematuria near syncope.  Patient reports that he felt fine when he went to bed last night.  At approximately 2 AM he began to feel chilled and reports fever to 101.  He got up to go to the restroom and noted initial hematuria with stream that cleared.  He subsequently had 2 additional episodes of hematuria.  Patient reports that he generally feels fatigued and chilled.  He reports lightheaded dizziness after urination as well as upon standing.  He denies any back pain or history of kidney stones.  He does report some right testicle pain.  Denies any concerns for STDs.  Denies any other infectious symptoms including cough, upper respiratory symptoms, chest pain, shortness of breath, abdominal pain, nausea, vomiting.  He does report urinary urgency.  Past Medical History:  Diagnosis Date  . BPH (benign prostatic hypertrophy) with urinary retention   . CAD (coronary artery disease) 04/2008   successful PCI of the lesion in the proximal LAD using xience drug-eluting stent with improvement in central narrowing from 95% to 0%.  Dr. Juanda Chance  . Foley catheter in place   . Gout    per pt stable is of 08-18-2014  . History of acute bronchitis    recently 07-16-2014  . History of sepsis    urosepsis--  08-13-2014 admission  . Hyperlipidemia LDL goal <100   . Hypertension   . Multilevel degenerative disc disease    cervical and lumbar  . OSA (obstructive sleep apnea)       . Psoriasis   . S/P drug eluting coronary stent placement    05-13-2008   DES X1 TO pLAD  . Sigmoid diverticulosis     Patient Active Problem List   Diagnosis Date Noted  . Routine general medical examination at a health care facility 08/07/2015  . Left calcaneal bursitis 12/14/2014  . 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 Allergic reaction 10/14/2014  . Achilles tendinosis 10/05/2014  . OSA (obstructive sleep apnea) 08/18/2014  . Urinary tract infectious disease 08/14/2014  . Hypotension 08/14/2014  . Hyperlipidemia LDL goal <100 08/14/2014  . Gout 08/14/2014  . Obesity 06/02/2013  . Abnormal MRI, spine 05/09/2013  . H/O urinary retention 05/09/2013  . Psoriasis 05/08/2012  . CORONARY ATHEROSCLEROSIS, NATIVE VESSEL 08/03/2008  . Hyperlipidemia 11/10/2007  . History of gout 11/10/2007  . Essential hypertension 11/10/2007  . G E R D 01/06/2007    Past Surgical History:  Procedure Laterality Date  . BUNIONECTOMY Right 2000  . COLONOSCOPY WITH PROPOFOL  last one 10-06-2013  . CORONARY ANGIOPLASTY WITH STENT PLACEMENT  05-13-2008    Dr Charlies Constable   PCI w/  DES x1 to  pLAD/  ef 65%  . GREEN LIGHT LASER TURP (TRANSURETHRAL RESECTION OF PROSTATE N/A 08/23/2014   Procedure: GREEN LIGHT LASER TURP (TRANSURETHRAL RESECTION OF PROSTATE;  Surgeon: Bjorn Pippin, MD;  Location: [facility] [place];  Service: Urology;  Laterality: N/A;  . WRIST GANGLION EXCISION Left 2001        Home Medications    Prior to Admission medications   Medication Sig Start Date  End Date Taking? Authorizing Provider  allopurinol (ZYLOPRIM) 300 MG tablet TAKE 1 TABLET BY MOUTH EVERY DAY 06/20/17   Copland, Gwenlyn FoundJessica C, MD  aspirin 325 MG EC tablet Take 325 mg by mouth daily.    [provider]  Aspirin-Acetaminophen-Caffeine (EXCEDRIN PO) Take 2 tablets by mouth daily as needed (headache).    [provider]  Carboxymethylcellul-Glycerin 0.5-0.9 % SOLN Place 2 drops into both eyes 2 (two) times daily. 07/31/16   Veryl Speakalone, Gregory D, FNP  cetirizine (ZYRTEC) 10 MG tablet Take 10 mg by mouth at bedtime.    [provider]  clobetasol cream (TEMOVATE) 0.05 % APPLY 1 APPLICATION TOPICALLY TWO TIMES A DAY AS NEEDED 09/29/17   Copland, Gwenlyn FoundJessica C, MD  Coenzyme Q10 (CO Q 10 PO) Take 1 capsule by mouth every morning. 500mg     [provider]  fluocinonide (LIDEX) 0.05 % external solution Apply 1 application topically 2 (two) times daily. 09/22/17   Copland, Gwenlyn FoundJessica C, MD  fluocinonide cream (LIDEX) 0.05 % Apply 1 application topically 2 (two) times daily. 09/22/17   Copland, Gwenlyn FoundJessica C, MD  FLUoxetine (PROZAC) 20 MG capsule TAKE 1 CAPSULE DAILY---  takes in am 06/24/17   Copland, Gwenlyn FoundJessica C, MD  ibuprofen (ADVIL,MOTRIN) 200 MG tablet Take 200 mg by mouth every 6 (six) hours as needed. 2 tabs=400mg  per dose for muscle aches    [provider]  metoprolol succinate (TOPROL-XL) 100 MG 24 hr tablet Take 1 tablet (100 mg total) by mouth daily. 09/22/17   Copland, Gwenlyn FoundJessica C, MD  Multiple Vitamin (MULTIVITAMIN) capsule Take 1 capsule by mouth every morning.     [provider]  nitroGLYCERIN (NITROSTAT) 0.3 MG SL tablet Place 1 tablet (0.3 mg total) under the tongue every 5 (five) minutes as needed for chest pain. 09/22/17   Copland, Gwenlyn FoundJessica C, MD  Omega-3 Fatty Acids (FISH OIL) 1000 MG CAPS Take 1,000 mg by mouth every morning.     [provider]  rosuvastatin (CRESTOR) 20 MG tablet Take 1 tablet (20 mg total) by mouth daily. 09/22/17   Copland, Gwenlyn FoundJessica C, MD    Family History Family History  Problem Relation Age of Onset  . Hypertension Father   . Stroke Father 951  . Lung cancer Mother         smoker  . Diabetes Sister   . Heart disease Brother 49        stent LAD 2012  . Colon cancer Neg Hx   . Pancreatic cancer Neg Hx   . Rectal cancer Neg Hx   . Stomach cancer Neg Hx     Social History Social History   Tobacco Use  . Smoking status: Former Smoker    Packs/day: 0.75    Years: 14.00    Pack years: 10.50    Types: Cigarettes    Last attempt to quit: 05/27/1997    Years since  quitting: 20.4  . Smokeless tobacco: Former NeurosurgeonUser    Types: Snuff    Quit date: 08/17/1997  Substance Use Topics  . Alcohol use: Yes    Alcohol/week: 1.2 oz    Types: 2 Cans of beer per week    Comment: occasional  . Drug use: No     Allergies   Lidocaine; Neomycin sulfate; Sulfa antibiotics; Sulfa drugs cross reactors; Flexeril [cyclobenzaprine]; Hctz [hydrochlorothiazide]; Tramadol; Atrovent [ipratropium]; and Latex   Review of Systems Review of Systems  Constitutional: Positive for chills, diaphoresis and fever.  HENT: Negative for  congestion.   Respiratory: Negative for cough and shortness of breath.   Cardiovascular: Negative for chest pain.  Gastrointestinal: Negative for abdominal pain, nausea and vomiting.  Genitourinary: Positive for hematuria, testicular pain and urgency. Negative for dysuria and frequency.  Musculoskeletal: Negative for back pain.  Neurological: Positive for light-headedness. Negative for syncope.  All other systems reviewed and are negative.    Physical Exam Updated Vital Signs BP 118/65 (BP Location: Right Arm)   Pulse 79   Temp (!) 100.8 F (38.2 C) (Oral)   Resp 20   Ht 5\' 8"  (1.727 m)   Wt 110.2 kg (243 lb)   SpO2 99%   BMI 36.95 kg/m   Physical Exam  Constitutional: He is oriented to person, place, and time. He appears well-developed and well-nourished.  Overweight, nontoxic-appearing  HENT:  Head: Normocephalic and atraumatic.  Eyes: Pupils are equal, round, and reactive to light.  Neck: Neck supple.  Cardiovascular: Normal rate, regular rhythm and normal heart sounds.  No murmur heard. Pulmonary/Chest: Effort normal and breath sounds normal. No respiratory distress. He has no wheezes.  Abdominal: Soft. Bowel sounds are normal. There is tenderness. There is no rebound.  Slight suprapubic tenderness to palpation, no rebound or guarding, no mass  Genitourinary:  Genitourinary Comments: Circumcised penis, normal testicular lie, no  masses or swelling noted, no overlying skin changes or crepitus, intact cremasteric reflex, there is some tenderness palpation of the epididymis  Musculoskeletal: He exhibits no edema.  Neurological: He is alert and oriented to person, place, and time.  Cranial nerves II through XII intact, 5 out of 5 strength in all 4 extremities  Skin: Skin is warm and dry.  Psychiatric: He has a normal mood and affect.  Nursing note and vitals reviewed.    ED Treatments / Results  Labs (all labs ordered are listed, but only abnormal results are displayed) Labs Reviewed  CBC WITH DIFFERENTIAL/PLATELET - Abnormal; Notable for the following components:      Result Value   WBC 15.2 (*)    Neutro Abs 12.9 (*)    Monocytes Absolute 1.1 (*)    All other components within normal limits  URINALYSIS, ROUTINE W REFLEX MICROSCOPIC  BASIC METABOLIC PANEL    EKG EKG Interpretation  Date/Time:  Monday November 17 2017 06:33:25 EDT Ventricular Rate:  81 PR Interval:    QRS Duration: 90 QT Interval:  386 QTC Calculation: 448 R Axis:   54 Text Interpretation:  Sinus rhythm Confirmed by Ross Marcus ([id number]) on 11/17/2017 6:43:58 AM   Radiology No results found.  Procedures Procedures (including critical care time)  Medications Ordered in ED Medications  sodium chloride 0.9 % bolus 1,000 mL (1,000 mLs Intravenous New Bag/Given 11/17/17 0642)  acetaminophen (TYLENOL) tablet 650 mg (has no administration in time range)     Initial Impression / Assessment and Plan / ED Course  I have reviewed the triage vital signs and the nursing notes.  Pertinent labs & imaging results that were available during my care of the patient were reviewed by me and considered in my medical decision making (see chart for details).     Patient presents with hematuria, lightheadedness, and fevers.  Onset of symptoms early this morning.  He is overall nontoxic-appearing.  Temperature noted to be 100.8.  Otherwise vital  signs are within normal limits.  Exam with slight suprapubic tenderness to palpation and tenderness to the right testicle without overlying skin changes or mass.  Considerations include simple UTI, pyelonephritis, infected  kidney stone, epididymitis.  EKG shows no evidence of arrhythmia.  Patient's lightheadedness likely related to acute illness with insensible losses from fever as well as post micturition near syncope.  Patient was given fluids.  Orthostatics pending.  Urine, CBC, BMP obtained.  Patient does not have any history of kidney stones.  I have reviewed his chart.  He did have a CT scan 3 years ago that was negative.  However, given testicular pain, infected stone is a possibility although classically he would also have some back pain as well.  Will obtain a CT stone study to further evaluate.  At time of my signout, work-up is pending.  Patient signed out to oncoming provider.  Patient given Tylenol for fever.  Final Clinical Impressions(s) / ED Diagnoses   Final diagnoses:  Gross hematuria  Near syncope    ED Discharge Orders    None       Shon Baton, MD 11/17/17 [phone]

## 2017-11-17 NOTE — Discharge Instructions (Addendum)
Hematuria, Adult Hematuria is blood in your urine. It can be caused by a bladder infection, kidney infection, prostate infection, kidney stone, or cancer of your urinary tract. Infections can usually be treated with medicine, and a kidney stone usually will pass through your urine. If neither of these is the cause of your hematuria, further workup to find out the reason may be needed. It is very important that you tell your health care provider about any blood you see in your urine, even if the blood stops without treatment or happens without causing pain. Blood in your urine that happens and then stops and then happens again can be a symptom of a very serious condition. Also, pain is not a symptom in the initial stages of many urinary cancers. Follow these instructions at home:  Drink lots of fluid, 3-4 quarts a day. If you have been diagnosed with an infection, cranberry juice is especially recommended, in addition to large amounts of water.  Avoid caffeine, tea, and carbonated beverages because they tend to irritate the bladder.  Avoid alcohol because it may irritate the prostate.  Take all medicines as directed by your health care provider.  If you were prescribed an antibiotic medicine, finish it all even if you start to feel better.  If you have been diagnosed with a kidney stone, follow your health care provider's instructions regarding straining your urine to catch the stone.  Empty your bladder often. Avoid holding urine for long periods of time.  After a bowel movement, women should cleanse front to back. Use each tissue only once.  Empty your bladder before and after sexual intercourse if you are a male. Contact a health care provider if:  You develop back pain.  You have a fever.  You have a feeling of sickness in your stomach (nausea) or vomiting.  Your symptoms are not better in 3 days. Return sooner if you are getting worse. Get help right away if:  You develop  severe vomiting and are unable to keep the medicine down.  You develop severe back or abdominal pain despite taking your medicines.  You begin passing a large amount of blood or clots in your urine.  You feel extremely weak or faint, or you pass out. This information is not intended to replace advice given to you by your health care provider. Make sure you discuss any questions you have with your health care provider. Document Released: 05/13/2005 Document Revised: 10/19/2015 Document Reviewed: 01/11/2013 Elsevier Interactive Patient Education  2017 [employer]   Urinary Tract Infection, Adult A urinary tract infection (UTI) is an infection of any part of the urinary tract. The urinary tract includes the:  Kidneys.  Ureters.  Bladder.  Urethra.  These organs make, store, and get rid of pee (urine) in the body. Follow these instructions at home:  Take over-the-counter and prescription medicines only as told by your doctor.  If you were prescribed an antibiotic medicine, take it as told by your doctor. Do not stop taking the antibiotic even if you start to feel better.  Avoid the following drinks: ? Alcohol. ? Caffeine. ? Tea. ? Carbonated drinks.  Drink enough fluid to keep your pee clear or pale yellow.  Keep all follow-up visits as told by your doctor. This is important.  Make sure to: ? Empty your bladder often and completely. Do not to hold pee for long periods of time. ? Empty your bladder before and after sex. ? Wipe from front to back  after a bowel movement if you are male. Use each tissue one time when you wipe. Contact a doctor if:  You have back pain.  You have a fever.  You feel sick to your stomach (nauseous).  You throw up (vomit).  Your symptoms do not get better after 3 days.  Your symptoms go away and then come back. Get help right away if:  You have very bad back pain.  You have very bad lower belly (abdominal) pain.  You are  throwing up and cannot keep down any medicines or water. This information is not intended to replace advice given to you by your health care provider. Make sure you discuss any questions you have with your health care provider. Document Released: 10/30/2007 Document Revised: 10/19/2015 Document Reviewed: 04/03/2015 Elsevier Interactive Patient Education  [employer]2018 [employer] It was our pleasure to provide your ER care today - we hope that you feel better.  Take antibiotic as prescribed.  Rest. Drink plenty of fluids.   Take acetaminophen and/or ibuprofen as need for pain or fever.   A urine culture was sent the results of which will come back in 1-2 days - have your urologist follow up on those results at your appointment this week.   Return to ER if worse, new symptoms, severe pain, persistent vomiting, weak/fainting, other concern.

## 2017-11-17 NOTE — Progress Notes (Signed)
CRITICAL VALUE ALERT  Critical Value:  Lactic Acid: 2.2   Date & Time Notied:  11/17/2017 1830  Provider Notified: Dr. Marland McalpineSheikh  Orders Received/Actions taken: getting IVF

## 2017-11-17 NOTE — ED Triage Notes (Addendum)
Pt states he woke up at 0200 went to void, had hematuria, became lightheaded after leaving bathroom. Also reports lower abd pain, and R testicle pain. Per [facility] pt became diaphoretic when loading up for transport. EKG unremarkable. VSS. Hx enlarged prostate. Pt alert& oriented.

## 2017-11-17 NOTE — ED Provider Notes (Addendum)
Signed out to check ct when back.  CT with no intrabd pathology. Gu inflam changes noted.   Pt presents with urinary symptoms consistent with epididymitis/uti.  ua is positive for infection. Culture sent. Rocephin iv.   Additional ns iv bolus.   abd soft nt. No vomiting.   Pt has f/u with his urologist, Dr Annabell Howells this week already arranged.   On initial attempting discharge, pt with faintness/dizziness upon standing, and recurrent chills/sweats. Feels too weak to walk or go home.  Added lactate, additional ivf.  Pt remains nauseated, weak, faint when stands, lactate elevated after 2+ liters fluid. Will admit. Prefers [facility]. Hospitalsts consulted.      Results for orders placed or performed during the hospital encounter of 11/17/17  Urinalysis, Routine w reflex microscopic  Result Value Ref Range   Color, Urine BROWN (A) YELLOW   APPearance CLOUDY (A) CLEAR   Specific Gravity, Urine 1.020 1.005 - 1.030   pH 8.0 5.0 - 8.0   Glucose, UA NEGATIVE NEGATIVE mg/dL   Hgb urine dipstick LARGE (A) NEGATIVE   Bilirubin Urine NEGATIVE NEGATIVE   Ketones, ur 15 (A) NEGATIVE mg/dL   Protein, ur 161 (A) NEGATIVE mg/dL   Nitrite POSITIVE (A) NEGATIVE   Leukocytes, UA LARGE (A) NEGATIVE  CBC with Differential  Result Value Ref Range   WBC 15.2 (H) 4.0 - 10.5 K/uL   RBC 4.93 4.22 - 5.81 MIL/uL   Hemoglobin 15.4 13.0 - 17.0 g/dL   HCT 09.6 04.5 - 40.9 %   MCV 90.1 78.0 - 100.0 fL   MCH 31.2 26.0 - 34.0 pg   MCHC 34.7 30.0 - 36.0 g/dL   RDW 81.1 91.4 - 78.2 %   Platelets 189 150 - 400 K/uL   Neutrophils Relative % 84 %   Neutro Abs 12.9 (H) 1.7 - 7.7 K/uL   Lymphocytes Relative 8 %   Lymphs Abs 1.2 0.7 - 4.0 K/uL   Monocytes Relative 7 %   Monocytes Absolute 1.1 (H) 0.1 - 1.0 K/uL   Eosinophils Relative 1 %   Eosinophils Absolute 0.1 0.0 - 0.7 K/uL   Basophils Relative 0 %   Basophils Absolute 0.0 0.0 - 0.1 K/uL  Basic metabolic panel  Result Value Ref Range   Sodium 139 135 -  145 mmol/L   Potassium 4.7 3.5 - 5.1 mmol/L   Chloride 103 101 - 111 mmol/L   CO2 26 22 - 32 mmol/L   Glucose, Bld 140 (H) 65 - 99 mg/dL   BUN 16 6 - 20 mg/dL   Creatinine, Ser 9.56 0.61 - 1.24 mg/dL   Calcium 8.9 8.9 - 21.3 mg/dL   GFR calc non Af Amer >60 >60 mL/min   GFR calc Af Amer >60 >60 mL/min   Anion gap 10 5 - 15  Urinalysis, Microscopic (reflex)  Result Value Ref Range   RBC / HPF >50 0 - 5 RBC/hpf   WBC, UA >50 0 - 5 WBC/hpf   Bacteria, UA MANY (A) NONE SEEN   Squamous Epithelial / LPF 0-5 0 - 5   WBC Clumps PRESENT   I-Stat CG4 Lactic Acid, ED  Result Value Ref Range   Lactic Acid, Venous 2.59 (HH) 0.5 - 1.9 mmol/L   Comment NOTIFIED PHYSICIAN    Ct Renal Stone Study  Result Date: 11/17/2017 CLINICAL DATA:  57 year old male with hematuria upon voiding this morning. Became lightheaded. Lower abdominal pain. Right testicular pain. Post TURP. Initial encounter. EXAM: CT ABDOMEN AND PELVIS  WITHOUT CONTRAST TECHNIQUE: Multidetector CT imaging of the abdomen and pelvis was performed following the standard protocol without IV contrast. COMPARISON:  08/13/2014 CT. FINDINGS: Lower chest: Scarring lung bases greater on left. Heart size within normal limits. Right coronary artery calcifications. Hepatobiliary: Enlarged fatty liver spanning over 20.2 cm. Taking into account limitation by non contrast imaging, no worrisome hepatic lesion. No calcified gallstones. Pancreas: Tiny calcifications pancreatic level probably vascular in origin rather than related to chronic pancreatitis. Taking into account limitation by non contrast imaging, no worrisome pancreatic mass or inflammation. Spleen: Taking into account limitation by non contrast imaging, no splenic mass or enlargement. Adrenals/Urinary Tract: No adrenal mass. No obstructing stone or hydronephrosis. Taking into account limitation by non contrast imaging, no worrisome renal mass. Enlarged prostate gland impresses upon the bladder base.  Stomach/Bowel: Sigmoid diverticulosis. No extraluminal bowel inflammatory process. Long segment of colon under distended and evaluation limited. Tiny radiopaque material within noninflamed appearing appendix may represent ingested material or tiny stone. Vascular/Lymphatic: Calcified plaque aorta and iliac arteries. No abdominal aortic aneurysm. Mild ectasia common iliac arteries. Top-normal size lymph node adjacent to right seminal vesicle. Scattered normal size lymph nodes throughout the abdomen, pelvis and retroperitoneal region. Reproductive: Enlarged prostate gland with impression upon the bladder base. Enlarged prostate gland with impression upon the bladder base. Haziness of fat planes surrounding seminal vesicles bilaterally, slightly more notable on the right. Other: No free air or bowel containing hernia. Musculoskeletal: Facet degenerative changes greatest L3-4 level. Mild disc degeneration L1-2 through L5-S1. Mild right hip joint degenerative changes. IMPRESSION: No obstructing renal/ureteral calculi or hydronephrosis. Enlarged prostate gland with impression upon the bladder base. Clinical and laboratory correlation recommended. Haziness of fat planes surrounding seminal vesicles bilaterally, slightly more notable on the right. Slight increase in size of adjacent right-sided pelvic lymph node. Question seminal vesicle inflammation? Sigmoid diverticulosis. Enlarged fatty liver. Right coronary artery calcification. Aortic Atherosclerosis (ICD10-I70.0). Mild degenerative changes lumbar spine and right hip. Electronically Signed   By: Lacy DuverneySteven  Olson M.D.   On: 11/17/2017 07:49    Discussed pt with hospitalists/[facility] Dr Alvino ChapelHoi - who accepts for admit.        Cathren LaineSteinl, Ethelene Closser, MD 11/17/17 1421

## 2017-11-17 NOTE — ED Notes (Signed)
Pt lying flat for orthostatics 

## 2017-11-17 NOTE — ED Notes (Signed)
During d/c pt was up to wheel chair and became diaphoretic and pale, pt states near syncope; pt placed back in bed, wet cloth to face and EDP notified

## 2017-11-17 NOTE — H&P (Signed)
History and Physical    Charles [name] EXH:[id number] DOB: Feb 18, 1961 DOA: 11/17/2017  PCP: Darreld Mclean, MD   Patient coming from: Home  Chief Complaint: Blood in the urine, almost passing out, fevers, chills  HPI: Charles [name] is a 57 y.o. male with medical history significant of  BPH, CAD status post stenting, gout, history of urinary tract infection, hypertension, hyperlipidemia, degenerative disc disease, and other comorbidities who presented to [facility] after being transferred from [facility] for chief complaint of blood in his urine along with was passing out with associated with fevers and chills.  Patient states he was in his normal health and went to bed last night and around 2 AM he woke up and started feeling chills nausea and states that when he got out of bed he almost passed out.  He also reported a fever and got up to go to the restroom and noticed blood in his urine and subsequently continued to have bloody urine.  Patient felt general fatigue and reported lightheadedness and dizziness after his urination after standing up.  He does have  a history of urinary retention and prostate procedures.  Patient also complained about right testicular pain and states that symptoms started very abruptly.  No other concerns or complaints at this time.  Tried hospitalist was called to evaluate and admit this patient for his hematuria and sepsis secondary to urinary tract infection.  ED Course: He is given 2.5 Liters of Normal Saline in the ED, had a CT stone study, given acetaminophen, basic blood work done and was transferred to [place]: As per HPI otherwise 10 point review of systems negative.   Past Medical History:  Diagnosis Date  . BPH (benign prostatic hypertrophy) with urinary retention   . CAD (coronary artery disease) 04/2008   successful PCI of the lesion in the proximal LAD using xience drug-eluting stent with improvement in central  narrowing from 95% to 0%.  Dr. Olevia Perches  . Foley catheter in place   . Gout    per pt stable is of 08-18-2014  . History of acute bronchitis    recently 07-16-2014  . History of sepsis    urosepsis--  08-13-2014 admission  . Hyperlipidemia LDL goal <100   . Hypertension   . Multilevel degenerative disc disease    cervical and lumbar  . OSA (obstructive sleep apnea)       . Psoriasis   . S/P drug eluting coronary stent placement    05-13-2008   DES X1 TO pLAD  . Sigmoid diverticulosis     Past Surgical History:  Procedure Laterality Date  . BUNIONECTOMY Right 2000  . COLONOSCOPY WITH PROPOFOL  last one 10-06-2013  . CORONARY ANGIOPLASTY WITH STENT PLACEMENT  05-13-2008    Dr Eustace Quail   PCI w/  DES x1 to  pLAD/  ef 65%  . GREEN LIGHT LASER TURP (TRANSURETHRAL RESECTION OF PROSTATE N/A 08/23/2014   Procedure: GREEN LIGHT LASER TURP (TRANSURETHRAL RESECTION OF PROSTATE;  Surgeon: Irine Seal, MD;  Location: [facility];  Service: Urology;  Laterality: N/A;  . WRIST GANGLION EXCISION Left 2001   SOCIAL HISTORY  reports that he quit smoking about 20 years ago. His smoking use included cigarettes. He has a 10.50 pack-year smoking history. He quit smokeless tobacco use about 20 years ago. His smokeless tobacco use included snuff. He reports that he drinks about 1.2 oz of alcohol per week. He reports that  he does not use drugs.  Allergies  Allergen Reactions  . Lidocaine Anaphylaxis    REACTION: anaphylactic shock (also he was on Neomycin sulfate)  . Neomycin Sulfate Anaphylaxis    REACTION: anaphylactic shock (also on Lidocaine  topically)  . Sulfa Antibiotics Anaphylaxis  . Sulfa Drugs Cross Reactors Anaphylaxis  . Flexeril [Cyclobenzaprine] Other (See Comments)    05/26/13 urinary retention  . Hctz [Hydrochlorothiazide] Other (See Comments)    Gout   . Tramadol Other (See Comments)    05/26/13 urinary retention  . Atrovent [Ipratropium] Other (See Comments)     Caused Urinary retention (this is the nasal spray)  . Latex Itching and Rash    Skin irritation   Family History  Problem Relation Age of Onset  . Hypertension Father   . Stroke Father 33  . Lung cancer Mother         smoker  . Diabetes Sister   . Heart disease Brother 51        stent LAD 2012  . Colon cancer Neg Hx   . Pancreatic cancer Neg Hx   . Rectal cancer Neg Hx   . Stomach cancer Neg Hx    Prior to Admission medications   Medication Sig Start Date End Date Taking? Authorizing Provider  allopurinol (ZYLOPRIM) 300 MG tablet TAKE 1 TABLET BY MOUTH EVERY DAY 06/20/17  Yes Copland, Gay Filler, MD  aspirin 325 MG EC tablet Take 325 mg by mouth daily.   Yes [provider]  Aspirin-Acetaminophen-Caffeine (EXCEDRIN PO) Take 2 tablets by mouth daily as needed (headache).   Yes [provider]  Carboxymethylcellul-Glycerin 0.5-0.9 % SOLN Place 2 drops into both eyes 2 (two) times daily. 07/31/16  Yes Golden Circle, FNP  cetirizine (ZYRTEC) 10 MG tablet Take 10 mg by mouth at bedtime.   Yes [provider]  Coenzyme Q10 (CO Q 10 PO) Take 1 capsule by mouth every morning. 590m   Yes [provider]  FLUoxetine (PROZAC) 20 MG capsule TAKE 1 CAPSULE DAILY---  takes in am 06/24/17  Yes Copland, JGay Filler MD  fluticasone (FLONASE) 50 MCG/ACT nasal spray Place 1 spray into both nostrils daily as needed for allergies or rhinitis.   Yes [provider]  ibuprofen (ADVIL,MOTRIN) 200 MG tablet Take 200 mg by mouth every 6 (six) hours as needed. 2 tabs=4036mper dose for muscle aches   Yes [provider]  metoprolol succinate (TOPROL-XL) 100 MG 24 hr tablet Take 1 tablet (100 mg total) by mouth daily. 09/22/17  Yes Copland, JeGay FillerMD  Multiple Vitamin (MULTIVITAMIN) capsule Take 1 capsule by mouth every morning.    Yes [provider]  Omega-3 Fatty Acids (FISH OIL) 1000 MG CAPS Take 1,000 mg by mouth every morning.    Yes [provider]  rosuvastatin (CRESTOR) 20 MG tablet Take 1 tablet (20 mg total) by mouth daily. 09/22/17  Yes Copland, JeGay FillerMD  cephALEXin (KEFLEX) 500 MG capsule Take 1 capsule (500 mg total) by mouth 4 (four) times daily. 11/17/17   StLajean SaverMD  clobetasol cream (TEMOVATE) 0.7.42 APPLY 1 APPLICATION TOPICALLY TWO TIMES A DAY AS NEEDED 09/29/17   Copland, JeGay FillerMD  fluocinonide (LIDEX) 0.05 % external solution Apply 1 application topically 2 (two) times daily. 09/22/17   Copland, JeGay FillerMD  fluocinonide cream (LIDEX) 0.5.95 Apply 1 application topically 2 (two) times daily. 09/22/17   Copland, JeGay FillerMD  nitroGLYCERIN (NITROSTAT)  0.3 MG SL tablet Place 1 tablet (0.3 mg total) under the tongue every 5 (five) minutes as needed for chest pain. 09/22/17   Copland, Gay Filler, MD   Physical Exam: Vitals:   11/17/17 1437 11/17/17 1500 11/17/17 1502 11/17/17 1631  BP:  122/71 122/71 122/66  Pulse:  82 84 85  Resp:  (!) 21 18 18   Temp: (!) 100.6 F (38.1 C)  99 F (37.2 C) (!) 100.8 F (38.2 C)  TempSrc: Oral  Oral Oral  SpO2:  94% 94% 97%  Weight:      Height:       Constitutional: WN/WD Caucasian Male in NAD and appears calm and comfortable Eyes: Lids and conjunctivae normal, sclerae anicteric  ENMT: External Ears, Nose appear normal. Grossly normal hearing. Mucous membranes are a little dry. Neck: Appears normal, supple, no cervical masses, normal ROM, no appreciable thyromegaly, no JVD Respiratory: Diminished to auscultation bilaterally, no wheezing, rales, rhonchi or crackles.  Cardiovascular: RRR, no murmurs / rubs / gallops. S1 and S2 auscultated. No extremity edema.  Abdomen: Soft, Tender in the lower abdomen, non-distended. No masses palpated. No appreciable hepatosplenomegaly. Bowel sounds positive x4.  GU: Has some mild scrotal erythema with tenderness on palpation of specific on the right side. Musculoskeletal: No clubbing / cyanosis of digits/nails. Good ROM, no  contractures.  Skin: Has Psoriasis scattered diffusely throughout the body. No induration; Warm and dry.  Neurologic: CN 2-12 grossly intact with no focal deficits.  Romberg sign and cerebellar reflexes not assessed.  Psychiatric: Normal judgment and insight. Alert and oriented x 3. Normal mood and appropriate affect.   Labs on Admission: I have personally reviewed following labs and imaging studies  CBC: Recent Labs  Lab 11/17/17 0630 11/17/17 1731  WBC 15.2* 15.8*  NEUTROABS 12.9*  --   HGB 15.4 14.7  HCT 44.4 43.2  MCV 90.1 90.8  PLT 189 992   Basic Metabolic Panel: Recent Labs  Lab 11/17/17 0630 11/17/17 1731  NA 139  --   K 4.7  --   CL 103  --   CO2 26  --   GLUCOSE 140*  --   BUN 16  --   CREATININE 1.19 1.06  CALCIUM 8.9  --    GFR: Estimated Creatinine Clearance: 91.4 mL/min (by C-G formula based on SCr of 1.06 mg/dL). Liver Function Tests: No results for input(s): AST, ALT, ALKPHOS, BILITOT, PROT, ALBUMIN in the last 168 hours. No results for input(s): LIPASE, AMYLASE in the last 168 hours. No results for input(s): AMMONIA in the last 168 hours. Coagulation Profile: Recent Labs  Lab 11/17/17 1731  INR 1.18   Cardiac Enzymes: No results for input(s): CKTOTAL, CKMB, CKMBINDEX, TROPONINI in the last 168 hours. BNP (last 3 results) No results for input(s): PROBNP in the last 8760 hours. HbA1C: No results for input(s): HGBA1C in the last 72 hours. CBG: No results for input(s): GLUCAP in the last 168 hours. Lipid Profile: No results for input(s): CHOL, HDL, LDLCALC, TRIG, CHOLHDL, LDLDIRECT in the last 72 hours. Thyroid Function Tests: Recent Labs    11/17/17 1731  TSH 0.644   Anemia Panel: No results for input(s): VITAMINB12, FOLATE, FERRITIN, TIBC, IRON, RETICCTPCT in the last 72 hours. Urine analysis:    Component Value Date/Time   COLORURINE BROWN (A) 11/17/2017 0804   APPEARANCEUR CLOUDY (A) 11/17/2017 0804   LABSPEC 1.020 11/17/2017 0804     PHURINE 8.0 11/17/2017 0804   GLUCOSEU NEGATIVE 11/17/2017 0804   HGBUR  LARGE (A) 11/17/2017 0804   BILIRUBINUR NEGATIVE 11/17/2017 0804   KETONESUR 15 (A) 11/17/2017 0804   PROTEINUR 100 (A) 11/17/2017 0804   UROBILINOGEN 1.0 08/13/2014 2049   NITRITE POSITIVE (A) 11/17/2017 0804   LEUKOCYTESUR LARGE (A) 11/17/2017 0804   Sepsis Labs: !!!!!!!!!!!!!!!!!!!!!!!!!!!!!!!!!!!!!!!!!!!! @LABRCNTIP (procalcitonin:4,lacticidven:4) )No results found for this or any previous visit (from the past 240 hour(s)).   Radiological Exams on Admission: Ct Renal Stone Study  Result Date: 11/17/2017 CLINICAL DATA:  57 year old male with hematuria upon voiding this morning. Became lightheaded. Lower abdominal pain. Right testicular pain. Post TURP. Initial encounter. EXAM: CT ABDOMEN AND PELVIS WITHOUT CONTRAST TECHNIQUE: Multidetector CT imaging of the abdomen and pelvis was performed following the standard protocol without IV contrast. COMPARISON:  08/13/2014 CT. FINDINGS: Lower chest: Scarring lung bases greater on left. Heart size within normal limits. Right coronary artery calcifications. Hepatobiliary: Enlarged fatty liver spanning over 20.2 cm. Taking into account limitation by non contrast imaging, no worrisome hepatic lesion. No calcified gallstones. Pancreas: Tiny calcifications pancreatic level probably vascular in origin rather than related to chronic pancreatitis. Taking into account limitation by non contrast imaging, no worrisome pancreatic mass or inflammation. Spleen: Taking into account limitation by non contrast imaging, no splenic mass or enlargement. Adrenals/Urinary Tract: No adrenal mass. No obstructing stone or hydronephrosis. Taking into account limitation by non contrast imaging, no worrisome renal mass. Enlarged prostate gland impresses upon the bladder base. Stomach/Bowel: Sigmoid diverticulosis. No extraluminal bowel inflammatory process. Long segment of colon under distended and evaluation  limited. Tiny radiopaque material within noninflamed appearing appendix may represent ingested material or tiny stone. Vascular/Lymphatic: Calcified plaque aorta and iliac arteries. No abdominal aortic aneurysm. Mild ectasia common iliac arteries. Top-normal size lymph node adjacent to right seminal vesicle. Scattered normal size lymph nodes throughout the abdomen, pelvis and retroperitoneal region. Reproductive: Enlarged prostate gland with impression upon the bladder base. Enlarged prostate gland with impression upon the bladder base. Haziness of fat planes surrounding seminal vesicles bilaterally, slightly more notable on the right. Other: No free air or bowel containing hernia. Musculoskeletal: Facet degenerative changes greatest L3-4 level. Mild disc degeneration L1-2 through L5-S1. Mild right hip joint degenerative changes. IMPRESSION: No obstructing renal/ureteral calculi or hydronephrosis. Enlarged prostate gland with impression upon the bladder base. Clinical and laboratory correlation recommended. Haziness of fat planes surrounding seminal vesicles bilaterally, slightly more notable on the right. Slight increase in size of adjacent right-sided pelvic lymph node. Question seminal vesicle inflammation? Sigmoid diverticulosis. Enlarged fatty liver. Right coronary artery calcification. Aortic Atherosclerosis (ICD10-I70.0). Mild degenerative changes lumbar spine and right hip. Electronically Signed   By: Genia Del M.D.   On: 11/17/2017 07:49   US Scrotum W/doppler  Result Date: 11/17/2017 CLINICAL DATA:  Right-sided testicular pain since 2 a.m. Hematuria and fever. History of TURP in 2016. EXAM: SCROTAL ULTRASOUND DOPPLER ULTRASOUND OF THE TESTICLES TECHNIQUE: Complete ultrasound examination of the testicles, epididymis, and other scrotal structures was performed. Color and spectral Doppler ultrasound were also utilized to evaluate blood flow to the testicles. COMPARISON:  None. FINDINGS: Right testicle  Measurements: 4 x 2.5 x 2.9 cm. No mass or microlithiasis visualized. Left testicle Measurements: 4.4 x 2.1 x 2.6 cm. No mass or microlithiasis visualized. Right epididymis: Normal in size and appearance. Simple 4 mm epididymal head cyst is noted. Left epididymis: Normal in size and appearance. Simple 4 and 5 mm epididymal head cysts. Hydrocele:  Small bilateral hydroceles. Varicocele:  None visualized. Pulsed Doppler interrogation of both testes demonstrates normal low  resistance arterial and venous waveforms bilaterally. IMPRESSION: 1. No intratesticular mass or evidence of torsion. No significant hypervascularity of either testicle to suggest orchitis or of the epididymi to suggest epididymitis. 2. Small simple epididymal head cysts measuring between 4 and 5 mm. 3. Small bilateral hydroceles. Electronically Signed   By: Ashley Royalty M.D.   On: 11/17/2017 19:03   EKG: Independently reviewed. Showed Sinus Rhythm at a rate of 81 with no ST Elevation on my interpretation.   Assessment/Plan Active Problems:   Hyperlipidemia   Essential hypertension   CORONARY ATHEROSCLEROSIS, NATIVE VESSEL   G E R D   Psoriasis   H/O urinary retention   Gout   Sepsis secondary to UTI [facility])   UTI (urinary tract infection)  Sepsis 2/2 to Urinary Tract Infection with Hematuria -Admitted to telemetry -Had a fever on admission along with a leukocytosis and tachypnea initially met sepsis criteria -Urinalysis showed cloudy appearance with brown color urine, large hemoglobin, large leukocytes, positive nitrites, many bacteria, and greater than 50 WBCs -Urine cultures pending -CT stone studies showed No obstructing renal/ureteral calculi or hydronephrosis. Enlarged prostate gland with impression upon the bladder base. Clinical and laboratory correlation recommended. Haziness of fat planes surrounding seminal vesicles bilaterally, slightly more notable on the right. Slight increase in size of adjacent right-sided pelvic  lymph node. Question seminal vesicle Inflammation? Sigmoid diverticulosis. Enlarged fatty liver Right coronary artery calcification. Aortic Atherosclerosis. Mild degenerative changes lumbar spine and right hip. -No blood cultures obtained in the ED so we will obtain -Given 2.5 L of IV fluid boluses in the ED; start the patient on maintenance IV fluid at 100 mL's per hour -Continue with empiric antibiotics with IV Ceftriaxone 1 g every 24 hours -Follow-up and urine cultures  Lactic acidosis -Trending downwards -Continue with IV fluid resuscitation with normal saline at a rate of 100 mL/hr  Presyncope in the setting of infection -Continue with fluid resuscitation -Check orthostatic vital signs in a.m. -Currently has no neurological symptoms  BPH with a history of urinary retention -CT scan noted to have an enlarged prostate with impression upon the bladder base -May need Foley catheter -Check bladder scan -Patient has an elevated PSA of 4.2 and was supposed to see Dr. Jeffie Pollock of urology later this week -We will follow-up and discuss with urology in the a.m.  Right testicular pain -Could be related to UTI however will obtain a scrotal ultrasound with Dopplers to rule out torsion -CT scan showed haziness of the fat planes surrounding the seminal vesicles bilaterally slightly more notable on right or patient is having pain.  Slightly increased in size of adjacent right-sided pelvic lymph node and there is questionable  seminal vesicle inflammation -Follow scrotal ultrasound  Essential Hypertension -Continue with metoprolol succinate 100 mg p.o. daily  Coronary artery disease -Continue with aspirin 325 mg p.o. daily, metoprolol succinate 100 mg tablet p.o. daily, rosuvastatin 20 mg p.o. Nightly -Continue with nitroglycerin 0.3 mg sublingually every 5 minutes as needed for chest pain  Hyperlipidemia -Continue with rosuvastatin 20 mill grams p.o. nightly  GERD -Patient states he has  intermittent symptoms and does not take medications regularly -States GERD flares of about once a month and takes usually PRN Tums  Gout -Continue allopurinol 300 mg p.o. Daily  Fatty liver disease -Seen on CT scan -Continue to monitor and trend CMP's  DVT prophylaxis: Heparin 5,000 units sq q8h Code Status: FULL CODE Family Communication: No family present at bedside  Disposition Plan: Remain inpatient for current  work-up and anticipate discharge home when medically stable Consults called: None Admission status: Inpatient Telemetry  Severity of Illness: The appropriate patient status for this patient is INPATIENT. Inpatient status is judged to be reasonable and necessary in order to provide the required intensity of service to ensure the patient's safety. The patient's presenting symptoms, physical exam findings, and initial radiographic and laboratory data in the context of their chronic comorbidities is felt to place them at high risk for further clinical deterioration. Furthermore, it is not anticipated that the patient will be medically stable for discharge from the hospital within 2 midnights of admission. The following factors support the patient status of inpatient.   " The patient's presenting symptoms include Urinary Burining, Hematuria, Pre-Syncope and Right Testicular Pain. " The worrisome physical exam findings include Scrotal Pain and Lower Abdominal Pain. " The initial radiographic and laboratory data are worrisome because of enlarged prostate with impression on the bladder base along with questionable seminal vesicle inflammation. " The chronic co-morbidities include BPH and Hx of Urinary Retention, CAD, HLD, Hypertension.  * I certify that at the point of admission it is my clinical judgment that the patient will require inpatient hospital care spanning beyond 2 midnights from the point of admission due to high intensity of service, high risk for further deterioration and  high frequency of surveillance required.Kerney Elbe, D.O. [facility] Pager [phone]  If 7PM-7AM, please contact night-coverage [email or web address] Password [facility]  11/17/2017, 7:09 PM

## 2017-11-17 NOTE — ED Notes (Signed)
Returned from CT.

## 2017-11-18 DIAGNOSIS — R338 Other retention of urine: Secondary | ICD-10-CM

## 2017-11-18 LAB — COMPREHENSIVE METABOLIC PANEL
ALBUMIN: 3.5 g/dL (ref 3.5–5.0)
ALK PHOS: 44 U/L (ref 38–126)
ALT: 18 U/L (ref 0–44)
ANION GAP: 7 (ref 5–15)
AST: 16 U/L (ref 15–41)
BILIRUBIN TOTAL: 1.1 mg/dL (ref 0.3–1.2)
BUN: 12 mg/dL (ref 6–20)
CALCIUM: 7.6 mg/dL — AB (ref 8.9–10.3)
CO2: 25 mmol/L (ref 22–32)
CREATININE: 0.92 mg/dL (ref 0.61–1.24)
Chloride: 107 mmol/L (ref 98–111)
GFR calc Af Amer: >60 mL/min (ref >60)
GFR calc non Af Amer: >60 mL/min (ref >60)
GLUCOSE: 112 mg/dL — AB (ref 70–99)
Potassium: 3.7 mmol/L (ref 3.5–5.1)
Sodium: 139 mmol/L (ref 135–145)
TOTAL PROTEIN: 6.4 g/dL — AB (ref 6.5–8.1)

## 2017-11-18 LAB — CBC
HCT: 41.5 % (ref 39.0–52.0)
HEMOGLOBIN: 13.6 g/dL (ref 13.0–17.0)
MCH: 30.5 pg (ref 26.0–34.0)
MCHC: 32.8 g/dL (ref 30.0–36.0)
MCV: 93 fL (ref 78.0–100.0)
PLATELETS: 177 10*3/uL (ref 150–400)
RBC: 4.46 MIL/uL (ref 4.22–5.81)
RDW: 13.6 % (ref 11.5–15.5)
WBC: 15.4 10*3/uL — ABNORMAL HIGH (ref 4.0–10.5)

## 2017-11-18 LAB — GLUCOSE, CAPILLARY: GLUCOSE-CAPILLARY: 110 mg/dL — AB (ref 70–99)

## 2017-11-18 LAB — HIV ANTIBODY (ROUTINE TESTING W REFLEX): HIV Screen 4th Generation wRfx: NONREACTIVE

## 2017-11-18 MED ORDER — IBUPROFEN 200 MG PO TABS
400.0000 mg | ORAL_TABLET | Freq: Once | ORAL | Status: AC
Start: 2017-11-18 — End: 2017-11-18
  Administered 2017-11-18: 400 mg via ORAL
  Filled 2017-11-18: qty 2

## 2017-11-18 MED ORDER — TAMSULOSIN HCL 0.4 MG PO CAPS
0.4000 mg | ORAL_CAPSULE | Freq: Every day | ORAL | Status: DC
  Administered 2017-11-18 – 2017-11-19 (×2): 0.4 mg via ORAL
  Filled 2017-11-18 (×2): qty 1

## 2017-11-18 MED ORDER — FINASTERIDE 5 MG PO TABS
5.0000 mg | ORAL_TABLET | Freq: Every day | ORAL | Status: DC
  Administered 2017-11-18 – 2017-11-20 (×3): 5 mg via ORAL
  Filled 2017-11-18 (×3): qty 1

## 2017-11-18 MED ORDER — SODIUM CHLORIDE 0.9 % IV SOLN
1.0000 g | Freq: Once | INTRAVENOUS | Status: AC
  Administered 2017-11-18: 1 g via INTRAVENOUS
  Filled 2017-11-18: qty 10

## 2017-11-18 MED ORDER — SODIUM CHLORIDE 0.9 % IV BOLUS
500.0000 mL | Freq: Once | INTRAVENOUS | Status: AC
  Administered 2017-11-18: 500 mL via INTRAVENOUS

## 2017-11-18 NOTE — Progress Notes (Signed)
PROGRESS NOTE    [name]  CHE:[id number] DOB: Jan 26, 1961 DOA: 11/17/2017 PCP: Darreld Mclean, MD   Brief Narrative:  Charles [name] is a 57 y.o. male with medical history significant of  BPH, CAD status post stenting, gout, history of urinary tract infection, hypertension, hyperlipidemia, degenerative disc disease, and other comorbidities who presented to [facility] after being transferred from [facility] for chief complaint of blood in his urine along with was passing out with associated with fevers and chills.  Patient states he was in his normal health and went to bed last night and around 2 AM he woke up and started feeling chills nausea and states that when he got out of bed he almost passed out.  He also reported a fever and got up to go to the restroom and noticed blood in his urine and subsequently continued to have bloody urine.  Patient felt general fatigue and reported lightheadedness and dizziness after his urination after standing up.  He does have  a history of urinary retention and prostate procedures.  Patient also complained about right testicular pain and states that symptoms started very abruptly.  No other concerns or complaints at this time.  TRH was called to evaluate and admit this patient for his hematuria and sepsis secondary to urinary tract infection.  Assessment & Plan:   Active Problems:   Hyperlipidemia   Essential hypertension   CORONARY ATHEROSCLEROSIS, NATIVE VESSEL   G E R D   Psoriasis   H/O urinary retention   Gout   Sepsis secondary to UTI ([place])   UTI (urinary tract infection)   Pre-syncope  Sepsis 2/2 to Urinary Tract Infection with Hematuria -Admitted to telemetry -Had a fever on admission along with a leukocytosis and tachypnea initially met sepsis criteria -Sepsis physiology is improving however patient still spiked a small temperature -Urinalysis showed cloudy appearance with brown color urine, large hemoglobin, large  leukocytes, positive nitrites, many bacteria, and greater than 50 WBCs -Urine cultures showed >100,000 CFU of E Coli with Sensitivities pending -CT stone studies showed No obstructing renal/ureteral calculi or hydronephrosis. Enlarged prostate gland with impression upon the bladder base. Clinical and laboratory correlation recommended. Haziness of fat planes surrounding seminal vesicles bilaterally, slightly more notable on the right. Slight increase in size of adjacent right-sided pelvic lymph node. Question seminal vesicle Inflammation? Sigmoid diverticulosis. Enlarged fatty liver Right coronary artery calcification. Aortic Atherosclerosis. Mild degenerative changes lumbar spine and right hip. -No blood cultures obtained in the ED so we will obtain; Blood Cx x2 showed NGTD <24 hours  -Procalcitonin level was 0.29 and lactic acid levels are below -WBC went from 15.8 is now 15.4 -Given 2.5 L of IV fluid boluses in the ED; start the patient on maintenance IV fluid at 100 mL's per hour and will continue  -Continue with empiric antibiotics with IV Ceftriaxone 1 g every 24 hours -Follow-up and urine cultures  Lactic Acidosis -Fluctuating. Was initially Trending downwards but then went 2.59 -> 2.76 -> 2.2 -> 3.2 -> 2.3 -Continue with IV fluid resuscitation with normal saline at a rate of 100 mL/hr; had to be given a bolus early this morning  Acute Urinary Retention -Bladder scan done post void residual and was 537 mL's -Placed indwelling Foley catheter and start the patient on tamsulosin -Patient has an appointment with Dr. Jeffie Pollock of Urology on Thursday, 11/20/2017 at 3:30 PM if his discharge by then was told to follow-up with him directly following hospitalization for trial of  void and follow-up for elevated PSA  Presyncope in the setting of infection -Continue with fluid resuscitation with NS at 100 mL/hr -Checked orthostatic vital signs last night and was normal -Currently has no neurological  symptoms  BPH with a history of urinary retention -CT scan noted to have an enlarged prostate with impression upon the bladder base -Will insert Foley catheter given Urinary Retention -Checked bladder scan -Patient has an elevated PSA of 4.2 and was supposed to see Dr. Jeffie Pollock of urology later this week as above  Right testicular pain, improving -Could be related to UTI however will obtain a scrotal ultrasound with Dopplers to rule out torsion -CT scan showed haziness of the fat planes surrounding the seminal vesicles bilaterally slightly more notable on right or patient is having pain.  Slightly increased in size of adjacent right-sided pelvic lymph node and there is questionable  seminal vesicle inflammation -Scrotal U/S showed No intratesticular mass or evidence of torsion. No significant hypervascularity of either testicle to suggest orchitis or of the epididymi to suggest epididymitis. Small simple epididymal head cystsmeasuring between 4 and 5 mm. Small bilateral hydroceles  Essential Hypertension -Continue with Metoprolol Succinate 100 mg p.o. daily  Coronary Artery Disease -Continue with Aspirin 325 mg p.o. daily, metoprolol succinate 100 mg tablet p.o. daily, rosuvastatin 20 mg p.o. Nightly -Continue with nitroglycerin 0.3 mg sublingually every 5 minutes as needed for chest pain  Hyperlipidemia -Continue with Rosuvastatin 20 mill grams p.o. nightly  GERD -Patient states he has intermittent symptoms and does not take medications regularly -States GERD flares of about once a month and takes usually PRN Tums  Gout -Continue Allopurinol 300 mg p.o. Daily  Fatty Liver Disease -Seen on CT scan -Continue to monitor and trend CMP's; LFT's showed AST of 16, and ALT of 18 -Diet education given  Hypocalcemia -Patient's Ca2+ was 7.6 this AM -Gram of IV calcium gluconate -Continue to monitor and replete as necessary -Repeat CMP in a.m.  DVT prophylaxis: Heparin 5,000  units sq q8h Code Status: FULL CODE Family Communication: No family present at bedside Disposition Plan: Remain inpatient for current treatment and work-up and anticipate discharge in the next 24 to 48 hours pending urine culture sensitivities  Consultants:   None   Procedures: None   Antimicrobials:  Anti-infectives (From admission, onward)   Start     Dose/Rate Route Frequency Ordered Stop   11/17/17 2200  cefTRIAXone (ROCEPHIN) 2 g in sodium chloride 0.9 % 100 mL IVPB     2 g 200 mL/hr over 30 Minutes Intravenous Every 24 hours 11/17/17 1702     11/17/17 0815  cefTRIAXone (ROCEPHIN) 1 g in sodium chloride 0.9 % 100 mL IVPB     1 g 200 mL/hr over 30 Minutes Intravenous  Once 11/17/17 0801 11/17/17 0945   11/17/17 0000  cephALEXin (KEFLEX) 500 MG capsule     500 mg Oral 4 times daily 11/17/17 0957       Subjective: Seen and examined states he felt "washed out."  Was feeling better slightly and states his abdominal pain was less.  No chest pain, shortness breath, nausea, vomiting. Got a little concerned when he found out he had acute urinary retention. No other concerns or complaints at this time and felt okay.  Objective: Vitals:   11/17/17 1631 11/17/17 2129 11/18/17 0125 11/18/17 0542  BP: 122/66 132/67 121/68 131/75  Pulse: 85  76 82  Resp: _0 Temp: (!) 100.8 F (38.2 C) 99 F (  37.2 C) 99.7 F (37.6 C) 100.1 F (37.8 C)  TempSrc: Oral Oral Oral Oral  SpO2: 97%  99% 97%  Weight:    114.5 kg (252 lb 6.8 oz)  Height:        Intake/Output Summary (Last 24 hours) at 11/18/2017 0805 Last data filed at 11/18/2017 0700 Gross per 24 hour  Intake 4314.13 ml  Output 1400 ml  Net 2914.13 ml   Filed Weights   11/17/17 0631 11/18/17 0542  Weight: 110.2 kg (243 lb) 114.5 kg (252 lb 6.8 oz)   Examination: Physical Exam:  Constitutional: WN/WD Caucasian Male NAD and appears calm and comfortable Eyes: Lids and conjunctivae normal, sclerae anicteric  ENMT:  External Ears, Nose appear normal. Grossly normal hearing.  Neck: Appears normal, supple, no cervical masses, normal ROM, no appreciable thyromegaly, no JVD Respiratory: Diminished to auscultation bilaterally, no wheezing, rales, rhonchi or crackles. Normal respiratory effort and patient is not tachypenic. No accessory muscle use.  Cardiovascular: RRR, no murmurs / rubs / gallops. S1 and S2 auscultated. No extremity edema.   Abdomen: Soft, slightly tender, non-distended. No masses palpated. No appreciable hepatosplenomegaly. Bowel sounds positive x4.  GU: Deferred. Musculoskeletal: No clubbing / cyanosis of digits/nails. No joint deformity upper and lower extremities. Skin: No rashes, lesions, ulcers on a limited skin evaluation. No induration; Warm and dry.  Neurologic: CN 2-12 grossly intact with no focal deficits. Romberg sign and cerebellar reflexes not assessed.  Psychiatric: Normal judgment and insight. Alert and oriented x 3. Normal mood and appropriate affect.   Data Reviewed: I have personally reviewed following labs and imaging studies  CBC: Recent Labs  Lab 11/17/17 0630 11/17/17 1731 11/18/17 0512  WBC 15.2* 15.8* 15.4*  NEUTROABS 12.9*  --   --   HGB 15.4 14.7 13.6  HCT 44.4 43.2 41.5  MCV 90.1 90.8 93.0  PLT 189 201 163   Basic Metabolic Panel: Recent Labs  Lab 11/17/17 0630 11/17/17 1731 11/18/17 0512  NA 139  --  139  K 4.7  --  3.7  CL 103  --  107  CO2 26  --  25  GLUCOSE 140*  --  112*  BUN 16  --  12  CREATININE 1.19 1.06 0.92  CALCIUM 8.9  --  7.6*   GFR: Estimated Creatinine Clearance: 107.5 mL/min (by C-G formula based on SCr of 0.92 mg/dL). Liver Function Tests: Recent Labs  Lab 11/18/17 0512  AST 16  ALT 18  ALKPHOS 44  BILITOT 1.1  PROT 6.4*  ALBUMIN 3.5   No results for input(s): LIPASE, AMYLASE in the last 168 hours. No results for input(s): AMMONIA in the last 168 hours. Coagulation Profile: Recent Labs  Lab 11/17/17 1731  INR  1.18   Cardiac Enzymes: No results for input(s): CKTOTAL, CKMB, CKMBINDEX, TROPONINI in the last 168 hours. BNP (last 3 results) No results for input(s): PROBNP in the last 8760 hours. HbA1C: No results for input(s): HGBA1C in the last 72 hours. CBG: Recent Labs  Lab 11/18/17 0738  GLUCAP 110*   Lipid Profile: No results for input(s): CHOL, HDL, LDLCALC, TRIG, CHOLHDL, LDLDIRECT in the last 72 hours. Thyroid Function Tests: Recent Labs    11/17/17 1731  TSH 0.644   Anemia Panel: No results for input(s): VITAMINB12, FOLATE, FERRITIN, TIBC, IRON, RETICCTPCT in the last 72 hours. Sepsis Labs: Recent Labs  Lab 11/17/17 1443 11/17/17 1731 11/17/17 1907 11/17/17 2241  PROCALCITON  --  0.29  --   --  LATICACIDVEN 2.76* 2.2* 3.2* 2.3*    No results found for this or any previous visit (from the past 240 hour(s)).   Radiology Studies: Ct Renal Stone Study  Result Date: 11/17/2017 CLINICAL DATA:  57 year old male with hematuria upon voiding this morning. Became lightheaded. Lower abdominal pain. Right testicular pain. Post TURP. Initial encounter. EXAM: CT ABDOMEN AND PELVIS WITHOUT CONTRAST TECHNIQUE: Multidetector CT imaging of the abdomen and pelvis was performed following the standard protocol without IV contrast. COMPARISON:  08/13/2014 CT. FINDINGS: Lower chest: Scarring lung bases greater on left. Heart size within normal limits. Right coronary artery calcifications. Hepatobiliary: Enlarged fatty liver spanning over 20.2 cm. Taking into account limitation by non contrast imaging, no worrisome hepatic lesion. No calcified gallstones. Pancreas: Tiny calcifications pancreatic level probably vascular in origin rather than related to chronic pancreatitis. Taking into account limitation by non contrast imaging, no worrisome pancreatic mass or inflammation. Spleen: Taking into account limitation by non contrast imaging, no splenic mass or enlargement. Adrenals/Urinary Tract: No adrenal  mass. No obstructing stone or hydronephrosis. Taking into account limitation by non contrast imaging, no worrisome renal mass. Enlarged prostate gland impresses upon the bladder base. Stomach/Bowel: Sigmoid diverticulosis. No extraluminal bowel inflammatory process. Long segment of colon under distended and evaluation limited. Tiny radiopaque material within noninflamed appearing appendix may represent ingested material or tiny stone. Vascular/Lymphatic: Calcified plaque aorta and iliac arteries. No abdominal aortic aneurysm. Mild ectasia common iliac arteries. Top-normal size lymph node adjacent to right seminal vesicle. Scattered normal size lymph nodes throughout the abdomen, pelvis and retroperitoneal region. Reproductive: Enlarged prostate gland with impression upon the bladder base. Enlarged prostate gland with impression upon the bladder base. Haziness of fat planes surrounding seminal vesicles bilaterally, slightly more notable on the right. Other: No free air or bowel containing hernia. Musculoskeletal: Facet degenerative changes greatest L3-4 level. Mild disc degeneration L1-2 through L5-S1. Mild right hip joint degenerative changes. IMPRESSION: No obstructing renal/ureteral calculi or hydronephrosis. Enlarged prostate gland with impression upon the bladder base. Clinical and laboratory correlation recommended. Haziness of fat planes surrounding seminal vesicles bilaterally, slightly more notable on the right. Slight increase in size of adjacent right-sided pelvic lymph node. Question seminal vesicle inflammation? Sigmoid diverticulosis. Enlarged fatty liver. Right coronary artery calcification. Aortic Atherosclerosis (ICD10-I70.0). Mild degenerative changes lumbar spine and right hip. Electronically Signed   By: Genia Del M.D.   On: 11/17/2017 07:49   US Scrotum W/doppler  Result Date: 11/17/2017 CLINICAL DATA:  Right-sided testicular pain since 2 a.m. Hematuria and fever. History of TURP in 2016.  EXAM: SCROTAL ULTRASOUND DOPPLER ULTRASOUND OF THE TESTICLES TECHNIQUE: Complete ultrasound examination of the testicles, epididymis, and other scrotal structures was performed. Color and spectral Doppler ultrasound were also utilized to evaluate blood flow to the testicles. COMPARISON:  None. FINDINGS: Right testicle Measurements: 4 x 2.5 x 2.9 cm. No mass or microlithiasis visualized. Left testicle Measurements: 4.4 x 2.1 x 2.6 cm. No mass or microlithiasis visualized. Right epididymis: Normal in size and appearance. Simple 4 mm epididymal head cyst is noted. Left epididymis: Normal in size and appearance. Simple 4 and 5 mm epididymal head cysts. Hydrocele:  Small bilateral hydroceles. Varicocele:  None visualized. Pulsed Doppler interrogation of both testes demonstrates normal low resistance arterial and venous waveforms bilaterally. IMPRESSION: 1. No intratesticular mass or evidence of torsion. No significant hypervascularity of either testicle to suggest orchitis or of the epididymi to suggest epididymitis. 2. Small simple epididymal head cysts measuring between 4 and 5 mm. 3.  Small bilateral hydroceles. Electronically Signed   By: Ashley Royalty M.D.   On: 11/17/2017 19:03   Scheduled Meds: . allopurinol  300 mg Oral Daily  . aspirin  325 mg Oral Daily  . FLUoxetine  20 mg Oral Daily  . heparin  5,000 Units Subcutaneous Q8H  . loratadine  10 mg Oral Daily  . metoprolol succinate  100 mg Oral Daily  . multivitamin with minerals  1 tablet Oral Daily  . omega-3 acid ethyl esters  1 g Oral Daily  . polyvinyl alcohol  2 drop Both Eyes BID  . rosuvastatin  20 mg Oral Daily   Continuous Infusions: . sodium chloride 100 mL/hr at 11/18/17 0529  . cefTRIAXone (ROCEPHIN)  IV Stopped (11/17/17 2308)    LOS: 1 day   Kerney Elbe, DO [facility] Pager [phone]  If 7PM-7AM, please contact night-coverage [email or web address] Password [facility] 11/18/2017, 8:05 AM

## 2017-11-18 NOTE — Progress Notes (Signed)
CRITICAL VALUE ALERT  Critical Value:  Lactic Acid 2.3  Date & Time Notied:  11/17/17  23:47  Provider Notified: [place]  Orders Received/Actions taken: Order for IV fluid bolus

## 2017-11-18 NOTE — Consult Note (Signed)
Reason for Consult: Recurrent Urinary Retention, Pyelonephritis, Elevated PSA, Gross Hematuria  Referring Physician: Baird Kay MD  Charles [name] is an 57 y.o. male.   HPI:   1 - Recurrent Urinary Retention - retention 2016 prompting greenlight laser TURP 2016. CT 10/2017 with sig protate hypertrophy, Gland 157m. Cr <1. Cysto 10/2017 with recurrent massive kissing lobe hypertrophy. Foley placed over wire 11/18/17 with 5069mefflux urine.   2 - Pyelonephritis - fevers to 101, elevated lactate, UCX e. Coli / pending at ER presentation 11/17/17 c/w pyelonephritis. CT w/o hydro. Placed on empiric Rocephin.   3 - Elevated PSA - PSA 4.2 2019 but PSA Density normal at 0.027ng/mL.   4 - Gross Hematuria - new gross hematuria at admission for pyelo and urinary retention. Non-con CT w/o stones or large GU mass. Cysto with massive prostate.   PMH sig for CAD/Stent, OSA. He works in [employer]  Today "Charles Santeeis seen in consultation for above / catheter placement in setting of urinary retention.   Past Medical History:  Diagnosis Date  . BPH (benign prostatic hypertrophy) with urinary retention   . CAD (coronary artery disease) 04/2008   successful PCI of the lesion in the proximal LAD using xience drug-eluting stent with improvement in central narrowing from 95% to 0%.  Dr. BrOlevia Perches. Foley catheter in place   . Gout    per pt stable is of 08-18-2014  . History of acute bronchitis    recently 07-16-2014  . History of sepsis    urosepsis--  08-13-2014 admission  . Hyperlipidemia LDL goal <100   . Hypertension   . Multilevel degenerative disc disease    cervical and lumbar  . OSA (obstructive sleep apnea)       . Psoriasis   . S/P drug eluting coronary stent placement    05-13-2008   DES X1 TO pLAD  . Sigmoid diverticulosis     Past Surgical History:  Procedure Laterality Date  . BUNIONECTOMY Right 2000  . COLONOSCOPY WITH PROPOFOL  last  one 10-06-2013  . CORONARY ANGIOPLASTY WITH STENT PLACEMENT  05-13-2008    Dr BrEustace Quail PCI w/  DES x1 to  pLAD/  ef 65%  . GREEN LIGHT LASER TURP (TRANSURETHRAL RESECTION OF PROSTATE N/A 08/23/2014   Procedure: GREEN LIGHT LASER TURP (TRANSURETHRAL RESECTION OF PROSTATE;  Surgeon: JoIrine SealMD;  [facility] Service: Urology;  Laterality: N/A;  . WRIST GANGLION EXCISION Left 2001    Family History  Problem Relation Age of Onset  . Hypertension Father   . Stroke Father [id number]. Lung cancer Mother         smoker  . Diabetes Sister   . Heart disease Brother [id number]      stent LAD 2012  . Colon cancer Neg Hx   . Pancreatic cancer Neg Hx   . Rectal cancer Neg Hx   . Stomach cancer Neg Hx     Social History:  reports that he quit smoking about 20 years ago. His smoking use included cigarettes. He has a 10.50 pack-year smoking history. He quit smokeless tobacco use about 20 years ago. His smokeless tobacco use included snuff. He reports that he drinks about 1.2 oz of alcohol per week. He reports that he does not use drugs.  Allergies:  Allergies  Allergen Reactions  . Lidocaine Anaphylaxis    REACTION: anaphylactic shock (also he was  on Neomycin sulfate)  . Neomycin Sulfate Anaphylaxis    REACTION: anaphylactic shock (also on Lidocaine  topically)  . Sulfa Antibiotics Anaphylaxis  . Sulfa Drugs Cross Reactors Anaphylaxis  . Flexeril [Cyclobenzaprine] Other (See Comments)    05/26/13 urinary retention  . Hctz [Hydrochlorothiazide] Other (See Comments)    Gout   . Tramadol Other (See Comments)    05/26/13 urinary retention  . Atrovent [Ipratropium] Other (See Comments)    Caused Urinary retention (this is the nasal spray)  . Latex Itching and Rash    Skin irritation    Medications: I have reviewed the patient's current medications.  Results for orders placed or performed during the hospital encounter of 11/17/17 (from the past 48 hour(s))  CBC with  Differential     Status: Abnormal   Collection Time: 11/17/17  6:30 AM  Result Value Ref Range   WBC 15.2 (H) 4.0 - 10.5 K/uL   RBC 4.93 4.22 - 5.81 MIL/uL   Hemoglobin 15.4 13.0 - 17.0 g/dL   HCT 44.4 39.0 - 52.0 %   MCV 90.1 78.0 - 100.0 fL   MCH 31.2 26.0 - 34.0 pg   MCHC 34.7 30.0 - 36.0 g/dL   RDW 13.3 11.5 - 15.5 %   Platelets 189 150 - 400 K/uL   Neutrophils Relative % 84 %   Neutro Abs 12.9 (H) 1.7 - 7.7 K/uL   Lymphocytes Relative 8 %   Lymphs Abs 1.2 0.7 - 4.0 K/uL   Monocytes Relative 7 %   Monocytes Absolute 1.1 (H) 0.1 - 1.0 K/uL   Eosinophils Relative 1 %   Eosinophils Absolute 0.1 0.0 - 0.7 K/uL   Basophils Relative 0 %   Basophils Absolute 0.0 0.0 - 0.1 K/uL    Comment: Performed at [facility], [address] [place], [place] [address]  Basic metabolic panel     Status: Abnormal   Collection Time: 11/17/17  6:30 AM  Result Value Ref Range   Sodium 139 135 - 145 mmol/L   Potassium 4.7 3.5 - 5.1 mmol/L   Chloride 103 101 - 111 mmol/L   CO2 26 22 - 32 mmol/L   Glucose, Bld 140 (H) 65 - 99 mg/dL   BUN 16 6 - 20 mg/dL   Creatinine, Ser 1.19 0.61 - 1.24 mg/dL   Calcium 8.9 8.9 - 10.3 mg/dL   GFR calc non Af Amer >60 >60 mL/min   GFR calc Af Amer >60 >60 mL/min    Comment: (NOTE) The eGFR has been calculated using the CKD EPI equation. This calculation has not been validated in all clinical situations. eGFR's persistently <60 mL/min signify possible Chronic Kidney Disease.    Anion gap 10 5 - 15    Comment: Performed at [facility], [address] [place], [place] [address]  Urine Culture     Status: Abnormal (Preliminary result)   Collection Time: 11/17/17  8:02 AM  Result Value Ref Range   Specimen Description      URINE, CLEAN CATCH Performed at [facility], [address] [place], [place] [address]    Special Requests      NONE Performed at [facility], [address] [place], [place] [address]     Culture >=100,000 COLONIES/mL ESCHERICHIA COLI (A)    Report Status PENDING   Urinalysis, Routine w reflex microscopic     Status: Abnormal   Collection Time: 11/17/17  8:04  AM  Result Value Ref Range   Color, Urine BROWN (A) YELLOW    Comment: BIOCHEMICALS MAY BE AFFECTED BY COLOR   APPearance CLOUDY (A) CLEAR   Specific Gravity, Urine 1.020 1.005 - 1.030   pH 8.0 5.0 - 8.0   Glucose, UA NEGATIVE NEGATIVE mg/dL   Hgb urine dipstick LARGE (A) NEGATIVE   Bilirubin Urine NEGATIVE NEGATIVE   Ketones, ur 15 (A) NEGATIVE mg/dL   Protein, ur 100 (A) NEGATIVE mg/dL   Nitrite POSITIVE (A) NEGATIVE   Leukocytes, UA LARGE (A) NEGATIVE    Comment: Performed at [facility], [address] [place], [place] [address]  Urinalysis, Microscopic (reflex)     Status: Abnormal   Collection Time: 11/17/17  8:04 AM  Result Value Ref Range   RBC / HPF >50 0 - 5 RBC/hpf   WBC, UA >50 0 - 5 WBC/hpf   Bacteria, UA MANY (A) NONE SEEN   Squamous Epithelial / LPF 0-5 0 - 5   WBC Clumps PRESENT     Comment: Performed at [facility], [address] [place], [place] [address]  I-Stat CG4 Lactic Acid, ED     Status: Abnormal   Collection Time: 11/17/17 12:34 PM  Result Value Ref Range   Lactic Acid, Venous 2.59 (HH) 0.5 - 1.9 mmol/L   Comment NOTIFIED PHYSICIAN   I-Stat CG4 Lactic Acid, ED  (not at  [facility])     Status: Abnormal   Collection Time: 11/17/17  2:43 PM  Result Value Ref Range   Lactic Acid, Venous 2.76 (HH) 0.5 - 1.9 mmol/L   Comment NOTIFIED PHYSICIAN   HIV antibody (Routine Testing)     Status: None   Collection Time: 11/17/17  5:31 PM  Result Value Ref Range   HIV Screen 4th Generation wRfx Non Reactive Non Reactive    Comment: (NOTE) Performed At: [facility] [place], [place] [id number] Rush Farmer MD [id number] Performed at [facility], [place] [address], [place], [place] [address]   Lactic acid, plasma     Status:  Abnormal   Collection Time: 11/17/17  5:31 PM  Result Value Ref Range   Lactic Acid, Venous 2.2 (HH) 0.5 - 1.9 mmol/L    Comment: CRITICAL RESULT CALLED TO, READ BACK BY AND VERIFIED WITH: B.HOLT AT 1831 ON 11/17/17 BY N.THOMPSON Performed at [facility], [place] [address], [place], [place] [address]   Procalcitonin     Status: None   Collection Time: 11/17/17  5:31 PM  Result Value Ref Range   Procalcitonin 0.29 ng/mL    Comment:        Interpretation: PCT (Procalcitonin) <= 0.5 ng/mL: Systemic infection (sepsis) is not likely. Local bacterial infection is possible. (NOTE)       Sepsis PCT Algorithm           Lower Respiratory Tract                                      Infection PCT Algorithm    ----------------------------     ----------------------------         PCT < 0.25 ng/mL                PCT < 0.10 ng/mL         Strongly encourage  Strongly discourage   discontinuation of antibiotics    initiation of antibiotics    ----------------------------     -----------------------------       PCT 0.25 - 0.50 ng/mL            PCT 0.10 - 0.25 ng/mL               OR       >80% decrease in PCT            Discourage initiation of                                            antibiotics      Encourage discontinuation           of antibiotics    ----------------------------     -----------------------------         PCT >= 0.50 ng/mL              PCT 0.26 - 0.50 ng/mL               AND        <80% decrease in PCT             Encourage initiation of                                             antibiotics       Encourage continuation           of antibiotics    ----------------------------     -----------------------------        PCT >= 0.50 ng/mL                  PCT > 0.50 ng/mL               AND         increase in PCT                  Strongly encourage                                      initiation of antibiotics    Strongly encourage escalation            of antibiotics                                     -----------------------------                                           PCT <= 0.25 ng/mL                                                 OR                                        >  80% decrease in PCT                                     Discontinue / Do not initiate                                             antibiotics Performed at [facility] [address], [place], [place] [address]   Protime-INR     Status: None   Collection Time: 11/17/17  5:31 PM  Result Value Ref Range   Prothrombin Time 14.9 11.4 - 15.2 seconds   INR 1.18     Comment: Performed at [facility], [place] [address] [place], [place] [address]  APTT     Status: None   Collection Time: 11/17/17  5:31 PM  Result Value Ref Range   aPTT 32 24 - 36 seconds    Comment: Performed at [facility], [place] [address], [place], [place] [address]  CBC     Status: Abnormal   Collection Time: 11/17/17  5:31 PM  Result Value Ref Range   WBC 15.8 (H) 4.0 - 10.5 K/uL   RBC 4.76 4.22 - 5.81 MIL/uL   Hemoglobin 14.7 13.0 - 17.0 g/dL   HCT 43.2 39.0 - 52.0 %   MCV 90.8 78.0 - 100.0 fL   MCH 30.9 26.0 - 34.0 pg   MCHC 34.0 30.0 - 36.0 g/dL   RDW 13.4 11.5 - 15.5 %   Platelets 201 150 - 400 K/uL    Comment: Performed at [facility], [place] [address], [place], [place] [address]  Creatinine, serum     Status: None   Collection Time: 11/17/17  5:31 PM  Result Value Ref Range   Creatinine, Ser 1.06 0.61 - 1.24 mg/dL   GFR calc non Af Amer >60 >60 mL/min   GFR calc Af Amer >60 >60 mL/min    Comment: (NOTE) The eGFR has been calculated using the CKD EPI equation. This calculation has not been validated in all clinical situations. eGFR's persistently <60 mL/min signify possible Chronic Kidney Disease. Performed at [facility], [place] [name] [place], [place] [address]   TSH      Status: None   Collection Time: 11/17/17  5:31 PM  Result Value Ref Range   TSH 0.644 0.350 - 4.500 uIU/mL    Comment: Performed by a 3rd Generation assay with a functional sensitivity of <=0.01 uIU/mL. Performed at [facility], [place] [address], [place], [place] [address]   Culture, blood (routine x 2)     Status: None (Preliminary result)   Collection Time: 11/17/17  7:06 PM  Result Value Ref Range   Specimen Description      BLOOD LEFT HAND Performed at [facility] [address], [place], [place] [address]    Special Requests      BOTTLES DRAWN AEROBIC AND ANAEROBIC Blood Culture adequate volume Performed at [facility] [address] [place], [place] [address]    Culture      NO GROWTH < 24 HOURS Performed at [facility] [address], [place], [place] [address]    Report Status PENDING   Lactic acid, plasma     Status: Abnormal  Collection Time: 11/17/17  7:07 PM  Result Value Ref Range   Lactic Acid, Venous 3.2 (HH) 0.5 - 1.9 mmol/L    Comment: CRITICAL RESULT CALLED TO, READ BACK BY AND VERIFIED WITH: D.BROWN AT 2028 ON 11/17/17 BY N.THOMPSON Performed at [facility], [place] [address] [place], [place] [address]   Culture, blood (routine x 2)     Status: None (Preliminary result)   Collection Time: 11/17/17  7:07 PM  Result Value Ref Range   Specimen Description      BLOOD RIGHT HAND Performed at [facility] [address], [place], [place] [address]    Special Requests      BOTTLES DRAWN AEROBIC AND ANAEROBIC Blood Culture adequate volume Performed at [facility] [address] [place], [place] [address]    Culture      NO GROWTH < 24 HOURS Performed at [facility] [address] [place], [place] [address]    Report Status PENDING   Lactic acid, plasma     Status: Abnormal   Collection Time: 11/17/17 10:41 PM  Result  Value Ref Range   Lactic Acid, Venous 2.3 (HH) 0.5 - 1.9 mmol/L    Comment: CRITICAL RESULT CALLED TO, READ BACK BY AND VERIFIED WITH: D BROWN,RN _0  11/17/17 MKELLY Performed at [facility], [place] [address] [place], [place] [address]   Comprehensive metabolic panel     Status: Abnormal   Collection Time: 11/18/17  5:12 AM  Result Value Ref Range   Sodium 139 135 - 145 mmol/L   Potassium 3.7 3.5 - 5.1 mmol/L   Chloride 107 98 - 111 mmol/L   CO2 25 22 - 32 mmol/L   Glucose, Bld 112 (H) 70 - 99 mg/dL   BUN 12 6 - 20 mg/dL   Creatinine, Ser 0.92 0.61 - 1.24 mg/dL   Calcium 7.6 (L) 8.9 - 10.3 mg/dL   Total Protein 6.4 (L) 6.5 - 8.1 g/dL   Albumin 3.5 3.5 - 5.0 g/dL   AST 16 15 - 41 U/L   ALT 18 0 - 44 U/L   Alkaline Phosphatase 44 38 - 126 U/L   Total Bilirubin 1.1 0.3 - 1.2 mg/dL   GFR calc non Af Amer >60 >60 mL/min   GFR calc Af Amer >60 >60 mL/min    Comment: (NOTE) The eGFR has been calculated using the CKD EPI equation. This calculation has not been validated in all clinical situations. eGFR's persistently <60 mL/min signify possible Chronic Kidney Disease.    Anion gap 7 5 - 15    Comment: Performed at [facility], [place] [address], [place], [place] [address]  CBC     Status: Abnormal   Collection Time: 11/18/17  5:12 AM  Result Value Ref Range   WBC 15.4 (H) 4.0 - 10.5 K/uL   RBC 4.46 4.22 - 5.81 MIL/uL   Hemoglobin 13.6 13.0 - 17.0 g/dL   HCT 41.5 39.0 - 52.0 %   MCV 93.0 78.0 - 100.0 fL   MCH 30.5 26.0 - 34.0 pg   MCHC 32.8 30.0 - 36.0 g/dL   RDW 13.6 11.5 - 15.5 %   Platelets 177 150 - 400 K/uL    Comment: Performed at [facility], [place] [address], [place], [place] [address]  Glucose, capillary     Status: Abnormal   Collection Time: 11/18/17  7:38 AM  Result Value Ref Range   Glucose-Capillary  110 (H) 70 - 99 mg/dL    Ct Renal Stone Study  Result Date: 11/17/2017 CLINICAL DATA:  57 year old male  with hematuria upon voiding this morning. Became lightheaded. Lower abdominal pain. Right testicular pain. Post TURP. Initial encounter. EXAM: CT ABDOMEN AND PELVIS WITHOUT CONTRAST TECHNIQUE: Multidetector CT imaging of the abdomen and pelvis was performed following the standard protocol without IV contrast. COMPARISON:  08/13/2014 CT. FINDINGS: Lower chest: Scarring lung bases greater on left. Heart size within normal limits. Right coronary artery calcifications. Hepatobiliary: Enlarged fatty liver spanning over 20.2 cm. Taking into account limitation by non contrast imaging, no worrisome hepatic lesion. No calcified gallstones. Pancreas: Tiny calcifications pancreatic level probably vascular in origin rather than related to chronic pancreatitis. Taking into account limitation by non contrast imaging, no worrisome pancreatic mass or inflammation. Spleen: Taking into account limitation by non contrast imaging, no splenic mass or enlargement. Adrenals/Urinary Tract: No adrenal mass. No obstructing stone or hydronephrosis. Taking into account limitation by non contrast imaging, no worrisome renal mass. Enlarged prostate gland impresses upon the bladder base. Stomach/Bowel: Sigmoid diverticulosis. No extraluminal bowel inflammatory process. Long segment of colon under distended and evaluation limited. Tiny radiopaque material within noninflamed appearing appendix may represent ingested material or tiny stone. Vascular/Lymphatic: Calcified plaque aorta and iliac arteries. No abdominal aortic aneurysm. Mild ectasia common iliac arteries. Top-normal size lymph node adjacent to right seminal vesicle. Scattered normal size lymph nodes throughout the abdomen, pelvis and retroperitoneal region. Reproductive: Enlarged prostate gland with impression upon the bladder base. Enlarged prostate gland with impression upon the bladder base. Haziness of fat planes surrounding seminal vesicles bilaterally, slightly more notable on the  right. Other: No free air or bowel containing hernia. Musculoskeletal: Facet degenerative changes greatest L3-4 level. Mild disc degeneration L1-2 through L5-S1. Mild right hip joint degenerative changes. IMPRESSION: No obstructing renal/ureteral calculi or hydronephrosis. Enlarged prostate gland with impression upon the bladder base. Clinical and laboratory correlation recommended. Haziness of fat planes surrounding seminal vesicles bilaterally, slightly more notable on the right. Slight increase in size of adjacent right-sided pelvic lymph node. Question seminal vesicle inflammation? Sigmoid diverticulosis. Enlarged fatty liver. Right coronary artery calcification. Aortic Atherosclerosis (ICD10-I70.0). Mild degenerative changes lumbar spine and right hip. Electronically Signed   By: Genia Del M.D.   On: 11/17/2017 07:49   US Scrotum W/doppler  Result Date: 11/17/2017 CLINICAL DATA:  Right-sided testicular pain since 2 a.m. Hematuria and fever. History of TURP in 2016. EXAM: SCROTAL ULTRASOUND DOPPLER ULTRASOUND OF THE TESTICLES TECHNIQUE: Complete ultrasound examination of the testicles, epididymis, and other scrotal structures was performed. Color and spectral Doppler ultrasound were also utilized to evaluate blood flow to the testicles. COMPARISON:  None. FINDINGS: Right testicle Measurements: 4 x 2.5 x 2.9 cm. No mass or microlithiasis visualized. Left testicle Measurements: 4.4 x 2.1 x 2.6 cm. No mass or microlithiasis visualized. Right epididymis: Normal in size and appearance. Simple 4 mm epididymal head cyst is noted. Left epididymis: Normal in size and appearance. Simple 4 and 5 mm epididymal head cysts. Hydrocele:  Small bilateral hydroceles. Varicocele:  None visualized. Pulsed Doppler interrogation of both testes demonstrates normal low resistance arterial and venous waveforms bilaterally. IMPRESSION: 1. No intratesticular mass or evidence of torsion. No significant hypervascularity of either  testicle to suggest orchitis or of the epididymi to suggest epididymitis. 2. Small simple epididymal head cysts measuring between 4 and 5 mm. 3. Small bilateral hydroceles. Electronically Signed   By: Ashley Royalty M.D.   On: 11/17/2017  19:03    Review of Systems  Constitutional: Positive for chills, diaphoresis, fever and malaise/fatigue.  HENT: Negative.   Eyes: Negative.   Respiratory: Negative.   Cardiovascular: Negative.   Gastrointestinal: Negative.   Genitourinary: Positive for frequency, hematuria and urgency.  Musculoskeletal: Negative.   Skin: Negative.   Neurological: Negative.   Endo/Heme/Allergies: Negative.   Psychiatric/Behavioral: Negative.    Blood pressure (!) 153/77, pulse 83, temperature 99.4 F (37.4 C), temperature source Oral, resp. rate 18, height _0  (1.727 m), weight 114.5 kg (252 lb 6.8 oz), SpO2 94 %. Physical Exam  Constitutional: He appears well-developed.  HENT:  Head: Normocephalic.  Eyes: Pupils are equal, round, and reactive to light.  Neck: Normal range of motion.  Cardiovascular: Normal rate.  Respiratory: Effort normal.  GI: Soft.  Mild truncal obesity.   Genitourinary:  Genitourinary Comments: Some SP TTP. Bladder scan >532m.   Musculoskeletal: Normal range of motion.  Neurological: He is alert.  Skin: Skin is warm.  Psychiatric: He has a normal mood and affect.    BEDSIDE CYSTOSCOPY / COMPLICATED CATHETER PLACEMENT: Using aseptic technique cystourethroscopy perforemd with viscious lidocaine analgesia and 30F flexible scope. Massive bilobar prostatic hypertrophy with very anterior true urine channel . Moderate bladder trabecullation. 0.038 sensor wire placed into blader under cysto vision over which 58F council catheter placed with immediate efflux 5041mmoderately cloudy urine.   Assessment/Plan:  1 - Recurrent Urinary Retention - likely due to BPH exacerbated by cystitis. Foley placed today, rec keep at discharge, start on finasteride  and then office trial of void.  May eventually need simple prostatectomy at his gland size.   I will arrange his GU follow up.   2 - Pyelonephritis - agree with current ABX pending additional CX data.   3 - Elevated PSA - PSA Density normal, suggests BPH as etiology. NO indication for biopsy.   4 - Gross Hematuria - likely from BPH in setting of infection, consider further eval if becomtes recurrent.    I feel pt will by OK for DC from GU perspective once afebrile x 24 hours and prelim CX data allows PO med transition. Please call me directly with questions anytime.   Jaziah Goeller 11/18/2017, 6:23 PM

## 2017-11-18 NOTE — Progress Notes (Signed)
Attempted 45F and 33F coude cath. Unable to get cath placed successfully with either cath. Pt tolerating cath attempts poorly. MD paged.

## 2017-11-18 NOTE — Progress Notes (Signed)
CRITICAL VALUE ALERT  Critical Value:  Lactic Acid 3.2  Date & Time Notied:  11/17/17 20:27  Provider Notified: Sch[place]  Orders Received/Actions taken: Order for IV fluid bolus

## 2017-11-19 DIAGNOSIS — N451 Epididymitis: Secondary | ICD-10-CM

## 2017-11-19 DIAGNOSIS — R7989 Other specified abnormal findings of blood chemistry: Secondary | ICD-10-CM

## 2017-11-19 DIAGNOSIS — R310 Gross hematuria: Secondary | ICD-10-CM

## 2017-11-19 LAB — URINE CULTURE

## 2017-11-19 LAB — GLUCOSE, CAPILLARY: GLUCOSE-CAPILLARY: 98 mg/dL (ref 70–99)

## 2017-11-19 LAB — LACTIC ACID, PLASMA: Lactic Acid, Venous: 0.9 mmol/L (ref 0.5–1.9)

## 2017-11-19 MED ORDER — ERYTHROMYCIN 5 MG/GM OP OINT: TOPICAL_OINTMENT | Freq: Four times a day (QID) | OPHTHALMIC | Status: DC

## 2017-11-19 MED ORDER — ERYTHROMYCIN 5 MG/GM OP OINT
TOPICAL_OINTMENT | Freq: Three times a day (TID) | OPHTHALMIC | Status: DC
  Administered 2017-11-19 – 2017-11-20 (×2): 1 via OPHTHALMIC
  Administered 2017-11-20: 14:00:00 via OPHTHALMIC
  Filled 2017-11-19: qty 1

## 2017-11-19 NOTE — Progress Notes (Addendum)
PROGRESS NOTE  Charles [name] WJX:[id number] DOB: 03/25/1961 DOA: 11/17/2017 PCP: Pearline Cables, MD  HPI/Recap of past 24 hours: Charles [name] is a 57 year old male with past medical history significant for BPH coronary artery disease status post PCI with stenting, gout, hypertension, hyperlipidemia, degenerative disc disease, who presented to ED [facility] as a transfer from med [facility] with complaints of gross hematuria.  On presentation UA positive, even with T-max of 103, leukocytosis.  Patient admitted for acute pyelonephritis.  Urology consulted.  Hospital stay complicated by urinary retention.  Indwelling Foley catheter has been placed.  11/19/2017: Patient seen and examined .  He has no new complaints.  Has the indwelling Foley catheter in place and his urine is clearing out.  Assessment/Plan: Active Problems:   Hyperlipidemia   Essential hypertension   CORONARY ATHEROSCLEROSIS, NATIVE VESSEL   G E R D   Psoriasis   H/O urinary retention   Gout   Sepsis secondary to UTI [facility])   UTI (urinary tract infection)   Pre-syncope  Sepsis secondary to acute pyelonephritis Today 11/19/17 fever with T-max 101.3, leukocytosis with WBC 15 K Urine culture positive >100,000 colonies of E. coli pansensitive Blood cultures from 11/17/2017 negative x2 Continue IV antibiotics Urology following No sign of obstructing renal/ureteral calculi or hydronephrosis on CT renal Monitor fever curve Monitor WBC Repeat CBC in the morning  Gross hematuria Resolving Continue to monitor Repeat CBC in the morning  Urinary retention suspect secondary to BPH Status post Foley catheter placement Will follow with urology outpatient Monitor urine output  Suspected bilateral conjunctivitis Start erythromycin ointment 3 times daily x5 days Hold artificial tears for now  Hypertension Blood pressures well controlled Continue metoprolol  Chronic depression/anxiety Continue  Prozac  Hyperlipidemia Continue Crestor  Gout Not in acute flare Continue allopurinol       Code Status: Full code  Family Communication: None At bedside  Disposition Plan: Home when clinically stable   Consultants:  Urology  Procedures:  Indwelling Foley catheter placement  Antimicrobials:  IV ceftriaxone  DVT prophylaxis: SCDs   Objective: Vitals:   11/19/17 0436 11/19/17 0703 11/19/17 1341 11/19/17 1641  BP: 122/73  135/78   Pulse: 70  73   Resp: 18  19   Temp: 99.3 F (37.4 C)  99.4 F (37.4 C) (!) 101.1 F (38.4 C)  TempSrc: Oral  Oral Oral  SpO2: 99%  96%   Weight:  113.3 kg (249 lb 12.8 oz)    Height:        Intake/Output Summary (Last 24 hours) at 11/19/2017 1813 Last data filed at 11/19/2017 1644 Gross per 24 hour  Intake 2920 ml  Output 4526 ml  Net -1606 ml   Filed Weights   11/17/17 0631 11/18/17 0542 11/19/17 0703  Weight: 110.2 kg (243 lb) 114.5 kg (252 lb 6.8 oz) 113.3 kg (249 lb 12.8 oz)    Exam:  . General: 57 y.o. year-old male well developed well nourished in no acute distress.  Alert and oriented x3.  Yellowish discharge noted from both eyes. . Cardiovascular: Regular rate and rhythm with no rubs or gallops.  No thyromegaly or JVD noted.   Marland Kitchen Respiratory: Clear to auscultation with no wheezes or rales. Good inspiratory effort. . Abdomen: Soft nontender nondistended with normal bowel sounds x4 quadrants. . Musculoskeletal: No lower extremity edema. 2/4 pulses in all 4 extremities. . Skin: No ulcerative lesions noted or rashes, . Psychiatry: Mood is appropriate for condition and setting  Data Reviewed: CBC: Recent Labs  Lab 11/17/17 0630 11/17/17 1731 11/18/17 0512  WBC 15.2* 15.8* 15.4*  NEUTROABS 12.9*  --   --   HGB 15.4 14.7 13.6  HCT 44.4 43.2 41.5  MCV 90.1 90.8 93.0  PLT 189 201 177   Basic Metabolic Panel: Recent Labs  Lab 11/17/17 0630 11/17/17 1731 11/18/17 0512  NA 139  --  139  K 4.7  --  3.7   CL 103  --  107  CO2 26  --  25  GLUCOSE 140*  --  112*  BUN 16  --  12  CREATININE 1.19 1.06 0.92  CALCIUM 8.9  --  7.6*   GFR: Estimated Creatinine Clearance: 107 mL/min (by C-G formula based on SCr of 0.92 mg/dL). Liver Function Tests: Recent Labs  Lab 11/18/17 0512  AST 16  ALT 18  ALKPHOS 44  BILITOT 1.1  PROT 6.4*  ALBUMIN 3.5   No results for input(s): LIPASE, AMYLASE in the last 168 hours. No results for input(s): AMMONIA in the last 168 hours. Coagulation Profile: Recent Labs  Lab 11/17/17 1731  INR 1.18   Cardiac Enzymes: No results for input(s): CKTOTAL, CKMB, CKMBINDEX, TROPONINI in the last 168 hours. BNP (last 3 results) No results for input(s): PROBNP in the last 8760 hours. HbA1C: No results for input(s): HGBA1C in the last 72 hours. CBG: Recent Labs  Lab 11/18/17 0738 11/19/17 0725  GLUCAP 110* 98   Lipid Profile: No results for input(s): CHOL, HDL, LDLCALC, TRIG, CHOLHDL, LDLDIRECT in the last 72 hours. Thyroid Function Tests: Recent Labs    11/17/17 1731  TSH 0.644   Anemia Panel: No results for input(s): VITAMINB12, FOLATE, FERRITIN, TIBC, IRON, RETICCTPCT in the last 72 hours. Urine analysis:    Component Value Date/Time   COLORURINE BROWN (A) 11/17/2017 0804   APPEARANCEUR CLOUDY (A) 11/17/2017 0804   LABSPEC 1.020 11/17/2017 0804   PHURINE 8.0 11/17/2017 0804   GLUCOSEU NEGATIVE 11/17/2017 0804   HGBUR LARGE (A) 11/17/2017 0804   BILIRUBINUR NEGATIVE 11/17/2017 0804   KETONESUR 15 (A) 11/17/2017 0804   PROTEINUR 100 (A) 11/17/2017 0804   UROBILINOGEN 1.0 08/13/2014 2049   NITRITE POSITIVE (A) 11/17/2017 0804   LEUKOCYTESUR LARGE (A) 11/17/2017 0804   Sepsis Labs: @LABRCNTIP (procalcitonin:4,lacticidven:4)  ) Recent Results (from the past 240 hour(s))  Urine Culture     Status: Abnormal   Collection Time: 11/17/17  8:02 AM  Result Value Ref Range Status   Specimen Description   Final    URINE, CLEAN CATCH Performed at  [facility], [address] [place], [place] [address]    Special Requests   Final    NONE Performed at [facility], [address] [place], [place] [address]    Culture >=100,000 COLONIES/mL ESCHERICHIA COLI (A)  Final   Report Status 11/19/2017 FINAL  Final   Organism ID, Bacteria ESCHERICHIA COLI (A)  Final      Susceptibility   Escherichia coli - MIC*    AMPICILLIN 8 SENSITIVE Sensitive     CEFAZOLIN <=4 SENSITIVE Sensitive     CEFTRIAXONE <=1 SENSITIVE Sensitive     CIPROFLOXACIN <=0.25 SENSITIVE Sensitive     GENTAMICIN <=1 SENSITIVE Sensitive     IMIPENEM <=0.25 SENSITIVE Sensitive     NITROFURANTOIN <=16 SENSITIVE Sensitive     TRIMETH/SULFA <=20 SENSITIVE Sensitive     AMPICILLIN/SULBACTAM <=2 SENSITIVE Sensitive     PIP/TAZO <=4 SENSITIVE Sensitive  Extended ESBL NEGATIVE Sensitive     * >=100,000 COLONIES/mL ESCHERICHIA COLI  Culture, blood (routine x 2)     Status: None (Preliminary result)   Collection Time: 11/17/17  7:06 PM  Result Value Ref Range Status   Specimen Description   Final    BLOOD LEFT HAND Performed at [facility], [address] [place], [place] [address]    Special Requests   Final    BOTTLES DRAWN AEROBIC AND ANAEROBIC Blood Culture adequate volume Performed at [facility], [address] [place], [place] [address]    Culture   Final    NO GROWTH 2 DAYS Performed at [facility], [address] [place], [place] [address]    Report Status PENDING  Incomplete  Culture, blood (routine x 2)     Status: None (Preliminary result)   Collection Time: 11/17/17  7:07 PM  Result Value Ref Range Status   Specimen Description   Final    BLOOD RIGHT HAND Performed at [facility], [address] [place], [place] [address]    Special Requests   Final    BOTTLES DRAWN AEROBIC AND ANAEROBIC Blood Culture adequate volume Performed at [facility], [address] [place], [place] [address]    Culture   Final    NO GROWTH 2 DAYS Performed at [facility], [address] [place], [place] [address]    Report Status PENDING  Incomplete      Studies: No results found.  Scheduled Meds: . allopurinol  300 mg Oral Daily  . aspirin  325 mg Oral Daily  . erythromycin   Both Eyes Q8H  . finasteride  5 mg Oral Daily  . FLUoxetine  20 mg Oral Daily  . heparin  5,000 Units Subcutaneous Q8H  . loratadine  10 mg Oral Daily  . metoprolol succinate  100 mg Oral Daily  . multivitamin with minerals  1 tablet Oral Daily  . omega-3 acid ethyl esters  1 g Oral Daily  . rosuvastatin  20 mg Oral Daily  . tamsulosin  0.4 mg Oral QPC supper    Continuous Infusions: . sodium chloride 100 mL/hr at 11/19/17 1653  . cefTRIAXone (ROCEPHIN)  IV Stopped (11/18/17 2232)     LOS: 2 days     Darlin Droparole N Tashaun Obey, MD [facility] Pager [phone]  If 7PM-7AM, please contact night-coverage [email or web address] Password [email or web address] 11/19/2017, 6:13 PM

## 2017-11-19 NOTE — Progress Notes (Signed)
This RN spoke with Corrie DandyMary in pharmacy regarding erythromycin ointment and patients allergy to neomycin. Corrie DandyMary stated that they are two different classes and were not related, and that it would be safe to administer erythromycin to patient.

## 2017-11-20 LAB — COMPREHENSIVE METABOLIC PANEL
ALBUMIN: 3 g/dL — AB (ref 3.5–5.0)
ALT: 18 U/L (ref 0–44)
ANION GAP: 8 (ref 5–15)
AST: 17 U/L (ref 15–41)
Alkaline Phosphatase: 45 U/L (ref 38–126)
BUN: 10 mg/dL (ref 6–20)
CHLORIDE: 107 mmol/L (ref 98–111)
CO2: 24 mmol/L (ref 22–32)
Calcium: 8.3 mg/dL — ABNORMAL LOW (ref 8.9–10.3)
Creatinine, Ser: 0.82 mg/dL (ref 0.61–1.24)
GFR calc Af Amer: >60 mL/min (ref >60)
GFR calc non Af Amer: >60 mL/min (ref >60)
Glucose, Bld: 119 mg/dL — ABNORMAL HIGH (ref 70–99)
POTASSIUM: 3.7 mmol/L (ref 3.5–5.1)
Sodium: 139 mmol/L (ref 135–145)
Total Bilirubin: 0.3 mg/dL (ref 0.3–1.2)
Total Protein: 6.3 g/dL — ABNORMAL LOW (ref 6.5–8.1)

## 2017-11-20 LAB — CBC
HCT: 39.2 % (ref 39.0–52.0)
Hemoglobin: 13.3 g/dL (ref 13.0–17.0)
MCH: 31.2 pg (ref 26.0–34.0)
MCHC: 33.9 g/dL (ref 30.0–36.0)
MCV: 92 fL (ref 78.0–100.0)
PLATELETS: 193 10*3/uL (ref 150–400)
RBC: 4.26 MIL/uL (ref 4.22–5.81)
RDW: 13.2 % (ref 11.5–15.5)
WBC: 8.4 10*3/uL (ref 4.0–10.5)

## 2017-11-20 LAB — GLUCOSE, CAPILLARY: GLUCOSE-CAPILLARY: 97 mg/dL (ref 70–99)

## 2017-11-20 MED ORDER — TAMSULOSIN HCL 0.4 MG PO CAPS: 0.4000 mg | ORAL_CAPSULE | Freq: Every day | ORAL | 0 refills | Status: AC

## 2017-11-20 MED ORDER — CIPROFLOXACIN HCL 500 MG PO TABS
500.0000 mg | ORAL_TABLET | Freq: Two times a day (BID) | ORAL | Status: DC
  Administered 2017-11-20: 500 mg via ORAL
  Filled 2017-11-20: qty 1

## 2017-11-20 MED ORDER — FINASTERIDE 5 MG PO TABS: 5.0000 mg | ORAL_TABLET | Freq: Every day | ORAL | 0 refills | Status: AC

## 2017-11-20 MED ORDER — CIPROFLOXACIN HCL 500 MG PO TABS: 500.0000 mg | ORAL_TABLET | Freq: Two times a day (BID) | ORAL | 0 refills | Status: AC

## 2017-11-20 MED ORDER — ERYTHROMYCIN 5 MG/GM OP OINT: TOPICAL_OINTMENT | Freq: Three times a day (TID) | OPHTHALMIC | 0 refills | Status: AC

## 2017-11-20 NOTE — Discharge Summary (Signed)
Discharge Summary  Charles BoucheRobert [name] EAV:[id number] DOB: 1961/03/21  PCP: Charles Cablesopland, Jessica C, MD  Admit date: 11/17/2017 Discharge date: 11/20/2017  Time spent: 25 minutes  Recommendations for Outpatient Follow-up:  1. Follow-up with urology 2. Follow-up with PCP 3. Take your medications as prescribed  Discharge Diagnoses:  Active Hospital Problems   Diagnosis Date Noted  . Sepsis secondary to UTI (HCC) 11/17/2017  . UTI (urinary tract infection) 11/17/2017  . Pre-syncope 11/17/2017  . Gout 08/14/2014  . H/O urinary retention 05/09/2013  . Psoriasis 05/08/2012  . CORONARY ATHEROSCLEROSIS, NATIVE VESSEL 08/03/2008  . Essential hypertension 11/10/2007  . Hyperlipidemia 11/10/2007  . Charles [name] 01/06/2007    Resolved Hospital Problems  No resolved problems to display.    Discharge Condition: Stable  Diet recommendation: Resume previous diet  Vitals:   11/20/17 0553 11/20/17 1038  BP: (!) 149/88 (!) 144/85  Pulse: 66 76  Resp: 18 18  Temp: 98.4 F (36.9 [name]) 97.7 F (36.5 [name])  SpO2: 97% 97%    History of present illness:  Charles [name] is a 57 year old male with past medical history significant for BPH coronary artery disease status post PCI with stenting, gout, hypertension, hyperlipidemia, degenerative disc disease, who [facility] with complaints of gross hematuria.  On presentation UA positive, even with T-max of 103, leukocytosis.  Patient admitted for acute pyelonephritis.  Urology consulted.  Hospital stay complicated by urinary retention.  Indwelling Foley catheter has been placed.  11/19/2017: Patient seen and examined .  He has no new complaints.  Has the indwelling Foley catheter in place and his urine is clearing out.  11/20/2017: Patient seen and examined at his bedside.  He has no new complaints.  Urine in his Foley bag appears clear.  On the day of discharge, the patient was hemodynamically stable.  He will need  to follow-up with urology and PCP posthospitalization.    Hospital Course:  Active Problems:   Hyperlipidemia   Essential hypertension   CORONARY ATHEROSCLEROSIS, NATIVE VESSEL   Charles [name]   Psoriasis   H/O urinary retention   Gout   Sepsis secondary to UTI [facility](HCC)   UTI (urinary tract infection)   Pre-syncope  Sepsis secondary to acute pyelonephritis, resolving Afebrile with no leukocytosis Urine culture positive >100,000 colonies of E. coli pansensitive Blood cultures from 11/17/2017 negative x2 Completed 3 days of IV ceftriaxone Switch to p.o. ciprofloxacin 500 mg twice daily x7 days No sign of obstructing renal/ureteral calculi or hydronephrosis on CT renal Follow-up with urology outpatient  Gross hematuria, resolved Cystoscopy with massive prostate  Urinary retention suspect secondary to BPH Status post Foley catheter placement Will follow with urology outpatient  Suspected bilateral conjunctivitis Start erythromycin ointment 3 times daily x5 days Hold artificial tears for now  Hypertension Blood pressure is stable Continue metoprolol  Chronic depression/anxiety Continue Prozac  Hyperlipidemia Continue Crestor  Gout Not in acute flare Continue allopurinol    Procedures:  Indwelling Foley catheter placement due to urinary retention  Consultations: Urology  Discharge Exam: BP (!) 144/85 (BP Location: Left Arm)   Pulse 76   Temp 97.7 F (36.5 [name]) (Oral)   Resp 18   Ht 5\' 8"  (1.727 m)   Wt 112.9 kg (248 lb 12.8 oz)   SpO2 97%   BMI 37.83 kg/m  . General: 57 y.o. year-old male well developed well nourished in no acute distress.  Alert and oriented x3. .Marland Kitchen  Cardiovascular: Regular rate and rhythm with no rubs or gallops.  No thyromegaly or JVD noted.   Marland Kitchen Respiratory: Clear to auscultation with no wheezes or rales. Good inspiratory effort. . Abdomen: Soft nontender nondistended with normal bowel sounds x4 quadrants. . Musculoskeletal: No lower  extremity edema. 2/4 pulses in all 4 extremities. . Skin: No ulcerative lesions noted or rashes.  Indwelling Foley catheter in place. Marland Kitchen Psychiatry: Mood is appropriate for condition and setting  Discharge Instructions You were cared for by a hospitalist during your hospital stay. If you have any questions about your discharge medications or the care you received while you were in the hospital after you are discharged, you can call the unit and asked to speak with the hospitalist on call if the hospitalist that took care of you is not available. Once you are discharged, your primary care physician will handle any further medical issues. Please note that NO REFILLS for any discharge medications will be authorized once you are discharged, as it is imperative that you return to your primary care physician (or establish a relationship with a primary care physician if you do not have one) for your aftercare needs so that they can reassess your need for medications and monitor your lab values.   Allergies as of 11/20/2017      Reactions   Lidocaine Anaphylaxis   REACTION: anaphylactic shock (also he was on Neomycin sulfate)   Neomycin Sulfate Anaphylaxis   REACTION: anaphylactic shock (also on Lidocaine  topically)   Sulfa Antibiotics Anaphylaxis   Sulfa Drugs Cross Reactors Anaphylaxis   Flexeril [cyclobenzaprine] Other (See Comments)   05/26/13 urinary retention   Hctz [hydrochlorothiazide] Other (See Comments)   Gout    Tramadol Other (See Comments)   05/26/13 urinary retention   Atrovent [ipratropium] Other (See Comments)   Caused Urinary retention (this is the nasal spray)   Latex Itching, Rash   Skin irritation      Medication List    STOP taking these medications   carboxymethylcellul-glycerin 0.5-0.9 % ophthalmic solution Commonly known as:  REFRESH OPTIVE   clobetasol cream 0.05 % Commonly known as:  TEMOVATE   EXCEDRIN PO   fluocinonide 0.05 % external solution Commonly  known as:  LIDEX   fluocinonide cream 0.05 % Commonly known as:  LIDEX   ibuprofen 200 MG tablet Commonly known as:  ADVIL,MOTRIN     TAKE these medications   allopurinol 300 MG tablet Commonly known as:  ZYLOPRIM TAKE 1 TABLET BY MOUTH EVERY DAY   aspirin 325 MG EC tablet Take 325 mg by mouth daily.   cetirizine 10 MG tablet Commonly known as:  ZYRTEC Take 10 mg by mouth at bedtime.   ciprofloxacin 500 MG tablet Commonly known as:  CIPRO Take 1 tablet (500 mg total) by mouth 2 (two) times daily for 7 days.   CO Q 10 PO Take 1 capsule by mouth every morning. 500mg    erythromycin ophthalmic ointment Place into both eyes every 8 (eight) hours for 4 days.   finasteride 5 MG tablet Commonly known as:  PROSCAR Take 1 tablet (5 mg total) by mouth daily. Start taking on:  11/21/2017   Fish Oil 1000 MG Caps Take 1,000 mg by mouth every morning.   FLUoxetine 20 MG capsule Commonly known as:  PROZAC TAKE 1 CAPSULE DAILY---  takes in am   fluticasone 50 MCG/ACT nasal spray Commonly known as:  FLONASE Place 1 spray into both nostrils daily as needed for allergies  or rhinitis.   metoprolol succinate 100 MG 24 hr tablet Commonly known as:  TOPROL-XL Take 1 tablet (100 mg total) by mouth daily.   multivitamin capsule Take 1 capsule by mouth every morning.   nitroGLYCERIN 0.3 MG SL tablet Commonly known as:  NITROSTAT Place 1 tablet (0.3 mg total) under the tongue every 5 (five) minutes as needed for chest pain.   rosuvastatin 20 MG tablet Commonly known as:  CRESTOR Take 1 tablet (20 mg total) by mouth daily.   tamsulosin 0.4 MG Caps capsule Commonly known as:  FLOMAX Take 1 capsule (0.4 mg total) by mouth daily after supper.      Allergies  Allergen Reactions  . Lidocaine Anaphylaxis    REACTION: anaphylactic shock (also he was on Neomycin sulfate)  . Neomycin Sulfate Anaphylaxis    REACTION: anaphylactic shock (also on Lidocaine  topically)  . Sulfa  Antibiotics Anaphylaxis  . Sulfa Drugs Cross Reactors Anaphylaxis  . Flexeril [Cyclobenzaprine] Other (See Comments)    05/26/13 urinary retention  . Hctz [Hydrochlorothiazide] Other (See Comments)    Gout   . Tramadol Other (See Comments)    05/26/13 urinary retention  . Atrovent [Ipratropium] Other (See Comments)    Caused Urinary retention (this is the nasal spray)  . Latex Itching and Rash    Skin irritation   Follow-up Information    Copland, Gwenlyn Found, MD. Call in 1 day(s).   Specialty:  Family Medicine Why:  Please call for an appointment. Contact information: [name] [place] [address] [phone]        Sebastian Ache, MD. Call in 1 day(s).   Specialty:  Urology Why:  Please call for an appointment. Contact information: [address] [place] [address] [phone]            The results of significant diagnostics from this hospitalization (including imaging, microbiology, ancillary and laboratory) are listed below for reference.    Significant Diagnostic Studies: Ct Renal Stone Study  Result Date: 11/17/2017 CLINICAL DATA:  57 year old male with hematuria upon voiding this morning. Became lightheaded. Lower abdominal pain. Right testicular pain. Post TURP. Initial encounter. EXAM: CT ABDOMEN AND PELVIS WITHOUT CONTRAST TECHNIQUE: Multidetector CT imaging of the abdomen and pelvis was performed following the standard protocol without IV contrast. COMPARISON:  08/13/2014 CT. FINDINGS: Lower chest: Scarring lung bases greater on left. Heart size within normal limits. Right coronary artery calcifications. Hepatobiliary: Enlarged fatty liver spanning over 20.2 cm. Taking into account limitation by non contrast imaging, no worrisome hepatic lesion. No calcified gallstones. Pancreas: Tiny calcifications pancreatic level probably vascular in origin rather than related to chronic pancreatitis. Taking into account limitation by non contrast  imaging, no worrisome pancreatic mass or inflammation. Spleen: Taking into account limitation by non contrast imaging, no splenic mass or enlargement. Adrenals/Urinary Tract: No adrenal mass. No obstructing stone or hydronephrosis. Taking into account limitation by non contrast imaging, no worrisome renal mass. Enlarged prostate gland impresses upon the bladder base. Stomach/Bowel: Sigmoid diverticulosis. No extraluminal bowel inflammatory process. Long segment of colon under distended and evaluation limited. Tiny radiopaque material within noninflamed appearing appendix may represent ingested material or tiny stone. Vascular/Lymphatic: Calcified plaque aorta and iliac arteries. No abdominal aortic aneurysm. Mild ectasia common iliac arteries. Top-normal size lymph node adjacent to right seminal vesicle. Scattered normal size lymph nodes throughout the abdomen, pelvis and retroperitoneal region. Reproductive: Enlarged prostate gland with impression upon the bladder base. Enlarged prostate gland with impression  upon the bladder base. Haziness of fat planes surrounding seminal vesicles bilaterally, slightly more notable on the right. Other: No free air or bowel containing hernia. Musculoskeletal: Facet degenerative changes greatest L3-4 level. Mild disc degeneration L1-2 through L5-S1. Mild right hip joint degenerative changes. IMPRESSION: No obstructing renal/ureteral calculi or hydronephrosis. Enlarged prostate gland with impression upon the bladder base. Clinical and laboratory correlation recommended. Haziness of fat planes surrounding seminal vesicles bilaterally, slightly more notable on the right. Slight increase in size of adjacent right-sided pelvic lymph node. Question seminal vesicle inflammation? Sigmoid diverticulosis. Enlarged fatty liver. Right coronary artery calcification. Aortic Atherosclerosis (ICD10-I70.0). Mild degenerative changes lumbar spine and right hip. Electronically Signed   By: Lacy Duverney M.[name].   On: 11/17/2017 07:49   US Scrotum W/doppler  Result Date: 11/17/2017 CLINICAL DATA:  Right-sided testicular pain since 2 a.m. Hematuria and fever. History of TURP in 2016. EXAM: SCROTAL ULTRASOUND DOPPLER ULTRASOUND OF THE TESTICLES TECHNIQUE: Complete ultrasound examination of the testicles, epididymis, and other scrotal structures was performed. Color and spectral Doppler ultrasound were also utilized to evaluate blood flow to the testicles. COMPARISON:  None. FINDINGS: Right testicle Measurements: 4 x 2.5 x 2.9 cm. No mass or microlithiasis visualized. Left testicle Measurements: 4.4 x 2.1 x 2.6 cm. No mass or microlithiasis visualized. Right epididymis: Normal in size and appearance. Simple 4 mm epididymal head cyst is noted. Left epididymis: Normal in size and appearance. Simple 4 and 5 mm epididymal head cysts. Hydrocele:  Small bilateral hydroceles. Varicocele:  None visualized. Pulsed Doppler interrogation of both testes demonstrates normal low resistance arterial and venous waveforms bilaterally. IMPRESSION: 1. No intratesticular mass or evidence of torsion. No significant hypervascularity of either testicle to suggest orchitis or of the epididymi to suggest epididymitis. 2. Small simple epididymal head cysts measuring between 4 and 5 mm. 3. Small bilateral hydroceles. Electronically Signed   By: Tollie Eth M.[name].   On: 11/17/2017 19:03    Microbiology: Recent Results (from the past 240 hour(s))  Urine Culture     Status: Abnormal   Collection Time: 11/17/17  8:02 AM  Result Value Ref Range Status   Specimen Description   Final    URINE, CLEAN CATCH Performed at [facility], [address] [place], [place] [address]    Special Requests   Final    NONE Performed at [facility], [address] [place], [place] [address]    Culture >=100,000 COLONIES/mL ESCHERICHIA COLI (A)  Final   Report Status 11/19/2017 FINAL  Final   Organism ID, Bacteria  ESCHERICHIA COLI (A)  Final      Susceptibility   Escherichia coli - MIC*    AMPICILLIN 8 SENSITIVE Sensitive     CEFAZOLIN <=4 SENSITIVE Sensitive     CEFTRIAXONE <=1 SENSITIVE Sensitive     CIPROFLOXACIN <=0.25 SENSITIVE Sensitive     GENTAMICIN <=1 SENSITIVE Sensitive     IMIPENEM <=0.25 SENSITIVE Sensitive     NITROFURANTOIN <=16 SENSITIVE Sensitive     TRIMETH/SULFA <=20 SENSITIVE Sensitive     AMPICILLIN/SULBACTAM <=2 SENSITIVE Sensitive     PIP/TAZO <=4 SENSITIVE Sensitive     Extended ESBL NEGATIVE Sensitive     * >=100,000 COLONIES/mL ESCHERICHIA COLI  Culture, blood (routine x 2)     Status: None (Preliminary result)   Collection Time: 11/17/17  7:06 PM  Result Value Ref Range Status   Specimen Description   Final    BLOOD LEFT HAND Performed  at [facility], [address], [place], [place] [address]    Special Requests   Final    BOTTLES DRAWN AEROBIC AND ANAEROBIC Blood Culture adequate volume Performed at [facility], [address], [place], [place] [address]    Culture   Final    NO GROWTH 3 DAYS Performed at [facility], [address] [place], [place] [address]    Report Status PENDING  Incomplete  Culture, blood (routine x 2)     Status: None (Preliminary result)   Collection Time: 11/17/17  7:07 PM  Result Value Ref Range Status   Specimen Description   Final    BLOOD RIGHT HAND Performed at [facility], [address], [place], [place] [address]    Special Requests   Final    BOTTLES DRAWN AEROBIC AND ANAEROBIC Blood Culture adequate volume Performed at [facility], [address] [place], [place] [address]    Culture   Final    NO GROWTH 3 DAYS Performed at [facility], [address] [place], [place] [address]    Report Status PENDING  Incomplete     Labs: Basic Metabolic Panel: Recent Labs  Lab 11/17/17 0630 11/17/17 1731 11/18/17 0512  11/20/17 0440  NA 139  --  139 139  K 4.7  --  3.7 3.7  CL 103  --  107 107  CO2 26  --  25 24  GLUCOSE 140*  --  112* 119*  BUN 16  --  12 10  CREATININE 1.19 1.06 0.92 0.82  CALCIUM 8.9  --  7.6* 8.3*   Liver Function Tests: Recent Labs  Lab 11/18/17 0512 11/20/17 0440  AST 16 17  ALT 18 18  ALKPHOS 44 45  BILITOT 1.1 0.3  PROT 6.4* 6.3*  ALBUMIN 3.5 3.0*   No results for input(s): LIPASE, AMYLASE in the last 168 hours. No results for input(s): AMMONIA in the last 168 hours. CBC: Recent Labs  Lab 11/17/17 0630 11/17/17 1731 11/18/17 0512 11/20/17 0440  WBC 15.2* 15.8* 15.4* 8.4  NEUTROABS 12.9*  --   --   --   HGB 15.4 14.7 13.6 13.3  HCT 44.4 43.2 41.5 39.2  MCV 90.1 90.8 93.0 92.0  PLT 189 201 177 193   Cardiac Enzymes: No results for input(s): CKTOTAL, CKMB, CKMBINDEX, TROPONINI in the last 168 hours. BNP: BNP (last 3 results) No results for input(s): BNP in the last 8760 hours.  ProBNP (last 3 results) No results for input(s): PROBNP in the last 8760 hours.  CBG: Recent Labs  Lab 11/18/17 0738 11/19/17 0725 11/20/17 0724  GLUCAP 110* 98 97       Signed:  Darlin Drop, MD [facility] 11/20/2017, 2:18 PM

## 2017-11-20 NOTE — Plan of Care (Signed)
Discharge instructions reviewed with patient, questions answered, verbalized understanding.  Educationprovided on care of foley catheter at home, switching from leg bag to bedside drainage bag at night.  Patient verbalized understanding of above.  Awaiting wife for ride to discharge.

## 2017-11-21 ENCOUNTER — Telehealth: Payer: Self-pay

## 2017-11-21 NOTE — Telephone Encounter (Signed)
11/21/17  Transition Care Management Follow-up Telephone Call  ADMISSION DATE: 11/17/17  DISCHARGE DATE: 11/20/17  How have you been since you were released from the hospital? Feeling ok not back to his normal yet.  Do you understand why you were in the hospital? Yes   Do you understand the discharge instrcutions? Yes    Items Reviewed:  Medications reviewed: Yes e Allergies reviewed:Yes  Dietary changes reviewed:  Back to regular diet.   Referrals reviewed: Appointment scheduled with Dr. Patsy Lageropland. Patient states he has scheduled appointment with Urology.    Functional Questionnaire:   Activities of Daily Living (ADLs): Patient states he can perform all independently.  Any patient concerns? None at this time per patient.   Confirmed importance and date/time of follow-up visits scheduled: Yes   Confirmed with patient if condition begins to worsen call PCP or go to the ER. Yes    Patient was given the office number and encouragred to call back with questions or concerns. Yes

## 2017-11-22 LAB — CULTURE, BLOOD (ROUTINE X 2)
Culture: NO GROWTH
Culture: NO GROWTH
SPECIAL REQUESTS: ADEQUATE
Special Requests: ADEQUATE

## 2017-11-29 NOTE — Progress Notes (Addendum)
[facility] at [facility] [address] [place], [place] [address] [phone]  Date:  12/03/2017   Name:  Donis Pinder   DOB:  [birth year]   MRN:  [id number]  PCP:  Pearline Cables, MD    Chief Complaint: Hospitalization Follow-up (UTI, blood in urine, no energy, foley cath, iv drop 4 days, 7 days of cipro at home, flomax)   History of Present Illness:  Charles [name] is a 57 y.o. very pleasant male patient who presents with the following:  Following up from recent admission: Admit date: 11/17/2017 Discharge date: 11/20/2017 Recommendations for Outpatient Follow-up:  1. Follow-up with urology 2. Follow-up with PCP 3. Take your medications as prescribed  Discharge Diagnoses:      Active Hospital Problems   Diagnosis Date Noted  . Sepsis secondary to UTI (HCC) 11/17/2017  . UTI (urinary tract infection) 11/17/2017  . Pre-syncope 11/17/2017  . Gout 08/14/2014  . H/O urinary retention 05/09/2013  . Psoriasis 05/08/2012  . CORONARY ATHEROSCLEROSIS, NATIVE VESSEL 08/03/2008  . Essential hypertension 11/10/2007  . Hyperlipidemia 11/10/2007  . Gean Maidens D 01/06/2007    Resolved Hospital Problems  No resolved problems to display.   History of present illness:  Mr. Ly is a 57 year old male with past medical history significant for BPH coronary artery disease status post PCI with stenting, gout, hypertension, hyperlipidemia, degenerative disc disease, who presented to ED [facility] as a transfer from med [facility] with complaints of gross hematuria. On presentation UA positive,even with T-max of 103, leukocytosis. Patient admitted for acute pyelonephritis. Urology consulted. Hospital stay complicated by urinary retention. Indwelling Foley catheter has been placed. 11/19/2017: Patient seen and examined . He has no new complaints. Has the indwelling Foley catheter in place and his urineis clearing out. 11/20/2017:  Patient seen and examined at his bedside.  He has no new complaints.  Urine in his Foley bag appears clear. On the day of discharge, the patient was hemodynamically stable.  He will need to follow-up with urology and PCP posthospitalization.  Hospital Course:  Active Problems:   Hyperlipidemia   Essential hypertension   CORONARY ATHEROSCLEROSIS, NATIVE VESSEL   G E R D   Psoriasis   H/O urinary retention   Gout   Sepsis secondary to UTI [facility])   UTI (urinary tract infection)   Pre-syncope  Sepsis secondary to acute pyelonephritis, resolving Afebrile with no leukocytosis Urine culture positive>100,000 colonies of E. coli pansensitive Blood cultures from 11/17/2017 negative x2 Completed 3 days of IV ceftriaxone Switch to p.o. ciprofloxacin 500 mg twice daily x7 days No sign of obstructing renal/ureteral calculi or hydronephrosis on CT renal Follow-up with urology outpatient  Gross hematuria, resolved Cystoscopy with massive prostate  Urinary retention suspect secondary to BPH Status post Foley catheter placement Will follow with urology outpatient  Suspected bilateral conjunctivitis Start erythromycin ointment 3 times daily x5 days Hold artificial tears for now  Hypertension Blood pressure is stable Continue metoprolol  Chronic depression/anxiety Continue Prozac  Hyperlipidemia Continue Crestor  Gout Not in acute flare Continue allopurinol   Urine culture inpt showed E coli Pt had a TURB in 2016 per Dr. Annabell Howells  Pt notes that his sx just hit him all of a sudden when he got so sick- he felt fine all day, and woke up with rigors. He then noted hematuria and went to the ER.    He had a foley for a week He  went to see urology- Dr. Berneice Heinrich, on 7/2.   He passed his voiding trial and they removed the foley.  He is on flomax and finasteride  He feels like his stream is better since he started on these meds  He is following up with urology in 3 months  He is  back at work this week Temp no higher than 98.8 at home He still feels a bit tired and worn out  No hematuria noted at home since foley out He is eating ok He lost about 10 lbs in the hospital- he was not eating much for a few days  He is still down a few lbs from his baseline per his report  He finished his 7 days of cipro   Wt Readings from Last 3 Encounters:  12/03/17 244 lb (110.7 kg)  11/20/17 248 lb 12.8 oz (112.9 kg)  09/22/17 248 lb (112.5 kg)    Patient Active Problem List   Diagnosis Date Noted  . Sepsis secondary to UTI (HCC) 11/17/2017  . UTI (urinary tract infection) 11/17/2017  . Pre-syncope 11/17/2017  . Routine general medical examination at a health care facility 08/07/2015  . Left calcaneal bursitis 12/14/2014  . 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 Allergic reaction 10/14/2014  . Achilles tendinosis 10/05/2014  . OSA (obstructive sleep apnea) 08/18/2014  . Urinary tract infectious disease 08/14/2014  . Hypotension 08/14/2014  . Hyperlipidemia LDL goal <100 08/14/2014  . Gout 08/14/2014  . Obesity 06/02/2013  . Abnormal MRI, spine 05/09/2013  . H/O urinary retention 05/09/2013  . Psoriasis 05/08/2012  . CORONARY ATHEROSCLEROSIS, NATIVE VESSEL 08/03/2008  . Hyperlipidemia 11/10/2007  . History of gout 11/10/2007  . Essential hypertension 11/10/2007  . G E R D 01/06/2007    Past Medical History:  Diagnosis Date  . BPH (benign prostatic hypertrophy) with urinary retention   . CAD (coronary artery disease) 04/2008   successful PCI of the lesion in the proximal LAD using xience drug-eluting stent with improvement in central narrowing from 95% to 0%.  Dr. Juanda Chance  . Foley catheter in place   . Gout    per pt stable is of 08-18-2014  . History of acute bronchitis    recently 07-16-2014  . History of sepsis    urosepsis--  08-13-2014 admission  . Hyperlipidemia LDL goal <100   . Hypertension   . Multilevel degenerative disc disease     cervical and lumbar  . OSA (obstructive sleep apnea)       . Psoriasis   . S/P drug eluting coronary stent placement    05-13-2008   DES X1 TO pLAD  . Sigmoid diverticulosis     Past Surgical History:  Procedure Laterality Date  . BUNIONECTOMY Right 2000  . COLONOSCOPY WITH PROPOFOL  last one 10-06-2013  . CORONARY ANGIOPLASTY WITH STENT PLACEMENT  05-13-2008    Dr Charlies Constable   PCI w/  DES x1 to  pLAD/  ef 65%  . GREEN LIGHT LASER TURP (TRANSURETHRAL RESECTION OF PROSTATE N/A 08/23/2014   Procedure: GREEN LIGHT LASER TURP (TRANSURETHRAL RESECTION OF PROSTATE;  Surgeon: Bjorn Pippin, MD;  [facility];  Service: Urology;  Laterality: N/A;  . WRIST GANGLION EXCISION Left 2001    Social History   Tobacco Use  . Smoking status: Former Smoker    Packs/day: 0.75    Years: 14.00    Pack years: 10.50    Types: Cigarettes    Last attempt to quit:  05/27/1997    Years since quitting: 20.5  . Smokeless tobacco: Former NeurosurgeonUser    Types: Snuff    Quit date: 08/17/1997  Substance Use Topics  . Alcohol use: Yes    Alcohol/week: 1.2 oz    Types: 2 Cans of beer per week    Comment: occasional  . Drug use: No    Family History  Problem Relation Age of Onset  . Hypertension Father   . Stroke Father [id number]  . Lung cancer Mother         smoker  . Diabetes Sister   . Heart disease Brother 49        stent LAD 2012  . Colon cancer Neg Hx   . Pancreatic cancer Neg Hx   . Rectal cancer Neg Hx   . Stomach cancer Neg Hx     Allergies  Allergen Reactions  . Lidocaine Anaphylaxis    REACTION: anaphylactic shock (also he was on Neomycin sulfate)  . Neomycin Sulfate Anaphylaxis    REACTION: anaphylactic shock (also on Lidocaine  topically)  . Sulfa Antibiotics Anaphylaxis  . Sulfa Drugs Cross Reactors Anaphylaxis  . Flexeril [Cyclobenzaprine] Other (See Comments)    05/26/13 urinary retention  . Hctz [Hydrochlorothiazide] Other (See Comments)    Gout   . Tramadol Other  (See Comments)    05/26/13 urinary retention  . Atrovent [Ipratropium] Other (See Comments)    Caused Urinary retention (this is the nasal spray)  . Latex Itching and Rash    Skin irritation    Medication list has been reviewed and updated.  Current Outpatient Medications on File Prior to Visit  Medication Sig Dispense Refill  . allopurinol (ZYLOPRIM) 300 MG tablet TAKE 1 TABLET BY MOUTH EVERY DAY 90 tablet 3  . aspirin 325 MG EC tablet Take 325 mg by mouth daily.    . cetirizine (ZYRTEC) 10 MG tablet Take 10 mg by mouth at bedtime.    . Coenzyme Q10 (CO Q 10 PO) Take 1 capsule by mouth every morning. 500mg     . finasteride (PROSCAR) 5 MG tablet Take 1 tablet (5 mg total) by mouth daily. 30 tablet 0  . FLUoxetine (PROZAC) 20 MG capsule TAKE 1 CAPSULE DAILY---  takes in am 90 capsule 3  . fluticasone (FLONASE) 50 MCG/ACT nasal spray Place 1 spray into both nostrils daily as needed for allergies or rhinitis.    . metoprolol succinate (TOPROL-XL) 100 MG 24 hr tablet Take 1 tablet (100 mg total) by mouth daily. 90 tablet 3  . Multiple Vitamin (MULTIVITAMIN) capsule Take 1 capsule by mouth every morning.     . nitroGLYCERIN (NITROSTAT) 0.3 MG SL tablet Place 1 tablet (0.3 mg total) under the tongue every 5 (five) minutes as needed for chest pain. 20 tablet 2  . Omega-3 Fatty Acids (FISH OIL) 1000 MG CAPS Take 1,000 mg by mouth every morning.     . rosuvastatin (CRESTOR) 20 MG tablet Take 1 tablet (20 mg total) by mouth daily. 90 tablet 3  . tamsulosin (FLOMAX) 0.4 MG CAPS capsule Take 1 capsule (0.4 mg total) by mouth daily after supper. 30 capsule 0   No current facility-administered medications on file prior to visit.     Review of Systems:  As per HPI- otherwise negative. No current fever or chills   Physical Examination: Vitals:   12/03/17 1537  BP: 128/88  Pulse: 83  Resp: 16  SpO2: 98%   Vitals:   12/03/17 1537  Weight: 244  lb (110.7 kg)  Height: 5\' 8"  (1.727 m)    Body mass index is 37.1 kg/m. Ideal Body Weight: Weight in (lb) to have BMI = 25: 164.1  GEN: WDWN, NAD, Non-toxic, A & O x 3, obese, looks well otherwise  HEENT: Atraumatic, Normocephalic. Neck supple. No masses, No LAD. Ears and Nose: No external deformity. CV: RRR, No M/G/R. No JVD. No thrill. No extra heart sounds. PULM: CTA B, no wheezes, crackles, rhonchi. No retractions. No resp. distress. No accessory muscle use. ABD: S, NT, ND. No rebound. No HSM.  No CVA tenderness, belly is benign  EXTR: No c/c/e NEURO Normal gait.  PSYCH: Normally interactive. Conversant. Not depressed or anxious appearing.  Calm demeanor.    Assessment and Plan: Hospital discharge follow-up  Sepsis due to Escherichia coli [facility]) - Plan: Urine Culture  Following up from septic UTI that appeared about 2 weeks ago He has seen urology already and they added medications to help treat his prostate enlargement Repeat urine culture today to ensure negative Counseled pt to be on watch for any fever or other evidence of symptoms returning and he will do so   Signed Abbe Amsterdam, MD  Received urine culture, negative 7/12- message to pt  Results for orders placed or performed in visit on 12/03/17  Urine Culture  Result Value Ref Range   MICRO NUMBER: [id number]    SPECIMEN QUALITY: ADEQUATE    Sample Source URINE    STATUS: FINAL    Result: No Growth    Message to pt

## 2017-12-03 ENCOUNTER — Ambulatory Visit: Payer: 59 | Admitting: Family Medicine

## 2017-12-03 ENCOUNTER — Encounter: Payer: Self-pay | Admitting: Family Medicine

## 2017-12-03 VITALS — BP 128/88 | HR 83 | Temp 98.5°F | Resp 16 | Ht 68.0 in | Wt 244.0 lb

## 2017-12-03 DIAGNOSIS — A4151 Sepsis due to Escherichia coli [E. coli]: Secondary | ICD-10-CM

## 2017-12-03 DIAGNOSIS — Z09 Encounter for follow-up examination after completed treatment for conditions other than malignant neoplasm: Secondary | ICD-10-CM

## 2017-12-03 NOTE — Patient Instructions (Addendum)
It was good to see you today- I am glad that you are feeling better today Let's re-culture your urine and make sure all is clear.   Assuming your culture is normal and you continue to feel well, we can return to routine care here   Please let me know if you have any fever or other concerns

## 2017-12-04 LAB — URINE CULTURE
MICRO NUMBER: 90817292
Result:: NO GROWTH
SPECIMEN QUALITY: ADEQUATE

## 2017-12-05 ENCOUNTER — Encounter: Payer: Self-pay | Admitting: Family Medicine

## 2018-01-08 ENCOUNTER — Other Ambulatory Visit: Payer: Self-pay | Admitting: Family Medicine

## 2018-03-12 DIAGNOSIS — R310 Gross hematuria: Secondary | ICD-10-CM | POA: Diagnosis not present

## 2018-03-12 DIAGNOSIS — R972 Elevated prostate specific antigen [PSA]: Secondary | ICD-10-CM | POA: Diagnosis not present

## 2018-03-12 DIAGNOSIS — R339 Retention of urine, unspecified: Secondary | ICD-10-CM | POA: Diagnosis not present

## 2018-03-13 NOTE — Telephone Encounter (Signed)
Error

## 2018-03-22 ENCOUNTER — Encounter: Payer: Self-pay | Admitting: Family Medicine

## 2018-03-22 DIAGNOSIS — E782 Mixed hyperlipidemia: Secondary | ICD-10-CM

## 2018-03-22 DIAGNOSIS — I10 Essential (primary) hypertension: Secondary | ICD-10-CM

## 2018-03-23 MED ORDER — FLUOXETINE HCL 20 MG PO CAPS: ORAL_CAPSULE | ORAL | 3 refills | Status: DC

## 2018-03-23 MED ORDER — ALLOPURINOL 300 MG PO TABS: ORAL_TABLET | ORAL | 3 refills | Status: DC

## 2018-03-23 MED ORDER — ROSUVASTATIN CALCIUM 20 MG PO TABS: 20.0000 mg | ORAL_TABLET | Freq: Every day | ORAL | 3 refills | Status: DC

## 2018-03-23 MED ORDER — METOPROLOL SUCCINATE ER 100 MG PO TB24: 100.0000 mg | ORAL_TABLET | Freq: Every day | ORAL | 3 refills | Status: DC

## 2018-03-30 ENCOUNTER — Encounter: Payer: Self-pay | Admitting: Family Medicine

## 2018-03-30 DIAGNOSIS — L409 Psoriasis, unspecified: Secondary | ICD-10-CM

## 2018-03-30 MED ORDER — FLUOCINONIDE 0.05 % EX SOLN: 1.0000 "application " | Freq: Two times a day (BID) | CUTANEOUS | 2 refills | Status: DC

## 2018-03-30 MED ORDER — CLOBETASOL PROPIONATE 0.05 % EX CREA: 1.0000 "application " | TOPICAL_CREAM | Freq: Two times a day (BID) | CUTANEOUS | 3 refills | Status: DC

## 2018-03-30 MED ORDER — FLUOCINONIDE 0.05 % EX CREA: 1.0000 "application " | TOPICAL_CREAM | Freq: Two times a day (BID) | CUTANEOUS | 2 refills | Status: DC

## 2018-04-02 ENCOUNTER — Telehealth: Payer: Self-pay | Admitting: Family Medicine

## 2018-04-02 NOTE — Telephone Encounter (Signed)
Copied from CRM [id number]. Topic: General - Other >> Apr 02, 2018 10:24 AM Jaquita Rector A wrote: Reason for CRM: Misty Stanley at [facility] called to find out if they should send only one bottle of clobetasol cream (TEMOVATE) 0.05 %, fluocinonide (LIDEX) 0.05 % external solution and fluocinonide cream (LIDEX) 0.05 % with no refill or one bottle with 1 refill please advise. Ph# [phone] ref# [id number] speak to anyone that answer

## 2018-04-03 NOTE — Telephone Encounter (Signed)
Please advise, I will call [facility].

## 2018-04-06 ENCOUNTER — Encounter: Payer: Self-pay | Admitting: Family Medicine

## 2018-04-06 NOTE — Telephone Encounter (Signed)
Completed form

## 2018-04-06 NOTE — Telephone Encounter (Signed)
Fax received from [facility] asking for number of packages Dr. Patsy Lager wants to send for pt's fluocinonide solution. Fax left at [place], [place] desk to review with Dr. Patsy Lager.

## 2018-05-13 ENCOUNTER — Encounter: Payer: Self-pay | Admitting: Medical

## 2018-05-13 ENCOUNTER — Ambulatory Visit: Payer: BLUE CROSS/BLUE SHIELD | Admitting: Medical

## 2018-05-13 ENCOUNTER — Ambulatory Visit (HOSPITAL_BASED_OUTPATIENT_CLINIC_OR_DEPARTMENT_OTHER)
Admission: RE | Admit: 2018-05-13 | Discharge: 2018-05-13 | Disposition: A | Discharge status: Home / Self Care | Payer: BLUE CROSS/BLUE SHIELD | Source: Ambulatory Visit | Attending: Medical | Admitting: Medical

## 2018-05-13 VITALS — BP 155/93 | HR 78 | Temp 98.8°F | Resp 16 | Ht 68.0 in | Wt 251.0 lb

## 2018-05-13 DIAGNOSIS — J3489 Other specified disorders of nose and nasal sinuses: Secondary | ICD-10-CM | POA: Diagnosis not present

## 2018-05-13 DIAGNOSIS — J40 Bronchitis, not specified as acute or chronic: Secondary | ICD-10-CM

## 2018-05-13 DIAGNOSIS — R05 Cough: Secondary | ICD-10-CM

## 2018-05-13 DIAGNOSIS — R509 Fever, unspecified: Secondary | ICD-10-CM

## 2018-05-13 DIAGNOSIS — R059 Cough, unspecified: Secondary | ICD-10-CM

## 2018-05-13 DIAGNOSIS — J029 Acute pharyngitis, unspecified: Secondary | ICD-10-CM

## 2018-05-13 LAB — POCT RAPID STREP A (OFFICE): Rapid Strep A Screen: NEGATIVE

## 2018-05-13 MED ORDER — AMOXICILLIN-POT CLAVULANATE 875-125 MG PO TABS: 1.0000 | ORAL_TABLET | Freq: Two times a day (BID) | ORAL | 0 refills | Status: DC

## 2018-05-13 MED ORDER — FLUTICASONE PROPIONATE 50 MCG/ACT NA SUSP: 2.0000 | Freq: Every day | NASAL | 1 refills | Status: DC

## 2018-05-13 MED ORDER — BENZONATATE 100 MG PO CAPS: 100.0000 mg | ORAL_CAPSULE | Freq: Three times a day (TID) | ORAL | 0 refills | Status: DC | PRN

## 2018-05-13 MED FILL — AMOX-CLAV 875-125 MG TABLET: 875-125 | 10 days supply | Qty: 20 | Fill #0

## 2018-05-13 MED FILL — FLUTICASONE PROP 50 MCG SPR: 50 | 30 days supply | Qty: 16 | Fill #0

## 2018-05-13 MED FILL — BENZONATATE 100 MG CAP: 100 | 10 days supply | Qty: 30 | Fill #0

## 2018-05-13 NOTE — Progress Notes (Signed)
Subjective:    Patient ID: Charles [name], male    DOB: [birth year], 59 y.o.   MRN: [id number]  HPI  Pt in with some st, nasal congestion and chest congestion. He states throat hurts a lot to swallow. Symptoms started on Friday. Sunday symptoms have worsened. He has productive cough, some sinus pressure and intermittent fever. T max was 102. After cold sweats temp will come down.  No family sick. Pt has been on the road. He has been on road. Working   Review of Systems  Constitutional: Positive for chills, fatigue and fever.  HENT: Positive for congestion, sinus pressure, sinus pain and sore throat.   Respiratory: Positive for cough. Negative for chest tightness, shortness of breath and wheezing.   Cardiovascular: Negative for chest pain and palpitations.  Gastrointestinal: Negative for abdominal pain.  Musculoskeletal: Negative for back pain.  Neurological: Negative for dizziness and headaches.  Hematological: Positive for adenopathy. Does not bruise/bleed easily.  Psychiatric/Behavioral: Negative for behavioral problems and confusion.   Past Medical History:  Diagnosis Date  . BPH (benign prostatic hypertrophy) with urinary retention   . CAD (coronary artery disease) 04/2008   successful PCI of the lesion in the proximal LAD using xience drug-eluting stent with improvement in central narrowing from 95% to 0%.  Dr. Juanda Chance  . Foley catheter in place   . Gout    per pt stable is of 08-18-2014  . History of acute bronchitis    recently 07-16-2014  . History of sepsis    urosepsis--  08-13-2014 admission  . Hyperlipidemia LDL goal <100   . Hypertension   . Multilevel degenerative disc disease    cervical and lumbar  . OSA (obstructive sleep apnea)       . Psoriasis   . S/P drug eluting coronary stent placement    05-13-2008   DES X1 TO pLAD  . Sigmoid diverticulosis      Social History   Socioeconomic History  . Marital status: Married    Spouse name: [name]  .  Number of children: 1  . Years of education: Tax adviser  . Highest education level: Not on file  Occupational History  . Occupation: Paediatric nurse  . Financial resource strain: Not on file  . Food insecurity:    Worry: Not on file    Inability: Not on file  . Transportation needs:    Medical: Not on file    Non-medical: Not on file  Tobacco Use  . Smoking status: Former Smoker    Packs/day: 0.75    Years: 14.00    Pack years: 10.50    Types: Cigarettes    Last attempt to quit: 05/27/1997    Years since quitting: 20.9  . Smokeless tobacco: Former Neurosurgeon    Types: Snuff    Quit date: 08/17/1997  Substance and Sexual Activity  . Alcohol use: Yes    Alcohol/week: 2.0 standard drinks    Types: 2 Cans of beer per week    Comment: occasional  . Drug use: No  . Sexual activity: Not on file  Lifestyle  . Physical activity:    Days per week: Not on file    Minutes per session: Not on file  . Stress: Not on file  Relationships  . Social connections:    Talks on phone: Not on file    Gets together: Not on file    Attends religious service: Not on file    Active member of club  or organization: Not on file    Attends meetings of clubs or organizations: Not on file    Relationship status: Not on file  . Intimate partner violence:    Fear of current or ex partner: Not on file    Emotionally abused: Not on file    Physically abused: Not on file    Forced sexual activity: Not on file  Other Topics Concern  . Not on file  Social History Narrative   [name] has 1 child   Patient is right handed   Education level is Bachelor's degree   Caffeine consumption is 1 daily    Past Surgical History:  Procedure Laterality Date  . BUNIONECTOMY Right 2000  . COLONOSCOPY WITH PROPOFOL  last one 10-06-2013  . CORONARY ANGIOPLASTY WITH STENT PLACEMENT  05-13-2008    [name]   PCI w/  DES x1 to  pLAD/  ef 65%  . GREEN LIGHT LASER TURP  (TRANSURETHRAL RESECTION OF PROSTATE N/A 08/23/2014   Procedure: GREEN LIGHT LASER TURP (TRANSURETHRAL RESECTION OF PROSTATE;  Surgeon: Bjorn PippinJohn Wrenn, MD;  [facility];  Service: Urology;  Laterality: N/A;  . WRIST GANGLION EXCISION Left 2001    Family History  Problem Relation Age of Onset  . Hypertension Father   . Stroke Father [id number]  . Lung cancer Mother         smoker  . Diabetes Sister   . Heart disease Brother 49        stent LAD 2012  . Colon cancer Neg Hx   . Pancreatic cancer Neg Hx   . Rectal cancer Neg Hx   . Stomach cancer Neg Hx     Allergies  Allergen Reactions  . Lidocaine Anaphylaxis    REACTION: anaphylactic shock (also he was on Neomycin sulfate)  . Neomycin Sulfate Anaphylaxis    REACTION: anaphylactic shock (also on Lidocaine  topically)  . Sulfa Antibiotics Anaphylaxis  . Sulfa Drugs Cross Reactors Anaphylaxis  . Flexeril [Cyclobenzaprine] Other (See Comments)    05/26/13 urinary retention  . Hctz [Hydrochlorothiazide] Other (See Comments)    Gout   . Tramadol Other (See Comments)    05/26/13 urinary retention  . Atrovent [Ipratropium] Other (See Comments)    Caused Urinary retention (this is the nasal spray)  . Latex Itching and Rash    Skin irritation    Current Outpatient Medications on File Prior to Visit  Medication Sig Dispense Refill  . allopurinol (ZYLOPRIM) 300 MG tablet TAKE 1 TABLET BY MOUTH EVERY DAY 90 tablet 3  . aspirin 325 MG EC tablet Take 325 mg by mouth daily.    . cetirizine (ZYRTEC) 10 MG tablet Take 10 mg by mouth at bedtime.    . clobetasol cream (TEMOVATE) 0.05 % Apply 1 application topically 2 (two) times daily. Apply 1 application topically 2 (two) times daily as needed 60 g 3  . Coenzyme Q10 (CO Q 10 PO) Take 1 capsule by mouth every morning. 500mg     . finasteride (PROSCAR) 5 MG tablet Take 1 tablet (5 mg total) by mouth daily. 30 tablet 0  . fluocinonide (LIDEX) 0.05 % external solution Apply 1  application topically 2 (two) times daily. As needed for psoriasis 60 mL 2  . fluocinonide cream (LIDEX) 0.05 % Apply 1 application topically 2 (two) times daily. As needed for psoriasis 60 g 2  . FLUoxetine (PROZAC) 20 MG capsule TAKE 1 CAPSULE DAILY---  takes in  am 90 capsule 3  . fluticasone (FLONASE) 50 MCG/ACT nasal spray Place 1 spray into both nostrils daily as needed for allergies or rhinitis.    . metoprolol succinate (TOPROL-XL) 100 MG 24 hr tablet Take 1 tablet (100 mg total) by mouth daily. 90 tablet 3  . Multiple Vitamin (MULTIVITAMIN) capsule Take 1 capsule by mouth every morning.     . nitroGLYCERIN (NITROSTAT) 0.3 MG SL tablet Place 1 tablet (0.3 mg total) under the tongue every 5 (five) minutes as needed for chest pain. 20 tablet 2  . Omega-3 Fatty Acids (FISH OIL) 1000 MG CAPS Take 1,000 mg by mouth every morning.     . rosuvastatin (CRESTOR) 20 MG tablet Take 1 tablet (20 mg total) by mouth daily. 90 tablet 3  . tamsulosin (FLOMAX) 0.4 MG CAPS capsule Take 1 capsule (0.4 mg total) by mouth daily after supper. 30 capsule 0   No current facility-administered medications on file prior to visit.     BP (!) 155/93 (BP Location: Right Arm, Patient Position: Sitting, Cuff Size: Large)   Pulse 78   Temp 98.8 F (37.1 C) (Oral)   Resp 16   Ht 5\' 8"  (1.727 m)   Wt 251 lb (113.9 kg)   SpO2 97%   BMI 38.16 kg/m      Objective:   Physical Exam  General  Mental Status - Alert. General Appearance - Well groomed. Not in acute distress.  Skin Rashes- No Rashes.  HEENT Head- Normal. Ear Auditory Canal - Left- Normal. Right - Normal.Tympanic Membrane- Left- Normal. Right- Normal. Eye Sclera/Conjunctiva- Left- Normal. Right- Normal. Nose & Sinuses Nasal Mucosa- Left-  Boggy and Congested. Right-  Boggy and  Congested.Bilateral mild  maxillary but no frontal sinus pressure. Mouth & Throat Lips: Upper Lip- Normal: no dryness, cracking, pallor, cyanosis, or vesicular eruption.  Lower Lip-Normal: no dryness, cracking, pallor, cyanosis or vesicular eruption. Buccal Mucosa- Bilateral- No Aphthous ulcers. Oropharynx- No Discharge or Erythema. Tonsils: Characteristics- Bilateral-mild  Erythema or Congestion. Size/Enlargement- Bilateral- 1+ enlargement. Discharge- bilateral-None.  Neck Neck- Supple. No Masses.   Chest and Lung Exam Auscultation: Breath Sounds:-Clear even and unlabored.  Cardiovascular Auscultation:Rythm- Regular, rate and rhythm. Murmurs & Other Heart Sounds:Ausculatation of the heart reveal- No Murmurs.  Lymphatic Head & Neck General Head & Neck Lymphatics: Bilateral: Description- No Localized lymphadenopathy.       Assessment & Plan:  Your strep test was negative. However, your physical exam and clinical presentation is suspicious for strep and it is important to note that rapid strep test can be falsely negative. So I am going to give you augmentin antibiotic today based on your exam and clinical presentation.  You have sinus pressure that cause concern for sinus infection. Rx flonase for nasal congestion. Augmentin has good sinus infection coverage.  Bronchitis type symptoms and will get cxr to make sure no pneumonia. If pneumonia seen will rx azithromycin in addition to augmentin.  Rest hydrate, tylenol for fever, and warm salt water gargles.   Follow up in 7 days or as needed.  Esperanza Richters, PA-C

## 2018-05-13 NOTE — Patient Instructions (Signed)
Your strep test was negative. However, your physical exam and clinical presentation is suspicious for strep and it is important to note that rapid strep test can be falsely negative. So I am going to give you augmentin antibiotic today based on your exam and clinical presentation.  You have sinus pressure that cause concern for sinus infection. Rx flonase for nasal congestion. Augmentin has good sinus infection coverage.  Bronchitis type symptoms and will get cxr to make sure no pneumonia. If pneumonia seen will rx azithromycin in addition to augmentin.  Rest hydrate, tylenol for fever, and warm salt water gargles.   Follow up in 7 days or as needed.

## 2018-05-26 ENCOUNTER — Encounter: Payer: Self-pay | Admitting: Internal Medicine

## 2018-05-26 ENCOUNTER — Ambulatory Visit: Payer: BLUE CROSS/BLUE SHIELD | Admitting: Internal Medicine

## 2018-05-26 VITALS — BP 136/78 | HR 69 | Temp 97.4°F | Resp 16 | Ht 68.0 in | Wt 252.5 lb

## 2018-05-26 DIAGNOSIS — J069 Acute upper respiratory infection, unspecified: Secondary | ICD-10-CM

## 2018-05-26 DIAGNOSIS — J029 Acute pharyngitis, unspecified: Secondary | ICD-10-CM | POA: Diagnosis not present

## 2018-05-26 MED ORDER — AZELASTINE HCL 0.1 % NA SOLN: 2.0000 | Freq: Every evening | NASAL | 3 refills | Status: DC | PRN

## 2018-05-26 MED ORDER — AZITHROMYCIN 250 MG PO TABS: ORAL_TABLET | ORAL | 0 refills | Status: DC

## 2018-05-26 MED FILL — AZELASTINE HCL 137 MCG/SPRA: 137 | 30 days supply | Qty: 30 | Fill #0

## 2018-05-26 MED FILL — AZITHROMYCIN 250 MG TABLET: 250 | 5 days supply | Qty: 6 | Fill #0

## 2018-05-26 NOTE — Progress Notes (Signed)
Pre visit review using our clinic review tool, if applicable. No additional management support is needed unless otherwise documented below in the visit note. 

## 2018-05-26 NOTE — Patient Instructions (Addendum)
  For cough Take OTC Mucinex DM as needed He also can continue using Tessalon Perles  For nasal congestion: Continue Flonase daily Add Astelin 2 sprays every night.  Prescription sent  Give yourself for 5 days. If you are not gradually improving, take an antibiotic called Zithromax.

## 2018-05-26 NOTE — Progress Notes (Signed)
Subjective:    Patient ID: Charles BoucheRobert Simonet, male    DOB: May 12, 1961, 57 y.o.   MRN: [id number]  DOS:  05/26/2018 Type of visit - description: Acute visit  Was seen 05/13/2018 with sore throat, strep test was negative, chest x-ray negative, had Augmentin x10 days. He gradually felt better but after the antibiotics ran out he feels like symptoms are coming back. Currently with mild sore throat, left ear discomfort, sinus congestion, minimal cough with some chest congestion. He could not work yesterday.   Review of Systems Temperature a couple of days ago was 99 but otherwise no fever that he knows of.  No chills No nausea or vomiting Had diarrhea 2 days ago.  No other GI symptoms No myalgias  Past Medical History:  Diagnosis Date  . BPH (benign prostatic hypertrophy) with urinary retention   . CAD (coronary artery disease) 04/2008   successful PCI of the lesion in the proximal LAD using xience drug-eluting stent with improvement in central narrowing from 95% to 0%.  Dr. Juanda ChanceBrodie  . Foley catheter in place   . Gout    per pt stable is of 08-18-2014  . History of acute bronchitis    recently 07-16-2014  . History of sepsis    urosepsis--  08-13-2014 admission  . Hyperlipidemia LDL goal <100   . Hypertension   . Multilevel degenerative disc disease    cervical and lumbar  . OSA (obstructive sleep apnea)       . Psoriasis   . S/P drug eluting coronary stent placement    05-13-2008   DES X1 TO pLAD  . Sigmoid diverticulosis     Past Surgical History:  Procedure Laterality Date  . BUNIONECTOMY Right 2000  . COLONOSCOPY WITH PROPOFOL  last one 10-06-2013  . CORONARY ANGIOPLASTY WITH STENT PLACEMENT  05-13-2008    [name]   PCI w/  DES x1 to  pLAD/  ef 65%  . GREEN LIGHT LASER TURP (TRANSURETHRAL RESECTION OF PROSTATE N/A 08/23/2014   Procedure: GREEN LIGHT LASER TURP (TRANSURETHRAL RESECTION OF PROSTATE;  Surgeon: Bjorn PippinJohn Wrenn, MD;  [facility] [place];   Service: Urology;  Laterality: N/A;  . WRIST GANGLION EXCISION Left 2001    Social History   Socioeconomic History  . Marital status: Married    [name]  . Number of children: 1  . Years of education: Tax adviserBachelor  . Highest education level: Not on file  Occupational History  . Occupation: Paediatric nurseield support Engineer  Social Needs  . Financial resource strain: Not on file  . Food insecurity:    Worry: Not on file    Inability: Not on file  . Transportation needs:    Medical: Not on file    Non-medical: Not on file  Tobacco Use  . Smoking status: Former Smoker    Packs/day: 0.75    Years: 14.00    Pack years: 10.50    Types: Cigarettes    Last attempt to quit: 05/27/1997    Years since quitting: 21.0  . Smokeless tobacco: Former NeurosurgeonUser    Types: Snuff    Quit date: 08/17/1997  Substance and Sexual Activity  . Alcohol use: Yes    Alcohol/week: 2.0 standard drinks    Types: 2 Cans of beer per week    Comment: occasional  . Drug use: No  . Sexual activity: Not on file  Lifestyle  . Physical activity:    Days per week: Not on file  Minutes per session: Not on file  . Stress: Not on file  Relationships  . Social connections:    Talks on phone: Not on file    Gets together: Not on file    Attends religious service: Not on file    Active member of club or organization: Not on file    Attends meetings of clubs or organizations: Not on file    Relationship status: Not on file  . Intimate partner violence:    Fear of current or ex partner: Not on file    Emotionally abused: Not on file    Physically abused: Not on file    Forced sexual activity: Not on file  Other Topics Concern  . Not on file  Social History Narrative   [name] has 1 child   Patient is right handed   Education level is Bachelor's degree   Caffeine consumption is 1 daily      Allergies as of 05/26/2018      Reactions   Lidocaine Anaphylaxis   REACTION: anaphylactic  shock (also he was on Neomycin sulfate)   Neomycin Sulfate Anaphylaxis   REACTION: anaphylactic shock (also on Lidocaine  topically)   Sulfa Antibiotics Anaphylaxis   Sulfa Drugs Cross Reactors Anaphylaxis   Flexeril [cyclobenzaprine] Other (See Comments)   05/26/13 urinary retention   Hctz [hydrochlorothiazide] Other (See Comments)   Gout    Tramadol Other (See Comments)   05/26/13 urinary retention   Atrovent [ipratropium] Other (See Comments)   Caused Urinary retention (this is the nasal spray)   Latex Itching, Rash   Skin irritation      Medication List       Accurate as of May 26, 2018 11:59 PM. Always use your most recent med list.        allopurinol 300 MG tablet Commonly known as:  ZYLOPRIM TAKE 1 TABLET BY MOUTH EVERY DAY   aspirin 325 MG EC tablet Take 325 mg by mouth daily.   azelastine 0.1 % nasal spray Commonly known as:  ASTELIN Place 2 sprays into both nostrils at bedtime as needed for rhinitis. Use in each nostril as directed   azithromycin 250 MG tablet Commonly known as:  ZITHROMAX Z-PAK 2 tabs a day the first day, then 1 tab a day x 4 days   benzonatate 100 MG capsule Commonly known as:  TESSALON Take 1 capsule (100 mg total) by mouth 3 (three) times daily as needed for cough.   cetirizine 10 MG tablet Commonly known as:  ZYRTEC Take 10 mg by mouth at bedtime.   clobetasol cream 0.05 % Commonly known as:  TEMOVATE Apply 1 application topically 2 (two) times daily. Apply 1 application topically 2 (two) times daily as needed   CO Q 10 PO Take 1 capsule by mouth every morning. 500mg    finasteride 5 MG tablet Commonly known as:  PROSCAR Take 1 tablet (5 mg total) by mouth daily.   Fish Oil 1000 MG Caps Take 1,000 mg by mouth every morning.   fluocinonide 0.05 % external solution Commonly known as:  LIDEX Apply 1 application topically 2 (two) times daily. As needed for psoriasis   fluocinonide cream 0.05 % Commonly known as:   LIDEX Apply 1 application topically 2 (two) times daily. As needed for psoriasis   FLUoxetine 20 MG capsule Commonly known as:  PROZAC TAKE 1 CAPSULE DAILY---  takes in am   fluticasone 50 MCG/ACT nasal spray Commonly known as:  FLONASE Place 1 spray into both nostrils daily as needed for allergies or rhinitis.   fluticasone 50 MCG/ACT nasal spray Commonly known as:  FLONASE Place 2 sprays into both nostrils daily.   metoprolol succinate 100 MG 24 hr tablet Commonly known as:  TOPROL-XL Take 1 tablet (100 mg total) by mouth daily.   multivitamin capsule Take 1 capsule by mouth every morning.   nitroGLYCERIN 0.3 MG SL tablet Commonly known as:  NITROSTAT Place 1 tablet (0.3 mg total) under the tongue every 5 (five) minutes as needed for chest pain.   rosuvastatin 20 MG tablet Commonly known as:  CRESTOR Take 1 tablet (20 mg total) by mouth daily.   tamsulosin 0.4 MG Caps capsule Commonly known as:  FLOMAX Take 1 capsule (0.4 mg total) by mouth daily after supper.           Objective:   Physical Exam BP 136/78 (BP Location: Left Arm, Patient Position: Sitting, Cuff Size: Normal)   Pulse 69   Temp (!) 97.4 F (36.3 C) (Oral)   Resp 16   Ht 5\' 8"  (1.727 m)   Wt 252 lb 8 oz (114.5 kg)   SpO2 96%   BMI 38.39 kg/m  General:   Well developed, NAD, BMI noted. HEENT:  Normocephalic . Face symmetric, atraumatic. Mild nasal congestion. TMs, on the right slightly bulge with minimal redness, on the left normal. Lungs:  CTA B Normal respiratory effort, no intercostal retractions, no accessory muscle use. Heart: RRR,  no murmur.  No pretibial edema bilaterally  Skin: Not pale. Not jaundice Neurologic:  alert & oriented X3.  Speech normal, gait appropriate for age and unassisted Psych--  Cognition and judgment appear intact.  Cooperative with normal attention span and concentration.  Behavior appropriate. No anxious or depressed appearing.      Assessment     57 year old gentleman,PMH includes HTN, high cholesterol, CAD, presents with Pharyngitis, URI: Status post Augmentin, symptoms are returning to some extent, exam is benign except for some fluid on the right ear (the left ear is the one that has pressure and discomfort). I recommend to continue with conservative treatment, the patient is quite adamant about needing more abx. Plan: supportive treatment w/ flonase, astelin, mucinex , astelin. Only take zithromax IF not better in few days , rx printed

## 2018-06-23 ENCOUNTER — Encounter: Payer: Self-pay | Admitting: Family Medicine

## 2018-06-23 ENCOUNTER — Ambulatory Visit: Payer: BLUE CROSS/BLUE SHIELD | Admitting: Family Medicine

## 2018-06-23 VITALS — BP 132/88 | HR 74 | Temp 98.0°F

## 2018-06-23 DIAGNOSIS — H6502 Acute serous otitis media, left ear: Secondary | ICD-10-CM | POA: Diagnosis not present

## 2018-06-23 DIAGNOSIS — H60393 Other infective otitis externa, bilateral: Secondary | ICD-10-CM | POA: Diagnosis not present

## 2018-06-23 MED ORDER — OFLOXACIN 0.3 % OT SOLN: 10.0000 [drp] | Freq: Every day | OTIC | 0 refills | Status: DC

## 2018-06-23 MED ORDER — CEFDINIR 300 MG PO CAPS: 300.0000 mg | ORAL_CAPSULE | Freq: Two times a day (BID) | ORAL | 0 refills | Status: DC

## 2018-06-23 MED FILL — CEFDINIR 300 MG CAPSULE: 300 | 10 days supply | Qty: 20 | Fill #0

## 2018-06-23 MED FILL — OFLOXACIN 0.3% EAR DROPS: 0.3 | 10 days supply | Qty: 10 | Fill #0

## 2018-06-23 NOTE — Patient Instructions (Signed)

## 2018-06-23 NOTE — Progress Notes (Signed)
Patient ID: Charles [name], male    DOB: 13-Jun-1960  Age: 58 y.o. MRN: [id number]    Subjective:  Subjective  HPI Charles [name] presents for b/l ear pain.  L >R x several days.  He was just treated for sinus infection with 2 different antibiotics.  Symptoms resolved and then over the weekend he started to have a sore throat and ear pain  Review of Systems  Constitutional: Negative for appetite change, diaphoresis, fatigue and unexpected weight change.  HENT: Positive for congestion and ear pain. Negative for ear discharge, facial swelling, hearing loss, mouth sores, nosebleeds, postnasal drip, rhinorrhea, sinus pressure and sinus pain.   Eyes: Negative for pain, redness and visual disturbance.  Respiratory: Positive for cough. Negative for chest tightness, shortness of breath and wheezing.   Cardiovascular: Negative for chest pain, palpitations and leg swelling.  Endocrine: Negative for cold intolerance, heat intolerance, polydipsia, polyphagia and polyuria.  Genitourinary: Negative for difficulty urinating, dysuria and frequency.  Neurological: Negative for dizziness, light-headedness, numbness and headaches.    History Past Medical History:  Diagnosis Date  . BPH (benign prostatic hypertrophy) with urinary retention   . CAD (coronary artery disease) 04/2008   successful PCI of the lesion in the proximal LAD using xience drug-eluting stent with improvement in central narrowing from 95% to 0%.  Dr. Juanda Chance  . Foley catheter in place   . Gout    per pt stable is of 08-18-2014  . History of acute bronchitis    recently 07-16-2014  . History of sepsis    urosepsis--  08-13-2014 admission  . Hyperlipidemia LDL goal <100   . Hypertension   . Multilevel degenerative disc disease    cervical and lumbar  . OSA (obstructive sleep apnea)       . Psoriasis   . S/P drug eluting coronary stent placement    05-13-2008   DES X1 TO pLAD  . Sigmoid diverticulosis     He has a past  surgical history that includes Bunionectomy (Right, 2000); Colonoscopy with propofol (last one 10-06-2013); Wrist ganglion excision (Left, 2001); Coronary angioplasty with stent (05-13-2008    Dr Charlies Constable); and [place] light laser turp (transurethral resection of prostate (N/A, 08/23/2014).   His family history includes Diabetes in his sister; Heart disease (age of onset: 68) in his brother; Hypertension in his father; Lung cancer in his mother; Stroke (age of onset: 4) in his father.He reports that he quit smoking about 21 years ago. His smoking use included cigarettes. He has a 10.50 pack-year smoking history. He quit smokeless tobacco use about 20 years ago.  His smokeless tobacco use included snuff. He reports current alcohol use of about 2.0 standard drinks of alcohol per week. He reports that he does not use drugs.  Current Outpatient Medications on File Prior to Visit  Medication Sig Dispense Refill  . allopurinol (ZYLOPRIM) 300 MG tablet TAKE 1 TABLET BY MOUTH EVERY DAY 90 tablet 3  . aspirin 325 MG EC tablet Take 325 mg by mouth daily.    Marland Kitchen azelastine (ASTELIN) 0.1 % nasal spray Place 2 sprays into both nostrils at bedtime as needed for rhinitis. Use in each nostril as directed 30 mL 3  . azithromycin (ZITHROMAX Z-PAK) 250 MG tablet 2 tabs a day the first day, then 1 tab a day x 4 days 6 tablet 0  . benzonatate (TESSALON) 100 MG capsule Take 1 capsule (100 mg total) by mouth 3 (three) times daily as needed for cough. 30  capsule 0  . cetirizine (ZYRTEC) 10 MG tablet Take 10 mg by mouth at bedtime.    . clobetasol cream (TEMOVATE) 0.05 % Apply 1 application topically 2 (two) times daily. Apply 1 application topically 2 (two) times daily as needed 60 g 3  . Coenzyme Q10 (CO Q 10 PO) Take 1 capsule by mouth every morning. 500mg     . finasteride (PROSCAR) 5 MG tablet Take 1 tablet (5 mg total) by mouth daily. 30 tablet 0  . fluocinonide (LIDEX) 0.05 % external solution Apply 1 application  topically 2 (two) times daily. As needed for psoriasis 60 mL 2  . fluocinonide cream (LIDEX) 0.05 % Apply 1 application topically 2 (two) times daily. As needed for psoriasis 60 g 2  . FLUoxetine (PROZAC) 20 MG capsule TAKE 1 CAPSULE DAILY---  takes in am 90 capsule 3  . fluticasone (FLONASE) 50 MCG/ACT nasal spray Place 1 spray into both nostrils daily as needed for allergies or rhinitis.    . fluticasone (FLONASE) 50 MCG/ACT nasal spray Place 2 sprays into both nostrils daily. 16 g 1  . metoprolol succinate (TOPROL-XL) 100 MG 24 hr tablet Take 1 tablet (100 mg total) by mouth daily. 90 tablet 3  . Multiple Vitamin (MULTIVITAMIN) capsule Take 1 capsule by mouth every morning.     . nitroGLYCERIN (NITROSTAT) 0.3 MG SL tablet Place 1 tablet (0.3 mg total) under the tongue every 5 (five) minutes as needed for chest pain. 20 tablet 2  . Omega-3 Fatty Acids (FISH OIL) 1000 MG CAPS Take 1,000 mg by mouth every morning.     . rosuvastatin (CRESTOR) 20 MG tablet Take 1 tablet (20 mg total) by mouth daily. 90 tablet 3  . tamsulosin (FLOMAX) 0.4 MG CAPS capsule Take 1 capsule (0.4 mg total) by mouth daily after supper. 30 capsule 0   No current facility-administered medications on file prior to visit.      Objective:  Objective  Physical Exam Vitals signs and nursing note reviewed.  Constitutional:      Appearance: He is well-developed.  HENT:     Right Ear: Hearing and tympanic membrane normal. No mastoid tenderness.     Left Ear: Tenderness present. No mastoid tenderness. Tympanic membrane is injected and erythematous.     Ears:   Eyes:     General:        Right eye: No discharge.        Left eye: No discharge.     Conjunctiva/sclera: Conjunctivae normal.  Cardiovascular:     Rate and Rhythm: Normal rate and regular rhythm.     Heart sounds: Normal heart sounds. No murmur.  Pulmonary:     Effort: Pulmonary effort is normal. No respiratory distress.     Breath sounds: Normal breath  sounds. No wheezing or rales.  Chest:     Chest wall: No tenderness.  Lymphadenopathy:     Cervical: Cervical adenopathy present.  Neurological:     Mental Status: He is alert and oriented to person, place, and time.    BP 132/88   Pulse 74   Temp 98 F (36.7 C)   SpO2 95%  Wt Readings from Last 3 Encounters:  05/26/18 252 lb 8 oz (114.5 kg)  05/13/18 251 lb (113.9 kg)  12/03/17 244 lb (110.7 kg)     Lab Results  Component Value Date   WBC 8.4 11/20/2017   HGB 13.3 11/20/2017   HCT 39.2 11/20/2017   PLT 193 11/20/2017  GLUCOSE 119 (H) 11/20/2017   CHOL 150 09/22/2017   TRIG 290.0 (H) 09/22/2017   HDL 42.90 09/22/2017   LDLDIRECT 77.0 09/22/2017   LDLCALC 68 05/08/2012   ALT 18 11/20/2017   AST 17 11/20/2017   NA 139 11/20/2017   K 3.7 11/20/2017   CL 107 11/20/2017   CREATININE 0.82 11/20/2017   BUN 10 11/20/2017   CO2 24 11/20/2017   TSH 0.644 11/17/2017   PSA 4.29 (H) 09/22/2017   INR 1.18 11/17/2017   HGBA1C 5.9 09/22/2017    Dg Chest 2 View  Result Date: 05/13/2018 CLINICAL DATA:  Cough, congestion and sore throat for 3 days. EXAM: CHEST - 2 VIEW COMPARISON:  Single-view of the chest 08/13/2014. PA and lateral chest 07/16/2014. FINDINGS: The lungs are clear. Heart size is normal. There is no pneumothorax or pleural fluid. No acute or focal bony abnormality. IMPRESSION: Negative chest. Electronically Signed   By: Drusilla Kannerhomas  Dalessio M.D.   On: 05/13/2018 15:58     Assessment & Plan:  Plan  I am having Charles Boucheobert Montante start on cefdinir and ofloxacin. I am also having him maintain his Fish Oil, multivitamin, aspirin, Coenzyme Q10 (CO Q 10 PO), cetirizine, nitroGLYCERIN, fluticasone, finasteride, tamsulosin, FLUoxetine, allopurinol, rosuvastatin, metoprolol succinate, clobetasol cream, fluocinonide, fluocinonide cream, fluticasone, benzonatate, azithromycin, and azelastine.  Meds ordered this encounter  Medications  . cefdinir (OMNICEF) 300 MG capsule    Sig:  Take 1 capsule (300 mg total) by mouth 2 (two) times daily.    Dispense:  20 capsule    Refill:  0  . ofloxacin (FLOXIN OTIC) 0.3 % OTIC solution    Sig: Place 10 drops into both ears daily.    Dispense:  10 mL    Refill:  0    Problem List Items Addressed This Visit    None    Visit Diagnoses    Non-recurrent acute serous otitis media of left ear    -  Primary   Relevant Medications   cefdinir (OMNICEF) 300 MG capsule   Other infective acute otitis externa of both ears       Relevant Medications   cefdinir (OMNICEF) 300 MG capsule   ofloxacin (FLOXIN OTIC) 0.3 % OTIC solution      Follow-up: Return if symptoms worsen or fail to improve.  Donato SchultzYvonne R Lowne Chase, DO

## 2018-10-26 DIAGNOSIS — M48062 Spinal stenosis, lumbar region with neurogenic claudication: Secondary | ICD-10-CM | POA: Diagnosis not present

## 2018-10-26 DIAGNOSIS — M25551 Pain in right hip: Secondary | ICD-10-CM | POA: Diagnosis not present

## 2018-12-22 ENCOUNTER — Encounter: Payer: Self-pay | Admitting: Family Medicine

## 2018-12-22 DIAGNOSIS — E782 Mixed hyperlipidemia: Secondary | ICD-10-CM

## 2018-12-22 DIAGNOSIS — I10 Essential (primary) hypertension: Secondary | ICD-10-CM

## 2018-12-22 MED ORDER — METOPROLOL SUCCINATE ER 100 MG PO TB24: 100.0000 mg | ORAL_TABLET | Freq: Every day | ORAL | 0 refills | Status: DC

## 2018-12-22 MED ORDER — ROSUVASTATIN CALCIUM 20 MG PO TABS: 20.0000 mg | ORAL_TABLET | Freq: Every day | ORAL | 0 refills | Status: DC

## 2019-03-23 ENCOUNTER — Encounter: Payer: Self-pay | Admitting: Medical

## 2019-03-23 ENCOUNTER — Other Ambulatory Visit: Payer: Self-pay

## 2019-03-23 ENCOUNTER — Ambulatory Visit (INDEPENDENT_AMBULATORY_CARE_PROVIDER_SITE_OTHER): Payer: BC Managed Care – PPO | Admitting: Medical

## 2019-03-23 VITALS — BP 147/86 | Temp 99.0°F | Wt 245.0 lb

## 2019-03-23 DIAGNOSIS — R0981 Nasal congestion: Secondary | ICD-10-CM | POA: Diagnosis not present

## 2019-03-23 DIAGNOSIS — R52 Pain, unspecified: Secondary | ICD-10-CM | POA: Diagnosis not present

## 2019-03-23 DIAGNOSIS — J019 Acute sinusitis, unspecified: Secondary | ICD-10-CM

## 2019-03-23 MED ORDER — AMOXICILLIN-POT CLAVULANATE 875-125 MG PO TABS: 1.0000 | ORAL_TABLET | Freq: Two times a day (BID) | ORAL | 0 refills | Status: DC

## 2019-03-23 MED ORDER — FLUTICASONE PROPIONATE 50 MCG/ACT NA SUSP: 2.0000 | Freq: Every day | NASAL | 1 refills | Status: AC

## 2019-03-23 NOTE — Patient Instructions (Signed)
You do have probable sinus infection symptoms but some concern since your symptoms are lingering over the past 10 days.  Your report of body aches causes some concern for possible viral infection.  I do think is best to go ahead and treat you for sinus infection but at the same time get Covid testing done.  I do recommend that you stay at home and not work pending Covid test results and assessment of clinical response to treatment.  Letter was sent to your MyChart account.  Prescription of Augmentin antibiotic and Flonase nasal spray sent to pharmacy.  We will follow your Covid test results and see how you are doing.  Follow-up in 5 to 7 days or as needed.

## 2019-03-23 NOTE — Progress Notes (Addendum)
   Subjective:    Patient ID: Charles [name], male    DOB: 06/09/1960, 58 y.o.   MRN: [id number]  HPI  Virtual Visit via Telephone Note  I connected with Charles [name] on 03/23/19 at  2:00 PM EDT by telephone and verified that I am speaking with the correct person using two identifiers.  Location: Patient: home Provider: office   I discussed the limitations, risks, security and privacy concerns of performing an evaluation and management service by telephone and the availability of in person appointments. I also discussed with the patient that there may be a patient responsible charge related to this service. The patient expressed understanding and agreed to proceed.   History of Present Illness: Pt had about 10 days sinus symptoms, nasal congestion, body aches, low grade fever and mild fatigue. Also mild ha over sinus  No cough, no sob, no wheezing, no diarrhea, no st and no loss of smell or taste.  No sneezing.   Observations/Objective:  General- no acute distress, pleasant, oriented and normal speech   Assessment and Plan: You do have probable sinus infection symptoms but some concern since your symptoms are lingering over the past 10 days.  Your report of body aches causes some concern for possible viral infection.  I do think is best to go ahead and treat you for sinus infection but at the same time get Covid testing done.  I do recommend that you stay at home and not work pending Covid test results and assessment of clinical response to treatment.  Letter was sent to your MyChart account.  Prescription of Augmentin antibiotic and Flonase nasal spray sent to pharmacy.  We will follow your Covid test results and see how you are doing.  Follow-up in 5 to 7 days or as needed.  Mackie Pai, PA-C  Follow Up Instructions:    I discussed the assessment and treatment plan with the patient. The patient was provided an opportunity to ask questions and all were answered. The  patient agreed with the plan and demonstrated an understanding of the instructions.   The patient was advised to call back or seek an in-person evaluation if the symptoms worsen or if the condition fails to improve as anticipated.  I provided 25  minutes of non-face-to-face time during this encounter. 50 % of time spent counseling on benefit of testing and process of returning to work.  Mackie Pai, PA-C   Review of Systems  Constitutional: Negative for chills, fatigue and fever.  HENT: Positive for congestion, sinus pressure and sinus pain. Negative for sneezing.   Respiratory: Negative for cough, shortness of breath and wheezing.   Cardiovascular: Negative for chest pain and palpitations.  Gastrointestinal: Negative for abdominal pain and blood in stool.  Musculoskeletal: Positive for myalgias. Negative for back pain and neck stiffness.  Skin: Negative for rash.  Neurological: Negative for dizziness, weakness, light-headedness and headaches.  Hematological: Negative for adenopathy. Does not bruise/bleed easily.  Psychiatric/Behavioral: Negative for behavioral problems, confusion and dysphoric mood.       Objective:   Physical Exam        Assessment & Plan:

## 2019-03-24 ENCOUNTER — Other Ambulatory Visit: Payer: Self-pay

## 2019-03-24 DIAGNOSIS — Z20822 Contact with and (suspected) exposure to covid-19: Secondary | ICD-10-CM

## 2019-03-25 ENCOUNTER — Encounter: Payer: Self-pay | Admitting: Medical

## 2019-03-25 LAB — NOVEL CORONAVIRUS, NAA: SARS-CoV-2, NAA: NOT DETECTED

## 2019-04-01 ENCOUNTER — Ambulatory Visit (INDEPENDENT_AMBULATORY_CARE_PROVIDER_SITE_OTHER): Payer: BC Managed Care – PPO | Admitting: Cardiology

## 2019-04-01 ENCOUNTER — Encounter: Payer: Self-pay | Admitting: Cardiology

## 2019-04-01 ENCOUNTER — Other Ambulatory Visit: Payer: Self-pay

## 2019-04-01 VITALS — BP 162/80 | HR 73 | Ht 68.0 in | Wt 262.8 lb

## 2019-04-01 DIAGNOSIS — E785 Hyperlipidemia, unspecified: Secondary | ICD-10-CM | POA: Diagnosis not present

## 2019-04-01 DIAGNOSIS — I10 Essential (primary) hypertension: Secondary | ICD-10-CM | POA: Diagnosis not present

## 2019-04-01 DIAGNOSIS — Z6837 Body mass index (BMI) 37.0-37.9, adult: Secondary | ICD-10-CM

## 2019-04-01 DIAGNOSIS — I2510 Atherosclerotic heart disease of native coronary artery without angina pectoris: Secondary | ICD-10-CM

## 2019-04-01 MED ORDER — NITROGLYCERIN 0.3 MG SL SUBL: 0.3000 mg | SUBLINGUAL_TABLET | SUBLINGUAL | 2 refills | Status: DC | PRN

## 2019-04-01 NOTE — Patient Instructions (Signed)
Medication Instructions:  Your physician recommends that you continue on your current medications as directed. Please refer to the Current Medication list given to you today.  *If you need a refill on your cardiac medications before your next appointment, please call your pharmacy*  Lab Work: Your physician recommends that you return for lab work today: lipids   If you have labs (blood work) drawn today and your tests are completely normal, you will receive your results only by: Marland Kitchen MyChart Message (if you have MyChart) OR . A paper copy in the mail If you have any lab test that is abnormal or we need to change your treatment, we will call you to review the results.  Testing/Procedures: Your physician has requested that you have a lexiscan myoview. For further information please visit [email or web address]. Please follow instruction sheet, as given.    Follow-Up: At [facility], you and your health needs are our priority.  As part of our continuing mission to provide you with exceptional heart care, we have created designated Provider Care Teams.  These Care Teams include your primary Cardiologist (physician) and Advanced Practice Providers (APPs -  Physician Assistants and Nurse Practitioners) who all work together to provide you with the care you need, when you need it.  Your next appointment:   1 month fu   The format for your next appointment:   In Person  Provider:   You may see Dr. Agustin Cree or the following Advanced Practice Provider on your designated Care Team:    Laurann Montana, FNP   Other Instructions   Cardiac Nuclear Scan A cardiac nuclear scan is a test that measures blood flow to the heart when a person is resting and when he or she is exercising. The test looks for problems such as:  Not enough blood reaching a portion of the heart.  The heart muscle not working normally. You may need this test if:  You have heart disease.  You have had abnormal lab  results.  You have had heart surgery or a balloon procedure to open up blocked arteries (angioplasty).  You have chest pain.  You have shortness of breath. In this test, a radioactive dye (tracer) is injected into your bloodstream. After the tracer has traveled to your heart, an imaging device is used to measure how much of the tracer is absorbed by or distributed to various areas of your heart. This procedure is usually done at a hospital and takes 2-4 hours. Tell a health care provider about:  Any allergies you have.  All medicines you are taking, including vitamins, herbs, eye drops, creams, and over-the-counter medicines.  Any problems you or family members have had with anesthetic medicines.  Any blood disorders you have.  Any surgeries you have had.  Any medical conditions you have.  Whether you are pregnant or may be pregnant. What are the risks? Generally, this is a safe procedure. However, problems may occur, including:  Serious chest pain and heart attack. This is only a risk if the stress portion of the test is done.  Rapid heartbeat.  Sensation of warmth in your chest. This usually passes quickly.  Allergic reaction to the tracer. What happens before the procedure?  Ask your health care provider about changing or stopping your regular medicines. This is especially important if you are taking diabetes medicines or blood thinners.  Follow instructions from your health care provider about eating or drinking restrictions.  Remove your jewelry on the day of the procedure. What  happens during the procedure?  An IV will be inserted into one of your veins.  Your health care provider will inject a small amount of radioactive tracer through the IV.  You will wait for 20-40 minutes while the tracer travels through your bloodstream.  Your heart activity will be monitored with an electrocardiogram (ECG).  You will lie down on an exam table.  Images of your heart  will be taken for about 15-20 minutes.  You may also have a stress test. For this test, one of the following may be done: ? You will exercise on a treadmill or stationary bike. While you exercise, your heart's activity will be monitored with an ECG, and your blood pressure will be checked. ? You will be given medicines that will increase blood flow to parts of your heart. This is done if you are unable to exercise.  When blood flow to your heart has peaked, a tracer will again be injected through the IV.  After 20-40 minutes, you will get back on the exam table and have more images taken of your heart.  Depending on the type of tracer used, scans may need to be repeated 3-4 hours later.  Your IV line will be removed when the procedure is over. The procedure may vary among health care providers and hospitals. What happens after the procedure?  Unless your health care provider tells you otherwise, you may return to your normal schedule, including diet, activities, and medicines.  Unless your health care provider tells you otherwise, you may increase your fluid intake. This will help to flush the contrast dye from your body. Drink enough fluid to keep your urine pale yellow.  Ask your health care provider, or the department that is doing the test: ? When will my results be ready? ? How will I get my results? Summary  A cardiac nuclear scan measures the blood flow to the heart when a person is resting and when he or she is exercising.  Tell your health care provider if you are pregnant.  Before the procedure, ask your health care provider about changing or stopping your regular medicines. This is especially important if you are taking diabetes medicines or blood thinners.  After the procedure, unless your health care provider tells you otherwise, increase your fluid intake. This will help flush the contrast dye from your body.  After the procedure, unless your health care provider tells  you otherwise, you may return to your normal schedule, including diet, activities, and medicines. This information is not intended to replace advice given to you by your health care provider. Make sure you discuss any questions you have with your health care provider. Document Released: 06/07/2004 Document Revised: 10/27/2017 Document Reviewed: 10/27/2017 Elsevier Patient Education  2020 [employer].

## 2019-04-01 NOTE — Progress Notes (Signed)
Cardiology Consultation:    Date:  04/01/2019   ID:  Charles [name], DOB [birth year], MRN [id number]  PCP:  Charles [name], Charles C, MD  Cardiologist:  Charles Balsamobert Pharell Rolfson, MD   Referring MD: Charles [name], Charles C, MD   Chief Complaint  Patient presents with  . Follow-up  History of heart problem  History of Present Illness:    Charles BoucheRobert [name] is a 58 y.o. male who is being seen today for the evaluation of atypical chest pain at the request of [name], Charles FoundJessica C, MD.  In 2009 he had some chest tightness he end up having a stress test which was perfectly normal however 2 days later he ended up going to cardiac catheterization and had 95% narrowing of the proximal LAD which was stented.  Since that time in 2010 he had a stress test which was negative and after that he did not have any work-up done.  He does have past medical history significant for hypertension dyslipidemia.  He does not smoke.  He does not exercise on a regular basis.  He can to be established as a patient with chief complaint of chest pain.  He described pain located on the left side of his chest is not related to exercise can last for few minutes at the same time he is able to walk a long distance carrying heavy stuff exercise and have no difficulty doing it.  Because of the sensation that he started developing on the left side of his chest he stopped exercising on a regular basis.  He does some Bowflex machine and also some walking.  He is work required a lot of walking have no difficulty doing it.  Obviously very concerned that he would like to be evaluated for his symptoms.  He tells me that the sensation he gets right now is something completely different to the sensation that he had before.  Past Medical History:  Diagnosis Date  . BPH (benign prostatic hypertrophy) with urinary retention   . CAD (coronary artery disease) 04/2008   successful PCI of the lesion in the proximal LAD using xience drug-eluting stent with improvement  in central narrowing from 95% to 0%.  Dr. Juanda [name]  . Foley catheter in place   . Gout    per pt stable is of 08-18-2014  . History of acute bronchitis    recently 07-16-2014  . History of sepsis    urosepsis--  08-13-2014 admission  . Hyperlipidemia LDL goal <100   . Hypertension   . Multilevel degenerative disc disease    cervical and lumbar  . OSA (obstructive sleep apnea)       . Psoriasis   . S/P drug eluting coronary stent placement    05-13-2008   DES X1 TO pLAD  . Sigmoid diverticulosis     Past Surgical History:  Procedure Laterality Date  . BUNIONECTOMY Right 2000  . COLONOSCOPY WITH PROPOFOL  last one 10-06-2013  . CORONARY ANGIOPLASTY WITH STENT PLACEMENT  05-13-2008    Dr Charles [name]   PCI w/  DES x1 to  pLAD/  ef 65%  . GREEN LIGHT LASER TURP (TRANSURETHRAL RESECTION OF PROSTATE N/A 08/23/2014   Procedure: GREEN LIGHT LASER TURP (TRANSURETHRAL RESECTION OF PROSTATE;  Surgeon: Charles PippinJohn Wrenn, MD;  [facility];  Service: Urology;  Laterality: N/A;  . WRIST GANGLION EXCISION Left 2001    Current Medications: Current Meds  Medication Sig  . allopurinol (ZYLOPRIM) 300 MG tablet TAKE 1 TABLET BY MOUTH EVERY  DAY  . amoxicillin-clavulanate (AUGMENTIN) 875-125 MG tablet Take 1 tablet by mouth 2 (two) times daily.  Marland Kitchen aspirin 325 MG EC tablet Take 325 mg by mouth daily.  . cetirizine (ZYRTEC) 10 MG tablet Take 10 mg by mouth at bedtime.  . clobetasol cream (TEMOVATE) 7.41 % Apply 1 application topically 2 (two) times daily. Apply 1 application topically 2 (two) times daily as needed  . Coenzyme Q10 (CO Q 10 PO) Take 1 capsule by mouth every morning. 500mg   . finasteride (PROSCAR) 5 MG tablet Take 1 tablet (5 mg total) by mouth daily.  . fluocinonide (LIDEX) 0.05 % external solution Apply 1 application topically 2 (two) times daily. As needed for psoriasis  . fluocinonide cream (LIDEX) 2.87 % Apply 1 application topically 2 (two) times daily. As needed  for psoriasis  . FLUoxetine (PROZAC) 20 MG capsule TAKE 1 CAPSULE DAILY---  takes in am  . fluticasone (FLONASE) 50 MCG/ACT nasal spray Place 2 sprays into both nostrils daily.  . metoprolol succinate (TOPROL-XL) 100 MG 24 hr tablet Take 1 tablet (100 mg total) by mouth daily.  . Multiple Vitamin (MULTIVITAMIN) capsule Take 1 capsule by mouth every morning.   . nitroGLYCERIN (NITROSTAT) 0.3 MG SL tablet Place 1 tablet (0.3 mg total) under the tongue every 5 (five) minutes as needed for chest pain.  . Omega-3 Fatty Acids (FISH OIL) 1000 MG CAPS Take 1,000 mg by mouth every morning.   . rosuvastatin (CRESTOR) 20 MG tablet Take 1 tablet (20 mg total) by mouth daily.  . tamsulosin (FLOMAX) 0.4 MG CAPS capsule Take 1 capsule (0.4 mg total) by mouth daily after supper.  . [DISCONTINUED] nitroGLYCERIN (NITROSTAT) 0.3 MG SL tablet Place 1 tablet (0.3 mg total) under the tongue every 5 (five) minutes as needed for chest pain.     Allergies:   Lidocaine, Neomycin sulfate, Sulfa antibiotics, Sulfa drugs cross reactors, Hctz [hydrochlorothiazide], Tramadol, Atrovent [ipratropium], and Latex   Social History   Socioeconomic History  . Marital status: Married    Spouse name: Charles [name]  . Number of children: 1  . Years of education: Manufacturing engineer  . Highest education level: Not on file  Occupational History  . Occupation: Clinical cytogeneticist  . Financial resource strain: Not on file  . Food insecurity    Worry: Not on file    Inability: Not on file  . Transportation needs    Medical: Not on file    Non-medical: Not on file  Tobacco Use  . Smoking status: Former Smoker    Packs/day: 0.75    Years: 14.00    Pack years: 10.50    Types: Cigarettes    Quit date: 05/27/1997    Years since quitting: 21.8  . Smokeless tobacco: Former Systems developer    Types: Snuff    Quit date: 08/17/1997  Substance and Sexual Activity  . Alcohol use: Yes    Comment: occasional  . Drug use: No  . Sexual activity:  Not on file  Lifestyle  . Physical activity    Days per week: Not on file    Minutes per session: Not on file  . Stress: Not on file  Relationships  . Social Herbalist on phone: Not on file    Gets together: Not on file    Attends religious service: Not on file    Active member of club or organization: Not on file    Attends meetings of clubs or organizations: Not  on file    Relationship status: Not on file  Other Topics Concern  . Not on file  Social History Narrative   Patient is married to [name]) has 1 child   Patient is right handed   Education level is Bachelor's degree   Caffeine consumption is 1 daily     Family History: The patient's family history includes Aneurysm in his mother; Diabetes in his sister; Heart disease (age of onset: 97) in his brother; Hypertension in his father; Lung cancer in his mother; Stroke (age of onset: 31) in his father. There is no history of Colon cancer, Pancreatic cancer, Rectal cancer, or Stomach cancer. ROS:   Please see the history of present illness.    All 14 point review of systems negative except as described per history of present illness.  EKGs/Labs/Other Studies Reviewed:    The following studies were reviewed today:   EKG:  EKG is  ordered today.  The ekg ordered today demonstrates normal sinus rhythm, normal P interval, normal QS complex duration morphology  Recent Labs: No results found for requested labs within last 8760 hours.  Recent Lipid Panel    Component Value Date/Time   CHOL 150 09/22/2017 0917   TRIG 290.0 (H) 09/22/2017 0917   HDL 42.90 09/22/2017 0917   CHOLHDL 4 09/22/2017 0917   VLDL 58.0 (H) 09/22/2017 0917   LDLCALC 68 05/08/2012 0945   LDLDIRECT 77.0 09/22/2017 0917    Physical Exam:    VS:  BP (!) 162/80   Pulse 73   Ht 5\' 8"  (1.727 m)   Wt 262 lb 12.8 oz (119.2 kg)   SpO2 97%   BMI 39.96 kg/m     Wt Readings from Last 3 Encounters:  04/01/19 262 lb 12.8 oz (119.2 kg)   03/23/19 245 lb (111.1 kg)  05/26/18 252 lb 8 oz (114.5 kg)     GEN:  Well nourished, well developed in no acute distress HEENT: Normal NECK: No JVD; No carotid bruits LYMPHATICS: No lymphadenopathy CARDIAC: RRR, no murmurs, no rubs, no gallops RESPIRATORY:  Clear to auscultation without rales, wheezing or rhonchi  ABDOMEN: Soft, non-tender, non-distended MUSCULOSKELETAL:  No edema; No deformity  SKIN: Warm and dry NEUROLOGIC:  Alert and oriented x 3 PSYCHIATRIC:  Normal affect   ASSESSMENT:    1. Essential hypertension   2. Coronary artery disease involving native heart without angina pectoris, unspecified vessel or lesion type   3. Hyperlipidemia LDL goal <100   4. Class 2 severe obesity due to excess calories with serious comorbidity and body mass index (BMI) of 37.0 to 37.9 in adult [facility])    PLAN:    In order of problems listed above:  1. Atypical chest pain.  Not related to exercise different compared to the pain that he got more than 10 years ago when he required angioplasty of the proximal LAD.  We had a long discussion about what to do with the situation will maintain him on present medications which include aspirin and statin, I will schedule him to have Lexiscan.  I will also give him prescription for nitroglycerin ask him to use it when he got the pain. 2. 2.  Dyslipidemia we will check his fasting lipid profile he will required intensification of his cholesterol management. 3. Because of fatigue tiredness as well as atypical chest pain I will schedule him to have an echocardiogram to assess left ventricle ejection fraction. 4. For now we will try to assess if he got any  obstructive disease and then will continue discussion about risk factors modification for his coronary artery disease.  Overall we have already started this discussion with talk about exercising on the regular basis advised him not to push himself until we get stress test available.   Medication  Adjustments/Labs and Tests Ordered: Current medicines are reviewed at length with the patient today.  Concerns regarding medicines are outlined above.  Orders Placed This Encounter  Procedures  . Lipid Profile  . MYOCARDIAL PERFUSION IMAGING  . ECHOCARDIOGRAM COMPLETE   Meds ordered this encounter  Medications  . nitroGLYCERIN (NITROSTAT) 0.3 MG SL tablet    Sig: Place 1 tablet (0.3 mg total) under the tongue every 5 (five) minutes as needed for chest pain.    Dispense:  25 tablet    Refill:  2    PLACE 1 TABLET UNDER THE TONGUE AND ALLOW TO DISSOLVE EVERY 5 MINUTESAS NEEDED. ONE AS NEEDED, CAN REPEAT IN 5 MINUTES THEN EMERGENCY ROOM.    Signed, Charles Lea, MD, [facility]. 04/01/2019 12:18 PM    [facility]

## 2019-04-01 NOTE — Addendum Note (Signed)
Addended by: Aleatha Borer on: 04/01/2019 02:34 PM   Modules accepted: Orders

## 2019-04-05 ENCOUNTER — Telehealth (HOSPITAL_COMMUNITY): Payer: Self-pay | Admitting: *Deleted

## 2019-04-05 DIAGNOSIS — I2510 Atherosclerotic heart disease of native coronary artery without angina pectoris: Secondary | ICD-10-CM | POA: Diagnosis not present

## 2019-04-05 NOTE — Telephone Encounter (Signed)
Left message on voicemail in reference to upcoming appointment scheduled for 04/07/19. Phone number given for a call back so details instructions can be given.  Gyanna Jarema Jacqueline   

## 2019-04-06 ENCOUNTER — Telehealth (HOSPITAL_COMMUNITY): Payer: Self-pay | Admitting: *Deleted

## 2019-04-06 ENCOUNTER — Encounter (HOSPITAL_COMMUNITY): Payer: BC Managed Care – PPO

## 2019-04-06 LAB — LIPID PANEL
Chol/HDL Ratio: 3.4 ratio (ref 0.0–5.0)
Cholesterol, Total: 146 mg/dL (ref 100–199)
HDL: 43 mg/dL (ref >39)
LDL Chol Calc (NIH): 65 mg/dL (ref 0–99)
Triglycerides: 231 mg/dL — ABNORMAL HIGH (ref 0–149)
VLDL Cholesterol Cal: 38 mg/dL (ref 5–40)

## 2019-04-06 NOTE — Telephone Encounter (Signed)
Patient given detailed instructions per Myocardial Perfusion Study Information Sheet for the test on 04/07/19. Patient notified to arrive 15 minutes early and that it is imperative to arrive on time for appointment to keep from having the test rescheduled.  If you need to cancel or reschedule your appointment, please call the office within 24 hours of your appointment. . Patient verbalized understanding. Rakel Junio Jacqueline    

## 2019-04-07 ENCOUNTER — Ambulatory Visit (HOSPITAL_COMMUNITY): Payer: BC Managed Care – PPO

## 2019-04-07 ENCOUNTER — Other Ambulatory Visit: Payer: Self-pay

## 2019-04-07 ENCOUNTER — Ambulatory Visit (HOSPITAL_COMMUNITY): Payer: BC Managed Care – PPO | Attending: Cardiology

## 2019-04-07 ENCOUNTER — Ambulatory Visit (HOSPITAL_BASED_OUTPATIENT_CLINIC_OR_DEPARTMENT_OTHER): Payer: BC Managed Care – PPO

## 2019-04-07 VITALS — Ht 68.0 in | Wt 262.0 lb

## 2019-04-07 DIAGNOSIS — I2510 Atherosclerotic heart disease of native coronary artery without angina pectoris: Secondary | ICD-10-CM

## 2019-04-07 LAB — MYOCARDIAL PERFUSION IMAGING
LV dias vol: 99 mL (ref 62–150)
LV sys vol: 40 mL
Peak HR: 76 {beats}/min
Rest HR: 68 {beats}/min
SDS: 1
SRS: 0
SSS: 1
TID: 1.04

## 2019-04-07 LAB — ECHOCARDIOGRAM COMPLETE
Height: 68 in
Weight: 4192 oz

## 2019-04-07 MED ORDER — TECHNETIUM TC 99M TETROFOSMIN IV KIT
32.3000 | PACK | Freq: Once | INTRAVENOUS | Status: AC | PRN
  Administered 2019-04-07: 32.3 via INTRAVENOUS
  Filled 2019-04-07: qty 33

## 2019-04-07 MED ORDER — TECHNETIUM TC 99M TETROFOSMIN IV KIT
10.1000 | PACK | Freq: Once | INTRAVENOUS | Status: AC | PRN
  Administered 2019-04-07: 10.1 via INTRAVENOUS
  Filled 2019-04-07: qty 11

## 2019-04-07 MED ORDER — REGADENOSON 0.4 MG/5ML IV SOLN
0.4000 mg | Freq: Once | INTRAVENOUS | Status: AC
  Administered 2019-04-07: 0.4 mg via INTRAVENOUS

## 2019-04-30 ENCOUNTER — Encounter: Payer: Self-pay | Admitting: Cardiology

## 2019-04-30 ENCOUNTER — Telehealth (INDEPENDENT_AMBULATORY_CARE_PROVIDER_SITE_OTHER): Payer: BC Managed Care – PPO | Admitting: Cardiology

## 2019-04-30 ENCOUNTER — Other Ambulatory Visit: Payer: Self-pay

## 2019-04-30 VITALS — Wt 256.0 lb

## 2019-04-30 DIAGNOSIS — I10 Essential (primary) hypertension: Secondary | ICD-10-CM | POA: Diagnosis not present

## 2019-04-30 DIAGNOSIS — E782 Mixed hyperlipidemia: Secondary | ICD-10-CM | POA: Diagnosis not present

## 2019-04-30 DIAGNOSIS — I2510 Atherosclerotic heart disease of native coronary artery without angina pectoris: Secondary | ICD-10-CM | POA: Diagnosis not present

## 2019-04-30 DIAGNOSIS — Z7982 Long term (current) use of aspirin: Secondary | ICD-10-CM

## 2019-04-30 MED ORDER — ASPIRIN EC 81 MG PO TBEC: 81.0000 mg | DELAYED_RELEASE_TABLET | Freq: Every day | ORAL | 1 refills | Status: DC

## 2019-04-30 MED ORDER — METOPROLOL SUCCINATE ER 100 MG PO TB24: 100.0000 mg | ORAL_TABLET | Freq: Every day | ORAL | 3 refills | Status: DC

## 2019-04-30 MED ORDER — ROSUVASTATIN CALCIUM 20 MG PO TABS: 20.0000 mg | ORAL_TABLET | Freq: Every day | ORAL | 3 refills | Status: DC

## 2019-04-30 NOTE — Addendum Note (Signed)
Addended by: Ashok [name] on: 04/30/2019 09:04 AM   Modules accepted: Orders

## 2019-04-30 NOTE — Patient Instructions (Signed)
Medication Instructions:  Your physician has recommended you make the following change in your medication:   DECREASE: Aspirin to 81 mg daily   *If you need a refill on your cardiac medications before your next appointment, please call your pharmacy*  Lab Work: None.  If you have labs (blood work) drawn today and your tests are completely normal, you will receive your results only by: Marland Kitchen MyChart Message (if you have MyChart) OR . A paper copy in the mail If you have any lab test that is abnormal or we need to change your treatment, we will call you to review the results.  Testing/Procedures: None   Follow-Up: At [facility], you and your health needs are our priority.  As part of our continuing mission to provide you with exceptional heart care, we have created designated Provider Care Teams.  These Care Teams include your primary Cardiologist (physician) and Advanced Practice Providers (APPs -  Physician Assistants and Nurse Practitioners) who all work together to provide you with the care you need, when you need it.  Your next appointment:   5 month(s)  The format for your next appointment:   In Person  Provider:   You may see Dr. Agustin Cree or the following Advanced Practice Provider on your designated Care Team:    Laurann Montana, FNP   Other Instructions

## 2019-04-30 NOTE — Progress Notes (Signed)
Virtual Visit via Telephone Note   This visit type was conducted due to national recommendations for restrictions regarding the COVID-19 Pandemic (e.g. social distancing) in an effort to limit this patient's exposure and mitigate transmission in our community.  Due to his co-morbid illnesses, this patient is at least at moderate risk for complications without adequate follow up.  This format is felt to be most appropriate for this patient at this time.  The patient did not have access to video technology/had technical difficulties with video requiring transitioning to audio format only (telephone).  All issues noted in this document were discussed and addressed.  No physical exam could be performed with this format.  Please refer to the patient's chart for his  consent to telehealth for [facility].  Evaluation Performed:  Follow-up visit  This visit type was conducted due to national recommendations for restrictions regarding the COVID-19 Pandemic (e.g. social distancing).  This format is felt to be most appropriate for this patient at this time.  All issues noted in this document were discussed and addressed.  No physical exam was performed (except for noted visual exam findings with Video Visits).  Please refer to the patient's chart (MyChart message for video visits and phone note for telephone visits) for the patient's consent to telehealth for [facility].  Date:  04/30/2019  ID: Charles [name], DOB 1960-12-02, MRN [id number]   Patient Location: [address] [place] [address]   Provider location:   [facility] [place] [facility]  PCP:  Pearline Cables, MD  Cardiologist:  Gypsy Balsam, MD     Chief Complaint: Doing well  History of Present Illness:    Charles [name] is a 58 y.o. male  who presents via audio/video conferencing for a telehealth visit today.  With remote history of coronary artery disease.  Apparently 10 years ago he had some symptoms stress  test was done which was negative and then few days later he ended up going to the cardiac Cath Lab with 95% of proximal LAD.  That was addressed with drug-eluting stent and since that time he seems to be doing well.  He was actually presented to me with some atypical chest pain we end up doing another stress test which was normal as well as echocardiogram both showed quite encouraging results of but of course with his history of having problem quickly after stress test being abnormal we have to be very vigilant.  He is doing better.  He said that all symptoms he had completely subsided he started exercising on the regular basis again it may be 3-4 times a week.  Denies having any chest pain, tightness, pressure, burning in the chest.  I did review echocardiogram with him as well as stress test.  We did look at his fasting lipid profile and we talked in length about the fact that he does have triglycerides which are elevated but luckily the trend is down with his triglycerides 3 years ago almost 400 down to 290 and now 230.  I talked about dieting I talked about weight loss and we did talk about exercises on the regular basis which should help with triglycerides.  If that will not be sufficient he may require to go on the medication.  In the meantime we will maintain him on statin.   The patient does not have symptoms concerning for COVID-19 infection (fever, chills, cough, or new SHORTNESS OF BREATH).    Prior CV studies:   The  following studies were reviewed today:  Stress test from 04/07/2019 showed:  Nuclear stress EF: 59%. No wall motion abnormalities  There was no ST segment deviation noted during stress.  Defect 1: There is a small defect of mild severity present in the apical inferior location.  This is a low risk study. No ischemia identified.    Echocardiogram from 04/07/2019 showed:  1. Left ventricular ejection fraction, by visual estimation, is 55 to 60%. The left ventricle has  normal function. There is no left ventricular hypertrophy.  2. Global right ventricle has normal systolic function.The right ventricular size is normal. No increase in right ventricular wall thickness.  3. Left atrial size was normal.  4. Right atrial size was normal.  5. The mitral valve is normal in structure. No evidence of mitral valve regurgitation.  6. The tricuspid valve is normal in structure. Tricuspid valve regurgitation is not demonstrated.  7. The aortic valve is normal in structure. Aortic valve regurgitation is not visualized.  8. The pulmonic valve was normal in structure. Pulmonic valve regurgitation is not visualized.  9. The atrial septum is grossly normal. 10. The average left ventricular global longitudinal strain is -18.6 %.        Past Medical History:  Diagnosis Date  . BPH (benign prostatic hypertrophy) with urinary retention   . CAD (coronary artery disease) 04/2008   successful PCI of the lesion in the proximal LAD using xience drug-eluting stent with improvement in central narrowing from 95% to 0%.  Dr. Juanda Chance  . Foley catheter in place   . Gout    per pt stable is of 08-18-2014  . History of acute bronchitis    recently 07-16-2014  . History of sepsis    urosepsis--  08-13-2014 admission  . Hyperlipidemia LDL goal <100   . Hypertension   . Multilevel degenerative disc disease    cervical and lumbar  . OSA (obstructive sleep apnea)       . Psoriasis   . S/P drug eluting coronary stent placement    05-13-2008   DES X1 TO pLAD  . Sigmoid diverticulosis     Past Surgical History:  Procedure Laterality Date  . BUNIONECTOMY Right 2000  . COLONOSCOPY WITH PROPOFOL  last one 10-06-2013  . CORONARY ANGIOPLASTY WITH STENT PLACEMENT  05-13-2008    Dr Charlies Constable   PCI w/  DES x1 to  pLAD/  ef 65%  . GREEN LIGHT LASER TURP (TRANSURETHRAL RESECTION OF PROSTATE N/A 08/23/2014   Procedure: GREEN LIGHT LASER TURP (TRANSURETHRAL RESECTION OF PROSTATE;   Surgeon: Bjorn Pippin, MD;  Location: [facility];  Service: Urology;  Laterality: N/A;  . WRIST GANGLION EXCISION Left 2001     Current Meds  Medication Sig  . allopurinol (ZYLOPRIM) 300 MG tablet TAKE 1 TABLET BY MOUTH EVERY DAY  . aspirin 325 MG EC tablet Take 325 mg by mouth daily.  . cetirizine (ZYRTEC) 10 MG tablet Take 10 mg by mouth at bedtime.  . clobetasol cream (TEMOVATE) 0.05 % Apply 1 application topically 2 (two) times daily. Apply 1 application topically 2 (two) times daily as needed  . Coenzyme Q10 (CO Q 10 PO) Take 1 capsule by mouth every morning. 500mg   . finasteride (PROSCAR) 5 MG tablet Take 1 tablet (5 mg total) by mouth daily.  . fluocinonide (LIDEX) 0.05 % external solution Apply 1 application topically 2 (two) times daily. As needed for psoriasis  . fluocinonide cream (LIDEX) 0.05 % Apply 1  application topically 2 (two) times daily. As needed for psoriasis  . FLUoxetine (PROZAC) 20 MG capsule TAKE 1 CAPSULE DAILY---  takes in am  . fluticasone (FLONASE) 50 MCG/ACT nasal spray Place 2 sprays into both nostrils daily.  . metoprolol succinate (TOPROL-XL) 100 MG 24 hr tablet Take 1 tablet (100 mg total) by mouth daily.  . Multiple Vitamin (MULTIVITAMIN) capsule Take 1 capsule by mouth every morning.   . nitroGLYCERIN (NITROSTAT) 0.3 MG SL tablet Place 1 tablet (0.3 mg total) under the tongue every 5 (five) minutes as needed for chest pain.  . Omega-3 Fatty Acids (FISH OIL) 1000 MG CAPS Take 1,000 mg by mouth every morning.   . rosuvastatin (CRESTOR) 20 MG tablet Take 1 tablet (20 mg total) by mouth daily.  . tamsulosin (FLOMAX) 0.4 MG CAPS capsule Take 1 capsule (0.4 mg total) by mouth daily after supper.      Family History: The patient's family history includes Aneurysm in his mother; Diabetes in his sister; Heart disease (age of onset: 83) in his brother; Hypertension in his father; Lung cancer in his mother; Stroke (age of onset: 63) in his father.  There is no history of Colon cancer, Pancreatic cancer, Rectal cancer, or Stomach cancer.   ROS:   Please see the history of present illness.     All other systems reviewed and are negative.   Labs/Other Tests and Data Reviewed:     Recent Labs: No results found for requested labs within last 8760 hours.  Recent Lipid Panel    Component Value Date/Time   CHOL 146 04/05/2019 0843   TRIG 231 (H) 04/05/2019 0843   HDL 43 04/05/2019 0843   CHOLHDL 3.4 04/05/2019 0843   CHOLHDL 4 09/22/2017 0917   VLDL 58.0 (H) 09/22/2017 0917   LDLCALC 65 04/05/2019 0843   LDLDIRECT 77.0 09/22/2017 0917      Exam:    Vital Signs:  Wt 256 lb (116.1 kg)   BMI 38.92 kg/m     Wt Readings from Last 3 Encounters:  04/30/19 256 lb (116.1 kg)  04/07/19 262 lb (118.8 kg)  04/01/19 262 lb 12.8 oz (119.2 kg)     Well nourished, well developed in no acute distress. Alert awake and x3 talking to me from the phone I am sitting in our office in [facility].  He is asymptomatic not in any distress quite happy to be able to talk to me.  Diagnosis for this visit:   1. Coronary artery disease involving native coronary artery of native heart without angina pectoris   2. Essential hypertension   3. Mixed hyperlipidemia      ASSESSMENT & PLAN:    1.  Coronary disease stable recent stress test negative.  He is asymptomatic right now I encouraged him to stick with good diet exercise on the regular basis.  I asked most to reduce aspirin from 325 to 81 mg daily. 2.  Essential hypertension blood pressure well controlled continue present management. 3.  Mixed dyslipidemia with high triglycerides.  Cholesterol is back acceptable but triglycerides are not we will talk about diet exercises and then in about 6 months we will recheck his cholesterol including triglycerides.  Based on that we will decide how to proceed.  COVID-19 Education: The signs and symptoms of COVID-19 were discussed with the patient and  how to seek care for testing (follow up with PCP or arrange E-visit).  The importance of social distancing was discussed today.  Patient Risk:  After full review of this patients clinical status, I feel that they are at least moderate risk at this time.  Time:   Today, I have spent 5 minutes with the patient with telehealth technology discussing pt health issues.  I spent 18 minutes reviewing her chart before the visit.  Visit was finished at 8:52 AM.    Medication Adjustments/Labs and Tests Ordered: Current medicines are reviewed at length with the patient today.  Concerns regarding medicines are outlined above.  No orders of the defined types were placed in this encounter.  Medication changes: No orders of the defined types were placed in this encounter.    Disposition: Follow-up 5 months  Signed, Georgeanna Leaobert J. Kojo Liby, MD, [facility] Hospital[facility]ACC 04/30/2019 8:48 AM    [facility]

## 2019-05-14 ENCOUNTER — Encounter: Payer: Self-pay | Admitting: Family Medicine

## 2019-05-14 MED ORDER — ALLOPURINOL 300 MG PO TABS: ORAL_TABLET | ORAL | 0 refills | Status: DC

## 2019-06-07 ENCOUNTER — Encounter: Payer: Self-pay | Admitting: Family

## 2019-06-07 ENCOUNTER — Ambulatory Visit (INDEPENDENT_AMBULATORY_CARE_PROVIDER_SITE_OTHER): Payer: BC Managed Care – PPO | Admitting: Family

## 2019-06-07 ENCOUNTER — Other Ambulatory Visit: Payer: Self-pay

## 2019-06-07 VITALS — Temp 98.6°F

## 2019-06-07 DIAGNOSIS — J019 Acute sinusitis, unspecified: Secondary | ICD-10-CM

## 2019-06-07 DIAGNOSIS — H6692 Otitis media, unspecified, left ear: Secondary | ICD-10-CM | POA: Diagnosis not present

## 2019-06-07 MED ORDER — AMOXICILLIN-POT CLAVULANATE 875-125 MG PO TABS: 1.0000 | ORAL_TABLET | Freq: Two times a day (BID) | ORAL | 0 refills | Status: DC

## 2019-06-07 NOTE — Progress Notes (Signed)
Virtual Visit via Video Note  I connected with Charles [name] on 06/07/19 at 10:20 AM EST by a video enabled telemedicine application and verified that I am speaking with the correct person using two identifiers.  Location: Patient: home Provider: home   I discussed the limitations of evaluation and management by telemedicine and the availability of in person appointments. The patient expressed understanding and agreed to proceed.  History of Present Illness:  Patient is a 59 yr old male who presents today with c/o sinus congestion, mild blood from nose.  Bilateral ears have pressure.  + pain in the left ear. Having some associated vertigo when he first gets up in the AM.  This gets better as the day wears on.  Symptoms started 1 week ago. Denies associated fever.   Past Medical History:  Diagnosis Date  . BPH (benign prostatic hypertrophy) with urinary retention   . CAD (coronary artery disease) 04/2008   successful PCI of the lesion in the proximal LAD using xience drug-eluting stent with improvement in central narrowing from 95% to 0%.  Dr. Olevia Perches  . Foley catheter in place   . Gout    per pt stable is of 08-18-2014  . History of acute bronchitis    recently 07-16-2014  . History of sepsis    urosepsis--  08-13-2014 admission  . Hyperlipidemia LDL goal <100   . Hypertension   . Multilevel degenerative disc disease    cervical and lumbar  . OSA (obstructive sleep apnea)       . Psoriasis   . S/P drug eluting coronary stent placement    05-13-2008   DES X1 TO pLAD  . Sigmoid diverticulosis      Social History   Socioeconomic History  . Marital status: Married    Spouse name: Rodena Piety  . Number of children: 1  . Years of education: Manufacturing engineer  . Highest education level: Not on file  Occupational History  . Occupation: Games developer  Tobacco Use  . Smoking status: Former Smoker    Packs/day: 0.75    Years: 14.00    Pack years: 10.50    Types: Cigarettes   Quit date: 05/27/1997    Years since quitting: 22.0  . Smokeless tobacco: Former Systems developer    Types: Snuff    Quit date: 08/17/1997  Substance and Sexual Activity  . Alcohol use: Yes    Comment: occasional  . Drug use: No  . Sexual activity: Not on file  Other Topics Concern  . Not on file  Social History Narrative   Patient is married to Rodena Piety) has 1 child   Patient is right handed   Education level is Bachelor's degree   Caffeine consumption is 1 daily   Social Determinants of Radio broadcast assistant Strain:   . Difficulty of Paying Living Expenses: Not on file  Food Insecurity:   . Worried About Charity fundraiser in the Last Year: Not on file  . Ran Out of Food in the Last Year: Not on file  Transportation Needs:   . Lack of Transportation (Medical): Not on file  . Lack of Transportation (Non-Medical): Not on file  Physical Activity:   . Days of Exercise per Week: Not on file  . Minutes of Exercise per Session: Not on file  Stress:   . Feeling of Stress : Not on file  Social Connections:   . Frequency of Communication with Friends and Family: Not on file  . Frequency  of Social Gatherings with Friends and Family: Not on file  . Attends Religious Services: Not on file  . Active Member of Clubs or Organizations: Not on file  . Attends Banker Meetings: Not on file  . Marital Status: Not on file  Intimate Partner Violence:   . Fear of Current or Ex-Partner: Not on file  . Emotionally Abused: Not on file  . Physically Abused: Not on file  . Sexually Abused: Not on file    Past Surgical History:  Procedure Laterality Date  . BUNIONECTOMY Right 2000  . COLONOSCOPY WITH PROPOFOL  last one 10-06-2013  . CORONARY ANGIOPLASTY WITH STENT PLACEMENT  05-13-2008    Dr Charlies Constable   PCI w/  DES x1 to  pLAD/  ef 65%  . GREEN LIGHT LASER TURP (TRANSURETHRAL RESECTION OF PROSTATE N/A 08/23/2014   Procedure: GREEN LIGHT LASER TURP (TRANSURETHRAL RESECTION OF PROSTATE;   Surgeon: Bjorn Pippin, MD;  Location: [facility];  Service: Urology;  Laterality: N/A;  . WRIST GANGLION EXCISION Left 2001    Family History  Problem Relation Age of Onset  . Hypertension Father   . Stroke Father 1  . Lung cancer Mother         smoker  . Aneurysm Mother   . Diabetes Sister   . Heart disease Brother 49        stent LAD 2012  . Colon cancer Neg Hx   . Pancreatic cancer Neg Hx   . Rectal cancer Neg Hx   . Stomach cancer Neg Hx     Allergies  Allergen Reactions  . Lidocaine Anaphylaxis    REACTION: anaphylactic shock (also he was on Neomycin sulfate)  . Neomycin Sulfate Anaphylaxis    REACTION: anaphylactic shock (also on Lidocaine  topically)  . Sulfa Antibiotics Anaphylaxis  . Sulfa Drugs Cross Reactors Anaphylaxis  . Hctz [Hydrochlorothiazide] Other (See Comments)    Gout   . Tramadol Other (See Comments)    05/26/13 urinary retention  . Atrovent [Ipratropium] Other (See Comments)    Caused Urinary retention (this is the nasal spray)  . Latex Itching and Rash    Skin irritation    Current Outpatient Medications on File Prior to Visit  Medication Sig Dispense Refill  . allopurinol (ZYLOPRIM) 300 MG tablet TAKE 1 TABLET BY MOUTH EVERY DAY 90 tablet 0  . aspirin 81 MG tablet Take 1 tablet (81 mg total) by mouth daily. 90 tablet 1  . cetirizine (ZYRTEC) 10 MG tablet Take 10 mg by mouth at bedtime.    . clobetasol cream (TEMOVATE) 0.05 % Apply 1 application topically 2 (two) times daily. Apply 1 application topically 2 (two) times daily as needed 60 g 3  . Coenzyme Q10 (CO Q 10 PO) Take 1 capsule by mouth every morning. 500mg     . finasteride (PROSCAR) 5 MG tablet Take 1 tablet (5 mg total) by mouth daily. 30 tablet 0  . fluocinonide (LIDEX) 0.05 % external solution Apply 1 application topically 2 (two) times daily. As needed for psoriasis 60 mL 2  . fluocinonide cream (LIDEX) 0.05 % Apply 1 application topically 2 (two) times daily. As  needed for psoriasis 60 g 2  . FLUoxetine (PROZAC) 20 MG capsule TAKE 1 CAPSULE DAILY---  takes in am 90 capsule 3  . fluticasone (FLONASE) 50 MCG/ACT nasal spray Place 2 sprays into both nostrils daily. 16 g 1  . metoprolol succinate (TOPROL-XL) 100 MG 24 hr tablet Take  1 tablet (100 mg total) by mouth daily. 90 tablet 3  . Multiple Vitamin (MULTIVITAMIN) capsule Take 1 capsule by mouth every morning.     . nitroGLYCERIN (NITROSTAT) 0.3 MG SL tablet Place 1 tablet (0.3 mg total) under the tongue every 5 (five) minutes as needed for chest pain. 25 tablet 2  . Omega-3 Fatty Acids (FISH OIL) 1000 MG CAPS Take 1,000 mg by mouth every morning.     . rosuvastatin (CRESTOR) 20 MG tablet Take 1 tablet (20 mg total) by mouth daily. 90 tablet 3  . tamsulosin (FLOMAX) 0.4 MG CAPS capsule Take 1 capsule (0.4 mg total) by mouth daily after supper. 30 capsule 0   No current facility-administered medications on file prior to visit.    Temp 98.6 F (37 C) (Oral)    Observations/Objective:   Gen: Awake, alert, no acute distress Resp: Breathing is even and non-labored Psych: calm/pleasant demeanor Neuro: Alert and Oriented x 3, + facial symmetry, speech is clear.   Assessment and Plan:  Left otitis media/sinusitis- will rx with augmentin. Pt is advised to call if symptoms worse or if symptoms are not improved in 2-3 days.   Follow Up Instructions:    I discussed the assessment and treatment plan with the patient. The patient was provided an opportunity to ask questions and all were answered. The patient agreed with the plan and demonstrated an understanding of the instructions.   The patient was advised to call back or seek an in-person evaluation if the symptoms worsen or if the condition fails to improve as anticipated.  Lemont Fillers, NP

## 2019-06-08 ENCOUNTER — Other Ambulatory Visit: Payer: Self-pay | Admitting: Family Medicine

## 2019-06-08 MED ORDER — FLUOXETINE HCL 20 MG PO CAPS: ORAL_CAPSULE | ORAL | 0 refills | Status: DC

## 2019-06-08 NOTE — Telephone Encounter (Signed)
Pt requested refill for FLUoxetine (PROZAC) 20 MG capsule thru mychart appt request. CPE scheduled for 06/28/2019 with Dr. Patsy Lager. Please advise.   [id number] - [place], [place] - [address] AT [facility] OF [address] Phone:  [phone]  Fax:  [phone]

## 2019-06-17 ENCOUNTER — Other Ambulatory Visit: Payer: Self-pay

## 2019-06-17 ENCOUNTER — Ambulatory Visit: Payer: BC Managed Care – PPO | Admitting: Medical

## 2019-06-17 ENCOUNTER — Encounter: Payer: Self-pay | Admitting: Medical

## 2019-06-17 VITALS — BP 150/78 | HR 65 | Temp 96.9°F | Resp 16 | Ht 68.0 in | Wt 262.2 lb

## 2019-06-17 DIAGNOSIS — J019 Acute sinusitis, unspecified: Secondary | ICD-10-CM

## 2019-06-17 MED ORDER — CEFTRIAXONE SODIUM 1 G IJ SOLR
1.0000 g | Freq: Once | INTRAMUSCULAR | Status: AC
  Administered 2019-06-17: 1 g via INTRAMUSCULAR

## 2019-06-17 MED ORDER — PREDNISONE 10 MG PO TABS: ORAL_TABLET | ORAL | 0 refills | Status: DC

## 2019-06-17 MED ORDER — DOXYCYCLINE HYCLATE 100 MG PO TABS: 100.0000 mg | ORAL_TABLET | Freq: Two times a day (BID) | ORAL | 0 refills | Status: DC

## 2019-06-17 MED ORDER — METHYLPREDNISOLONE ACETATE 40 MG/ML IJ SUSP
40.0000 mg | Freq: Once | INTRAMUSCULAR | Status: AC
  Administered 2019-06-17: 12:00:00 40 mg via INTRAMUSCULAR

## 2019-06-17 MED FILL — DOXYCYCLINE HYCLATE 100 MG: 100 | 10 days supply | Qty: 20 | Fill #0

## 2019-06-17 MED FILL — predniSONE 10 MG TABS: 10 | 4 days supply | Qty: 10 | Fill #0

## 2019-06-17 NOTE — Patient Instructions (Signed)
You appear to have partial improvement but persisting  sinus infection despite use of augmentin.   We gave you rocephin 1 gram im in office and depomedrol 40 mg im.  Rx doxycycline sent to pharmacy. Rx advisement given. Also 4 day taper prednisone.  If signs/symptoms persist or worsen may need ent referral, ct scan. Maybe covid if new accompanying symptoms.(not suspicious for covid presently)  Follow up 7-10 days or as needed

## 2019-06-17 NOTE — Progress Notes (Signed)
Subjective:    Patient ID: Charles [name], male    DOB: 03-09-1961, 59 y.o.   MRN: [id number]  HPI  Pt I states 3 weeks ago started to get fullness sinus pressure sensation and some ear pressure. Pt had some dizziness at onset. Pt seen about 10 days ago and given augmentin. His symptoms improve but not completely better. States dizziness resolved but only about 50% in term of pressure sensation. Pt has been using netty pot and states some mucus with blood tinge to mucus.   No fever, no chills, no sweats, no body aches, no loss of sense of smell and no diarrhea.   No known exposure to covid.    Review of Systems  Constitutional: Negative for chills, fatigue and fever.  HENT: Positive for ear pain, sinus pressure and sinus pain. Negative for sore throat.   Respiratory: Negative for cough, chest tightness, shortness of breath and wheezing.   Cardiovascular: Negative for chest pain and palpitations.  Gastrointestinal: Negative for abdominal pain.  Musculoskeletal: Negative for back pain.  Skin: Negative for rash.  Neurological: Negative for dizziness, speech difficulty, numbness and headaches.  Hematological: Negative for adenopathy. Does not bruise/bleed easily.  Psychiatric/Behavioral: Negative for behavioral problems and confusion.    Past Medical History:  Diagnosis Date  . BPH (benign prostatic hypertrophy) with urinary retention   . CAD (coronary artery disease) 04/2008   successful PCI of the lesion in the proximal LAD using xience drug-eluting stent with improvement in central narrowing from 95% to 0%.  Dr. Olevia Perches  . Foley catheter in place   . Gout    per pt stable is of 08-18-2014  . History of acute bronchitis    recently 07-16-2014  . History of sepsis    urosepsis--  08-13-2014 admission  . Hyperlipidemia LDL goal <100   . Hypertension   . Multilevel degenerative disc disease    cervical and lumbar  . OSA (obstructive sleep apnea)       . Psoriasis   . S/P  drug eluting coronary stent placement    05-13-2008   DES X1 TO pLAD  . Sigmoid diverticulosis      Social History   Socioeconomic History  . Marital status: Married    Spouse name: Rodena Piety  . Number of children: 1  . Years of education: Manufacturing engineer  . Highest education level: Not on file  Occupational History  . Occupation: Games developer  Tobacco Use  . Smoking status: Former Smoker    Packs/day: 0.75    Years: 14.00    Pack years: 10.50    Types: Cigarettes    Quit date: 05/27/1997    Years since quitting: 22.0  . Smokeless tobacco: Former Systems developer    Types: Snuff    Quit date: 08/17/1997  Substance and Sexual Activity  . Alcohol use: Yes    Comment: occasional  . Drug use: No  . Sexual activity: Not on file  Other Topics Concern  . Not on file  Social History Narrative   Patient is married to Rodena Piety) has 1 child   Patient is right handed   Education level is Bachelor's degree   Caffeine consumption is 1 daily   Social Determinants of Radio broadcast assistant Strain:   . Difficulty of Paying Living Expenses: Not on file  Food Insecurity:   . Worried About Charity fundraiser in the Last Year: Not on file  . Ran Out of Food in the Last Year:  Not on file  Transportation Needs:   . Lack of Transportation (Medical): Not on file  . Lack of Transportation (Non-Medical): Not on file  Physical Activity:   . Days of Exercise per Week: Not on file  . Minutes of Exercise per Session: Not on file  Stress:   . Feeling of Stress : Not on file  Social Connections:   . Frequency of Communication with Friends and Family: Not on file  . Frequency of Social Gatherings with Friends and Family: Not on file  . Attends Religious Services: Not on file  . Active Member of Clubs or Organizations: Not on file  . Attends Banker Meetings: Not on file  . Marital Status: Not on file  Intimate Partner Violence:   . Fear of Current or Ex-Partner: Not on file  .  Emotionally Abused: Not on file  . Physically Abused: Not on file  . Sexually Abused: Not on file    Past Surgical History:  Procedure Laterality Date  . BUNIONECTOMY Right 2000  . COLONOSCOPY WITH PROPOFOL  last one 10-06-2013  . CORONARY ANGIOPLASTY WITH STENT PLACEMENT  05-13-2008    Dr Charlies Constable   PCI w/  DES x1 to  pLAD/  ef 65%  . GREEN LIGHT LASER TURP (TRANSURETHRAL RESECTION OF PROSTATE N/A 08/23/2014   Procedure: GREEN LIGHT LASER TURP (TRANSURETHRAL RESECTION OF PROSTATE;  Surgeon: Bjorn Pippin, MD;  Location[facility];  Service: Urology;  Laterality: N/A;  . WRIST GANGLION EXCISION Left 2001    Family History  Problem Relation Age of Onset  . Hypertension Father   . Stroke Father 39  . Lung cancer Mother         smoker  . Aneurysm Mother   . Diabetes Sister   . Heart disease Brother 49        stent LAD 2012  . Colon cancer Neg Hx   . Pancreatic cancer Neg Hx   . Rectal cancer Neg Hx   . Stomach cancer Neg Hx     Allergies  Allergen Reactions  . Lidocaine Anaphylaxis    REACTION: anaphylactic shock (also he was on Neomycin sulfate)  . Neomycin Sulfate Anaphylaxis    REACTION: anaphylactic shock (also on Lidocaine  topically)  . Sulfa Antibiotics Anaphylaxis  . Sulfa Drugs Cross Reactors Anaphylaxis  . Hctz [Hydrochlorothiazide] Other (See Comments)    Gout   . Tramadol Other (See Comments)    05/26/13 urinary retention  . Atrovent [Ipratropium] Other (See Comments)    Caused Urinary retention (this is the nasal spray)  . Latex Itching and Rash    Skin irritation    Current Outpatient Medications on File Prior to Visit  Medication Sig Dispense Refill  . allopurinol (ZYLOPRIM) 300 MG tablet TAKE 1 TABLET BY MOUTH EVERY DAY 90 tablet 0  . aspirin 81 MG tablet Take 1 tablet (81 mg total) by mouth daily. 90 tablet 1  . cetirizine (ZYRTEC) 10 MG tablet Take 10 mg by mouth at bedtime.    . clobetasol cream (TEMOVATE) 0.05 % Apply 1  application topically 2 (two) times daily. Apply 1 application topically 2 (two) times daily as needed 60 g 3  . Coenzyme Q10 (CO Q 10 PO) Take 1 capsule by mouth every morning. 500mg     . finasteride (PROSCAR) 5 MG tablet Take 1 tablet (5 mg total) by mouth daily. 30 tablet 0  . fluocinonide (LIDEX) 0.05 % external solution Apply 1 application topically  2 (two) times daily. As needed for psoriasis 60 mL 2  . fluocinonide cream (LIDEX) 0.05 % Apply 1 application topically 2 (two) times daily. As needed for psoriasis 60 g 2  . FLUoxetine (PROZAC) 20 MG capsule TAKE 1 CAPSULE DAILY---  takes in am 30 capsule 0  . fluticasone (FLONASE) 50 MCG/ACT nasal spray Place 2 sprays into both nostrils daily. 16 g 1  . metoprolol succinate (TOPROL-XL) 100 MG 24 hr tablet Take 1 tablet (100 mg total) by mouth daily. 90 tablet 3  . Multiple Vitamin (MULTIVITAMIN) capsule Take 1 capsule by mouth every morning.     . nitroGLYCERIN (NITROSTAT) 0.3 MG SL tablet Place 1 tablet (0.3 mg total) under the tongue every 5 (five) minutes as needed for chest pain. 25 tablet 2  . Omega-3 Fatty Acids (FISH OIL) 1000 MG CAPS Take 1,000 mg by mouth every morning.     . rosuvastatin (CRESTOR) 20 MG tablet Take 1 tablet (20 mg total) by mouth daily. 90 tablet 3  . tamsulosin (FLOMAX) 0.4 MG CAPS capsule Take 1 capsule (0.4 mg total) by mouth daily after supper. 30 capsule 0   No current facility-administered medications on file prior to visit.    BP (!) 150/78 (BP Location: Left Arm, Patient Position: Sitting, Cuff Size: Large)   Pulse 65   Temp (!) 96.9 F (36.1 C) (Temporal)   Resp 16   Ht 5\' 8"  (1.727 m)   Wt 262 lb 3.2 oz (118.9 kg)   SpO2 98%   BMI 39.87 kg/m       Objective:   Physical Exam   General  Mental Status - Alert. General Appearance - Well groomed. Not in acute distress.  Skin Rashes- No Rashes.  HEENT Head- Normal. Ear Auditory Canal - Left- Normal. Right - Normal.Tympanic Membrane- Left-  Normal. Right- Normal. Eye Sclera/Conjunctiva- Left- Normal. Right- Normal. Nose & Sinuses Nasal Mucosa- Left-  Boggy and Congested. Right-  Boggy and  Congested.left side moderate maxillary and mild left  frontal sinus pressure. Mouth & Throat Lips: Upper Lip- Normal: no dryness, cracking, pallor, cyanosis, or vesicular eruption. Lower Lip-Normal: no dryness, cracking, pallor, cyanosis or vesicular eruption. Buccal Mucosa- Bilateral- No Aphthous ulcers. Oropharynx- No Discharge or Erythema. Tonsils: Characteristics- Bilateral- No Erythema or Congestion. Size/Enlargement- Bilateral- No enlargement. Discharge- bilateral-None.  Neck Neck- Supple. No Masses.   Chest and Lung Exam Auscultation: Breath Sounds:-Clear even and unlabored.  Cardiovascular Auscultation:Rythm- Regular, rate and rhythm. Murmurs & Other Heart Sounds:Ausculatation of the heart reveal- No Murmurs.  Lymphatic Head & Neck General Head & Neck Lymphatics: Bilateral: Description- No Localized lymphadenopathy.     Assessment & Plan:  You appear to have partial improvement but persisting  sinus infection despite use of augmentin.   We gave you rocephin 1 gram im in office and depomedrol 40 mg im.  Rx doxycycline sent to pharmacy. Rx advisement given. Also 4 day taper prednisone.  If signs/symptoms persist or worsen may need ent referral, ct scan. Maybe covid if new accompanying symptoms.(not suspicious for covid presently)  Follow up 7-10 days or as needed   , [employer]

## 2019-06-24 ENCOUNTER — Encounter: Payer: Self-pay | Admitting: Family Medicine

## 2019-06-24 ENCOUNTER — Telehealth: Payer: Self-pay | Admitting: Family Medicine

## 2019-06-24 DIAGNOSIS — J329 Chronic sinusitis, unspecified: Secondary | ICD-10-CM

## 2019-06-24 MED ORDER — PREDNISONE 10 MG (21) PO TBPK: ORAL_TABLET | ORAL | 0 refills | Status: DC

## 2019-06-24 MED ORDER — CLINDAMYCIN HCL 150 MG PO CAPS: 150.0000 mg | ORAL_CAPSULE | Freq: Three times a day (TID) | ORAL | 0 refills | Status: DC

## 2019-06-24 NOTE — Telephone Encounter (Signed)
Saw note from [place]. At this point recommend stop doxycycline and will rx clindamycin as well as refill prednisone.  But will also go ahead and refer him to ENT as this would be 3rd antibiotic. Hopefully they can see him quickly.  Remind him to use probiotics while using clindamycine.  Looks like he wanted a call back from me or Dr. Patsy Lager. I had chance to review and answer this close to 10 pm

## 2019-06-24 NOTE — Telephone Encounter (Signed)
Caller Name: Charles [name] Phone: [phone]  Pharmacy: [facility] - [place], [place] - [address] Phone:  [phone]  Fax:  [phone]     Pt called in with continued ear pain/pressure and sinus pain/pressure. He says while he is on the steriod and antibiotic he feels a little better but then once the course is complete it goes right back. Pt requesting follow up today from [facility] or Dr. Patsy Lager.

## 2019-06-24 NOTE — Addendum Note (Signed)
Addended by: Gwenevere Abbot on: 06/24/2019 09:48 PM   Modules accepted: Orders

## 2019-06-25 MED FILL — predniSONE 10 MG TABS: 10 | 6 days supply | Qty: 21 | Fill #0

## 2019-06-25 MED FILL — CLINDAMYCIN HCL 150 MG CAPS: 150 | 10 days supply | Qty: 30 | Fill #0

## 2019-06-25 NOTE — Telephone Encounter (Signed)
Dr. Patsy Lager sent patient message in my chart.

## 2019-06-26 NOTE — Patient Instructions (Addendum)
It was great to see you again today, I will be in touch with your labs soon as possible Please get your eyes examined at your convenience I think seeing ENT is a good idea at this time First shingles vaccine today- 2nd dose due in 2-6 months    Health Maintenance, Male Adopting a healthy lifestyle and getting preventive care are important in promoting health and wellness. Ask your health care provider about:  The right schedule for you to have regular tests and exams.  Things you can do on your own to prevent diseases and keep yourself healthy. What should I know about diet, weight, and exercise? Eat a healthy diet   Eat a diet that includes plenty of vegetables, fruits, low-fat dairy products, and lean protein.  Do not eat a lot of foods that are high in solid fats, added sugars, or sodium. Maintain a healthy weight Body mass index (BMI) is a measurement that can be used to identify possible weight problems. It estimates body fat based on height and weight. Your health care provider can help determine your BMI and help you achieve or maintain a healthy weight. Get regular exercise Get regular exercise. This is one of the most important things you can do for your health. Most adults should:  Exercise for at least 150 minutes each week. The exercise should increase your heart rate and make you sweat (moderate-intensity exercise).  Do strengthening exercises at least twice a week. This is in addition to the moderate-intensity exercise.  Spend less time sitting. Even light physical activity can be beneficial. Watch cholesterol and blood lipids Have your blood tested for lipids and cholesterol at 61 years of age, then have this test every 5 years. You may need to have your cholesterol levels checked more often if:  Your lipid or cholesterol levels are high.  You are older than 61 years of age.  You are at high risk for heart disease. What should I know about cancer screening? Many  types of cancers can be detected early and may often be prevented. Depending on your health history and family history, you may need to have cancer screening at various ages. This may include screening for:  Colorectal cancer.  Prostate cancer.  Skin cancer.  Lung cancer. What should I know about heart disease, diabetes, and high blood pressure? Blood pressure and heart disease  High blood pressure causes heart disease and increases the risk of stroke. This is more likely to develop in people who have high blood pressure readings, are of African descent, or are overweight.  Talk with your health care provider about your target blood pressure readings.  Have your blood pressure checked: ? Every 3-5 years if you are 67-36 years of age. ? Every year if you are 15 years old or older.  If you are between the ages of 45 and 88 and are a current or former smoker, ask your health care provider if you should have a one-time screening for abdominal aortic aneurysm (AAA). Diabetes Have regular diabetes screenings. This checks your fasting blood sugar level. Have the screening done:  Once every three years after age 66 if you are at a normal weight and have a low risk for diabetes.  More often and at a younger age if you are overweight or have a high risk for diabetes. What should I know about preventing infection? Hepatitis B If you have a higher risk for hepatitis B, you should be screened for this virus. Talk  with your health care provider to find out if you are at risk for hepatitis B infection. Hepatitis C Blood testing is recommended for:  Everyone born from [birth year] through 1967.  Anyone with known risk factors for hepatitis C. Sexually transmitted infections (STIs)  You should be screened each year for STIs, including gonorrhea and chlamydia, if: ? You are sexually active and are younger than 61 years of age. ? You are older than 61 years of age and your health care provider tells you  that you are at risk for this type of infection. ? Your sexual activity has changed since you were last screened, and you are at increased risk for chlamydia or gonorrhea. Ask your health care provider if you are at risk.  Ask your health care provider about whether you are at high risk for HIV. Your health care provider may recommend a prescription medicine to help prevent HIV infection. If you choose to take medicine to prevent HIV, you should first get tested for HIV. You should then be tested every 3 months for as long as you are taking the medicine. Follow these instructions at home: Lifestyle  Do not use any products that contain nicotine or tobacco, such as cigarettes, e-cigarettes, and chewing tobacco. If you need help quitting, ask your health care provider.  Do not use street drugs.  Do not share needles.  Ask your health care provider for help if you need support or information about quitting drugs. Alcohol use  Do not drink alcohol if your health care provider tells you not to drink.  If you drink alcohol: ? Limit how much you have to 0-2 drinks a day. ? Be aware of how much alcohol is in your drink. In the [place] one drink equals one 12 oz bottle of beer (355 mL), one 5 oz glass of wine (148 mL), or one 1 oz glass of hard liquor (44 mL). General instructions  Schedule regular health, dental, and eye exams.  Stay current with your vaccines.  Tell your health care provider if: ? You often feel depressed. ? You have ever been abused or do not feel safe at home. Summary  Adopting a healthy lifestyle and getting preventive care are important in promoting health and wellness.  Follow your health care provider's instructions about healthy diet, exercising, and getting tested or screened for diseases.  Follow your health care provider's instructions on monitoring your cholesterol and blood pressure. This information is not intended to replace advice given to you by your  health care provider. Make sure you discuss any questions you have with your health care provider. Document Revised: 05/06/2018 Document Reviewed: 05/06/2018 Elsevier Patient Education  2020 [employer].

## 2019-06-26 NOTE — Progress Notes (Addendum)
[place] at [facility] [address] [place], [place] [address] [phone]  Date:  06/28/2019   Name:  Charles [name]   DOB:  04-06-1961   MRN:  [id number]  PCP:  Darreld Mclean, MD    Chief Complaint: Annual Exam   History of Present Illness:  Charles [name] is a 59 y.o. very pleasant male patient who presents with the following:  Today for routine physical exam He had also recently contacted me with concern of recurrent/persistent sinusitis He started clindamycin this past Friday-this is I believe the third course of antibiotics he has used recently.  It does seem to be helping somewhat Percell Miller placed a referral to ENT for him- he has not scheduled this as of yet He was having some pressure in his ears Hearing is a bit dull but not terrible Also some pressure in his frontal sinuses around the eyes No pain in his eyes, ears, sinuses He is worse with activity and better with rest   History of HTN, CAD s/p stent over a decade ago, hyperlipidemia obesity, gout, sleep apnea, psoriasis  Last seen by myself in July 2019 following admission for urosepsis  Colon cancer screening 2015 Influenza shot done Can suggest Shingrix-he elects to start today  Recent lipid panel, otherwise can update labs He is due for an eye visit   Last visit with urology 2019-?  Has he followed up regarding PSA He has an appt in 2 weeks for his annual urology exam Dr Tresa Moore at [facility]  Would like me to check PSA today if possible  He checks his home blood pressure on a regular basis.  It trends between normal and mildly hypertensive, readings may range into the low 140s over 90s He has taken metoprolol  Lab Results  Component Value Date   PSA 4.29 (H) 09/22/2017   PSA 3.20 07/31/2016   PSA 3.02 06/13/2014   His cardiologist is Dr. Raliegh Ip, last visit with him in December 1. Atypical chest pain.  Not related to exercise different compared to the  pain that he got more than 10 years ago when he required angioplasty of the proximal LAD.  We had a long discussion about what to do with the situation will maintain him on present medications which include aspirin and statin, I will schedule him to have [place].  I will also give him prescription for nitroglycerin ask him to use it when he got the pain. 2. 2.  Dyslipidemia we will check his fasting lipid profile he will required intensification of his cholesterol management. 3. Because of fatigue tiredness as well as atypical chest pain I will schedule him to have an echocardiogram to assess left ventricle ejection fraction. 4. For now we will try to assess if he got any obstructive disease and then will continue discussion about risk factors modification for his coronary artery disease.  Overall we have already started this discussion with talk about exercising on the regular basis advised him not to push himself until we get stress test available.  He underwent a negative stress test on 04/07/2019  Nuclear stress EF: 59%. No wall motion abnormalities  There was no ST segment deviation noted during stress.  Defect 1: There is a small defect of mild severity present in the apical inferior location.  This is a low risk study. No ischemia identified.  Patient Active Problem List   Diagnosis Date Noted  . Sepsis secondary to UTI ([place])  11/17/2017  . UTI (urinary tract infection) 11/17/2017  . Left calcaneal bursitis 12/14/2014  . 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 Allergic reaction 10/14/2014  . Achilles tendinosis 10/05/2014  . OSA (obstructive sleep apnea) 08/18/2014  . Gout 08/14/2014  . Obesity 06/02/2013  . Abnormal MRI, spine 05/09/2013  . H/O urinary retention 05/09/2013  . Psoriasis 05/08/2012  . Coronary artery disease 08/03/2008  . Hyperlipidemia 11/10/2007  . History of gout 11/10/2007  . Essential hypertension 11/10/2007  . G E R D 01/06/2007    Past  Medical History:  Diagnosis Date  . BPH (benign prostatic hypertrophy) with urinary retention   . CAD (coronary artery disease) 04/2008   successful PCI of the lesion in the proximal LAD using xience drug-eluting stent with improvement in central narrowing from 95% to 0%.  Dr. Juanda Chance  . Foley catheter in place   . Gout    per pt stable is of 08-18-2014  . History of acute bronchitis    recently 07-16-2014  . History of sepsis    urosepsis--  08-13-2014 admission  . Hyperlipidemia LDL goal <100   . Hypertension   . Multilevel degenerative disc disease    cervical and lumbar  . OSA (obstructive sleep apnea)       . Psoriasis   . S/P drug eluting coronary stent placement    05-13-2008   DES X1 TO pLAD  . Sigmoid diverticulosis     Past Surgical History:  Procedure Laterality Date  . BUNIONECTOMY Right 2000  . COLONOSCOPY WITH PROPOFOL  last one 10-06-2013  . CORONARY ANGIOPLASTY WITH STENT PLACEMENT  05-13-2008    Dr Charlies Constable   PCI w/  DES x1 to  pLAD/  ef 65%  . GREEN LIGHT LASER TURP (TRANSURETHRAL RESECTION OF PROSTATE N/A 08/23/2014   Procedure: GREEN LIGHT LASER TURP (TRANSURETHRAL RESECTION OF PROSTATE;  Surgeon: Bjorn Pippin, MD;  Location: [facility];  Service: Urology;  Laterality: N/A;  . WRIST GANGLION EXCISION Left 2001    Social History   Tobacco Use  . Smoking status: Former Smoker    Packs/day: 0.75    Years: 14.00    Pack years: 10.50    Types: Cigarettes    Quit date: 05/27/1997    Years since quitting: 22.1  . Smokeless tobacco: Former Neurosurgeon    Types: Snuff    Quit date: 08/17/1997  Substance Use Topics  . Alcohol use: Yes    Comment: occasional  . Drug use: No    Family History  Problem Relation Age of Onset  . Hypertension Father   . Stroke Father 94  . Lung cancer Mother         smoker  . Aneurysm Mother   . Diabetes Sister   . Heart disease Brother 49        stent LAD 2012  . Colon cancer Neg Hx   . Pancreatic cancer Neg  Hx   . Rectal cancer Neg Hx   . Stomach cancer Neg Hx     Allergies  Allergen Reactions  . Lidocaine Anaphylaxis    REACTION: anaphylactic shock (also he was on Neomycin sulfate)  . Neomycin Sulfate Anaphylaxis    REACTION: anaphylactic shock (also on Lidocaine  topically)  . Sulfa Antibiotics Anaphylaxis  . Sulfa Drugs Cross Reactors Anaphylaxis  . Hctz [Hydrochlorothiazide] Other (See Comments)    Gout   . Tramadol Other (See Comments)    05/26/13 urinary retention  . Atrovent [  Ipratropium] Other (See Comments)    Caused Urinary retention (this is the nasal spray)  . Latex Itching and Rash    Skin irritation    Medication list has been reviewed and updated.  Current Outpatient Medications on File Prior to Visit  Medication Sig Dispense Refill  . allopurinol (ZYLOPRIM) 300 MG tablet TAKE 1 TABLET BY MOUTH EVERY DAY 90 tablet 0  . aspirin 81 MG tablet Take 1 tablet (81 mg total) by mouth daily. 90 tablet 1  . cetirizine (ZYRTEC) 10 MG tablet Take 10 mg by mouth at bedtime.    . clindamycin (CLEOCIN) 150 MG capsule Take 1 capsule (150 mg total) by mouth 3 (three) times daily. 30 capsule 0  . clobetasol cream (TEMOVATE) 0.05 % Apply 1 application topically 2 (two) times daily. Apply 1 application topically 2 (two) times daily as needed 60 g 3  . Coenzyme Q10 (CO Q 10 PO) Take 1 capsule by mouth every morning. 500mg     . finasteride (PROSCAR) 5 MG tablet Take 1 tablet (5 mg total) by mouth daily. 30 tablet 0  . fluocinonide (LIDEX) 0.05 % external solution Apply 1 application topically 2 (two) times daily. As needed for psoriasis 60 mL 2  . fluocinonide cream (LIDEX) 0.05 % Apply 1 application topically 2 (two) times daily. As needed for psoriasis 60 g 2  . FLUoxetine (PROZAC) 20 MG capsule TAKE 1 CAPSULE DAILY---  takes in am 30 capsule 0  . fluticasone (FLONASE) 50 MCG/ACT nasal spray Place 2 sprays into both nostrils daily. 16 g 1  . metoprolol succinate (TOPROL-XL) 100 MG 24 hr  tablet Take 1 tablet (100 mg total) by mouth daily. 90 tablet 3  . Multiple Vitamin (MULTIVITAMIN) capsule Take 1 capsule by mouth every morning.     . nitroGLYCERIN (NITROSTAT) 0.3 MG SL tablet Place 1 tablet (0.3 mg total) under the tongue every 5 (five) minutes as needed for chest pain. 25 tablet 2  . Omega-3 Fatty Acids (FISH OIL) 1000 MG CAPS Take 1,000 mg by mouth every morning.     . predniSONE (STERAPRED UNI-PAK 21 TAB) 10 MG (21) TBPK tablet Taper over 6 days. 21 tablet 0  . rosuvastatin (CRESTOR) 20 MG tablet Take 1 tablet (20 mg total) by mouth daily. 90 tablet 3  . tamsulosin (FLOMAX) 0.4 MG CAPS capsule Take 1 capsule (0.4 mg total) by mouth daily after supper. 30 capsule 0   No current facility-administered medications on file prior to visit.    Review of Systems:  As per HPI- otherwise negative.  No chest pain or shortness of breath Physical Examination: Vitals:   06/28/19 0842  BP: (!) 146/94  Pulse: 64  Resp: 18  Temp: (!) 96.3 F (35.7 C)  SpO2: 97%   Vitals:   06/28/19 0842  Weight: 260 lb (117.9 kg)  Height: 5\' 8"  (1.727 m)   Body mass index is 39.53 kg/m. Ideal Body Weight: Weight in (lb) to have BMI = 25: 164.1  GEN: WDWN, NAD, Non-toxic, A & O x 3, obese, looks well  HEENT: Atraumatic, Normocephalic. Neck supple. No masses, No LAD. Ears and Nose: No external deformity. CV: RRR, No M/G/R. No JVD. No thrill. No extra heart sounds. PULM: CTA B, no wheezes, crackles, rhonchi. No retractions. No resp. distress. No accessory muscle use. ABD: S, NT, ND, +BS. No rebound. No HSM. EXTR: No c/c/e NEURO Normal gait.  PSYCH: Normally interactive. Conversant. Not depressed or anxious appearing.  Calm demeanor.  BP Readings from Last 3 Encounters:  06/28/19 (!) 146/94  06/17/19 (!) 150/78  04/01/19 (!) 162/80    Assessment and Plan: Physical exam  Chronic sinusitis, unspecified location  Essential hypertension - Plan: CBC, Comprehensive metabolic  panel  Mixed hyperlipidemia  Increased prostate specific antigen (PSA) velocity - Plan: PSA  Benign prostatic hyperplasia with elevated prostate specific antigen (PSA) - Plan: PSA  Screening for diabetes mellitus - Plan: Hemoglobin A1c  History of gout - Plan: Uric acid, allopurinol (ZYLOPRIM) 300 MG tablet  Psoriasis - Plan: fluocinonide cream (LIDEX) 0.05 %, fluocinonide (LIDEX) 0.05 % external solution, clobetasol cream (TEMOVATE) 0.05 %  Mild depression (HCC) - Plan: FLUoxetine (PROZAC) 20 MG capsule  Immunization due - Plan: Varicella-zoster vaccine IM (Shingrix)  Following up for physical today Labs pending as above Filled medications for psoriasis Doing well with current dose of fluoxetine, refilled Gout has been under good control, check uric acid refill allopurinol blood Pressure is slightly high- for the time being he declines to increase his BP meds  He plans to work on weight loss and exercise Seeing urology next week ENT visit pending Moderate medical decision making This visit occurred during the SARS-CoV-2 public health emergency.  Safety protocols were in place, including screening questions prior to the visit, additional usage of staff PPE, and extensive cleaning of exam room while observing appropriate contact time as indicated for disinfecting solutions.    Signed Abbe Amsterdam, MD Received his labs, message to pt Stable pre-diabetes   Results for orders placed or performed in visit on 06/28/19  CBC  Result Value Ref Range   WBC 12.2 (H) 4.0 - 10.5 K/uL   RBC 5.05 4.22 - 5.81 Mil/uL   Platelets 261.0 150.0 - 400.0 K/uL   Hemoglobin 15.0 13.0 - 17.0 g/dL   HCT 42.7 06.2 - 37.6 %   MCV 90.3 78.0 - 100.0 fl   MCHC 33.0 30.0 - 36.0 g/dL   RDW 28.3 15.1 - 76.1 %  Comprehensive metabolic panel  Result Value Ref Range   Sodium 139 135 - 145 mEq/L   Potassium 4.3 3.5 - 5.1 mEq/L   Chloride 100 96 - 112 mEq/L   CO2 29 19 - 32 mEq/L   Glucose, Bld 80  70 - 99 mg/dL   BUN 22 6 - 23 mg/dL   Creatinine, Ser 6.07 0.40 - 1.50 mg/dL   Total Bilirubin 0.6 0.2 - 1.2 mg/dL   Alkaline Phosphatase 54 39 - 117 U/L   AST 18 0 - 37 U/L   ALT 24 0 - 53 U/L   Total Protein 6.9 6.0 - 8.3 g/dL   Albumin 4.3 3.5 - 5.2 g/dL   GFR 37.10 >62.69 mL/min   Calcium 9.2 8.4 - 10.5 mg/dL  Hemoglobin S8N  Result Value Ref Range   Hgb A1c MFr Bld 6.1 4.6 - 6.5 %  PSA  Result Value Ref Range   PSA 1.00 0.10 - 4.00 ng/mL  Uric acid  Result Value Ref Range   Uric Acid, Serum 6.0 4.0 - 7.8 mg/dL

## 2019-06-28 ENCOUNTER — Ambulatory Visit (INDEPENDENT_AMBULATORY_CARE_PROVIDER_SITE_OTHER): Payer: BC Managed Care – PPO | Admitting: Family Medicine

## 2019-06-28 ENCOUNTER — Encounter: Payer: Self-pay | Admitting: Family Medicine

## 2019-06-28 ENCOUNTER — Other Ambulatory Visit: Payer: Self-pay

## 2019-06-28 VITALS — BP 146/94 | HR 64 | Temp 96.3°F | Resp 18 | Ht 68.0 in | Wt 260.0 lb

## 2019-06-28 DIAGNOSIS — Z0000 Encounter for general adult medical examination without abnormal findings: Secondary | ICD-10-CM | POA: Diagnosis not present

## 2019-06-28 DIAGNOSIS — Z8739 Personal history of other diseases of the musculoskeletal system and connective tissue: Secondary | ICD-10-CM | POA: Diagnosis not present

## 2019-06-28 DIAGNOSIS — J329 Chronic sinusitis, unspecified: Secondary | ICD-10-CM

## 2019-06-28 DIAGNOSIS — Z131 Encounter for screening for diabetes mellitus: Secondary | ICD-10-CM | POA: Diagnosis not present

## 2019-06-28 DIAGNOSIS — F320 Major depressive disorder, single episode, mild: Secondary | ICD-10-CM

## 2019-06-28 DIAGNOSIS — N400 Enlarged prostate without lower urinary tract symptoms: Secondary | ICD-10-CM | POA: Diagnosis not present

## 2019-06-28 DIAGNOSIS — E782 Mixed hyperlipidemia: Secondary | ICD-10-CM | POA: Diagnosis not present

## 2019-06-28 DIAGNOSIS — E119 Type 2 diabetes mellitus without complications: Secondary | ICD-10-CM | POA: Insufficient documentation

## 2019-06-28 DIAGNOSIS — R7303 Prediabetes: Secondary | ICD-10-CM

## 2019-06-28 DIAGNOSIS — L409 Psoriasis, unspecified: Secondary | ICD-10-CM

## 2019-06-28 DIAGNOSIS — R972 Elevated prostate specific antigen [PSA]: Secondary | ICD-10-CM | POA: Diagnosis not present

## 2019-06-28 DIAGNOSIS — Z23 Encounter for immunization: Secondary | ICD-10-CM

## 2019-06-28 DIAGNOSIS — I10 Essential (primary) hypertension: Secondary | ICD-10-CM

## 2019-06-28 DIAGNOSIS — F32A Depression, unspecified: Secondary | ICD-10-CM

## 2019-06-28 LAB — COMPREHENSIVE METABOLIC PANEL
ALT: 24 U/L (ref 0–53)
AST: 18 U/L (ref 0–37)
Albumin: 4.3 g/dL (ref 3.5–5.2)
Alkaline Phosphatase: 54 U/L (ref 39–117)
BUN: 22 mg/dL (ref 6–23)
CO2: 29 mEq/L (ref 19–32)
Calcium: 9.2 mg/dL (ref 8.4–10.5)
Chloride: 100 mEq/L (ref 96–112)
Creatinine, Ser: 1.02 mg/dL (ref 0.40–1.50)
GFR: 74.36 mL/min (ref >60.00)
Glucose, Bld: 80 mg/dL (ref 70–99)
Potassium: 4.3 mEq/L (ref 3.5–5.1)
Sodium: 139 mEq/L (ref 135–145)
Total Bilirubin: 0.6 mg/dL (ref 0.2–1.2)
Total Protein: 6.9 g/dL (ref 6.0–8.3)

## 2019-06-28 LAB — CBC
HCT: 45.6 % (ref 39.0–52.0)
Hemoglobin: 15 g/dL (ref 13.0–17.0)
MCHC: 33 g/dL (ref 30.0–36.0)
MCV: 90.3 fl (ref 78.0–100.0)
Platelets: 261 10*3/uL (ref 150.0–400.0)
RBC: 5.05 Mil/uL (ref 4.22–5.81)
RDW: 13.8 % (ref 11.5–15.5)
WBC: 12.2 10*3/uL — ABNORMAL HIGH (ref 4.0–10.5)

## 2019-06-28 LAB — HEMOGLOBIN A1C: Hgb A1c MFr Bld: 6.1 % (ref 4.6–6.5)

## 2019-06-28 LAB — PSA: PSA: 1 ng/mL (ref 0.10–4.00)

## 2019-06-28 LAB — URIC ACID: Uric Acid, Serum: 6 mg/dL (ref 4.0–7.8)

## 2019-06-28 MED ORDER — FLUOCINONIDE 0.05 % EX CREA: 1.0000 "application " | TOPICAL_CREAM | Freq: Two times a day (BID) | CUTANEOUS | 2 refills | Status: DC

## 2019-06-28 MED ORDER — FLUOXETINE HCL 20 MG PO CAPS: ORAL_CAPSULE | ORAL | 3 refills | Status: DC

## 2019-06-28 MED ORDER — ALLOPURINOL 300 MG PO TABS: ORAL_TABLET | ORAL | 3 refills | Status: DC

## 2019-06-28 MED ORDER — CLOBETASOL PROPIONATE 0.05 % EX CREA: 1.0000 "application " | TOPICAL_CREAM | Freq: Two times a day (BID) | CUTANEOUS | 3 refills | Status: DC

## 2019-06-28 MED ORDER — FLUOCINONIDE 0.05 % EX SOLN: 1.0000 "application " | Freq: Two times a day (BID) | CUTANEOUS | 3 refills | Status: DC

## 2019-07-07 DIAGNOSIS — J329 Chronic sinusitis, unspecified: Secondary | ICD-10-CM | POA: Diagnosis not present

## 2019-07-07 DIAGNOSIS — G44219 Episodic tension-type headache, not intractable: Secondary | ICD-10-CM | POA: Diagnosis not present

## 2019-07-19 DIAGNOSIS — R972 Elevated prostate specific antigen [PSA]: Secondary | ICD-10-CM | POA: Diagnosis not present

## 2019-07-19 DIAGNOSIS — R310 Gross hematuria: Secondary | ICD-10-CM | POA: Diagnosis not present

## 2019-07-19 DIAGNOSIS — R339 Retention of urine, unspecified: Secondary | ICD-10-CM | POA: Diagnosis not present

## 2019-07-28 ENCOUNTER — Encounter: Payer: Self-pay | Admitting: Family Medicine

## 2019-07-29 ENCOUNTER — Ambulatory Visit: Payer: BC Managed Care – PPO | Attending: Internal Medicine

## 2019-07-29 DIAGNOSIS — Z20822 Contact with and (suspected) exposure to covid-19: Secondary | ICD-10-CM | POA: Diagnosis not present

## 2019-07-30 LAB — NOVEL CORONAVIRUS, NAA: SARS-CoV-2, NAA: NOT DETECTED

## 2019-08-01 ENCOUNTER — Encounter: Payer: Self-pay | Admitting: Family Medicine

## 2019-08-01 DIAGNOSIS — J329 Chronic sinusitis, unspecified: Secondary | ICD-10-CM

## 2019-08-02 ENCOUNTER — Encounter: Payer: Self-pay | Admitting: Family Medicine

## 2019-08-02 NOTE — Telephone Encounter (Signed)
Does patient need another appointment-seen in 06/2019

## 2019-08-03 ENCOUNTER — Emergency Department (HOSPITAL_BASED_OUTPATIENT_CLINIC_OR_DEPARTMENT_OTHER)
Admission: EM | Admit: 2019-08-03 | Discharge: 2019-08-04 | Disposition: A | Discharge status: Home / Self Care | Payer: BC Managed Care – PPO | Attending: Emergency Medicine | Admitting: Emergency Medicine

## 2019-08-03 ENCOUNTER — Other Ambulatory Visit: Payer: Self-pay

## 2019-08-03 ENCOUNTER — Encounter (HOSPITAL_BASED_OUTPATIENT_CLINIC_OR_DEPARTMENT_OTHER): Payer: Self-pay | Admitting: Oncology

## 2019-08-03 DIAGNOSIS — Z955 Presence of coronary angioplasty implant and graft: Secondary | ICD-10-CM | POA: Diagnosis not present

## 2019-08-03 DIAGNOSIS — Z91040 Latex allergy status: Secondary | ICD-10-CM | POA: Insufficient documentation

## 2019-08-03 DIAGNOSIS — Z87891 Personal history of nicotine dependence: Secondary | ICD-10-CM | POA: Insufficient documentation

## 2019-08-03 DIAGNOSIS — Z79899 Other long term (current) drug therapy: Secondary | ICD-10-CM | POA: Diagnosis not present

## 2019-08-03 DIAGNOSIS — I259 Chronic ischemic heart disease, unspecified: Secondary | ICD-10-CM | POA: Diagnosis not present

## 2019-08-03 DIAGNOSIS — I159 Secondary hypertension, unspecified: Secondary | ICD-10-CM

## 2019-08-03 DIAGNOSIS — Z7982 Long term (current) use of aspirin: Secondary | ICD-10-CM | POA: Insufficient documentation

## 2019-08-03 DIAGNOSIS — I10 Essential (primary) hypertension: Secondary | ICD-10-CM | POA: Diagnosis not present

## 2019-08-03 NOTE — Progress Notes (Addendum)
[facility] at [facility] [address] [place], [place] [address] [phone]  Date:  08/04/2019   Name:  Charles [name]   DOB:  Dec 25, 1960   MRN:  [id number]  PCP:  Pearline Cables, MD    Chief Complaint: Sinus Congestion (negative covid, ear pain, congestion) and Anxiety (headache, er last night, 179/104)   History of Present Illness:  Charles [name] is a 59 y.o. very pleasant male patient who presents with the following:  Here today for a follow-up visit Last seen by myself about a month ago for a CPE History of hyperlipidemia, hypertension  He has complained about sinus problems off and on for the last several months.  I ordered a CT of his sinuses but this has not been arranged yet We had him see ENT last month, they thought his issues were actually due more to headache or migraine syndrome He has had these sx for 3-4 months; started with pressure in his ears He was treated with abx 3x and also prednisone -this has not provided any long-lasting relief of symptoms  Start taking decongestants with phenylephrine last night, noted that his blood pressure went up and went to the ER yesterday evening.  He was evaluated and released  Yesterday he noted onset of a HA at 3 or 4 pm- he took some excedrin and an OTC decongestant.  He felt kind of strange and hot, his BP was 179/104, and hour later it was 159/97.  Right before bed he checked it again- was 161/104 so they went to the ER as above  He is taking toprol xl 100 mg daily Asa 81 Allopurinol fluoxetine crestor  Negative stress test in November 2020  Lab Results  Component Value Date   PSA 1.00 06/28/2019   PSA 4.29 (H) 09/22/2017   PSA 3.20 07/31/2016    He has not been going to work due to not feeling well.  He states that his employer understands, he would appreciate a work note however He notes that he is feeling very tired, he is also anxious  Notes that his  "extremities are anxious, like active and numb" He shows me a photo of his stool from this am which appears perhaps a bit yellow but normal  He notes that he has been taking some clonazepam which may be up to 61 years old.  He will occasionally feels like he is having a panic attack, and this does seem to help  He reports losing 8 lbs in a week due to not feeling like eating much/ decreased appetite Weight 251 at visit 04/2018 His weight actually appears to be stable as below  Wt Readings from Last 3 Encounters:  08/04/19 257 lb (116.6 kg)  08/03/19 252 lb (114.3 kg)  06/28/19 260 lb (117.9 kg)     Patient Active Problem List   Diagnosis Date Noted  . Pre-diabetes 06/28/2019  . Sepsis secondary to UTI ([facility]) 11/17/2017  . UTI (urinary tract infection) 11/17/2017  . Left calcaneal bursitis 12/14/2014  . 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 Allergic reaction 10/14/2014  . Achilles tendinosis 10/05/2014  . OSA (obstructive sleep apnea) 08/18/2014  . Gout 08/14/2014  . Obesity 06/02/2013  . Abnormal MRI, spine 05/09/2013  . H/O urinary retention 05/09/2013  . Psoriasis 05/08/2012  . Coronary artery disease 08/03/2008  . Hyperlipidemia 11/10/2007  . History of gout 11/10/2007  . Essential hypertension 11/10/2007  . G E  R D 01/06/2007    Past Medical History:  Diagnosis Date  . BPH (benign prostatic hypertrophy) with urinary retention   . CAD (coronary artery disease) 04/2008   successful PCI of the lesion in the proximal LAD using xience drug-eluting stent with improvement in central narrowing from 95% to 0%.  Dr. Olevia Perches  . Foley catheter in place   . Gout    per pt stable is of 08-18-2014  . History of acute bronchitis    recently 07-16-2014  . History of sepsis    urosepsis--  08-13-2014 admission  . Hyperlipidemia LDL goal <100   . Hypertension   . Multilevel degenerative disc disease    cervical and lumbar  . OSA (obstructive sleep apnea)       .  Psoriasis   . S/P drug eluting coronary stent placement    05-13-2008   DES X1 TO pLAD  . Sigmoid diverticulosis     Past Surgical History:  Procedure Laterality Date  . BUNIONECTOMY Right 2000  . COLONOSCOPY WITH PROPOFOL  last one 10-06-2013  . CORONARY ANGIOPLASTY WITH STENT PLACEMENT  05-13-2008    Dr Eustace Quail   PCI w/  DES x1 to  pLAD/  ef 65%  . GREEN LIGHT LASER TURP (TRANSURETHRAL RESECTION OF PROSTATE N/A 08/23/2014   Procedure: GREEN LIGHT LASER TURP (TRANSURETHRAL RESECTION OF PROSTATE;  Surgeon: Irine Seal, MD;  Location: [facility];  Service: Urology;  Laterality: N/A;  . WRIST GANGLION EXCISION Left 2001    Social History   Tobacco Use  . Smoking status: Former Smoker    Packs/day: 0.75    Years: 14.00    Pack years: 10.50    Types: Cigarettes    Quit date: 05/27/1997    Years since quitting: 22.2  . Smokeless tobacco: Former Systems developer    Types: Snuff    Quit date: 08/17/1997  Substance Use Topics  . Alcohol use: Yes    Comment: occasional  . Drug use: No    Family History  Problem Relation Age of Onset  . Hypertension Father   . Stroke Father 40  . Lung cancer Mother         smoker  . Aneurysm Mother   . Diabetes Sister   . Heart disease Brother 45        stent LAD 2012  . Colon cancer Neg Hx   . Pancreatic cancer Neg Hx   . Rectal cancer Neg Hx   . Stomach cancer Neg Hx     Allergies  Allergen Reactions  . Lidocaine Anaphylaxis    REACTION: anaphylactic shock (also he was on Neomycin sulfate)  . Neomycin Sulfate Anaphylaxis    REACTION: anaphylactic shock (also on Lidocaine  topically)  . Sulfa Antibiotics Anaphylaxis  . Sulfa Drugs Cross Reactors Anaphylaxis  . Hctz [Hydrochlorothiazide] Other (See Comments)    Gout   . Tramadol Other (See Comments)    05/26/13 urinary retention  . Atrovent [Ipratropium] Other (See Comments)    Caused Urinary retention (this is the nasal spray)  . Latex Itching and Rash    Skin irritation     Medication list has been reviewed and updated.  Current Outpatient Medications on File Prior to Visit  Medication Sig Dispense Refill  . allopurinol (ZYLOPRIM) 300 MG tablet TAKE 1 TABLET BY MOUTH EVERY DAY 90 tablet 3  . aspirin 81 MG tablet Take 1 tablet (81 mg total) by mouth daily. 90 tablet 1  . cetirizine (  ZYRTEC) 10 MG tablet Take 10 mg by mouth at bedtime.    . clindamycin (CLEOCIN) 150 MG capsule Take 1 capsule (150 mg total) by mouth 3 (three) times daily. 30 capsule 0  . clobetasol cream (TEMOVATE) 0.05 % Apply 1 application topically 2 (two) times daily. Apply 1 application topically 2 (two) times daily as needed. 300 gm is a 90 day supply 300 g 3  . Coenzyme Q10 (CO Q 10 PO) Take 1 capsule by mouth every morning. 500mg     . finasteride (PROSCAR) 5 MG tablet Take 1 tablet (5 mg total) by mouth daily. 30 tablet 0  . fluocinonide (LIDEX) 0.05 % external solution Apply 1 application topically 2 (two) times daily. As needed for psoriasis.  300 ml is a 90 day supply 300 mL 3  . fluocinonide cream (LIDEX) 0.05 % Apply 1 application topically 2 (two) times daily. As needed for psoriasis 60 g 2  . FLUoxetine (PROZAC) 20 MG capsule TAKE 1 CAPSULE DAILY---  takes in am 90 capsule 3  . fluticasone (FLONASE) 50 MCG/ACT nasal spray Place 2 sprays into both nostrils daily. 16 g 1  . metoprolol succinate (TOPROL-XL) 100 MG 24 hr tablet Take 1 tablet (100 mg total) by mouth daily. 90 tablet 3  . Multiple Vitamin (MULTIVITAMIN) capsule Take 1 capsule by mouth every morning.     . nitroGLYCERIN (NITROSTAT) 0.3 MG SL tablet Place 1 tablet (0.3 mg total) under the tongue every 5 (five) minutes as needed for chest pain. 25 tablet 2  . Omega-3 Fatty Acids (FISH OIL) 1000 MG CAPS Take 1,000 mg by mouth every morning.     . rosuvastatin (CRESTOR) 20 MG tablet Take 1 tablet (20 mg total) by mouth daily. 90 tablet 3  . tamsulosin (FLOMAX) 0.4 MG CAPS capsule Take 1 capsule (0.4 mg total) by mouth daily  after supper. 30 capsule 0   No current facility-administered medications on file prior to visit.    Review of Systems:  As per HPI- otherwise negative.   Physical Examination: Vitals:   08/04/19 1420  BP: 135/88  Pulse: 78  Resp: 18  Temp: (!) 96.8 F (36 C)  SpO2: 98%   Vitals:   08/04/19 1420  Weight: 257 lb (116.6 kg)  Height: 5\' 8"  (1.727 m)   Body mass index is 39.08 kg/m. Ideal Body Weight: Weight in (lb) to have BMI = 25: 164.1  GEN: no acute distress.  Obese, looks well but anxious  HEENT: Atraumatic, Normocephalic.  Bilateral TM wnl, oropharynx normal.  PEERL,EOMI.   Ears and Nose: No external deformity. CV: RRR, No M/G/R. No JVD. No thrill. No extra heart sounds. PULM: CTA B, no wheezes, crackles, rhonchi. No retractions. No resp. distress. No accessory muscle use. ABD: S, NT, ND, +BS. No rebound. No HSM.  Belly is benign EXTR: No c/c/e PSYCH: Normally interactive. Conversant.    Assessment and Plan: Vitamin D deficiency - Plan: Vitamin D (25 hydroxy)  Essential hypertension - Plan: Comprehensive metabolic panel, CBC  Mixed hyperlipidemia  Fatigue, unspecified type - Plan: TSH, Sedimentation rate, C-reactive protein  Extremity numbness - Plan: B12, Folate  Anxiety - Plan: clonazePAM (KLONOPIN) 0.5 MG tablet  Here today with symptoms of elevated blood pressure, sinus pain, and generalized malaise as well as headaches.  There also seems to be some element of anxiety Were able to get his CT scan of sinuses done today, hopefully this will be helpful Also obtain blood work as above Blood pressure  looks good by time of discharge I refilled his clonazepam to use sparingly as needed for anxiety Otherwise plan to follow-up with him as soon as we have any results, he will update me if any significant change in the meantime I did advise him to avoid NSAIDs and Excedrin for his headaches, as this may actually be making him worse.  I advised him to try Tylenol  instead, may also use a small amount of clonazepam if anxiety is associated Moderate medical decision making today This visit occurred during the SARS-CoV-2 public health emergency.  Safety protocols were in place, including screening questions prior to the visit, additional usage of staff PPE, and extensive cleaning of exam room while observing appropriate contact time as indicated for disinfecting solutions.    Signed Abbe Amsterdam, MD  Received his labs as follows-message to patient  Results for orders placed or performed in visit on 08/04/19  Comprehensive metabolic panel  Result Value Ref Range   Sodium 136 135 - 145 mEq/L   Potassium 4.2 3.5 - 5.1 mEq/L   Chloride 99 96 - 112 mEq/L   CO2 31 19 - 32 mEq/L   Glucose, Bld 116 (H) 70 - 99 mg/dL   BUN 16 6 - 23 mg/dL   Creatinine, Ser 0.25 0.40 - 1.50 mg/dL   Total Bilirubin 0.7 0.2 - 1.2 mg/dL   Alkaline Phosphatase 53 39 - 117 U/L   AST 29 0 - 37 U/L   ALT 38 0 - 53 U/L   Total Protein 7.7 6.0 - 8.3 g/dL   Albumin 4.5 3.5 - 5.2 g/dL   GFR 42.70 >62.37 mL/min   Calcium 9.7 8.4 - 10.5 mg/dL  TSH  Result Value Ref Range   TSH 0.83 0.35 - 4.50 uIU/mL  Sedimentation rate  Result Value Ref Range   Sed Rate 5 0 - 20 mm/hr  C-reactive protein  Result Value Ref Range   CRP <1.0 0.5 - 20.0 mg/dL  S28  Result Value Ref Range   Vitamin B-12 537 211 - 911 pg/mL  Vitamin D (25 hydroxy)  Result Value Ref Range   VITD 25.26 (L) 30.00 - 100.00 ng/mL  Folate  Result Value Ref Range   Folate >23.7 >5.9 ng/mL  CBC  Result Value Ref Range   WBC 7.9 4.0 - 10.5 K/uL   RBC 5.17 4.22 - 5.81 Mil/uL   Platelets 242.0 150.0 - 400.0 K/uL   Hemoglobin 15.6 13.0 - 17.0 g/dL   HCT 31.5 17.6 - 16.0 %   MCV 91.0 78.0 - 100.0 fl   MCHC 33.2 30.0 - 36.0 g/dL   RDW 73.7 10.6 - 26.9 %

## 2019-08-03 NOTE — ED Triage Notes (Signed)
Pt reports three weeks of intermittent HA pressure in nature.  Endorses some extremity tingling during that time.  States yesterday he checked his BP and it was 160/100.  Tonight at approximately 1800 pt developed HA again, took BP got a reading of 160/104, took two Excedrin 2 pseudoephedrine for nasal pressure.  At 1900 BP was 160/102.  Pt reports that he has been out of work since last week, tested for COVID on Thursday w/ a negative result.

## 2019-08-04 ENCOUNTER — Ambulatory Visit (HOSPITAL_BASED_OUTPATIENT_CLINIC_OR_DEPARTMENT_OTHER)
Admission: RE | Admit: 2019-08-04 | Discharge: 2019-08-04 | Disposition: A | Discharge status: Home / Self Care | Payer: BC Managed Care – PPO | Source: Ambulatory Visit | Attending: Family Medicine | Admitting: Family Medicine

## 2019-08-04 ENCOUNTER — Other Ambulatory Visit: Payer: Self-pay

## 2019-08-04 ENCOUNTER — Ambulatory Visit: Payer: BC Managed Care – PPO | Admitting: Family Medicine

## 2019-08-04 ENCOUNTER — Encounter: Payer: Self-pay | Admitting: Family Medicine

## 2019-08-04 VITALS — BP 135/88 | HR 78 | Temp 96.8°F | Resp 18 | Ht 68.0 in | Wt 257.0 lb

## 2019-08-04 DIAGNOSIS — Z955 Presence of coronary angioplasty implant and graft: Secondary | ICD-10-CM | POA: Diagnosis not present

## 2019-08-04 DIAGNOSIS — Z7982 Long term (current) use of aspirin: Secondary | ICD-10-CM | POA: Diagnosis not present

## 2019-08-04 DIAGNOSIS — E782 Mixed hyperlipidemia: Secondary | ICD-10-CM | POA: Diagnosis not present

## 2019-08-04 DIAGNOSIS — I259 Chronic ischemic heart disease, unspecified: Secondary | ICD-10-CM | POA: Diagnosis not present

## 2019-08-04 DIAGNOSIS — R5383 Other fatigue: Secondary | ICD-10-CM

## 2019-08-04 DIAGNOSIS — F419 Anxiety disorder, unspecified: Secondary | ICD-10-CM

## 2019-08-04 DIAGNOSIS — R200 Anesthesia of skin: Secondary | ICD-10-CM | POA: Diagnosis not present

## 2019-08-04 DIAGNOSIS — E559 Vitamin D deficiency, unspecified: Secondary | ICD-10-CM | POA: Diagnosis not present

## 2019-08-04 DIAGNOSIS — Z87891 Personal history of nicotine dependence: Secondary | ICD-10-CM | POA: Diagnosis not present

## 2019-08-04 DIAGNOSIS — I10 Essential (primary) hypertension: Secondary | ICD-10-CM

## 2019-08-04 DIAGNOSIS — J329 Chronic sinusitis, unspecified: Secondary | ICD-10-CM

## 2019-08-04 DIAGNOSIS — Z91040 Latex allergy status: Secondary | ICD-10-CM | POA: Diagnosis not present

## 2019-08-04 DIAGNOSIS — Z79899 Other long term (current) drug therapy: Secondary | ICD-10-CM | POA: Diagnosis not present

## 2019-08-04 DIAGNOSIS — J3489 Other specified disorders of nose and nasal sinuses: Secondary | ICD-10-CM | POA: Diagnosis not present

## 2019-08-04 LAB — COMPREHENSIVE METABOLIC PANEL
ALT: 38 U/L (ref 0–53)
AST: 29 U/L (ref 0–37)
Albumin: 4.5 g/dL (ref 3.5–5.2)
Alkaline Phosphatase: 53 U/L (ref 39–117)
BUN: 16 mg/dL (ref 6–23)
CO2: 31 mEq/L (ref 19–32)
Calcium: 9.7 mg/dL (ref 8.4–10.5)
Chloride: 99 mEq/L (ref 96–112)
Creatinine, Ser: 1.03 mg/dL (ref 0.40–1.50)
GFR: 73.51 mL/min (ref >60.00)
Glucose, Bld: 116 mg/dL — ABNORMAL HIGH (ref 70–99)
Potassium: 4.2 mEq/L (ref 3.5–5.1)
Sodium: 136 mEq/L (ref 135–145)
Total Bilirubin: 0.7 mg/dL (ref 0.2–1.2)
Total Protein: 7.7 g/dL (ref 6.0–8.3)

## 2019-08-04 LAB — VITAMIN D 25 HYDROXY (VIT D DEFICIENCY, FRACTURES): VITD: 25.26 ng/mL — ABNORMAL LOW (ref 30.00–100.00)

## 2019-08-04 LAB — TSH: TSH: 0.83 u[IU]/mL (ref 0.35–4.50)

## 2019-08-04 LAB — C-REACTIVE PROTEIN: CRP: <1 mg/dL (ref 0.5–20.0)

## 2019-08-04 LAB — FOLATE: Folate: >23.7 ng/mL (ref >5.9)

## 2019-08-04 LAB — CBC
HCT: 47 % (ref 39.0–52.0)
Hemoglobin: 15.6 g/dL (ref 13.0–17.0)
MCHC: 33.2 g/dL (ref 30.0–36.0)
MCV: 91 fl (ref 78.0–100.0)
Platelets: 242 10*3/uL (ref 150.0–400.0)
RBC: 5.17 Mil/uL (ref 4.22–5.81)
RDW: 13.8 % (ref 11.5–15.5)
WBC: 7.9 10*3/uL (ref 4.0–10.5)

## 2019-08-04 LAB — VITAMIN B12: Vitamin B-12: 537 pg/mL (ref 211–911)

## 2019-08-04 LAB — SEDIMENTATION RATE: Sed Rate: 5 mm/hr (ref 0–20)

## 2019-08-04 MED ORDER — CLONAZEPAM 0.5 MG PO TABS: 0.5000 mg | ORAL_TABLET | Freq: Two times a day (BID) | ORAL | 1 refills | Status: DC | PRN

## 2019-08-04 MED FILL — clonazePAM 0.5 MG TABS: 0.5 | 10 days supply | Qty: 20 | Fill #0

## 2019-08-04 NOTE — Patient Instructions (Addendum)
It was good to see you today- we will get some labs and CT of your sinuses today I will be in touch with your results asap We gave you some clonazepam to use as needed for anxiety or panic- use sparingly and do not drive on this medication   For headache, I would recommend relying on tylenol as much as you can.  A 1/2 clonazepam may also help for HA associated with anxiety or high blood pressure

## 2019-08-04 NOTE — Discharge Instructions (Addendum)
For nasal congestion use Coricidin HBP, which will not raise your blood pressure. Discuss this with your PCP.

## 2019-08-04 NOTE — ED Provider Notes (Signed)
[facility] EMERGENCY DEPARTMENT Provider Note  CSN: [id number] Arrival date & time: 08/03/19 2155  Chief Complaint(s) Hypertension  HPI Minh Roanhorse is a 59 y.o. male with a past medical history listed below including hypertension who presents to the emergency department with elevated blood pressure readings for 1 day.  Patient reports that he has been dealing with increased nasal congestion since December.  He has been worked up for [employer] and negative.  Patient has been on a course of clindamycin in January which is complete.  Patient began taking decongestants yesterday with phenylephrine.  Given the elevated blood pressure readings and not feeling well, he decided to present to the ED for evaluation.  He denied any associated chest pain or shortness of breath.  No nausea or vomiting.  No other physical complaints.  Patient already has an appointment with his PCP for tomorrow.  HPI  Past Medical History Past Medical History:  Diagnosis Date  . BPH (benign prostatic hypertrophy) with urinary retention   . CAD (coronary artery disease) 04/2008   successful PCI of the lesion in the proximal LAD using xience drug-eluting stent with improvement in central narrowing from 95% to 0%.  Dr. Juanda Chance  . Foley catheter in place   . Gout    per pt stable is of 08-18-2014  . History of acute bronchitis    recently 07-16-2014  . History of sepsis    urosepsis--  08-13-2014 admission  . Hyperlipidemia LDL goal <100   . Hypertension   . Multilevel degenerative disc disease    cervical and lumbar  . OSA (obstructive sleep apnea)       . Psoriasis   . S/P drug eluting coronary stent placement    05-13-2008   DES X1 TO pLAD  . Sigmoid diverticulosis    Patient Active Problem List   Diagnosis Date Noted  . Pre-diabetes 06/28/2019  . Sepsis secondary to UTI (HCC) 11/17/2017  . UTI (urinary tract infection) 11/17/2017  . Left calcaneal bursitis 12/14/2014  . Pronation deformity of  ankle, acquired 10/26/2014  . Allergic reaction 10/14/2014  . Achilles tendinosis 10/05/2014  . OSA (obstructive sleep apnea) 08/18/2014  . Gout 08/14/2014  . Obesity 06/02/2013  . Abnormal MRI, spine 05/09/2013  . H/O urinary retention 05/09/2013  . Psoriasis 05/08/2012  . Coronary artery disease 08/03/2008  . Hyperlipidemia 11/10/2007  . History of gout 11/10/2007  . Essential hypertension 11/10/2007  . G E R D 01/06/2007   Home Medication(s) Prior to Admission medications   Medication Sig Start Date End Date Taking? Authorizing Provider  allopurinol (ZYLOPRIM) 300 MG tablet TAKE 1 TABLET BY MOUTH EVERY DAY 06/28/19   Copland, Gwenlyn Found, MD  aspirin 81 MG tablet Take 1 tablet (81 mg total) by mouth daily. 04/30/19   Georgeanna Lea, MD  cetirizine (ZYRTEC) 10 MG tablet Take 10 mg by mouth at bedtime.    [provider]  clindamycin (CLEOCIN) 150 MG capsule Take 1 capsule (150 mg total) by mouth 3 (three) times daily. 06/24/19   Saguier, Ramon Dredge, PA-C  clobetasol cream (TEMOVATE) 0.05 % Apply 1 application topically 2 (two) times daily. Apply 1 application topically 2 (two) times daily as needed. 300 gm is a 90 day supply 06/28/19   Copland, Gwenlyn Found, MD  Coenzyme Q10 (CO Q 10 PO) Take 1 capsule by mouth every morning. 500mg     [provider]  finasteride (PROSCAR) 5 MG tablet Take 1 tablet (5 mg total) by mouth daily.  11/21/17   Darlin Drop, DO  fluocinonide (LIDEX) 0.05 % external solution Apply 1 application topically 2 (two) times daily. As needed for psoriasis.  300 ml is a 90 day supply 06/28/19   Copland, Gwenlyn Found, MD  fluocinonide cream (LIDEX) 0.05 % Apply 1 application topically 2 (two) times daily. As needed for psoriasis 06/28/19   Copland, Gwenlyn Found, MD  FLUoxetine (PROZAC) 20 MG capsule TAKE 1 CAPSULE DAILY---  takes in am 06/28/19   Copland, Gwenlyn Found, MD  fluticasone (FLONASE) 50 MCG/ACT nasal spray Place 2 sprays into both nostrils daily. 03/23/19    Saguier, Ramon Dredge, PA-C  metoprolol succinate (TOPROL-XL) 100 MG 24 hr tablet Take 1 tablet (100 mg total) by mouth daily. 04/30/19   Georgeanna Lea, MD  Multiple Vitamin (MULTIVITAMIN) capsule Take 1 capsule by mouth every morning.     [provider]  nitroGLYCERIN (NITROSTAT) 0.3 MG SL tablet Place 1 tablet (0.3 mg total) under the tongue every 5 (five) minutes as needed for chest pain. 04/01/19   Georgeanna Lea, MD  Omega-3 Fatty Acids (FISH OIL) 1000 MG CAPS Take 1,000 mg by mouth every morning.     [provider]  predniSONE (STERAPRED UNI-PAK 21 TAB) 10 MG (21) TBPK tablet Taper over 6 days. 06/24/19   Saguier, Ramon Dredge, PA-C  rosuvastatin (CRESTOR) 20 MG tablet Take 1 tablet (20 mg total) by mouth daily. 04/30/19   Georgeanna Lea, MD  tamsulosin (FLOMAX) 0.4 MG CAPS capsule Take 1 capsule (0.4 mg total) by mouth daily after supper. 11/20/17   Darlin Drop, DO                                                                                                                                    Past Surgical History Past Surgical History:  Procedure Laterality Date  . BUNIONECTOMY Right 2000  . COLONOSCOPY WITH PROPOFOL  last one 10-06-2013  . CORONARY ANGIOPLASTY WITH STENT PLACEMENT  05-13-2008    Dr Charlies Constable   PCI w/  DES x1 to  pLAD/  ef 65%  . GREEN LIGHT LASER TURP (TRANSURETHRAL RESECTION OF PROSTATE N/A 08/23/2014   Procedure: GREEN LIGHT LASER TURP (TRANSURETHRAL RESECTION OF PROSTATE;  Surgeon: Bjorn Pippin, MD;  Location: [facility];  Service: Urology;  Laterality: N/A;  . WRIST GANGLION EXCISION Left 2001   Family History Family History  Problem Relation Age of Onset  . Hypertension Father   . Stroke Father 59  . Lung cancer Mother         smoker  . Aneurysm Mother   . Diabetes Sister   . Heart disease Brother 49        stent LAD 2012  . Colon cancer Neg Hx   . Pancreatic cancer Neg Hx   . Rectal cancer Neg Hx   . Stomach  cancer Neg Hx     Social  History Social History   Tobacco Use  . Smoking status: Former Smoker    Packs/day: 0.75    Years: 14.00    Pack years: 10.50    Types: Cigarettes    Quit date: 05/27/1997    Years since quitting: 22.2  . Smokeless tobacco: Former Neurosurgeon    Types: Snuff    Quit date: 08/17/1997  Substance Use Topics  . Alcohol use: Yes    Comment: occasional  . Drug use: No   Allergies Lidocaine, Neomycin sulfate, Sulfa antibiotics, Sulfa drugs cross reactors, Hctz [hydrochlorothiazide], Tramadol, Atrovent [ipratropium], and Latex  Review of Systems Review of Systems All other systems are reviewed and are negative for acute change except as noted in the HPI  Physical Exam Vital Signs  I have reviewed the triage vital signs BP 132/80   Pulse (!) 58   Temp 98.7 F (37.1 C) (Oral)   Resp 20   Ht 5\' 8"  (1.727 m)   Wt 114.3 kg   SpO2 94%   BMI 38.32 kg/m   Physical Exam Vitals reviewed.  Constitutional:      General: He is not in acute distress.    Appearance: He is well-developed. He is not diaphoretic.  HENT:     Head: Normocephalic and atraumatic.     Right Ear: Tympanic membrane normal.     Left Ear: Tympanic membrane normal.     Nose: Congestion and rhinorrhea present.     Mouth/Throat:     Pharynx: Oropharynx is clear.  Eyes:     General: No scleral icterus.       Right eye: No discharge.        Left eye: No discharge.     Conjunctiva/sclera: Conjunctivae normal.     Pupils: Pupils are equal, round, and reactive to light.  Cardiovascular:     Rate and Rhythm: Normal rate and regular rhythm.     Heart sounds: No murmur. No friction rub. No gallop.   Pulmonary:     Effort: Pulmonary effort is normal. No respiratory distress.     Breath sounds: Normal breath sounds. No stridor. No rales.  Abdominal:     General: There is no distension.     Palpations: Abdomen is soft.     Tenderness: There is no abdominal tenderness.  Musculoskeletal:         General: No tenderness.     Cervical back: Normal range of motion and neck supple.     Right lower leg: No edema.     Left lower leg: No edema.  Skin:    General: Skin is warm and dry.     Findings: No erythema or rash.  Neurological:     Mental Status: He is alert and oriented to person, place, and time.     ED Results and Treatments Labs (all labs ordered are listed, but only abnormal results are displayed) Labs Reviewed - No data to display  EKG  EKG Interpretation  Date/Time:    Ventricular Rate:    PR Interval:    QRS Duration:   QT Interval:    QTC Calculation:   R Axis:     Text Interpretation:        Radiology No results found.  Pertinent labs & imaging results that were available during my care of the patient were reviewed by me and considered in my medical decision making (see chart for details).  Medications Ordered in ED Medications - No data to display                                                                                                                                  Procedures Procedures  (including critical care time)  Medical Decision Making / ED Course I have reviewed the nursing notes for this encounter and the patient's prior records (if available in EHR or on provided paperwork).   Tayvien Kane was evaluated in Emergency Department on 08/04/2019 for the symptoms described in the history of present illness. He was evaluated in the context of the global COVID-19 pandemic, which necessitated consideration that the patient might be at risk for infection with the SARS-CoV-2 virus that causes COVID-19. Institutional protocols and algorithms that pertain to the evaluation of patients at risk for COVID-19 are in a state of rapid change based on information released by regulatory bodies including the CDC and federal and state  organizations. These policies and algorithms were followed during the patient's care in the ED.  Asymptomatic hypertension.  Elevated readings correlate with the use of phenylephrine.  Recommended use of Coricidin HBP.  No need for labs or further work-up at this time.      Final Clinical Impression(s) / ED Diagnoses Final diagnoses:  None    The patient appears reasonably screened and/or stabilized for discharge and I doubt any other medical condition or other [facility] requiring further screening, evaluation, or treatment in the ED at this time prior to discharge. Safe for discharge with strict return precautions.  Disposition: Discharge  Condition: Good  I have discussed the results, Dx and Tx plan with the patient/family who expressed understanding and agree(s) with the plan. Discharge instructions discussed at length. The patient/family was given strict return precautions who verbalized understanding of the instructions. No further questions at time of discharge.    ED Discharge Orders    None      Follow Up: Copland, Gay Filler, MD [place] [address] [phone]  Today as scheduled     This chart was dictated using voice recognition software.  Despite best efforts to proofread,  errors can occur which can change the documentation meaning.   Fatima Blank, MD 08/04/19 Benancio Deeds

## 2019-08-05 ENCOUNTER — Encounter: Payer: Self-pay | Admitting: Family Medicine

## 2019-08-05 DIAGNOSIS — H938X3 Other specified disorders of ear, bilateral: Secondary | ICD-10-CM

## 2019-08-06 MED ORDER — PREDNISONE 20 MG PO TABS: ORAL_TABLET | ORAL | 0 refills | Status: DC

## 2019-08-08 ENCOUNTER — Encounter: Payer: Self-pay | Admitting: Family Medicine

## 2019-08-08 DIAGNOSIS — F320 Major depressive disorder, single episode, mild: Secondary | ICD-10-CM

## 2019-08-08 DIAGNOSIS — F32A Depression, unspecified: Secondary | ICD-10-CM

## 2019-08-16 NOTE — Telephone Encounter (Signed)
Let me know what you want to do regarding this patient seeign you. Virtual or in person? How soon should he see you?

## 2019-08-17 MED ORDER — FLUOXETINE HCL 40 MG PO CAPS: ORAL_CAPSULE | ORAL | 3 refills | Status: DC

## 2019-08-17 NOTE — Addendum Note (Signed)
Addended by: Abbe Amsterdam C on: 08/17/2019 01:27 PM   Modules accepted: Orders

## 2019-08-19 DIAGNOSIS — H9203 Otalgia, bilateral: Secondary | ICD-10-CM | POA: Diagnosis not present

## 2019-08-19 DIAGNOSIS — H903 Sensorineural hearing loss, bilateral: Secondary | ICD-10-CM | POA: Diagnosis not present

## 2019-08-19 MED FILL — METHOCARBAMOL 750 MG TABS: 750 | 20 days supply | Qty: 20 | Fill #0

## 2019-08-19 MED FILL — clonazePAM 0.5 MG TABS: 0.5 | 10 days supply | Qty: 20 | Fill #1

## 2019-08-30 ENCOUNTER — Encounter: Payer: Self-pay | Admitting: Family Medicine

## 2019-08-30 DIAGNOSIS — F320 Major depressive disorder, single episode, mild: Secondary | ICD-10-CM

## 2019-08-30 DIAGNOSIS — F32A Depression, unspecified: Secondary | ICD-10-CM

## 2019-08-30 MED ORDER — FLUOXETINE HCL 20 MG PO TABS: 20.0000 mg | ORAL_TABLET | Freq: Every day | ORAL | 3 refills | Status: DC

## 2019-08-30 NOTE — Telephone Encounter (Signed)
BP Readings from Last 3 Encounters:  08/04/19 135/88  08/04/19 (!) 152/90  06/28/19 (!) 146/94    Patient is on Prozac only, no other serotonergic drugs

## 2019-08-30 NOTE — Addendum Note (Signed)
Addended by: Abbe Amsterdam C on: 08/30/2019 02:56 PM   Modules accepted: Orders

## 2019-08-31 NOTE — Addendum Note (Signed)
Addended by: Abbe Amsterdam C on: 08/31/2019 12:55 PM   Modules accepted: Orders

## 2019-09-01 MED ORDER — PREDNISONE 20 MG PO TABS: ORAL_TABLET | ORAL | 0 refills | Status: DC

## 2019-09-01 NOTE — Addendum Note (Signed)
Addended by: Abbe Amsterdam C on: 09/01/2019 12:27 PM   Modules accepted: Orders

## 2019-09-06 ENCOUNTER — Encounter: Payer: Self-pay | Admitting: Family Medicine

## 2019-09-08 ENCOUNTER — Other Ambulatory Visit: Payer: Self-pay

## 2019-09-08 ENCOUNTER — Encounter: Payer: Self-pay | Admitting: Family Medicine

## 2019-09-08 ENCOUNTER — Ambulatory Visit: Payer: BC Managed Care – PPO | Admitting: Family Medicine

## 2019-09-08 VITALS — BP 142/90 | HR 74 | Temp 97.0°F | Resp 17 | Ht 68.0 in | Wt 251.0 lb

## 2019-09-08 DIAGNOSIS — M5030 Other cervical disc degeneration, unspecified cervical region: Secondary | ICD-10-CM

## 2019-09-08 DIAGNOSIS — Z23 Encounter for immunization: Secondary | ICD-10-CM | POA: Diagnosis not present

## 2019-09-08 MED ORDER — FLUOXETINE HCL 20 MG PO CAPS: 20.0000 mg | ORAL_CAPSULE | Freq: Every day | ORAL | 1 refills | Status: DC

## 2019-09-08 NOTE — Progress Notes (Signed)
[place] [facility] [address], [place], [place] [address] [phone]  Date:  09/08/2019   Name:  Charles [name]   DOB:  [birth year]   MRN:  [id number]  PCP:  Darreld Mclean, MD    Chief Complaint: Follow-up (swollen glands, congestion, worse in afternoon, trouble sleeping) and Intestinal Issue (discolored stool)   History of Present Illness:  Charles [name] is a 59 y.o. very pleasant male patient who presents with the following:  Follow-up visit today He has struggled over the last 3 months with recurrent apparent sinus pain/sinusitis/pain in his ears and jaw We did a CT of his sinuses on March 10 which was negative He was seen in the ER on March 9 with concern of elevated blood pressure  He has been to see 1 ENT provider, who thought his symptoms were more due to headache or migraine.  A second opinion with ENT is pending-  He had his hearing checked, he does have some high frequency hearing loss in both ears.  Basically ok otherwise, his ears have looked normal.  He is seeing Te[facility] in 10 days, they may do endoscopy  He sent me the following message on April 12 describing his most recent symptoms Hey Dr.Alexandros Ewan. I want to update you. I don't feel the prednisone helped much this time.  I have also seemed to have developed a swollen gland under my jaw yesterday. I have not had this prior to yesterday. It appears soft and not a hard lump and does not appear sensitive. What might this tell you. My past two stools have be green the second a little more brown.( I know this isn't your topic ). Is this medication related? I'm still priodically using clonazepam due to nerve or anxious feelings. I know I asked if the nerves on the back of my neck could be a problem as I do have diagnosed stenosis and protruding disks up high. I notice head tinkling and around ears when I lay in a certain position. I wonder if something is going on there  too. The day after a did hight tree trimming in November Is when I experienced the sever dizziness and that was the start of the pressure. I don't know, just thinking of everything. Could I have pinched something looking upward for a couple hours.   He notes pressure in both ears but left more than right.  This seems to radiate into his left neck  He may have some swelling of glands under his chin, not tender He has notes his mouth seems dry also  He continues to feel tired, low energy  He has noted some change in his bowel habits, but seems to be coming back to normal He had noted an "agitated" feeling in his bowels  He is eating less and has lost a few lbs per his report  Wt Readings from Last 3 Encounters:  09/08/19 251 lb (113.9 kg)  08/04/19 257 lb (116.6 kg)  08/03/19 252 lb (114.3 kg)    We did a limited autoimmune work-up for him last month- normal ESR and CRP  He notes that he falls asleep fine, but will be "shocked awake" by ear discomfort at 3 or 4 am.  He is able to get back to sleep generally   He did have an MRI of his C spine in 2014 which was abnl IMPRESSION: 1. 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 Right foraminal stenosis potentially affects the right C6 nerve. 2. C6-C7 broad-based left eccentric disc protrusion producing mild to moderate central stenosis with flattening of the left ventral cord. Left foraminal stenosis potentially affects the left C7 nerve. 3. Tiny right C3-C4 posterior lateral protrusion narrows the right neural foramen potentially affecting the right C4 nerve.  He went to trim some branches off some tall trees this past November- he had to do this work with his neck extended.   He felt fine, but then had a lot of dizziness the next day and the day after The vertigo resolved after this, but he has noted the issue with sinus pressure since that time   His hands and feet are not numb per se but do not feel  quite normal to him either.  Clonazepam seems to help He has not noted any weakness in his arms or relationship to positioning his arms No ST, no cough, no vomiting, no diarrhea  Former smoker and chewing tobacco user, he quit using all forms of tobacco in 1999  Patient Active Problem List   Diagnosis Date Noted  . Pre-diabetes 06/28/2019  . Sepsis secondary to UTI ([place]) 11/17/2017  . UTI (urinary tract infection) 11/17/2017  . Left calcaneal bursitis 12/14/2014  . 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 Allergic reaction 10/14/2014  . Achilles tendinosis 10/05/2014  . OSA (obstructive sleep apnea) 08/18/2014  . Gout 08/14/2014  . Obesity 06/02/2013  . Abnormal MRI, spine 05/09/2013  . H/O urinary retention 05/09/2013  . Psoriasis 05/08/2012  . Coronary artery disease 08/03/2008  . Hyperlipidemia 11/10/2007  . History of gout 11/10/2007  . Essential hypertension 11/10/2007  . G E R D 01/06/2007    Past Medical History:  Diagnosis Date  . BPH (benign prostatic hypertrophy) with urinary retention   . CAD (coronary artery disease) 04/2008   successful PCI of the lesion in the proximal LAD using xience drug-eluting stent with improvement in central narrowing from 95% to 0%.  Dr. Olevia Perches  . Foley catheter in place   . Gout    per pt stable is of 08-18-2014  . History of acute bronchitis    recently 07-16-2014  . History of sepsis    urosepsis--  08-13-2014 admission  . Hyperlipidemia LDL goal <100   . Hypertension   . Multilevel degenerative disc disease    cervical and lumbar  . OSA (obstructive sleep apnea)       . Psoriasis   . S/P drug eluting coronary stent placement    05-13-2008   DES X1 TO pLAD  . Sigmoid diverticulosis     Past Surgical History:  Procedure Laterality Date  . BUNIONECTOMY Right 2000  . COLONOSCOPY WITH PROPOFOL  last one 10-06-2013  . CORONARY ANGIOPLASTY WITH STENT PLACEMENT  05-13-2008    Dr Eustace Quail   PCI w/  DES x1 to   pLAD/  ef 65%  . GREEN LIGHT LASER TURP (TRANSURETHRAL RESECTION OF PROSTATE N/A 08/23/2014   Procedure: GREEN LIGHT LASER TURP (TRANSURETHRAL RESECTION OF PROSTATE;  Surgeon: Irine Seal, MD;  Location: [facility];  Service: Urology;  Laterality: N/A;  . WRIST GANGLION EXCISION Left 2001    Social History   Tobacco Use  . Smoking status: Former Smoker    Packs/day: 0.75    Years: 14.00    Pack years: 10.50    Types: Cigarettes    Quit date: 05/27/1997    Years since quitting: 22.2  .  Smokeless tobacco: Former Systems developer    Types: Snuff    Quit date: 08/17/1997  Substance Use Topics  . Alcohol use: Yes    Comment: occasional  . Drug use: No    Family History  Problem Relation Age of Onset  . Hypertension Father   . Stroke Father 80  . Lung cancer Mother         smoker  . Aneurysm Mother   . Diabetes Sister   . Heart disease Brother 64        stent LAD 2012  . Colon cancer Neg Hx   . Pancreatic cancer Neg Hx   . Rectal cancer Neg Hx   . Stomach cancer Neg Hx     Allergies  Allergen Reactions  . Lidocaine Anaphylaxis    REACTION: anaphylactic shock (also he was on Neomycin sulfate)  . Neomycin Sulfate Anaphylaxis    REACTION: anaphylactic shock (also on Lidocaine  topically)  . Sulfa Antibiotics Anaphylaxis  . Sulfa Drugs Cross Reactors Anaphylaxis  . Hctz [Hydrochlorothiazide] Other (See Comments)    Gout   . Tramadol Other (See Comments)    05/26/13 urinary retention  . Atrovent [Ipratropium] Other (See Comments)    Caused Urinary retention (this is the nasal spray)  . Latex Itching and Rash    Skin irritation    Medication list has been reviewed and updated.  Current Outpatient Medications on File Prior to Visit  Medication Sig Dispense Refill  . allopurinol (ZYLOPRIM) 300 MG tablet TAKE 1 TABLET BY MOUTH EVERY DAY 90 tablet 3  . aspirin 81 MG tablet Take 1 tablet (81 mg total) by mouth daily. 90 tablet 1  . cetirizine (ZYRTEC) 10 MG tablet Take  10 mg by mouth at bedtime.    . clobetasol cream (TEMOVATE) 7.51 % Apply 1 application topically 2 (two) times daily. Apply 1 application topically 2 (two) times daily as needed. 300 gm is a 90 day supply 300 g 3  . clonazePAM (KLONOPIN) 0.5 MG tablet Take 1 tablet (0.5 mg total) by mouth 2 (two) times daily as needed for anxiety. 20 tablet 1  . Coenzyme Q10 (CO Q 10 PO) Take 1 capsule by mouth every morning. 518m    . finasteride (PROSCAR) 5 MG tablet Take 1 tablet (5 mg total) by mouth daily. 30 tablet 0  . fluocinonide (LIDEX) 0.05 % external solution Apply 1 application topically 2 (two) times daily. As needed for psoriasis.  300 ml is a 90 day supply 300 mL 3  . fluocinonide cream (LIDEX) 07.00% Apply 1 application topically 2 (two) times daily. As needed for psoriasis 60 g 2  . FLUoxetine (PROZAC) 20 MG tablet Take 1 tablet (20 mg total) by mouth daily. 90 tablet 3  . fluticasone (FLONASE) 50 MCG/ACT nasal spray Place 2 sprays into both nostrils daily. 16 g 1  . metoprolol succinate (TOPROL-XL) 100 MG 24 hr tablet Take 1 tablet (100 mg total) by mouth daily. 90 tablet 3  . Multiple Vitamin (MULTIVITAMIN) capsule Take 1 capsule by mouth every morning.     . nitroGLYCERIN (NITROSTAT) 0.3 MG SL tablet Place 1 tablet (0.3 mg total) under the tongue every 5 (five) minutes as needed for chest pain. 25 tablet 2  . Omega-3 Fatty Acids (FISH OIL) 1000 MG CAPS Take 1,000 mg by mouth every morning.     . predniSONE (DELTASONE) 20 MG tablet Take 2 pills daily for 3 days, then 1 pill daily for 3 days 9  tablet 0  . rosuvastatin (CRESTOR) 20 MG tablet Take 1 tablet (20 mg total) by mouth daily. 90 tablet 3  . tamsulosin (FLOMAX) 0.4 MG CAPS capsule Take 1 capsule (0.4 mg total) by mouth daily after supper. 30 capsule 0   No current facility-administered medications on file prior to visit.    Review of Systems:  As per HPI- otherwise negative.   Physical Examination: Vitals:   09/08/19 1345  09/08/19 1414  BP: (!) 147/95 (!) 142/90  Pulse: 74   Resp: 17   Temp: (!) 97 F (36.1 C)   SpO2: 97%    Vitals:   09/08/19 1345  Weight: 251 lb (113.9 kg)  Height: 5' 8"  (1.727 m)   Body mass index is 38.16 kg/m. Ideal Body Weight: Weight in (lb) to have BMI = 25: 164.1  GEN: no acute distress.  Obese, otherwise looks well HEENT: Atraumatic, Normocephalic.   Bilateral TM wnl, oropharynx normal.  PEERL,EOMI. no abnormal lymphadenopathy is noted, no tooth pain, no oral lesions, no enlarged lymph nodes or salivary glands are noted Ears and Nose: No external deformity. CV: RRR, No M/G/R. No JVD. No thrill. No extra heart sounds. PULM: CTA B, no wheezes, crackles, rhonchi. No retractions. No resp. distress. No accessory muscle use. ABD: S, NT, ND, +BS. No rebound. No HSM. EXTR: No c/c/e PSYCH: Normally interactive. Conversant.  Normal strength, sensation, DTR of all extremities.  Normal facial motion and sensation   Assessment and Plan: Degenerative disc disease, cervical - Plan: MR Cervical Spine Wo Contrast  Immunization due - Plan: Varicella-zoster vaccine IM (Shingrix)  Here today with difficult to diagnose pain and pressure in his sinuses and ears.  This has been going on for some weeks, the patient is actually currently out of work.  He plans to apply for short-term disability.  He is seeing ENT for second opinion next week.  I encouraged him to also have a check with his dentist to make sure there is not a tooth problem  We will order an MRI of his cervical spine-he has known history of degenerative disc disease in his cervical spine, unusual symptoms could be related to worsening of his spine disease  Due for second Shingrix, given today  This visit occurred during the SARS-CoV-2 public health emergency.  Safety protocols were in place, including screening questions prior to the visit, additional usage of staff PPE, and extensive cleaning of exam room while observing  appropriate contact time as indicated for disinfecting solutions.   Moderate medical decision making today  Signed Lamar Blinks, MD

## 2019-09-08 NOTE — Patient Instructions (Addendum)
We will get you set up for an MRI of your cervical spine Please contact your dentist asap and schedule an appt  Also please let me know what Dr Suszanne Conners has to say Let me know if any change or worsening of your symptoms

## 2019-09-17 DIAGNOSIS — K219 Gastro-esophageal reflux disease without esophagitis: Secondary | ICD-10-CM | POA: Diagnosis not present

## 2019-09-17 DIAGNOSIS — R070 Pain in throat: Secondary | ICD-10-CM | POA: Diagnosis not present

## 2019-09-17 DIAGNOSIS — H9202 Otalgia, left ear: Secondary | ICD-10-CM | POA: Diagnosis not present

## 2019-09-17 DIAGNOSIS — H903 Sensorineural hearing loss, bilateral: Secondary | ICD-10-CM | POA: Diagnosis not present

## 2019-11-19 DIAGNOSIS — K219 Gastro-esophageal reflux disease without esophagitis: Secondary | ICD-10-CM | POA: Diagnosis not present

## 2019-11-19 DIAGNOSIS — R070 Pain in throat: Secondary | ICD-10-CM | POA: Diagnosis not present

## 2020-01-04 ENCOUNTER — Encounter: Payer: Self-pay | Admitting: Family Medicine

## 2020-01-27 ENCOUNTER — Encounter: Payer: Self-pay | Admitting: Family Medicine

## 2020-01-27 ENCOUNTER — Telehealth: Payer: Self-pay | Admitting: Cardiology

## 2020-01-27 ENCOUNTER — Other Ambulatory Visit: Payer: Self-pay | Admitting: Family Medicine

## 2020-01-27 DIAGNOSIS — Z111 Encounter for screening for respiratory tuberculosis: Secondary | ICD-10-CM

## 2020-01-27 NOTE — Telephone Encounter (Signed)
Marisue Ivan from [employer] calling in regards to myocardial perfusion denied in December. She states the patient is being billed $6,000 for the test, because it was denied for being not medically necessary. She would like someone to contact the patient about the next steps he can take for an external appeal.

## 2020-01-28 NOTE — Telephone Encounter (Signed)
Called patient. He explained that he needs [place] to figure out how to start appeal. Will contact insurance company.

## 2020-01-28 NOTE — Telephone Encounter (Signed)
Follow up:     Patient returning a call and his phone is not ringing please call [phone]

## 2020-01-28 NOTE — Telephone Encounter (Signed)
° ° °  Pt is returning call from [facility]

## 2020-01-28 NOTE — Telephone Encounter (Signed)
Left message for patient to return call.

## 2020-01-28 NOTE — Progress Notes (Signed)
Sent patient mychart message

## 2020-01-28 NOTE — Telephone Encounter (Signed)
Patient is returning call.  °

## 2020-01-28 NOTE — Telephone Encounter (Signed)
Left message to inform patient that if he wants to start an appeal he needs to check with insurance company they can give him the info on what to do. Advised him to call with any questions.

## 2020-02-01 ENCOUNTER — Other Ambulatory Visit: Payer: Self-pay

## 2020-02-01 ENCOUNTER — Ambulatory Visit: Payer: BC Managed Care – PPO

## 2020-02-01 ENCOUNTER — Other Ambulatory Visit: Payer: BC Managed Care – PPO

## 2020-02-01 DIAGNOSIS — Z111 Encounter for screening for respiratory tuberculosis: Secondary | ICD-10-CM

## 2020-02-02 NOTE — Telephone Encounter (Signed)
Received form, filled it out waiting for Dr. Bing Matter to fill out then will have patient sign then will send off.

## 2020-02-02 NOTE — Telephone Encounter (Signed)
Called [employer] to figure out what needs to be done for an appeal. After being on the phone for over 35 minutes. They will fax external appeal form to [place].

## 2020-02-03 LAB — QUANTIFERON-TB GOLD PLUS
Mitogen-NIL: >10 IU/mL
NIL: 0.03 IU/mL
QuantiFERON-TB Gold Plus: NEGATIVE
TB1-NIL: 0 IU/mL
TB2-NIL: 0 IU/mL

## 2020-02-03 NOTE — Telephone Encounter (Signed)
Left message for patient to return call.

## 2020-02-08 NOTE — Telephone Encounter (Signed)
Called patient back, informed him we have a form for him to sign he would like this sent to [place] office will fax there.

## 2020-02-08 NOTE — Telephone Encounter (Signed)
Patient is returning call.  °

## 2020-02-08 NOTE — Telephone Encounter (Signed)
Left message for patient to return call.

## 2020-02-13 ENCOUNTER — Other Ambulatory Visit: Payer: Self-pay | Admitting: Family Medicine

## 2020-03-09 ENCOUNTER — Other Ambulatory Visit: Payer: Self-pay | Admitting: Cardiology

## 2020-03-09 DIAGNOSIS — E782 Mixed hyperlipidemia: Secondary | ICD-10-CM

## 2020-04-17 NOTE — Telephone Encounter (Signed)
Patient's wife calling in to say that [facility] is stating they never received paperwork regarding this issue. She would like to either pick up a completed copy or have someone email her a copy. Patient is getting collection calls now in regards to this. Please call/advise  Thank you!

## 2020-04-18 ENCOUNTER — Telehealth: Payer: Self-pay | Admitting: Cardiology

## 2020-04-18 NOTE — Telephone Encounter (Signed)
I have already spoke to patients wife please see previous encounter.

## 2020-04-18 NOTE — Telephone Encounter (Signed)
Patient is returning phone call.  °

## 2020-04-18 NOTE — Telephone Encounter (Signed)
Called patient wife per dpr. Informed her we will resend paperwork again. She verbally understood no further questions.

## 2020-06-06 ENCOUNTER — Encounter: Payer: Self-pay | Admitting: Family Medicine

## 2020-06-06 ENCOUNTER — Telehealth: Payer: Self-pay

## 2020-06-06 DIAGNOSIS — Z125 Encounter for screening for malignant neoplasm of prostate: Secondary | ICD-10-CM

## 2020-06-06 DIAGNOSIS — Z8739 Personal history of other diseases of the musculoskeletal system and connective tissue: Secondary | ICD-10-CM

## 2020-06-06 DIAGNOSIS — I10 Essential (primary) hypertension: Secondary | ICD-10-CM

## 2020-06-06 DIAGNOSIS — Z8639 Personal history of other endocrine, nutritional and metabolic disease: Secondary | ICD-10-CM

## 2020-06-06 DIAGNOSIS — E782 Mixed hyperlipidemia: Secondary | ICD-10-CM

## 2020-06-06 DIAGNOSIS — R7303 Prediabetes: Secondary | ICD-10-CM

## 2020-06-06 DIAGNOSIS — Z20822 Contact with and (suspected) exposure to covid-19: Secondary | ICD-10-CM

## 2020-06-06 DIAGNOSIS — E559 Vitamin D deficiency, unspecified: Secondary | ICD-10-CM

## 2020-06-06 MED ORDER — METOPROLOL SUCCINATE ER 100 MG PO TB24: 100.0000 mg | ORAL_TABLET | Freq: Every day | ORAL | 0 refills | Status: DC

## 2020-06-06 NOTE — Telephone Encounter (Signed)
Good morning. Just curious. Seeing as how my appt with Dr. Patsy Lager is at 11am on 2/14, I usually have blood work done. Is it possible to have blood work done prior to [employer]. that morning ? That way its done and I can fast for the lipid panel. I also would like a covid antibody test performed as well to see the level of antibodies I have. Also, can DrCopland put in a 90day refill to [facility] on my metoprolol 100mg  to hold me over till the appt.     Thank you   See above message from patient regarding lab work. I have refilled his medication until appt.

## 2020-06-06 NOTE — Addendum Note (Signed)
Addended by: Abbe Amsterdam C on: 06/06/2020 07:09 PM   Modules accepted: Orders

## 2020-06-12 ENCOUNTER — Encounter: Payer: BC Managed Care – PPO | Admitting: Family Medicine

## 2020-06-15 ENCOUNTER — Telehealth (INDEPENDENT_AMBULATORY_CARE_PROVIDER_SITE_OTHER): Payer: BC Managed Care – PPO | Admitting: Family Medicine

## 2020-06-15 ENCOUNTER — Other Ambulatory Visit: Payer: Self-pay

## 2020-06-15 DIAGNOSIS — R5383 Other fatigue: Secondary | ICD-10-CM | POA: Diagnosis not present

## 2020-06-15 DIAGNOSIS — M791 Myalgia, unspecified site: Secondary | ICD-10-CM | POA: Diagnosis not present

## 2020-06-15 DIAGNOSIS — R059 Cough, unspecified: Secondary | ICD-10-CM | POA: Diagnosis not present

## 2020-06-15 DIAGNOSIS — Z20822 Contact with and (suspected) exposure to covid-19: Secondary | ICD-10-CM

## 2020-06-15 DIAGNOSIS — U071 COVID-19: Secondary | ICD-10-CM

## 2020-06-15 NOTE — Progress Notes (Signed)
[facility] [address], [address] [place], [place] [address] [phone]  Date:  06/15/2020   Name:  Charles [name]   DOB:  06-18-1960   MRN:  [id number]  PCP:  Pearline Cables, MD    Chief Complaint: No chief complaint on file.   History of Present Illness:  Charles [name] is a 60 y.o. very pleasant male patient who presents with the following:  Virtual visit today for concern of illness.  Patient location is home, provider location is office.  Patient identity confirmed with 2 factors, he gives consent for virtual visit today Connected with patient via video  History of prediabetes, sleep apnea, obesity hyperlipidemia, CAD, hypertension  Last seen by myself in April of this year  Pt notes that last week he traveled for work - he was in [facility] for 4 days By Thursday- a week ago today- he started to get sick.  Today is also Thursday  He noticed sneezing, fatigue, felt warm Friday he got worse- temp to 99/ 100 Cough and congestion occurred on Saturday He took a home covid test on Friday negative No ST He has felt tried, sleeping more, body aches His wife got sick 2 days ago; she was positive on tuesday Yesterday temp seemed to go away, aches are improved  However he is still not well enough to go back to work No diarrhea, no vomiting  COVID series- booster not due yet  Flu vaccine- done   He is using cough drops, the cough is not a huge symptom He does not yet feel ready to return to work, needs documentation for my office  Patient Active Problem List   Diagnosis Date Noted  . Pre-diabetes 06/28/2019  . Sepsis secondary to UTI (HCC) 11/17/2017  . UTI (urinary tract infection) 11/17/2017  . Left calcaneal bursitis 12/14/2014  . 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 Allergic reaction 10/14/2014  . Achilles tendinosis 10/05/2014  . OSA (obstructive sleep apnea) 08/18/2014  . Gout 08/14/2014  .  Obesity 06/02/2013  . Abnormal MRI, spine 05/09/2013  . H/O urinary retention 05/09/2013  . Psoriasis 05/08/2012  . Coronary artery disease 08/03/2008  . Hyperlipidemia 11/10/2007  . History of gout 11/10/2007  . Essential hypertension 11/10/2007  . G E R D 01/06/2007    Past Medical History:  Diagnosis Date  . BPH (benign prostatic hypertrophy) with urinary retention   . CAD (coronary artery disease) 04/2008   successful PCI of the lesion in the proximal LAD using xience drug-eluting stent with improvement in central narrowing from 95% to 0%.  Dr. Juanda Chance  . Foley catheter in place   . Gout    per pt stable is of 08-18-2014  . History of acute bronchitis    recently 07-16-2014  . History of sepsis    urosepsis--  08-13-2014 admission  . Hyperlipidemia LDL goal <100   . Hypertension   . Multilevel degenerative disc disease    cervical and lumbar  . OSA (obstructive sleep apnea)       . Psoriasis   . S/P drug eluting coronary stent placement    05-13-2008   DES X1 TO pLAD  . Sigmoid diverticulosis     Past Surgical History:  Procedure Laterality Date  . BUNIONECTOMY Right 2000  . COLONOSCOPY WITH PROPOFOL  last one 10-06-2013  . CORONARY ANGIOPLASTY WITH STENT PLACEMENT  05-13-2008    Dr Charlies Constable   PCI  w/  DES x1 to  pLAD/  ef 65%  . GREEN LIGHT LASER TURP (TRANSURETHRAL RESECTION OF PROSTATE N/A 08/23/2014   Procedure: GREEN LIGHT LASER TURP (TRANSURETHRAL RESECTION OF PROSTATE;  Surgeon: Bjorn Pippin, MD;  Location: [facility];  Service: Urology;  Laterality: N/A;  . WRIST GANGLION EXCISION Left 2001    Social History   Tobacco Use  . Smoking status: Former Smoker    Packs/day: 0.75    Years: 14.00    Pack years: 10.50    Types: Cigarettes    Quit date: 05/27/1997    Years since quitting: 23.0  . Smokeless tobacco: Former Neurosurgeon    Types: Snuff    Quit date: 08/17/1997  Vaping Use  . Vaping Use: Never used  Substance Use Topics  . Alcohol use:  Yes    Comment: occasional  . Drug use: No    Family History  Problem Relation Age of Onset  . Hypertension Father   . Stroke Father 104  . Lung cancer Mother         smoker  . Aneurysm Mother   . Diabetes Sister   . Heart disease Brother 49        stent LAD 2012  . Colon cancer Neg Hx   . Pancreatic cancer Neg Hx   . Rectal cancer Neg Hx   . Stomach cancer Neg Hx     Allergies  Allergen Reactions  . Lidocaine Anaphylaxis    REACTION: anaphylactic shock (also he was on Neomycin sulfate)  . Neomycin Sulfate Anaphylaxis    REACTION: anaphylactic shock (also on Lidocaine  topically)  . Sulfa Antibiotics Anaphylaxis  . Sulfa Drugs Cross Reactors Anaphylaxis  . Hctz [Hydrochlorothiazide] Other (See Comments)    Gout   . Tramadol Other (See Comments)    05/26/13 urinary retention  . Atrovent [Ipratropium] Other (See Comments)    Caused Urinary retention (this is the nasal spray)  . Latex Itching and Rash    Skin irritation    Medication list has been reviewed and updated.  Current Outpatient Medications on File Prior to Visit  Medication Sig Dispense Refill  . allopurinol (ZYLOPRIM) 300 MG tablet TAKE 1 TABLET BY MOUTH EVERY DAY 90 tablet 3  . aspirin 81 MG tablet Take 1 tablet (81 mg total) by mouth daily. 90 tablet 1  . cetirizine (ZYRTEC) 10 MG tablet Take 10 mg by mouth at bedtime.    . clobetasol cream (TEMOVATE) 0.05 % Apply 1 application topically 2 (two) times daily. Apply 1 application topically 2 (two) times daily as needed. 300 gm is a 90 day supply 300 g 3  . clonazePAM (KLONOPIN) 0.5 MG tablet Take 1 tablet (0.5 mg total) by mouth 2 (two) times daily as needed for anxiety. 20 tablet 1  . Coenzyme Q10 (CO Q 10 PO) Take 1 capsule by mouth every morning. 500mg     . finasteride (PROSCAR) 5 MG tablet Take 1 tablet (5 mg total) by mouth daily. 30 tablet 0  . fluocinonide (LIDEX) 0.05 % external solution Apply 1 application topically 2 (two) times daily. As needed for  psoriasis.  300 ml is a 90 day supply 300 mL 3  . fluocinonide cream (LIDEX) 0.05 % Apply 1 application topically 2 (two) times daily. As needed for psoriasis 60 g 2  . FLUoxetine (PROZAC) 20 MG capsule TAKE 1 CAPSULE BY MOUTH  DAILY 90 capsule 3  . FLUoxetine (PROZAC) 20 MG tablet Take 1 tablet (20  mg total) by mouth daily. 90 tablet 3  . fluticasone (FLONASE) 50 MCG/ACT nasal spray Place 2 sprays into both nostrils daily. 16 g 1  . metoprolol succinate (TOPROL-XL) 100 MG 24 hr tablet Take 1 tablet (100 mg total) by mouth daily. 90 tablet 0  . Multiple Vitamin (MULTIVITAMIN) capsule Take 1 capsule by mouth every morning.     . nitroGLYCERIN (NITROSTAT) 0.3 MG SL tablet Place 1 tablet (0.3 mg total) under the tongue every 5 (five) minutes as needed for chest pain. 25 tablet 2  . Omega-3 Fatty Acids (FISH OIL) 1000 MG CAPS Take 1,000 mg by mouth every morning.     . predniSONE (DELTASONE) 20 MG tablet Take 2 pills daily for 3 days, then 1 pill daily for 3 days 9 tablet 0  . rosuvastatin (CRESTOR) 20 MG tablet TAKE 1 TABLET BY MOUTH  DAILY 90 tablet 3  . tamsulosin (FLOMAX) 0.4 MG CAPS capsule Take 1 capsule (0.4 mg total) by mouth daily after supper. 30 capsule 0   No current facility-administered medications on file prior to visit.    Review of Systems:  As per HPI- otherwise negative.   Physical Examination: There were no vitals filed for this visit. There were no vitals filed for this visit. There is no height or weight on file to calculate BMI. Ideal Body Weight:    Pt observed over video monitor - he looks well, no SOB noted, no cough or wheezing  Sat 99% BP 147/95  Temp 98.6 Pulse 69  Assessment and Plan: COVID-19  Virtual visit today for presumed COVID-19.  Patient tested negative with a rapid test early in the course, his wife then developed symptoms and she tested positive.  Advised patient that he may certainly obtain a COVID-19 PCR.  However, in the absence of this test  we can presume that he does have COVID.  We discussed supportive care, he seems to be improving.  He does not request any particular prescription at this time.  Advised him to rest, drink plenty of fluids and use over-the-counter medications as needed for symptoms.  Provided note for work.  We will determine his return to work date based on his symptoms  Video used for the duration of visit today  Signed Abbe Amsterdam, MD

## 2020-06-17 ENCOUNTER — Encounter: Payer: Self-pay | Admitting: Family Medicine

## 2020-06-17 DIAGNOSIS — Z8739 Personal history of other diseases of the musculoskeletal system and connective tissue: Secondary | ICD-10-CM

## 2020-06-19 ENCOUNTER — Other Ambulatory Visit: Payer: Self-pay | Admitting: Family Medicine

## 2020-06-19 MED ORDER — PREDNISONE 20 MG PO TABS: ORAL_TABLET | ORAL | 0 refills | Status: DC

## 2020-06-19 MED FILL — predniSONE 20 MG TABS: 20 | 9 days supply | Qty: 9 | Fill #0

## 2020-06-28 ENCOUNTER — Other Ambulatory Visit: Payer: Self-pay | Admitting: Family Medicine

## 2020-06-28 MED ORDER — AMOXICILLIN 500 MG PO CAPS: 1000.0000 mg | ORAL_CAPSULE | Freq: Two times a day (BID) | ORAL | 0 refills | Status: DC

## 2020-06-28 MED FILL — AMOXICILLIN 500 MG CAPSULE: 500 | 10 days supply | Qty: 40 | Fill #0

## 2020-06-28 NOTE — Addendum Note (Signed)
Addended by: Abbe Amsterdam C on: 06/28/2020 09:11 AM   Modules accepted: Orders

## 2020-06-29 ENCOUNTER — Telehealth: Payer: Self-pay | Admitting: Emergency Medicine

## 2020-06-29 NOTE — Telephone Encounter (Signed)
Called and spoke to patient after receiving a letter from "[employer]". Patient reports receiving a similar letter. It is regarding the stress test from 03/2019 still. Called the company found out this is the last part of appeal process, they are a outside source and if approved the insurance has to honor that. Sending all office notes associated with the test just in case they do not have. Left message to inform patient of this advised him to call back if he wanted me to go into more detail.

## 2020-07-03 ENCOUNTER — Other Ambulatory Visit: Payer: Self-pay | Admitting: Family Medicine

## 2020-07-03 MED ORDER — ALLOPURINOL 300 MG PO TABS: ORAL_TABLET | ORAL | 3 refills | Status: DC

## 2020-07-03 NOTE — Addendum Note (Signed)
Addended by: Abbe Amsterdam C on: 07/03/2020 05:52 AM   Modules accepted: Orders

## 2020-07-07 NOTE — Progress Notes (Addendum)
[facility] at [facility] [address], [address] [place], [place] [address] [phone]  Date:  07/10/2020   Name:  Charles [name]   DOB:  13-Feb-1961   MRN:  [id number]  PCP:  Pearline Cables, MD    Chief Complaint: Annual Exam   History of Present Illness:  Charles [name] is a 60 y.o. very pleasant male patient who presents with the following:  Here today for a CPE- History of prediabetes, sleep apnea, obesity hyperlipidemia, CAD, hypertension, gout, psoriasis Last seen by myself on video last month; likely had covid 19 as his wife had covid at that time He is feeling basically back to normal  His allergies are a bit worse this time of year  He is having some trouble with GERD- per ENT he is using omeprazole He notes chronic back pain but he is able to continue to work and exercise   It is hard for him to lose weight- he does some traveling with work and this makes it difficult to stick to a schedule/ diet like he would like to    Tetanus booster done  covid series done  Colon UTD Flu done  Most recent labs about one year ago  Saw optho is November- all ok. No cataracts as of yet  His urologist is Dr Berneice Heinrich at [facility]  shingrix is done   Allopurinol-  He rarely has any sx Zyrtec Clonazepam as needed Fluoxetine Flonase Toprol-XL Nitro as needed- has not used  Crestor Flomax Omeprazole BID for GERD  Refilled his topicals for psoriasis- he will have intermittent sx  Exercise at home- he may do up to 6x a week when he is home and not traveling He enjoys working out on a bowflex machine  He also does some cardio  He admits to being under some stress.  He got a speeding ticket several years ago in [place], for some reason this translated to a 30-day suspended license in [place] which he is dealing with right now.  Wt Readings from Last 3 Encounters:  07/10/20 260 lb (117.9 kg)  09/08/19 251 lb (113.9 kg)  08/04/19  257 lb (116.6 kg)     Patient Active Problem List   Diagnosis Date Noted  . Pre-diabetes 06/28/2019  . Sepsis secondary to UTI (HCC) 11/17/2017  . UTI (urinary tract infection) 11/17/2017  . Left calcaneal bursitis 12/14/2014  . 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 Allergic reaction 10/14/2014  . Achilles tendinosis 10/05/2014  . OSA (obstructive sleep apnea) 08/18/2014  . Gout 08/14/2014  . Obesity 06/02/2013  . Abnormal MRI, spine 05/09/2013  . H/O urinary retention 05/09/2013  . Psoriasis 05/08/2012  . Coronary artery disease 08/03/2008  . Hyperlipidemia 11/10/2007  . History of gout 11/10/2007  . Essential hypertension 11/10/2007  . G E R D 01/06/2007    Past Medical History:  Diagnosis Date  . BPH (benign prostatic hypertrophy) with urinary retention   . CAD (coronary artery disease) 04/2008   successful PCI of the lesion in the proximal LAD using xience drug-eluting stent with improvement in central narrowing from 95% to 0%.  Dr. Juanda Chance  . Foley catheter in place   . Gout    per pt stable is of 08-18-2014  . History of acute bronchitis    recently 07-16-2014  . History of sepsis    urosepsis--  08-13-2014 admission  . Hyperlipidemia LDL goal <100   . Hypertension   .  Multilevel degenerative disc disease    cervical and lumbar  . OSA (obstructive sleep apnea)       . Psoriasis   . S/P drug eluting coronary stent placement    05-13-2008   DES X1 TO pLAD  . Sigmoid diverticulosis     Past Surgical History:  Procedure Laterality Date  . BUNIONECTOMY Right 2000  . COLONOSCOPY WITH PROPOFOL  last one 10-06-2013  . CORONARY ANGIOPLASTY WITH STENT PLACEMENT  05-13-2008    Dr Charlies Constable   PCI w/  DES x1 to  pLAD/  ef 65%  . GREEN LIGHT LASER TURP (TRANSURETHRAL RESECTION OF PROSTATE N/A 08/23/2014   Procedure: GREEN LIGHT LASER TURP (TRANSURETHRAL RESECTION OF PROSTATE;  Surgeon: Bjorn Pippin, MD;  Location: [facility];  Service:  Urology;  Laterality: N/A;  . WRIST GANGLION EXCISION Left 2001    Social History   Tobacco Use  . Smoking status: Former Smoker    Packs/day: 0.75    Years: 14.00    Pack years: 10.50    Types: Cigarettes    Quit date: 05/27/1997    Years since quitting: 23.1  . Smokeless tobacco: Former Neurosurgeon    Types: Snuff    Quit date: 08/17/1997  Vaping Use  . Vaping Use: Never used  Substance Use Topics  . Alcohol use: Yes    Comment: occasional  . Drug use: No    Family History  Problem Relation Age of Onset  . Hypertension Father   . Stroke Father 73  . Lung cancer Mother         smoker  . Aneurysm Mother   . Diabetes Sister   . Heart disease Brother 49        stent LAD 2012  . Colon cancer Neg Hx   . Pancreatic cancer Neg Hx   . Rectal cancer Neg Hx   . Stomach cancer Neg Hx     Allergies  Allergen Reactions  . Lidocaine Anaphylaxis    REACTION: anaphylactic shock (also he was on Neomycin sulfate)  . Neomycin Sulfate Anaphylaxis    REACTION: anaphylactic shock (also on Lidocaine  topically)  . Sulfa Antibiotics Anaphylaxis  . Sulfa Drugs Cross Reactors Anaphylaxis  . Hctz [Hydrochlorothiazide] Other (See Comments)    Gout   . Tramadol Other (See Comments)    05/26/13 urinary retention  . Atrovent [Ipratropium] Other (See Comments)    Caused Urinary retention (this is the nasal spray)  . Latex Itching and Rash    Skin irritation    Medication list has been reviewed and updated.  Current Outpatient Medications on File Prior to Visit  Medication Sig Dispense Refill  . allopurinol (ZYLOPRIM) 300 MG tablet TAKE 1 TABLET BY MOUTH EVERY DAY 90 tablet 3  . aspirin 81 MG tablet Take 1 tablet (81 mg total) by mouth daily. 90 tablet 1  . cetirizine (ZYRTEC) 10 MG tablet Take 10 mg by mouth at bedtime.    . clobetasol cream (TEMOVATE) 0.05 % Apply 1 application topically 2 (two) times daily. Apply 1 application topically 2 (two) times daily as needed. 300 gm is a 90 day  supply 300 g 3  . clonazePAM (KLONOPIN) 0.5 MG tablet Take 1 tablet (0.5 mg total) by mouth 2 (two) times daily as needed for anxiety. 20 tablet 1  . Coenzyme Q10 (CO Q 10 PO) Take 1 capsule by mouth every morning. 500mg     . finasteride (PROSCAR) 5 MG tablet Take 1 tablet (  5 mg total) by mouth daily. 30 tablet 0  . fluocinonide (LIDEX) 0.05 % external solution Apply 1 application topically 2 (two) times daily. As needed for psoriasis.  300 ml is a 90 day supply 300 mL 3  . fluocinonide cream (LIDEX) 0.05 % Apply 1 application topically 2 (two) times daily. As needed for psoriasis 60 g 2  . FLUoxetine (PROZAC) 20 MG capsule TAKE 1 CAPSULE BY MOUTH  DAILY 90 capsule 3  . FLUoxetine (PROZAC) 20 MG tablet Take 1 tablet (20 mg total) by mouth daily. 90 tablet 3  . fluticasone (FLONASE) 50 MCG/ACT nasal spray Place 2 sprays into both nostrils daily. 16 g 1  . metoprolol succinate (TOPROL-XL) 100 MG 24 hr tablet Take 1 tablet (100 mg total) by mouth daily. 90 tablet 0  . Multiple Vitamin (MULTIVITAMIN) capsule Take 1 capsule by mouth every morning.    . nitroGLYCERIN (NITROSTAT) 0.3 MG SL tablet Place 1 tablet (0.3 mg total) under the tongue every 5 (five) minutes as needed for chest pain. 25 tablet 2  . Omega-3 Fatty Acids (FISH OIL) 1000 MG CAPS Take 1,000 mg by mouth every morning.    . rosuvastatin (CRESTOR) 20 MG tablet TAKE 1 TABLET BY MOUTH  DAILY 90 tablet 3  . tamsulosin (FLOMAX) 0.4 MG CAPS capsule Take 1 capsule (0.4 mg total) by mouth daily after supper. 30 capsule 0   No current facility-administered medications on file prior to visit.    Review of Systems:  As per HPI- otherwise negative.   Physical Examination: Vitals:   07/10/20 0955  BP: 128/80  Pulse: 63  Resp: 17  SpO2: 92%   Vitals:   07/10/20 0955  Weight: 260 lb (117.9 kg)  Height: 5\' 8"  (1.727 m)   Body mass index is 39.53 kg/m. Ideal Body Weight: Weight in (lb) to have BMI = 25: 164.1  GEN: no acute  distress. Obese, looks well  HEENT: Atraumatic, Normocephalic.  Ears and Nose: No external deformity. CV: RRR, No M/G/R. No JVD. No thrill. No extra heart sounds. PULM: CTA B, no wheezes, crackles, rhonchi. No retractions. No resp. distress. No accessory muscle use. ABD: S, NT, ND, +BS. No rebound. No HSM. EXTR: No c/c/e PSYCH: Normally interactive. Conversant.    Assessment and Plan: Physical exam  Essential hypertension - Plan: CBC, Comprehensive metabolic panel, metoprolol succinate (TOPROL-XL) 100 MG 24 hr tablet  Mixed hyperlipidemia - Plan: Lipid panel  Screening for prostate cancer - Plan: PSA  Prediabetes - Plan: Hemoglobin A1c  OSA (obstructive sleep apnea)  Coronary artery disease involving native coronary artery of native heart without angina pectoris  Screening for thyroid disorder - Plan: TSH  History of gout - Plan: allopurinol (ZYLOPRIM) 300 MG tablet, Uric acid  Psoriasis - Plan: clobetasol cream (TEMOVATE) 0.05 %, fluocinonide (LIDEX) 0.05 % external solution, fluocinonide cream (LIDEX) 0.05 %  Here today for a CPE- he is overall doing well BP is under control Labs pending as above Refilled medications Encouraged him to continue exercise and his weight management  He is making an effort to manage his weight   This visit occurred during the SARS-CoV-2 public health emergency.  Safety protocols were in place, including screening questions prior to the visit, additional usage of staff PPE, and extensive cleaning of exam room while observing appropriate contact time as indicated for disinfecting solutions.    Signed Abbe Amsterdam, MD  Received labs as below, message to pt  Results for orders placed or performed  in visit on 07/10/20  CBC  Result Value Ref Range   WBC 5.8 4.0 - 10.5 K/uL   RBC 5.01 4.22 - 5.81 Mil/uL   Platelets 214.0 150.0 - 400.0 K/uL   Hemoglobin 14.8 13.0 - 17.0 g/dL   HCT 77.8 24.2 - 35.3 %   MCV 87.7 78.0 - 100.0 fl   MCHC  33.7 30.0 - 36.0 g/dL   RDW 61.4 43.1 - 54.0 %  Comprehensive metabolic panel  Result Value Ref Range   Sodium 138 135 - 145 mEq/L   Potassium 4.5 3.5 - 5.1 mEq/L   Chloride 101 96 - 112 mEq/L   CO2 29 19 - 32 mEq/L   Glucose, Bld 103 (H) 70 - 99 mg/dL   BUN 15 6 - 23 mg/dL   Creatinine, Ser 0.86 0.40 - 1.50 mg/dL   Total Bilirubin 0.8 0.2 - 1.2 mg/dL   Alkaline Phosphatase 42 39 - 117 U/L   AST 25 0 - 37 U/L   ALT 33 0 - 53 U/L   Total Protein 7.1 6.0 - 8.3 g/dL   Albumin 4.3 3.5 - 5.2 g/dL   GFR 76.19 >50.93 mL/min   Calcium 9.3 8.4 - 10.5 mg/dL  Hemoglobin O6Z  Result Value Ref Range   Hgb A1c MFr Bld 6.2 4.6 - 6.5 %  Lipid panel  Result Value Ref Range   Cholesterol 139 0 - 200 mg/dL   Triglycerides 124.5 (H) 0.0 - 149.0 mg/dL   HDL 80.99 >83.38 mg/dL   VLDL 25.0 (H) 0.0 - 53.9 mg/dL   Total CHOL/HDL Ratio 3    NonHDL 94.62   TSH  Result Value Ref Range   TSH 1.37 0.35 - 4.50 uIU/mL  PSA  Result Value Ref Range   PSA 1.00 0.10 - 4.00 ng/mL  Uric acid  Result Value Ref Range   Uric Acid, Serum 5.7 4.0 - 7.8 mg/dL  LDL cholesterol, direct  Result Value Ref Range   Direct LDL 70.0 mg/dL   J6B is stable

## 2020-07-07 NOTE — Patient Instructions (Addendum)
Good to see you again today-  I will be in touch with your labs asap Keep working on your weight and exercise Assuming all is well let's plan to visit in about 6 months     Health Maintenance, Male Adopting a healthy lifestyle and getting preventive care are important in promoting health and wellness. Ask your health care provider about:  The right schedule for you to have regular tests and exams.  Things you can do on your own to prevent diseases and keep yourself healthy. What should I know about diet, weight, and exercise? Eat a healthy diet  Eat a diet that includes plenty of vegetables, fruits, low-fat dairy products, and lean protein.  Do not eat a lot of foods that are high in solid fats, added sugars, or sodium.   Maintain a healthy weight Body mass index (BMI) is a measurement that can be used to identify possible weight problems. It estimates body fat based on height and weight. Your health care provider can help determine your BMI and help you achieve or maintain a healthy weight. Get regular exercise Get regular exercise. This is one of the most important things you can do for your health. Most adults should:  Exercise for at least 150 minutes each week. The exercise should increase your heart rate and make you sweat (moderate-intensity exercise).  Do strengthening exercises at least twice a week. This is in addition to the moderate-intensity exercise.  Spend less time sitting. Even light physical activity can be beneficial. Watch cholesterol and blood lipids Have your blood tested for lipids and cholesterol at 62 years of age, then have this test every 5 years. You may need to have your cholesterol levels checked more often if:  Your lipid or cholesterol levels are high.  You are older than 62 years of age.  You are at high risk for heart disease. What should I know about cancer screening? Many types of cancers can be detected early and may often be prevented.  Depending on your health history and family history, you may need to have cancer screening at various ages. This may include screening for:  Colorectal cancer.  Prostate cancer.  Skin cancer.  Lung cancer. What should I know about heart disease, diabetes, and high blood pressure? Blood pressure and heart disease  High blood pressure causes heart disease and increases the risk of stroke. This is more likely to develop in people who have high blood pressure readings, are of African descent, or are overweight.  Talk with your health care provider about your target blood pressure readings.  Have your blood pressure checked: ? Every 3-5 years if you are 60-24 years of age. ? Every year if you are 63 years old or older.  If you are between the ages of 84 and 50 and are a current or former smoker, ask your health care provider if you should have a one-time screening for abdominal aortic aneurysm (AAA). Diabetes Have regular diabetes screenings. This checks your fasting blood sugar level. Have the screening done:  Once every three years after age 64 if you are at a normal weight and have a low risk for diabetes.  More often and at a younger age if you are overweight or have a high risk for diabetes. What should I know about preventing infection? Hepatitis B If you have a higher risk for hepatitis B, you should be screened for this virus. Talk with your health care provider to find out if you are  at risk for hepatitis B infection. Hepatitis C Blood testing is recommended for:  Everyone born from [birth year] through 1967.  Anyone with known risk factors for hepatitis C. Sexually transmitted infections (STIs)  You should be screened each year for STIs, including gonorrhea and chlamydia, if: ? You are sexually active and are younger than 62 years of age. ? You are older than 62 years of age and your health care provider tells you that you are at risk for this type of infection. ? Your sexual  activity has changed since you were last screened, and you are at increased risk for chlamydia or gonorrhea. Ask your health care provider if you are at risk.  Ask your health care provider about whether you are at high risk for HIV. Your health care provider may recommend a prescription medicine to help prevent HIV infection. If you choose to take medicine to prevent HIV, you should first get tested for HIV. You should then be tested every 3 months for as long as you are taking the medicine. Follow these instructions at home: Lifestyle  Do not use any products that contain nicotine or tobacco, such as cigarettes, e-cigarettes, and chewing tobacco. If you need help quitting, ask your health care provider.  Do not use street drugs.  Do not share needles.  Ask your health care provider for help if you need support or information about quitting drugs. Alcohol use  Do not drink alcohol if your health care provider tells you not to drink.  If you drink alcohol: ? Limit how much you have to 0-2 drinks a day. ? Be aware of how much alcohol is in your drink. In the [place] one drink equals one 12 oz bottle of beer (355 mL), one 5 oz glass of wine (148 mL), or one 1 oz glass of hard liquor (44 mL). General instructions  Schedule regular health, dental, and eye exams.  Stay current with your vaccines.  Tell your health care provider if: ? You often feel depressed. ? You have ever been abused or do not feel safe at home. Summary  Adopting a healthy lifestyle and getting preventive care are important in promoting health and wellness.  Follow your health care provider's instructions about healthy diet, exercising, and getting tested or screened for diseases.  Follow your health care provider's instructions on monitoring your cholesterol and blood pressure. This information is not intended to replace advice given to you by your health care provider. Make sure you discuss any questions you have  with your health care provider. Document Revised: 05/06/2018 Document Reviewed: 05/06/2018 Elsevier Patient Education  2021 [employer].

## 2020-07-10 ENCOUNTER — Other Ambulatory Visit: Payer: Self-pay

## 2020-07-10 ENCOUNTER — Encounter: Payer: Self-pay | Admitting: Family Medicine

## 2020-07-10 ENCOUNTER — Ambulatory Visit (INDEPENDENT_AMBULATORY_CARE_PROVIDER_SITE_OTHER): Payer: BC Managed Care – PPO | Admitting: Family Medicine

## 2020-07-10 VITALS — BP 128/80 | HR 63 | Resp 17 | Ht 68.0 in | Wt 260.0 lb

## 2020-07-10 DIAGNOSIS — Z8739 Personal history of other diseases of the musculoskeletal system and connective tissue: Secondary | ICD-10-CM | POA: Diagnosis not present

## 2020-07-10 DIAGNOSIS — I2510 Atherosclerotic heart disease of native coronary artery without angina pectoris: Secondary | ICD-10-CM

## 2020-07-10 DIAGNOSIS — Z0000 Encounter for general adult medical examination without abnormal findings: Secondary | ICD-10-CM | POA: Diagnosis not present

## 2020-07-10 DIAGNOSIS — Z125 Encounter for screening for malignant neoplasm of prostate: Secondary | ICD-10-CM

## 2020-07-10 DIAGNOSIS — R7303 Prediabetes: Secondary | ICD-10-CM | POA: Diagnosis not present

## 2020-07-10 DIAGNOSIS — Z1329 Encounter for screening for other suspected endocrine disorder: Secondary | ICD-10-CM | POA: Diagnosis not present

## 2020-07-10 DIAGNOSIS — I10 Essential (primary) hypertension: Secondary | ICD-10-CM

## 2020-07-10 DIAGNOSIS — E782 Mixed hyperlipidemia: Secondary | ICD-10-CM

## 2020-07-10 DIAGNOSIS — Z20822 Contact with and (suspected) exposure to covid-19: Secondary | ICD-10-CM

## 2020-07-10 DIAGNOSIS — G4733 Obstructive sleep apnea (adult) (pediatric): Secondary | ICD-10-CM

## 2020-07-10 DIAGNOSIS — L409 Psoriasis, unspecified: Secondary | ICD-10-CM

## 2020-07-10 LAB — CBC
HCT: 43.9 % (ref 39.0–52.0)
Hemoglobin: 14.8 g/dL (ref 13.0–17.0)
MCHC: 33.7 g/dL (ref 30.0–36.0)
MCV: 87.7 fl (ref 78.0–100.0)
Platelets: 214 10*3/uL (ref 150.0–400.0)
RBC: 5.01 Mil/uL (ref 4.22–5.81)
RDW: 14.4 % (ref 11.5–15.5)
WBC: 5.8 10*3/uL (ref 4.0–10.5)

## 2020-07-10 LAB — COMPREHENSIVE METABOLIC PANEL
ALT: 33 U/L (ref 0–53)
AST: 25 U/L (ref 0–37)
Albumin: 4.3 g/dL (ref 3.5–5.2)
Alkaline Phosphatase: 42 U/L (ref 39–117)
BUN: 15 mg/dL (ref 6–23)
CO2: 29 mEq/L (ref 19–32)
Calcium: 9.3 mg/dL (ref 8.4–10.5)
Chloride: 101 mEq/L (ref 96–112)
Creatinine, Ser: 0.95 mg/dL (ref 0.40–1.50)
GFR: 86.35 mL/min (ref >60.00)
Glucose, Bld: 103 mg/dL — ABNORMAL HIGH (ref 70–99)
Potassium: 4.5 mEq/L (ref 3.5–5.1)
Sodium: 138 mEq/L (ref 135–145)
Total Bilirubin: 0.8 mg/dL (ref 0.2–1.2)
Total Protein: 7.1 g/dL (ref 6.0–8.3)

## 2020-07-10 LAB — LIPID PANEL
Cholesterol: 139 mg/dL (ref 0–200)
HDL: 44.5 mg/dL (ref >39.00)
NonHDL: 94.62
Total CHOL/HDL Ratio: 3
Triglycerides: 248 mg/dL — ABNORMAL HIGH (ref 0.0–149.0)
VLDL: 49.6 mg/dL — ABNORMAL HIGH (ref 0.0–40.0)

## 2020-07-10 LAB — LDL CHOLESTEROL, DIRECT: Direct LDL: 70 mg/dL

## 2020-07-10 LAB — TSH: TSH: 1.37 u[IU]/mL (ref 0.35–4.50)

## 2020-07-10 LAB — URIC ACID: Uric Acid, Serum: 5.7 mg/dL (ref 4.0–7.8)

## 2020-07-10 LAB — PSA: PSA: 1 ng/mL (ref 0.10–4.00)

## 2020-07-10 LAB — HEMOGLOBIN A1C: Hgb A1c MFr Bld: 6.2 % (ref 4.6–6.5)

## 2020-07-10 MED ORDER — FLUOCINONIDE 0.05 % EX CREA: 1.0000 "application " | TOPICAL_CREAM | Freq: Two times a day (BID) | CUTANEOUS | 2 refills | Status: DC

## 2020-07-10 MED ORDER — ALLOPURINOL 300 MG PO TABS: ORAL_TABLET | ORAL | 3 refills | Status: DC

## 2020-07-10 MED ORDER — METOPROLOL SUCCINATE ER 100 MG PO TB24: 100.0000 mg | ORAL_TABLET | Freq: Every day | ORAL | 0 refills | Status: DC

## 2020-07-10 MED ORDER — FLUOCINONIDE 0.05 % EX SOLN: 1.0000 "application " | Freq: Two times a day (BID) | CUTANEOUS | 3 refills | Status: DC

## 2020-07-10 MED ORDER — CLOBETASOL PROPIONATE 0.05 % EX CREA
1.0000 | TOPICAL_CREAM | Freq: Two times a day (BID) | CUTANEOUS | 3 refills | Status: DC
Start: 2020-07-10 — End: 2022-03-04

## 2020-07-10 MED FILL — ALLOPURINOL 300 MG TABLET: 300 | 30 days supply | Qty: 30 | Fill #0

## 2020-07-11 ENCOUNTER — Encounter: Payer: Self-pay | Admitting: Family Medicine

## 2020-07-11 LAB — SARS-COV-2 ANTIBODY(IGG)SPIKE,SEMI-QUANTITATIVE: SARS COV1 AB(IGG)SPIKE,SEMI QN: >150 index — ABNORMAL HIGH (ref <1.00)

## 2020-07-13 ENCOUNTER — Telehealth: Payer: Self-pay | Admitting: Emergency Medicine

## 2020-07-13 NOTE — Telephone Encounter (Signed)
Left message for patient to return call regarding myoview approval letter from [employer].

## 2020-07-27 DIAGNOSIS — R972 Elevated prostate specific antigen [PSA]: Secondary | ICD-10-CM | POA: Diagnosis not present

## 2020-07-27 DIAGNOSIS — R339 Retention of urine, unspecified: Secondary | ICD-10-CM | POA: Diagnosis not present

## 2020-07-27 DIAGNOSIS — R310 Gross hematuria: Secondary | ICD-10-CM | POA: Diagnosis not present

## 2020-09-11 ENCOUNTER — Encounter: Payer: Self-pay | Admitting: Family Medicine

## 2020-09-11 ENCOUNTER — Other Ambulatory Visit (HOSPITAL_BASED_OUTPATIENT_CLINIC_OR_DEPARTMENT_OTHER): Payer: Self-pay

## 2020-09-11 ENCOUNTER — Ambulatory Visit: Payer: BC Managed Care – PPO | Admitting: Family Medicine

## 2020-09-11 ENCOUNTER — Other Ambulatory Visit: Payer: Self-pay

## 2020-09-11 VITALS — BP 142/82 | HR 85 | Temp 97.7°F | Resp 17 | Ht 68.0 in | Wt 264.0 lb

## 2020-09-11 DIAGNOSIS — R197 Diarrhea, unspecified: Secondary | ICD-10-CM | POA: Diagnosis not present

## 2020-09-11 LAB — CBC
HCT: 41.8 % (ref 39.0–52.0)
Hemoglobin: 14 g/dL (ref 13.0–17.0)
MCHC: 33.4 g/dL (ref 30.0–36.0)
MCV: 87.1 fl (ref 78.0–100.0)
Platelets: 235 10*3/uL (ref 150.0–400.0)
RBC: 4.8 Mil/uL (ref 4.22–5.81)
RDW: 14.3 % (ref 11.5–15.5)
WBC: 6 10*3/uL (ref 4.0–10.5)

## 2020-09-11 LAB — COMPREHENSIVE METABOLIC PANEL
ALT: 25 U/L (ref 0–53)
AST: 28 U/L (ref 0–37)
Albumin: 4 g/dL (ref 3.5–5.2)
Alkaline Phosphatase: 54 U/L (ref 39–117)
BUN: 14 mg/dL (ref 6–23)
CO2: 28 mEq/L (ref 19–32)
Calcium: 8.6 mg/dL (ref 8.4–10.5)
Chloride: 102 mEq/L (ref 96–112)
Creatinine, Ser: 0.94 mg/dL (ref 0.40–1.50)
GFR: 87.35 mL/min (ref >60.00)
Glucose, Bld: 156 mg/dL — ABNORMAL HIGH (ref 70–99)
Potassium: 4.3 mEq/L (ref 3.5–5.1)
Sodium: 139 mEq/L (ref 135–145)
Total Bilirubin: 0.6 mg/dL (ref 0.2–1.2)
Total Protein: 6.5 g/dL (ref 6.0–8.3)

## 2020-09-11 MED ORDER — CIPROFLOXACIN HCL 500 MG PO TABS
500.0000 mg | ORAL_TABLET | Freq: Two times a day (BID) | ORAL | 0 refills | Status: DC
  Filled 2020-09-11: qty 10, 5d supply, fill #0

## 2020-09-11 NOTE — Progress Notes (Addendum)
[facility] [address], [address] [place], [place] [address] [phone] [phone]  Date:  09/11/2020   Name:  Charles [name]   DOB:  [birth year]   MRN:  [id number]  PCP:  Pearline Cablesopland, Arlisha Patalano C, MD    Chief Complaint: Abdominal Pain (Two weeks ago, lower abdominal discomfort, diarrhea,  loose stool, sweating but normal temp)   History of Present Illness:  Charles [name] is a 60 y.o. very pleasant male patient who presents with the following:  Charles CountsBob is here today for an acute visit Last seen by myself for a physical in February - History of prediabetes, sleep apnea, obesity hyperlipidemia, CAD, hypertension, gout, psoriasis  He does quite a bit of travel for work  [place] a deli sandwich - it tasted fine, but 3-4 hours later he noted some lower belly pain and loose stools which is still persistent now Not much gas He has had watery stools a couple of times, but mostly just loose He may have sudden urge for BM No fever but some sweats No blood in his stool No vomiting He is using some pepto which does help firm up stools He can do his usual activities.  However eating may trigger a BM  Colon UTD No sick contacts that he is aware of No international or exotic travel   Lab Results  Component Value Date   HGBA1C 6.2 07/10/2020     Patient Active Problem List   Diagnosis Date Noted  . Pre-diabetes 06/28/2019  . Sepsis secondary to UTI (HCC) 11/17/2017  . UTI (urinary tract infection) 11/17/2017  . Left calcaneal bursitis 12/14/2014  . 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 Allergic reaction 10/14/2014  . Achilles tendinosis 10/05/2014  . OSA (obstructive sleep apnea) 08/18/2014  . Gout 08/14/2014  . Obesity 06/02/2013  . Abnormal MRI, spine 05/09/2013  . H/O urinary retention 05/09/2013  . Psoriasis 05/08/2012  . Coronary artery disease 08/03/2008  . Hyperlipidemia 11/10/2007  .  History of gout 11/10/2007  . Essential hypertension 11/10/2007  . G E R D 01/06/2007    Past Medical History:  Diagnosis Date  . BPH (benign prostatic hypertrophy) with urinary retention   . CAD (coronary artery disease) 04/2008   successful PCI of the lesion in the proximal LAD using xience drug-eluting stent with improvement in central narrowing from 95% to 0%.  Dr. Juanda ChanceBrodie  . Foley catheter in place   . Gout    per pt stable is of 08-18-2014  . History of acute bronchitis    recently 07-16-2014  . History of sepsis    urosepsis--  08-13-2014 admission  . Hyperlipidemia LDL goal <100   . Hypertension   . Multilevel degenerative disc disease    cervical and lumbar  . OSA (obstructive sleep apnea)       . Psoriasis   . S/P drug eluting coronary stent placement    05-13-2008   DES X1 TO pLAD  . Sigmoid diverticulosis     Past Surgical History:  Procedure Laterality Date  . BUNIONECTOMY Right 2000  . COLONOSCOPY WITH PROPOFOL  last one 10-06-2013  . CORONARY ANGIOPLASTY WITH STENT PLACEMENT  05-13-2008    [name]   PCI w/  DES x1 to  pLAD/  ef 65%  . GREEN LIGHT LASER TURP (TRANSURETHRAL RESECTION OF PROSTATE N/A 08/23/2014   Procedure: GREEN LIGHT LASER TURP (TRANSURETHRAL RESECTION  OF PROSTATE;  Surgeon: Bjorn Pippin, MD;  Location: [facility]  Service: Urology;  Laterality: N/A;  . WRIST GANGLION EXCISION Left 2001    Social History   Tobacco Use  . Smoking status: Former Smoker    Packs/day: 0.75    Years: 14.00    Pack years: 10.50    Types: Cigarettes    Quit date: 05/27/1997    Years since quitting: 23.3  . Smokeless tobacco: Former Neurosurgeon    Types: Snuff    Quit date: 08/17/1997  Vaping Use  . Vaping Use: Never used  Substance Use Topics  . Alcohol use: Yes    Comment: occasional  . Drug use: No    Family History  Problem Relation Age of Onset  . Hypertension Father   . Stroke Father 40  . Lung cancer Mother         smoker  .  Aneurysm Mother   . Diabetes Sister   . Heart disease Brother 49        stent LAD 2012  . Colon cancer Neg Hx   . Pancreatic cancer Neg Hx   . Rectal cancer Neg Hx   . Stomach cancer Neg Hx     Allergies  Allergen Reactions  . Lidocaine Anaphylaxis    REACTION: anaphylactic shock (also he was on Neomycin sulfate)  . Neomycin Sulfate Anaphylaxis    REACTION: anaphylactic shock (also on Lidocaine  topically)  . Sulfa Antibiotics Anaphylaxis  . Sulfa Drugs Cross Reactors Anaphylaxis  . Hctz [Hydrochlorothiazide] Other (See Comments)    Gout   . Tramadol Other (See Comments)    05/26/13 urinary retention  . Atrovent [Ipratropium] Other (See Comments)    Caused Urinary retention (this is the nasal spray)  . Latex Itching and Rash    Skin irritation    Medication list has been reviewed and updated.  Current Outpatient Medications on File Prior to Visit  Medication Sig Dispense Refill  . allopurinol (ZYLOPRIM) 300 MG tablet TAKE 1 TABLET BY MOUTH EVERY DAY 90 tablet 3  . aspirin 81 MG tablet Take 1 tablet (81 mg total) by mouth daily. 90 tablet 1  . cetirizine (ZYRTEC) 10 MG tablet Take 10 mg by mouth at bedtime.    . clobetasol cream (TEMOVATE) 0.05 % Apply 1 application topically 2 (two) times daily. Apply 1 application topically 2 (two) times daily as needed. 300 gm is a 90 day supply 300 g 3  . clonazePAM (KLONOPIN) 0.5 MG tablet Take 1 tablet (0.5 mg total) by mouth 2 (two) times daily as needed for anxiety. 20 tablet 1  . Coenzyme Q10 (CO Q 10 PO) Take 1 capsule by mouth every morning. 500mg     . finasteride (PROSCAR) 5 MG tablet Take 1 tablet (5 mg total) by mouth daily. 30 tablet 0  . fluocinonide (LIDEX) 0.05 % external solution Apply 1 application topically 2 (two) times daily. As needed for psoriasis.  300 ml is a 90 day supply 300 mL 3  . fluocinonide cream (LIDEX) 0.05 % Apply 1 application topically 2 (two) times daily. As needed for psoriasis 60 g 2  . FLUoxetine  (PROZAC) 20 MG capsule TAKE 1 CAPSULE BY MOUTH  DAILY 90 capsule 3  . fluticasone (FLONASE) 50 MCG/ACT nasal spray Place 2 sprays into both nostrils daily. 16 g 1  . metoprolol succinate (TOPROL-XL) 100 MG 24 hr tablet Take 1 tablet (100 mg total) by mouth daily. 90 tablet 0  .  Multiple Vitamin (MULTIVITAMIN) capsule Take 1 capsule by mouth every morning.    . nitroGLYCERIN (NITROSTAT) 0.3 MG SL tablet Place 1 tablet (0.3 mg total) under the tongue every 5 (five) minutes as needed for chest pain. 25 tablet 2  . Omega-3 Fatty Acids (FISH OIL) 1000 MG CAPS Take 1,000 mg by mouth every morning.    . rosuvastatin (CRESTOR) 20 MG tablet TAKE 1 TABLET BY MOUTH  DAILY 90 tablet 3  . tamsulosin (FLOMAX) 0.4 MG CAPS capsule Take 1 capsule (0.4 mg total) by mouth daily after supper. 30 capsule 0   No current facility-administered medications on file prior to visit.    Review of Systems:  As per HPI- otherwise negative.   Physical Examination: Vitals:   09/11/20 1034  BP: (!) 142/82  Pulse: 85  Resp: 17  Temp: 97.7 F (36.5 C)  SpO2: 96%   Vitals:   09/11/20 1034  Weight: 264 lb (119.7 kg)  Height: 5\' 8"  (1.727 m)   Body mass index is 40.14 kg/m. Ideal Body Weight: Weight in (lb) to have BMI = 25: 164.1  GEN: no acute distress. Obese, otherwise looks well  HEENT: Atraumatic, Normocephalic.  Ears and Nose: No external deformity. CV: RRR, No M/G/R. No JVD. No thrill. No extra heart sounds. PULM: CTA B, no wheezes, crackles, rhonchi. No retractions. No resp. distress. No accessory muscle use. ABD: S, NT, ND, +BS. No rebound. No HSM.  Benign belly  EXTR: No c/c/e PSYCH: Normally interactive. Conversant.    Assessment and Plan: Diarrhea of presumed infectious origin - Plan: Stool Culture, Clostridium Difficile by PCR([facility]), CBC, Comprehensive metabolic panel, ciprofloxacin (CIPRO) 500 MG tablet Patient today with persistent GI symptoms of diarrhea and mild abdominal pain  since eating a deli sandwich about 2 and half weeks ago.  We will use Cipro 500 twice daily for 3 to 5 days, labs and stool cultures pending as above.  He can use Imodium as needed for diarrhea.  He is asked to let me know if getting worse, otherwise I will be in touch with his results of soon as possible  This visit occurred during the SARS-CoV-2 public health emergency.  Safety protocols were in place, including screening questions prior to the visit, additional usage of staff PPE, and extensive cleaning of exam room while observing appropriate contact time as indicated for disinfecting solutions.    Signed , MD  Received his labs, message to pt Results for orders placed or performed in visit on 09/11/20  CBC  Result Value Ref Range   WBC 6.0 4.0 - 10.5 K/uL   RBC 4.80 4.22 - 5.81 Mil/uL   Platelets 235.0 150.0 - 400.0 K/uL   Hemoglobin 14.0 13.0 - 17.0 g/dL   HCT 09/13/20 00.8 - 67.6 %   MCV 87.1 78.0 - 100.0 fl   MCHC 33.4 30.0 - 36.0 g/dL   RDW 19.5 09.3 - 26.7 %  Comprehensive metabolic panel  Result Value Ref Range   Sodium 139 135 - 145 mEq/L   Potassium 4.3 3.5 - 5.1 mEq/L   Chloride 102 96 - 112 mEq/L   CO2 28 19 - 32 mEq/L   Glucose, Bld 156 (H) 70 - 99 mg/dL   BUN 14 6 - 23 mg/dL   Creatinine, Ser 12.4 0.40 - 1.50 mg/dL   Total Bilirubin 0.6 0.2 - 1.2 mg/dL   Alkaline Phosphatase 54 39 - 117 U/L   AST 28 0 - 37 U/L   ALT 25  0 - 53 U/L   Total Protein 6.5 6.0 - 8.3 g/dL   Albumin 4.0 3.5 - 5.2 g/dL   GFR 86.57 >84.69 mL/min   Calcium 8.6 8.4 - 10.5 mg/dL

## 2020-09-11 NOTE — Patient Instructions (Signed)
It was great to see you again today, I am sorry you are having this stomach trouble! We will check your blood work today, and also give you a kit for stool culture.  Please turn in your stool sample as soon as possible.  In the meantime, we will start you on Cipro twice a day.  Take this for 3 to 5 days for any infectious cause of diarrhea Stick to a bland diet, plenty of fluids.  You can use Imodium as needed especially prior to travel. Contact me if developing fevers or bloody stools, or otherwise getting worse

## 2020-09-12 ENCOUNTER — Other Ambulatory Visit: Payer: BC Managed Care – PPO

## 2020-09-12 DIAGNOSIS — R197 Diarrhea, unspecified: Secondary | ICD-10-CM

## 2020-09-12 NOTE — Addendum Note (Signed)
Addended by: Rosita Kea on: 09/12/2020 10:59 AM   Modules accepted: Orders

## 2020-09-14 LAB — CLOSTRIDIUM DIFFICILE BY PCR: Toxigenic C. Difficile by PCR: NEGATIVE

## 2020-09-17 LAB — STOOL CULTURE: E coli, Shiga toxin Assay: NEGATIVE

## 2020-09-18 ENCOUNTER — Encounter: Payer: Self-pay | Admitting: Family Medicine

## 2020-10-15 ENCOUNTER — Encounter: Payer: Self-pay | Admitting: Family Medicine

## 2020-10-15 DIAGNOSIS — I10 Essential (primary) hypertension: Secondary | ICD-10-CM

## 2020-10-16 MED ORDER — METOPROLOL SUCCINATE ER 100 MG PO TB24: 100.0000 mg | ORAL_TABLET | Freq: Every day | ORAL | 3 refills | Status: DC

## 2020-12-06 ENCOUNTER — Other Ambulatory Visit: Payer: Self-pay

## 2020-12-06 ENCOUNTER — Ambulatory Visit (INDEPENDENT_AMBULATORY_CARE_PROVIDER_SITE_OTHER): Payer: BC Managed Care – PPO | Admitting: Family Medicine

## 2020-12-06 ENCOUNTER — Other Ambulatory Visit (HOSPITAL_BASED_OUTPATIENT_CLINIC_OR_DEPARTMENT_OTHER): Payer: Self-pay

## 2020-12-06 ENCOUNTER — Encounter: Payer: Self-pay | Admitting: Family Medicine

## 2020-12-06 VITALS — BP 126/70 | HR 71 | Temp 99.0°F | Ht 68.0 in | Wt 262.8 lb

## 2020-12-06 DIAGNOSIS — M62838 Other muscle spasm: Secondary | ICD-10-CM | POA: Diagnosis not present

## 2020-12-06 DIAGNOSIS — M545 Low back pain, unspecified: Secondary | ICD-10-CM

## 2020-12-06 MED ORDER — CYCLOBENZAPRINE HCL 10 MG PO TABS
10.0000 mg | ORAL_TABLET | Freq: Two times a day (BID) | ORAL | 0 refills | Status: DC
  Filled 2020-12-06: qty 30, 15d supply, fill #0

## 2020-12-06 NOTE — Progress Notes (Signed)
[facility] at [facility] [address] [place], [place] [address] [phone]  Date:  12/06/2020   Name:  Charles [name]   DOB:  March 08, 1961   MRN:  [id number]  PCP:  Pearline Cables, MD    Chief Complaint: Back Pain (Going on since 11/27/20. Lower back is painful after sleeping weird for a couple. )   History of Present Illness:  Charles [name] is a 60 y.o. 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 He continued to work hard the rest of the week- over the weekend he tried to take it easy and used OTC medications for his back  He is continuing to have pain and stiffness He is able to walk and shower, etc His pain is more on the right side, below the belt line The pain does not radiate into his legs generally No numbness or weakness noted No urinary sx   Lab Results  Component Value Date   HGBA1C 6.2 07/10/2020     Patient Active Problem List   Diagnosis Date Noted   Pre-diabetes 06/28/2019   Sepsis secondary to UTI (HCC) 11/17/2017   UTI (urinary tract infection) 11/17/2017   Left calcaneal bursitis 12/14/2014   Pronation deformity of ankle, acquired 10/26/2014   Allergic reaction 10/14/2014   Achilles tendinosis 10/05/2014   OSA (obstructive sleep apnea) 08/18/2014   Gout 08/14/2014   Obesity 06/02/2013   Abnormal MRI, spine 05/09/2013   H/O urinary retention 05/09/2013   Psoriasis 05/08/2012   Coronary artery disease 08/03/2008   Hyperlipidemia 11/10/2007   History of gout 11/10/2007    Essential hypertension 11/10/2007   G E R D 01/06/2007    Past Medical History:  Diagnosis Date   BPH (benign prostatic hypertrophy) with urinary retention    CAD (coronary artery disease) 04/2008   successful PCI of the lesion in the proximal LAD using xience drug-eluting stent with improvement in central narrowing from 95% to 0%.  Dr. Juanda Chance   Foley catheter in place    Gout    per pt stable is of 08-18-2014   History of acute bronchitis    recently 07-16-2014   History of sepsis    urosepsis--  08-13-2014 admission   Hyperlipidemia LDL goal <100    Hypertension    Multilevel degenerative disc disease    cervical and lumbar   OSA (obstructive sleep apnea)        Psoriasis    S/P drug eluting coronary stent placement    05-13-2008   DES X1 TO pLAD   Sigmoid diverticulosis     Past Surgical History:  Procedure Laterality Date   BUNIONECTOMY Right 2000   COLONOSCOPY WITH PROPOFOL  last one 10-06-2013   CORONARY ANGIOPLASTY WITH STENT PLACEMENT  05-13-2008    Dr Charlies Constable   PCI w/  DES x1 to  pLAD/  ef 65%   GREEN LIGHT LASER TURP (TRANSURETHRAL RESECTION OF PROSTATE N/A 08/23/2014   Procedure:  GREEN LIGHT LASER TURP (TRANSURETHRAL RESECTION OF PROSTATE;  Surgeon: Bjorn Pippin, MD;  Location: [facility];  Service: Urology;  Laterality: N/A;   WRIST GANGLION EXCISION Left 2001    Social History   Tobacco Use   Smoking status: Former    Packs/day: 0.75    Years: 14.00    Pack years: 10.50    Types: Cigarettes    Quit date: 05/27/1997    Years since quitting: 23.5   Smokeless tobacco: Former    Types: Snuff    Quit date: 08/17/1997  Vaping Use   Vaping Use: Never used  Substance Use Topics   Alcohol use: Yes    Comment: occasional   Drug use: No    Family History  Problem Relation Age of Onset   Hypertension Father    Stroke Father 25   Lung cancer Mother         smoker   Aneurysm Mother    Diabetes Sister    Heart disease Brother 38         stent LAD 2012   Colon cancer Neg Hx    Pancreatic cancer Neg Hx    Rectal cancer Neg Hx    Stomach cancer Neg Hx     Allergies  Allergen Reactions   Lidocaine Anaphylaxis    REACTION: anaphylactic shock (also he was on Neomycin sulfate)   Neomycin Sulfate Anaphylaxis    REACTION: anaphylactic shock (also on Lidocaine  topically)   Sulfa Antibiotics Anaphylaxis   Sulfa Drugs Cross Reactors Anaphylaxis   Hctz [Hydrochlorothiazide] Other (See Comments)    Gout    Tramadol Other (See Comments)    05/26/13 urinary retention   Atrovent [Ipratropium] Other (See Comments)    Caused Urinary retention (this is the nasal spray)   Latex Itching and Rash    Skin irritation    Medication list has been reviewed and updated.  Current Outpatient Medications on File Prior to Visit  Medication Sig Dispense Refill   allopurinol (ZYLOPRIM) 300 MG tablet TAKE 1 TABLET BY MOUTH EVERY DAY 90 tablet 3   aspirin 81 MG tablet Take 1 tablet (81 mg total) by mouth daily. 90 tablet 1   cetirizine (ZYRTEC) 10 MG tablet Take 10 mg by mouth at bedtime.     clobetasol cream (TEMOVATE) 0.05 % Apply 1 application topically 2 (two) times daily. Apply 1 application topically 2 (two) times daily as needed. 300 gm is a 90 day supply 300 g 3   clonazePAM (KLONOPIN) 0.5 MG tablet Take 1 tablet (0.5 mg total) by mouth 2 (two) times daily as needed for anxiety. 20 tablet 1   Coenzyme Q10 (CO Q 10 PO) Take 1 capsule by mouth every morning. 500mg      finasteride (PROSCAR) 5 MG tablet Take 1 tablet (5 mg total) by mouth daily. 30 tablet 0   fluocinonide (LIDEX) 0.05 % external solution Apply 1 application topically 2 (two) times daily. As needed for psoriasis.  300 ml is a 90 day supply 300 mL 3   fluocinonide cream (LIDEX) 0.05 % Apply 1 application topically 2 (two) times daily. As needed for psoriasis 60 g 2   FLUoxetine (PROZAC) 20 MG capsule TAKE 1 CAPSULE BY MOUTH  DAILY 90 capsule 3   fluticasone (FLONASE) 50  MCG/ACT nasal spray Place 2 sprays into both nostrils daily. 16 g 1   metoprolol succinate (TOPROL-XL) 100 MG 24 hr tablet Take 1 tablet (100 mg total) by mouth daily. 90  tablet 3   Multiple Vitamin (MULTIVITAMIN) capsule Take 1 capsule by mouth every morning.     nitroGLYCERIN (NITROSTAT) 0.3 MG SL tablet Place 1 tablet (0.3 mg total) under the tongue every 5 (five) minutes as needed for chest pain. 25 tablet 2   Omega-3 Fatty Acids (FISH OIL) 1000 MG CAPS Take 1,000 mg by mouth every morning.     rosuvastatin (CRESTOR) 20 MG tablet TAKE 1 TABLET BY MOUTH  DAILY 90 tablet 3   tamsulosin (FLOMAX) 0.4 MG CAPS capsule Take 1 capsule (0.4 mg total) by mouth daily after supper. 30 capsule 0   No current facility-administered medications on file prior to visit.    Review of Systems:  As per HPI- otherwise negative.   Physical Examination: Vitals:   12/06/20 0946  BP: 126/70  Pulse: 71  Temp: 99 F (37.2 C)  SpO2: 97%   Vitals:   12/06/20 0946  Weight: 262 lb 12.8 oz (119.2 kg)  Height: 5\' 8"  (1.727 m)   Body mass index is 39.96 kg/m. Ideal Body Weight: Weight in (lb) to have BMI = 25: 164.1  GEN: no acute distress.  Obese, otherwise looks well HEENT: Atraumatic, Normocephalic.  Ears and Nose: No external deformity. CV: RRR, No M/G/R. No JVD. No thrill. No extra heart sounds. PULM: CTA B, no wheezes, crackles, rhonchi. No retractions. No resp. distress. No accessory muscle use. Diffuse plaque psoriasis is present over the skin EXTR: No c/c/e PSYCH: Normally interactive. Conversant.  Lower back- tenderness and spasm on the right - lumbar paraspinous musculature Negative SLR bilateraly  Normal strength and sensation of both lower extremities, able to rise on his toes  Denies any bowel or bladder incontinence  Assessment and Plan: Acute right-sided low back pain without sciatica - Plan: cyclobenzaprine (FLEXERIL) 10 MG tablet  Muscle spasm - Plan: cyclobenzaprine (FLEXERIL)  10 MG tablet  Patient seen today with back spasm, seems to have been triggered by sleeping in a chair for few hours.  He also has a physically vigorous job  We will have him stay out of work until next week, rest, heat, gentle stretches and activity  Flexeril as needed for symptoms, caution regarding sedation  He will let me know if not feeling better in the next few days, sooner if worse  This visit occurred during the SARS-CoV-2 public health emergency.  Safety protocols were in place, including screening questions prior to the visit, additional usage of staff PPE, and extensive cleaning of exam room while observing appropriate contact time as indicated for disinfecting solutions.   Signed , MD

## 2020-12-06 NOTE — Patient Instructions (Signed)
It was good to see you today but I am sorry that your hurt your back!  Take it easy- try heat and gentle activity/ stretching Use the muscle relaxer as needed- remember this will make you sleepy!   Let me know if not back to normal in about one week- Sooner if worse.

## 2020-12-13 ENCOUNTER — Encounter: Payer: Self-pay | Admitting: Family Medicine

## 2020-12-13 DIAGNOSIS — M545 Low back pain, unspecified: Secondary | ICD-10-CM

## 2020-12-13 MED ORDER — PREDNISONE 20 MG PO TABS: ORAL_TABLET | ORAL | 0 refills | Status: DC

## 2021-01-01 NOTE — Progress Notes (Deleted)
[facility] at [facility] [address] [place], [place] [address] [phone]  Date:  01/08/2021   Name:  Charles [name]   DOB:  May 21, 1961   MRN:  [id number]  PCP:  Pearline Cables, MD    Chief Complaint: No chief complaint on file.   History of Present Illness:  Charles [name] is a 60 y.o. 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  At that time he was asymptomatic from a CAD standpoint, the plan was to control cholesterol and blood pressure Patient Active Problem List   Diagnosis Date Noted   Pre-diabetes 06/28/2019   Sepsis secondary to UTI (HCC) 11/17/2017   UTI (urinary tract infection) 11/17/2017   Left calcaneal bursitis 12/14/2014   Pronation deformity of ankle, acquired 10/26/2014   Allergic reaction 10/14/2014   Achilles tendinosis 10/05/2014   OSA (obstructive sleep apnea) 08/18/2014   Gout 08/14/2014   Obesity 06/02/2013   Abnormal MRI, spine 05/09/2013   H/O urinary retention 05/09/2013   Psoriasis 05/08/2012   Coronary artery disease 08/03/2008   Hyperlipidemia 11/10/2007   History of gout 11/10/2007   Essential hypertension 11/10/2007   G E R D 01/06/2007    Past Medical History:  Diagnosis Date   BPH (benign prostatic hypertrophy) with urinary retention    CAD (coronary artery disease) 04/2008   successful PCI of the lesion in the proximal LAD using xience drug-eluting stent with improvement in central narrowing from 95% to 0%.  Dr. Juanda Chance   Foley catheter in place    Gout    per pt stable is of 08-18-2014   History of acute  bronchitis    recently 07-16-2014   History of sepsis    urosepsis--  08-13-2014 admission   Hyperlipidemia LDL goal <100    Hypertension    Multilevel degenerative disc disease    cervical and lumbar   OSA (obstructive sleep apnea)        Psoriasis    S/P drug eluting coronary stent placement    05-13-2008   DES X1 TO pLAD   Sigmoid diverticulosis     Past Surgical History:  Procedure Laterality Date   BUNIONECTOMY Right 2000   COLONOSCOPY WITH PROPOFOL  last one 10-06-2013   CORONARY ANGIOPLASTY WITH STENT PLACEMENT  05-13-2008    Dr Charlies Constable   PCI w/  DES x1 to  pLAD/  ef 65%   GREEN LIGHT LASER TURP (TRANSURETHRAL RESECTION OF PROSTATE N/A 08/23/2014   Procedure: GREEN LIGHT LASER TURP (TRANSURETHRAL RESECTION OF PROSTATE;  Surgeon: Bjorn Pippin, MD;  Location: [facility];  Service: Urology;  Laterality: N/A;   WRIST GANGLION EXCISION Left 2001    Social History   Tobacco Use   Smoking status: Former    Packs/day: 0.75    Years: 14.00    Pack years: 10.50    Types: Cigarettes    Quit date: 05/27/1997    Years since quitting: 23.6   Smokeless tobacco: Former    Types: Snuff    Quit date: 08/17/1997  Vaping Use  Vaping Use: Never used  Substance Use Topics   Alcohol use: Yes    Comment: occasional   Drug use: No    Family History  Problem Relation Age of Onset   Hypertension Father    Stroke Father 30   Lung cancer Mother         smoker   Aneurysm Mother    Diabetes Sister    Heart disease Brother 49        stent LAD 2012   Colon cancer Neg Hx    Pancreatic cancer Neg Hx    Rectal cancer Neg Hx    Stomach cancer Neg Hx     Allergies  Allergen Reactions   Lidocaine Anaphylaxis    REACTION: anaphylactic shock (also he was on Neomycin sulfate)   Neomycin Sulfate Anaphylaxis    REACTION: anaphylactic shock (also on Lidocaine  topically)   Sulfa Antibiotics Anaphylaxis   Sulfa Drugs Cross Reactors Anaphylaxis   Hctz  [Hydrochlorothiazide] Other (See Comments)    Gout    Tramadol Other (See Comments)    05/26/13 urinary retention   Atrovent [Ipratropium] Other (See Comments)    Caused Urinary retention (this is the nasal spray)   Latex Itching and Rash    Skin irritation    Medication list has been reviewed and updated.  Current Outpatient Medications on File Prior to Visit  Medication Sig Dispense Refill   allopurinol (ZYLOPRIM) 300 MG tablet TAKE 1 TABLET BY MOUTH EVERY DAY 90 tablet 3   aspirin 81 MG tablet Take 1 tablet (81 mg total) by mouth daily. 90 tablet 1   cetirizine (ZYRTEC) 10 MG tablet Take 10 mg by mouth at bedtime.     clobetasol cream (TEMOVATE) 0.05 % Apply 1 application topically 2 (two) times daily. Apply 1 application topically 2 (two) times daily as needed. 300 gm is a 90 day supply 300 g 3   clonazePAM (KLONOPIN) 0.5 MG tablet Take 1 tablet (0.5 mg total) by mouth 2 (two) times daily as needed for anxiety. 20 tablet 1   Coenzyme Q10 (CO Q 10 PO) Take 1 capsule by mouth every morning. 500mg      cyclobenzaprine (FLEXERIL) 10 MG tablet Take 1 tablet (10 mg total) by mouth 2 (two) times daily. 30 tablet 0   finasteride (PROSCAR) 5 MG tablet Take 1 tablet (5 mg total) by mouth daily. 30 tablet 0   fluocinonide (LIDEX) 0.05 % external solution Apply 1 application topically 2 (two) times daily. As needed for psoriasis.  300 ml is a 90 day supply 300 mL 3   fluocinonide cream (LIDEX) 0.05 % Apply 1 application topically 2 (two) times daily. As needed for psoriasis 60 g 2   FLUoxetine (PROZAC) 20 MG capsule TAKE 1 CAPSULE BY MOUTH  DAILY 90 capsule 3   fluticasone (FLONASE) 50 MCG/ACT nasal spray Place 2 sprays into both nostrils daily. 16 g 1   metoprolol succinate (TOPROL-XL) 100 MG 24 hr tablet Take 1 tablet (100 mg total) by mouth daily. 90 tablet 3   Multiple Vitamin (MULTIVITAMIN) capsule Take 1 capsule by mouth every morning.     nitroGLYCERIN (NITROSTAT) 0.3 MG SL tablet Place 1  tablet (0.3 mg total) under the tongue every 5 (five) minutes as needed for chest pain. 25 tablet 2   Omega-3 Fatty Acids (FISH OIL) 1000 MG CAPS Take 1,000 mg by mouth every morning.     predniSONE (DELTASONE) 20 MG tablet Take 40 mg by mouth daily for  3 days, then 20 mg daily for 3 days 9 tablet 0   rosuvastatin (CRESTOR) 20 MG tablet TAKE 1 TABLET BY MOUTH  DAILY 90 tablet 3   tamsulosin (FLOMAX) 0.4 MG CAPS capsule Take 1 capsule (0.4 mg total) by mouth daily after supper. 30 capsule 0   No current facility-administered medications on file prior to visit.    Review of Systems:  As per HPI- otherwise negative.   Physical Examination: There were no vitals filed for this visit. There were no vitals filed for this visit. There is no height or weight on file to calculate BMI. Ideal Body Weight:    GEN: no acute distress. HEENT: Atraumatic, Normocephalic.  Ears and Nose: No external deformity. CV: RRR, No M/G/R. No JVD. No thrill. No extra heart sounds. PULM: CTA B, no wheezes, crackles, rhonchi. No retractions. No resp. distress. No accessory muscle use. ABD: S, NT, ND, +BS. No rebound. No HSM. EXTR: No c/c/e PSYCH: Normally interactive. Conversant.    Assessment and Plan: *** This visit occurred during the SARS-CoV-2 public health emergency.  Safety protocols were in place, including screening questions prior to the visit, additional usage of staff PPE, and extensive cleaning of exam room while observing appropriate contact time as indicated for disinfecting solutions.   Signed Abbe Amsterdam, MD

## 2021-01-08 ENCOUNTER — Ambulatory Visit: Payer: BC Managed Care – PPO | Admitting: Family Medicine

## 2021-01-08 DIAGNOSIS — I2510 Atherosclerotic heart disease of native coronary artery without angina pectoris: Secondary | ICD-10-CM

## 2021-01-08 DIAGNOSIS — I10 Essential (primary) hypertension: Secondary | ICD-10-CM

## 2021-01-08 DIAGNOSIS — G4733 Obstructive sleep apnea (adult) (pediatric): Secondary | ICD-10-CM

## 2021-01-08 DIAGNOSIS — E782 Mixed hyperlipidemia: Secondary | ICD-10-CM

## 2021-01-08 DIAGNOSIS — R7303 Prediabetes: Secondary | ICD-10-CM

## 2021-02-04 ENCOUNTER — Encounter: Payer: Self-pay | Admitting: Family Medicine

## 2021-02-04 DIAGNOSIS — M545 Low back pain, unspecified: Secondary | ICD-10-CM

## 2021-02-05 MED ORDER — PREDNISONE 20 MG PO TABS: ORAL_TABLET | ORAL | 0 refills | Status: DC

## 2021-02-05 NOTE — Patient Instructions (Addendum)
Good to see you again today - I will be in touch with your back x-rays and your quantiferon test Please let me know how your back does with the prednisone We might move towards an MRI if symptoms persist  If changing or getting worse please call me

## 2021-02-05 NOTE — Progress Notes (Signed)
[facility] at [facility] [address], [address] [place], [place] [address] [phone]  Date:  02/07/2021   Name:  Charles [name]   DOB:  1961/03/02   MRN:  [id number]  PCP:  Pearline Cables, MD    Chief Complaint: Back Pain (Pinched nerve, given prednisone, no better, unable to rest due to job) and Feet Pain (Bilateral foot pain, burning, waking up at night. )   History of Present Illness:  Charles [name] is a 60 y.o. 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   He was seen in July with back pain; treated with flexeril and prednisone He recently contacted me with concern of persistent back pain and also now bilateral foot pain  I sent in a 2nd round of prednisone for him last week; he started this just yesterday and seems to be helping at least some  His work has been less intense this week as well which may be helping  If he tries to raise his right leg he will feel the pain The pain may travel down his right leg (not the left) and he is getting more numbness in the right leg Does not bother him when active and walking- when he is standing still he may get the numbness Not bothered while driving  No saddle anesthesia or difficulty with bowel or bladder control  Also, he recently noticed an episode of pain in his feet that started after he stood for a long time About the same in both feet His feet are still a bit tender other improved from last week He has not had this in the past  He notes it less when he is standing up  He did have an MRI of his entire spine in 12/14   Patient Active Problem List   Diagnosis Date Noted   Pre-diabetes 06/28/2019   Sepsis secondary to UTI (HCC) 11/17/2017   UTI (urinary tract infection) 11/17/2017   Left calcaneal  bursitis 12/14/2014   Pronation deformity of ankle, acquired 10/26/2014   Allergic reaction 10/14/2014   Achilles tendinosis 10/05/2014   OSA (obstructive sleep apnea) 08/18/2014   Gout 08/14/2014   Obesity 06/02/2013   Abnormal MRI, spine 05/09/2013   H/O urinary retention 05/09/2013   Psoriasis 05/08/2012   Coronary artery disease 08/03/2008   Hyperlipidemia 11/10/2007   History of gout 11/10/2007   Essential hypertension 11/10/2007   G E R D 01/06/2007    Past Medical History:  Diagnosis Date   BPH (benign prostatic hypertrophy) with urinary retention    CAD (coronary artery disease) 04/2008   successful PCI of the lesion in the proximal LAD using xience drug-eluting stent with improvement in central narrowing from 95% to 0%.  Dr. Juanda Chance   Foley catheter in place    Gout    per pt stable is of 08-18-2014   History of acute bronchitis    recently 07-16-2014   History of sepsis    urosepsis--  08-13-2014 admission   Hyperlipidemia LDL goal <100    Hypertension    Multilevel degenerative disc disease    cervical and lumbar   OSA (obstructive sleep apnea)        Psoriasis    S/P drug eluting coronary stent placement    05-13-2008  DES X1 TO pLAD   Sigmoid diverticulosis     Past Surgical History:  Procedure Laterality Date   BUNIONECTOMY Right 2000   COLONOSCOPY WITH PROPOFOL  last one 10-06-2013   CORONARY ANGIOPLASTY WITH STENT PLACEMENT  05-13-2008    Dr Charlies Constable   PCI w/  DES x1 to  pLAD/  ef 65%   GREEN LIGHT LASER TURP (TRANSURETHRAL RESECTION OF PROSTATE N/A 08/23/2014   Procedure: GREEN LIGHT LASER TURP (TRANSURETHRAL RESECTION OF PROSTATE;  Surgeon: Bjorn Pippin, MD;  Location: [facility];  Service: Urology;  Laterality: N/A;   WRIST GANGLION EXCISION Left 2001    Social History   Tobacco Use   Smoking status: Former    Packs/day: 0.75    Years: 14.00    Pack years: 10.50    Types: Cigarettes    Quit date: 05/27/1997    Years since  quitting: 23.7   Smokeless tobacco: Former    Types: Snuff    Quit date: 08/17/1997  Vaping Use   Vaping Use: Never used  Substance Use Topics   Alcohol use: Yes    Comment: occasional   Drug use: No    Family History  Problem Relation Age of Onset   Hypertension Father    Stroke Father 76   Lung cancer Mother         smoker   Aneurysm Mother    Diabetes Sister    Heart disease Brother 53        stent LAD 2012   Colon cancer Neg Hx    Pancreatic cancer Neg Hx    Rectal cancer Neg Hx    Stomach cancer Neg Hx     Allergies  Allergen Reactions   Lidocaine Anaphylaxis    REACTION: anaphylactic shock (also he was on Neomycin sulfate)   Neomycin Sulfate Anaphylaxis    REACTION: anaphylactic shock (also on Lidocaine  topically)   Sulfa Antibiotics Anaphylaxis   Sulfa Drugs Cross Reactors Anaphylaxis   Hctz [Hydrochlorothiazide] Other (See Comments)    Gout    Tramadol Other (See Comments)    05/26/13 urinary retention   Atrovent [Ipratropium] Other (See Comments)    Caused Urinary retention (this is the nasal spray)   Latex Itching and Rash    Skin irritation    Medication list has been reviewed and updated.  Current Outpatient Medications on File Prior to Visit  Medication Sig Dispense Refill   allopurinol (ZYLOPRIM) 300 MG tablet TAKE 1 TABLET BY MOUTH EVERY DAY 90 tablet 3   aspirin 81 MG tablet Take 1 tablet (81 mg total) by mouth daily. 90 tablet 1   cetirizine (ZYRTEC) 10 MG tablet Take 10 mg by mouth at bedtime.     clobetasol cream (TEMOVATE) 0.05 % Apply 1 application topically 2 (two) times daily. Apply 1 application topically 2 (two) times daily as needed. 300 gm is a 90 day supply 300 g 3   clonazePAM (KLONOPIN) 0.5 MG tablet Take 1 tablet (0.5 mg total) by mouth 2 (two) times daily as needed for anxiety. 20 tablet 1   Coenzyme Q10 (CO Q 10 PO) Take 1 capsule by mouth every morning. 500mg      cyclobenzaprine (FLEXERIL) 10 MG tablet Take 1 tablet (10 mg  total) by mouth 2 (two) times daily. 30 tablet 0   finasteride (PROSCAR) 5 MG tablet Take 1 tablet (5 mg total) by mouth daily. 30 tablet 0   fluocinonide (LIDEX) 0.05 % external solution Apply 1  application topically 2 (two) times daily. As needed for psoriasis.  300 ml is a 90 day supply 300 mL 3   fluocinonide cream (LIDEX) 0.05 % Apply 1 application topically 2 (two) times daily. As needed for psoriasis 60 g 2   fluticasone (FLONASE) 50 MCG/ACT nasal spray Place 2 sprays into both nostrils daily. 16 g 1   metoprolol succinate (TOPROL-XL) 100 MG 24 hr tablet Take 1 tablet (100 mg total) by mouth daily. 90 tablet 3   Multiple Vitamin (MULTIVITAMIN) capsule Take 1 capsule by mouth every morning.     nitroGLYCERIN (NITROSTAT) 0.3 MG SL tablet Place 1 tablet (0.3 mg total) under the tongue every 5 (five) minutes as needed for chest pain. 25 tablet 2   Omega-3 Fatty Acids (FISH OIL) 1000 MG CAPS Take 1,000 mg by mouth every morning.     predniSONE (DELTASONE) 20 MG tablet Take 40 mg by mouth daily for 3 days, then 20 mg daily for 3 days 9 tablet 0   rosuvastatin (CRESTOR) 20 MG tablet TAKE 1 TABLET BY MOUTH  DAILY 90 tablet 3   tamsulosin (FLOMAX) 0.4 MG CAPS capsule Take 1 capsule (0.4 mg total) by mouth daily after supper. 30 capsule 0   No current facility-administered medications on file prior to visit.    Review of Systems:  As per HPI- otherwise negative.   Physical Examination: Vitals:   02/07/21 1107  BP: 136/88  Pulse: 71  Resp: 18  Temp: 97.8 F (36.6 C)  SpO2: 97%   Vitals:   02/07/21 1107  Weight: 265 lb (120.2 kg)  Height: 5\' 8"  (1.727 m)   Body mass index is 40.29 kg/m. Ideal Body Weight: Weight in (lb) to have BMI = 25: 164.1  GEN: no acute distress. Obese, looks well  HEENT: Atraumatic, Normocephalic.  Ears and Nose: No external deformity. CV: RRR, No M/G/R. No JVD. No thrill. No extra heart sounds. PULM: CTA B, no wheezes, crackles, rhonchi. No retractions.  No resp. distress. No accessory muscle use. ABD: S, NT, ND, +BS. No rebound. No HSM. EXTR: No c/c/e PSYCH: Normally interactive. Conversant.  TL flexion is normal, extension a bit limited Normal BLE strength, sensation and SLR Normal DTR    Assessment and Plan: Acute right-sided low back pain without sciatica - Plan: DG Lumbar Spine Complete  Screening for tuberculosis - Plan: QuantiFERON-TB Gold Plus  Immunization due - Plan: Flu Vaccine QUAD 6+ mos PF IM (Fluarix Quad PF)  Anxiety - Plan: FLUoxetine (PROZAC) 20 MG capsule    Patient seen today with concern of back pain.  He has had issues with his back for a long time, had an MRI back in 2014.  He notes some radiation of pain down his right leg.  He is currently taking a course of prednisone.  Today we will obtain plain films of his spine, may plan to proceed to repeat MRI if symptoms persist  Flu shot given, chronic urine culture pending  Fluoxetine is working well for his anxiety, refilled today  This visit occurred during the SARS-CoV-2 public health emergency.  Safety protocols were in place, including screening questions prior to the visit, additional usage of staff PPE, and extensive cleaning of exam room while observing appropriate contact time as indicated for disinfecting solutions.   Signed Abbe AmsterdamJessica Aylissa Heinemann, MD  Received x-rays as below, message to patient  DG Lumbar Spine Complete  Result Date: 02/07/2021 CLINICAL DATA:  Low back pain radiating to the right side. EXAM: LUMBAR  SPINE - COMPLETE 4+ VIEW COMPARISON:  None. FINDINGS: 5 nonrib bearing lumbar-type vertebral bodies. Vertebral body heights are maintained. No acute fracture. No static listhesis. No spondylolysis. Degenerative disease with disc height loss throughout the thoracolumbar spine most severe at L5-S1. Bilateral facet arthropathy at L5-S1. SI joints are unremarkable. IMPRESSION: 1. Lumbar spine spondylosis as described above. 2. No acute osseous injury  of the lumbar spine. Electronically Signed   By: Elige Ko M.D.   On: 02/07/2021 12:34

## 2021-02-07 ENCOUNTER — Encounter: Payer: Self-pay | Admitting: Family Medicine

## 2021-02-07 ENCOUNTER — Other Ambulatory Visit: Payer: Self-pay

## 2021-02-07 ENCOUNTER — Ambulatory Visit: Payer: BC Managed Care – PPO | Admitting: Family Medicine

## 2021-02-07 ENCOUNTER — Ambulatory Visit (HOSPITAL_BASED_OUTPATIENT_CLINIC_OR_DEPARTMENT_OTHER)
Admission: RE | Admit: 2021-02-07 | Discharge: 2021-02-07 | Disposition: A | Discharge status: Home / Self Care | Payer: BC Managed Care – PPO | Source: Ambulatory Visit | Attending: Family Medicine | Admitting: Family Medicine

## 2021-02-07 VITALS — BP 136/88 | HR 71 | Temp 97.8°F | Resp 18 | Ht 68.0 in | Wt 265.0 lb

## 2021-02-07 DIAGNOSIS — Z111 Encounter for screening for respiratory tuberculosis: Secondary | ICD-10-CM | POA: Diagnosis not present

## 2021-02-07 DIAGNOSIS — F419 Anxiety disorder, unspecified: Secondary | ICD-10-CM

## 2021-02-07 DIAGNOSIS — M545 Low back pain, unspecified: Secondary | ICD-10-CM | POA: Diagnosis not present

## 2021-02-07 DIAGNOSIS — Z23 Encounter for immunization: Secondary | ICD-10-CM | POA: Diagnosis not present

## 2021-02-07 MED ORDER — FLUOXETINE HCL 20 MG PO CAPS: 20.0000 mg | ORAL_CAPSULE | Freq: Every day | ORAL | 3 refills | Status: DC

## 2021-02-10 ENCOUNTER — Encounter: Payer: Self-pay | Admitting: Family Medicine

## 2021-02-10 LAB — QUANTIFERON-TB GOLD PLUS
Mitogen-NIL: >10 IU/mL
NIL: 0.03 IU/mL
QuantiFERON-TB Gold Plus: NEGATIVE
TB1-NIL: <0 IU/mL
TB2-NIL: <0 IU/mL

## 2021-05-02 ENCOUNTER — Other Ambulatory Visit: Payer: Self-pay | Admitting: Cardiology

## 2021-05-02 DIAGNOSIS — E782 Mixed hyperlipidemia: Secondary | ICD-10-CM

## 2021-05-02 MED ORDER — ROSUVASTATIN CALCIUM 20 MG PO TABS: 20.0000 mg | ORAL_TABLET | Freq: Every day | ORAL | 0 refills | Status: DC

## 2021-05-02 NOTE — Telephone Encounter (Signed)
30 day supply sent of requested med with instructions to arrange appt for further refills

## 2021-05-15 ENCOUNTER — Encounter: Payer: Self-pay | Admitting: Family Medicine

## 2021-05-16 NOTE — Progress Notes (Deleted)
[facility] at [facility] [address], [address] [place], [place] [address] [phone]  Date:  05/24/2021   Name:  Charles [name]   DOB:  1960-09-29   MRN:  [id number]  PCP:  Pearline Cables, MD    Chief Complaint: No chief complaint on file.   History of Present Illness:  Charles [name] is a 60 y.o. very pleasant male patient who presents with the following:  Patient contacted me recently with concern of bilateral foot discomfort, suspicious for possible neuropathy History of prediabetes- also history of plaque psoriasis, Pre-diabetes, HTN, CAD  Lab Results  Component Value Date   HGBA1C 6.2 07/10/2020  Most recently seen by myself in September; at that time he also had concern of foot pain as well as back pain Lumbar films at that time showed degenerative change  COVID-19 booster Can offer pneumonia vaccine if he likes Patient Active Problem List   Diagnosis Date Noted   Pre-diabetes 06/28/2019   Sepsis secondary to UTI (HCC) 11/17/2017   UTI (urinary tract infection) 11/17/2017   Left calcaneal bursitis 12/14/2014   Pronation deformity of ankle, acquired 10/26/2014   Allergic reaction 10/14/2014   Achilles tendinosis 10/05/2014   OSA (obstructive sleep apnea) 08/18/2014   Gout 08/14/2014   Obesity 06/02/2013   Abnormal MRI, spine 05/09/2013   H/O urinary retention 05/09/2013   Psoriasis 05/08/2012   Coronary artery disease 08/03/2008   Hyperlipidemia 11/10/2007   History of gout 11/10/2007   Essential hypertension 11/10/2007   G E R D 01/06/2007    Past Medical History:  Diagnosis Date   BPH (benign prostatic hypertrophy) with urinary retention    CAD (coronary artery disease) 04/2008   successful PCI of the lesion in the proximal LAD using xience drug-eluting stent with improvement in central narrowing from 95% to 0%.  Dr. Juanda Chance   Foley catheter in place    Gout    per pt stable is of 08-18-2014   History of  acute bronchitis    recently 07-16-2014   History of sepsis    urosepsis--  08-13-2014 admission   Hyperlipidemia LDL goal <100    Hypertension    Multilevel degenerative disc disease    cervical and lumbar   OSA (obstructive sleep apnea)        Psoriasis    S/P drug eluting coronary stent placement    05-13-2008   DES X1 TO pLAD   Sigmoid diverticulosis     Past Surgical History:  Procedure Laterality Date   BUNIONECTOMY Right 2000   COLONOSCOPY WITH PROPOFOL  last one 10-06-2013   CORONARY ANGIOPLASTY WITH STENT PLACEMENT  05-13-2008    Dr Charlies Constable   PCI w/  DES x1 to  pLAD/  ef 65%   GREEN LIGHT LASER TURP (TRANSURETHRAL RESECTION OF PROSTATE N/A 08/23/2014   Procedure: GREEN LIGHT LASER TURP (TRANSURETHRAL RESECTION OF PROSTATE;  Surgeon: Bjorn Pippin, MD;  Location: [facility];  Service: Urology;  Laterality: N/A;   WRIST GANGLION EXCISION Left 2001    Social History   Tobacco Use   Smoking status: Former    Packs/day: 0.75    Years: 14.00    Pack years: 10.50    Types: Cigarettes    Quit date: 05/27/1997    Years since quitting: 23.9   Smokeless tobacco: Former    Types: Snuff    Quit date: 08/17/1997  Vaping Use   Vaping Use: Never used  Substance Use Topics   Alcohol use: Yes    Comment: occasional   Drug use: No    Family History  Problem Relation Age of Onset   Hypertension Father    Stroke Father 13   Lung cancer Mother         smoker   Aneurysm Mother    Diabetes Sister    Heart disease Brother 36        stent LAD 2012   Colon cancer Neg Hx    Pancreatic cancer Neg Hx    Rectal cancer Neg Hx    Stomach cancer Neg Hx     Allergies  Allergen Reactions   Lidocaine Anaphylaxis    REACTION: anaphylactic shock (also he was on Neomycin sulfate)   Neomycin Sulfate Anaphylaxis    REACTION: anaphylactic shock (also on Lidocaine  topically)   Sulfa Antibiotics Anaphylaxis   Sulfa Drugs Cross Reactors Anaphylaxis   Hctz  [Hydrochlorothiazide] Other (See Comments)    Gout    Tramadol Other (See Comments)    05/26/13 urinary retention   Atrovent [Ipratropium] Other (See Comments)    Caused Urinary retention (this is the nasal spray)   Latex Itching and Rash    Skin irritation    Medication list has been reviewed and updated.  Current Outpatient Medications on File Prior to Visit  Medication Sig Dispense Refill   allopurinol (ZYLOPRIM) 300 MG tablet TAKE 1 TABLET BY MOUTH EVERY DAY 90 tablet 3   aspirin 81 MG tablet Take 1 tablet (81 mg total) by mouth daily. 90 tablet 1   cetirizine (ZYRTEC) 10 MG tablet Take 10 mg by mouth at bedtime.     clobetasol cream (TEMOVATE) 0.05 % Apply 1 application topically 2 (two) times daily. Apply 1 application topically 2 (two) times daily as needed. 300 gm is a 90 day supply 300 g 3   clonazePAM (KLONOPIN) 0.5 MG tablet Take 1 tablet (0.5 mg total) by mouth 2 (two) times daily as needed for anxiety. 20 tablet 1   Coenzyme Q10 (CO Q 10 PO) Take 1 capsule by mouth every morning. 500mg      cyclobenzaprine (FLEXERIL) 10 MG tablet Take 1 tablet (10 mg total) by mouth 2 (two) times daily. 30 tablet 0   finasteride (PROSCAR) 5 MG tablet Take 1 tablet (5 mg total) by mouth daily. 30 tablet 0   fluocinonide (LIDEX) 0.05 % external solution Apply 1 application topically 2 (two) times daily. As needed for psoriasis.  300 ml is a 90 day supply 300 mL 3   fluocinonide cream (LIDEX) 0.05 % Apply 1 application topically 2 (two) times daily. As needed for psoriasis 60 g 2   FLUoxetine (PROZAC) 20 MG capsule Take 1 capsule (20 mg total) by mouth daily. 90 capsule 3   fluticasone (FLONASE) 50 MCG/ACT nasal spray Place 2 sprays into both nostrils daily. 16 g 1   metoprolol succinate (TOPROL-XL) 100 MG 24 hr tablet Take 1 tablet (100 mg total) by mouth daily. 90 tablet 3   Multiple Vitamin (MULTIVITAMIN) capsule Take 1 capsule by mouth every morning.     nitroGLYCERIN (NITROSTAT) 0.3 MG SL  tablet Place 1 tablet (0.3 mg total) under the tongue every 5 (five) minutes as needed for chest pain. 25 tablet 2   Omega-3 Fatty Acids (FISH OIL) 1000 MG CAPS Take 1,000 mg by mouth every morning.     predniSONE (DELTASONE) 20 MG tablet Take 40 mg by mouth daily for 3 days, then  20 mg daily for 3 days 9 tablet 0   rosuvastatin (CRESTOR) 20 MG tablet Take 1 tablet (20 mg total) by mouth daily. 30 tablet 0   tamsulosin (FLOMAX) 0.4 MG CAPS capsule Take 1 capsule (0.4 mg total) by mouth daily after supper. 30 capsule 0   No current facility-administered medications on file prior to visit.    Review of Systems:  As per HPI- otherwise negative.   Physical Examination: There were no vitals filed for this visit. There were no vitals filed for this visit. There is no height or weight on file to calculate BMI. Ideal Body Weight:    GEN: no acute distress. HEENT: Atraumatic, Normocephalic.  Ears and Nose: No external deformity. CV: RRR, No M/G/R. No JVD. No thrill. No extra heart sounds. PULM: CTA B, no wheezes, crackles, rhonchi. No retractions. No resp. distress. No accessory muscle use. ABD: S, NT, ND, +BS. No rebound. No HSM. EXTR: No c/c/e PSYCH: Normally interactive. Conversant.    Assessment and Plan: ***  Signed Abbe Amsterdam, MD

## 2021-05-24 ENCOUNTER — Ambulatory Visit: Payer: BC Managed Care – PPO | Admitting: Family Medicine

## 2021-05-25 ENCOUNTER — Encounter: Payer: Self-pay | Admitting: Family Medicine

## 2021-05-25 ENCOUNTER — Ambulatory Visit: Payer: BC Managed Care – PPO | Admitting: Family Medicine

## 2021-05-25 VITALS — BP 122/80 | HR 73 | Temp 98.1°F | Ht 68.0 in | Wt 271.0 lb

## 2021-05-25 DIAGNOSIS — G629 Polyneuropathy, unspecified: Secondary | ICD-10-CM | POA: Diagnosis not present

## 2021-05-25 LAB — COMPREHENSIVE METABOLIC PANEL
ALT: 28 U/L (ref 0–53)
AST: 25 U/L (ref 0–37)
Albumin: 4.3 g/dL (ref 3.5–5.2)
Alkaline Phosphatase: 52 U/L (ref 39–117)
BUN: 15 mg/dL (ref 6–23)
CO2: 28 mEq/L (ref 19–32)
Calcium: 8.9 mg/dL (ref 8.4–10.5)
Chloride: 101 mEq/L (ref 96–112)
Creatinine, Ser: 0.96 mg/dL (ref 0.40–1.50)
GFR: 84.75 mL/min (ref >60.00)
Glucose, Bld: 139 mg/dL — ABNORMAL HIGH (ref 70–99)
Potassium: 4.3 mEq/L (ref 3.5–5.1)
Sodium: 138 mEq/L (ref 135–145)
Total Bilirubin: 0.7 mg/dL (ref 0.2–1.2)
Total Protein: 6.8 g/dL (ref 6.0–8.3)

## 2021-05-25 LAB — CBC
HCT: 42.5 % (ref 39.0–52.0)
Hemoglobin: 13.8 g/dL (ref 13.0–17.0)
MCHC: 32.6 g/dL (ref 30.0–36.0)
MCV: 87.1 fl (ref 78.0–100.0)
Platelets: 226 10*3/uL (ref 150.0–400.0)
RBC: 4.88 Mil/uL (ref 4.22–5.81)
RDW: 14 % (ref 11.5–15.5)
WBC: 6.4 10*3/uL (ref 4.0–10.5)

## 2021-05-25 LAB — HEMOGLOBIN A1C: Hgb A1c MFr Bld: 6.4 % (ref 4.6–6.5)

## 2021-05-25 LAB — TSH: TSH: 2.05 u[IU]/mL (ref 0.35–5.50)

## 2021-05-25 LAB — VITAMIN B12: Vitamin B-12: 280 pg/mL (ref 211–911)

## 2021-05-25 MED ORDER — GABAPENTIN 300 MG PO CAPS: 300.0000 mg | ORAL_CAPSULE | Freq: Three times a day (TID) | ORAL | 3 refills | Status: DC

## 2021-05-25 NOTE — Progress Notes (Signed)
Musculoskeletal Exam  Patient: Charles [name] DOB: 25-Aug-1960  DOS: 05/25/2021  SUBJECTIVE:  Chief Complaint:   Chief Complaint  Patient presents with   Foot Pain    Charles [name] is a 60 y.o.  male for evaluation and treatment of b/l foot pain.   Onset:  3 months ago. No inj or change in activity.  Location: all around toes and top of feet Character:  burning  Progression of issue:  unchanged, but better if he is off of his feet Associated symptoms: constant No bruising, swelling, balance issues, redness.  Treatment: to date has been massage and soaking feet.   Tried different shoes and inserts that did not help.  Neurovascular symptoms: no  Past Medical History:  Diagnosis Date   BPH (benign prostatic hypertrophy) with urinary retention    CAD (coronary artery disease) 04/2008   successful PCI of the lesion in the proximal LAD using xience drug-eluting stent with improvement in central narrowing from 95% to 0%.  Dr. Juanda Chance   Foley catheter in place    Gout    per pt stable is of 08-18-2014   History of acute bronchitis    recently 07-16-2014   History of sepsis    urosepsis--  08-13-2014 admission   Hyperlipidemia LDL goal <100    Hypertension    Multilevel degenerative disc disease    cervical and lumbar   OSA (obstructive sleep apnea)        Psoriasis    S/P drug eluting coronary stent placement    05-13-2008   DES X1 TO pLAD   Sigmoid diverticulosis     Objective: VITAL SIGNS: BP 122/80    Pulse 73    Temp 98.1 F (36.7 C) (Oral)    Ht 5\' 8"  (1.727 m)    Wt 271 lb (122.9 kg)    SpO2 93%    BMI 41.21 kg/m  Constitutional: Well formed, well developed. No acute distress. Thorax & Lungs: No accessory muscle use Musculoskeletal: feet.   Tenderness to palpation: no Deformity: no Ecchymosis: no Tests positive: none Tests negative: squeeze Neurologic: Normal sensory function. Sensation intact to pinprick b/l feet Psychiatric: Normal mood. Age appropriate  judgment and insight. Alert & oriented x 3.    Assessment:  Neuropathy - Plan: CBC, Comprehensive metabolic panel, Hemoglobin A1c, TSH, B12, gabapentin (NEURONTIN) 300 MG capsule  Plan: I think this is nerve related. Chronic, new to me, uncertain prog. Ck above. Trial gabapentin. Would consider changing SSRI to SNRI or TCA if no improvement. Stretches/exercises, heat, ice, Tylenol.  F/u in 3 weeks w Dr. to reck. The patient voiced understanding and agreement to the plan.   Charles Lager Huntington, DO 05/25/21  9:24 AM

## 2021-05-25 NOTE — Patient Instructions (Signed)
Give [place] 2-3 business days to get the results of your labs back.   Keep the diet clean and stay active.  Ice/cold pack over area for 10-15 min twice daily.  Heat (pad or rice pillow in microwave) over affected area, 10-15 minutes twice daily.   OK to take Tylenol 1000 mg (2 extra strength tabs) or 975 mg (3 regular strength tabs) every 6 hours as needed.  Take the gabapentin at night first as this can make you drowsy.   Let us know if you need anything.

## 2021-05-26 ENCOUNTER — Other Ambulatory Visit: Payer: Self-pay | Admitting: Family Medicine

## 2021-05-26 DIAGNOSIS — Z8739 Personal history of other diseases of the musculoskeletal system and connective tissue: Secondary | ICD-10-CM

## 2021-06-04 ENCOUNTER — Encounter: Payer: Self-pay | Admitting: Family Medicine

## 2021-06-04 DIAGNOSIS — E782 Mixed hyperlipidemia: Secondary | ICD-10-CM

## 2021-06-04 MED ORDER — ROSUVASTATIN CALCIUM 20 MG PO TABS: 20.0000 mg | ORAL_TABLET | Freq: Every day | ORAL | 3 refills | Status: DC

## 2021-06-05 ENCOUNTER — Encounter: Payer: Self-pay | Admitting: Family Medicine

## 2021-06-05 ENCOUNTER — Other Ambulatory Visit: Payer: Self-pay | Admitting: Family Medicine

## 2021-06-05 MED ORDER — PREGABALIN 75 MG PO CAPS: 75.0000 mg | ORAL_CAPSULE | Freq: Two times a day (BID) | ORAL | 0 refills | Status: DC

## 2021-06-11 NOTE — Progress Notes (Addendum)
[facility] [address], [address] [place], [place] [address] [phone] [phone]  Date:  06/18/2021   Name:  Charles BoucheRobert [name]   DOB:  [birth year]   MRN:  [id number]  PCP:  Pearline Cablesopland, Leesha Veno C, MD    Chief Complaint: 3 week follow up (Seen by [facility] 3 weeks ago for Neuropathy- started on trial of Gabapentin, but then switched to Lyrica. He says this helps some. He asks if this will dull the sensation? Or is it supposed to make it go away completley? )   History of Present Illness:  Charles BoucheRobert [name] is a 61 y.o. 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 Carmelia RollerWendling for concern of bilateral foot pain Looking back he has noted foot pain since 9/22; bother him more when he is on his feet Sx are about the same in both feet He notes a feeling of discomfort deep in his distal feet May feel hot, cold, "like nerve pain"  Felt likely neuropathy, started on gabapentin-which was then changed to lyrica about 10 days is as he did not notice any change He thinks the lyrica is helping a bit but certainly not resolving the sx  It feels like his sx are worse at night- perhaps because he is not distracted Sitting for 45 minutes or so may help - but it takes some time for the sx to resolve   Lab Results  Component Value Date   HGBA1C 6.4 05/25/2021   B12 level recently was low normal  Patient Active Problem List   Diagnosis Date Noted   Pre-diabetes 06/28/2019   Sepsis secondary to UTI (HCC) 11/17/2017   UTI (urinary tract infection) 11/17/2017   Left calcaneal bursitis 12/14/2014   Pronation deformity of ankle, acquired 10/26/2014   Allergic reaction 10/14/2014   Achilles tendinosis  10/05/2014   OSA (obstructive sleep apnea) 08/18/2014   Gout 08/14/2014   Obesity 06/02/2013   Abnormal MRI, spine 05/09/2013   H/O urinary retention 05/09/2013   Psoriasis 05/08/2012   Coronary artery disease 08/03/2008   Hyperlipidemia 11/10/2007   History of gout 11/10/2007   Essential hypertension 11/10/2007   G E R D 01/06/2007    Past Medical History:  Diagnosis Date   BPH (benign prostatic hypertrophy) with urinary retention    CAD (coronary artery disease) 04/2008   successful PCI of the lesion in the proximal LAD using xience drug-eluting stent with improvement in central narrowing from 95% to 0%.  Dr. Juanda ChanceBrodie   Foley catheter in place    Gout    per pt stable is of 08-18-2014   History of acute bronchitis    recently 07-16-2014   History of sepsis    urosepsis--  08-13-2014 admission   Hyperlipidemia LDL goal <100    Hypertension    Multilevel degenerative disc disease    cervical and lumbar   OSA (obstructive sleep apnea)        Psoriasis    S/P drug eluting coronary stent placement    05-13-2008   DES X1 TO pLAD   Sigmoid diverticulosis     Past Surgical History:  Procedure Laterality Date   BUNIONECTOMY Right 2000   COLONOSCOPY WITH PROPOFOL  last one 10-06-2013  CORONARY ANGIOPLASTY WITH STENT PLACEMENT  05-13-2008    Dr Charlies Constable   PCI w/  DES x1 to  pLAD/  ef 65%   GREEN LIGHT LASER TURP (TRANSURETHRAL RESECTION OF PROSTATE N/A 08/23/2014   Procedure: GREEN LIGHT LASER TURP (TRANSURETHRAL RESECTION OF PROSTATE;  Surgeon: Bjorn Pippin, MD;  Location: [facility];  Service: Urology;  Laterality: N/A;   WRIST GANGLION EXCISION Left 2001    Social History   Tobacco Use   Smoking status: Former    Packs/day: 0.75    Years: 14.00    Pack years: 10.50    Types: Cigarettes    Quit date: 05/27/1997    Years since quitting: 24.0   Smokeless tobacco: Former    Types: Snuff    Quit date: 08/17/1997  Vaping Use   Vaping Use: Never used   Substance Use Topics   Alcohol use: Yes    Comment: occasional   Drug use: No    Family History  Problem Relation Age of Onset   Hypertension Father    Stroke Father 34   Lung cancer Mother         smoker   Aneurysm Mother    Diabetes Sister    Heart disease Brother 49        stent LAD 2012   Colon cancer Neg Hx    Pancreatic cancer Neg Hx    Rectal cancer Neg Hx    Stomach cancer Neg Hx     Allergies  Allergen Reactions   Lidocaine Anaphylaxis    REACTION: anaphylactic shock (also he was on Neomycin sulfate)   Neomycin Sulfate Anaphylaxis    REACTION: anaphylactic shock (also on Lidocaine  topically)   Sulfa Antibiotics Anaphylaxis   Sulfa Drugs Cross Reactors Anaphylaxis   Hctz [Hydrochlorothiazide] Other (See Comments)    Gout    Tramadol Other (See Comments)    05/26/13 urinary retention   Atrovent [Ipratropium] Other (See Comments)    Caused Urinary retention (this is the nasal spray)   Latex Itching and Rash    Skin irritation    Medication list has been reviewed and updated.  Current Outpatient Medications on File Prior to Visit  Medication Sig Dispense Refill   allopurinol (ZYLOPRIM) 300 MG tablet TAKE 1 TABLET BY MOUTH  DAILY 90 tablet 3   aspirin 81 MG tablet Take 1 tablet (81 mg total) by mouth daily. 90 tablet 1   cetirizine (ZYRTEC) 10 MG tablet Take 10 mg by mouth at bedtime.     clobetasol cream (TEMOVATE) 0.05 % Apply 1 application topically 2 (two) times daily. Apply 1 application topically 2 (two) times daily as needed. 300 gm is a 90 day supply 300 g 3   clonazePAM (KLONOPIN) 0.5 MG tablet Take 1 tablet (0.5 mg total) by mouth 2 (two) times daily as needed for anxiety. 20 tablet 1   Coenzyme Q10 (CO Q 10 PO) Take 1 capsule by mouth every morning. 500mg      finasteride (PROSCAR) 5 MG tablet Take 1 tablet (5 mg total) by mouth daily. 30 tablet 0   fluocinonide (LIDEX) 0.05 % external solution Apply 1 application topically 2 (two) times daily. As  needed for psoriasis.  300 ml is a 90 day supply 300 mL 3   fluocinonide cream (LIDEX) 0.05 % Apply 1 application topically 2 (two) times daily. As needed for psoriasis 60 g 2   FLUoxetine (PROZAC) 20 MG capsule Take 1 capsule (20 mg total) by  mouth daily. 90 capsule 3   fluticasone (FLONASE) 50 MCG/ACT nasal spray Place 2 sprays into both nostrils daily. 16 g 1   metoprolol succinate (TOPROL-XL) 100 MG 24 hr tablet Take 1 tablet (100 mg total) by mouth daily. 90 tablet 3   Multiple Vitamin (MULTIVITAMIN) capsule Take 1 capsule by mouth every morning.     nitroGLYCERIN (NITROSTAT) 0.3 MG SL tablet Place 1 tablet (0.3 mg total) under the tongue every 5 (five) minutes as needed for chest pain. 25 tablet 2   Omega-3 Fatty Acids (FISH OIL) 1000 MG CAPS Take 1,000 mg by mouth every morning.     pregabalin (LYRICA) 75 MG capsule Take 1 capsule (75 mg total) by mouth 2 (two) times daily. 60 capsule 0   rosuvastatin (CRESTOR) 20 MG tablet Take 1 tablet (20 mg total) by mouth daily. 90 tablet 3   tamsulosin (FLOMAX) 0.4 MG CAPS capsule Take 1 capsule (0.4 mg total) by mouth daily after supper. 30 capsule 0   No current facility-administered medications on file prior to visit.    Review of Systems:  As per HPI- otherwise negative.   Physical Examination: Vitals:   06/18/21 0942  BP: 130/74  Pulse: 70  Resp: 18  Temp: 98 F (36.7 C)  SpO2: 98%   Vitals:   06/18/21 0942  Weight: 270 lb 6.4 oz (122.7 kg)  Height: 5\' 8"  (1.727 m)   Body mass index is 41.11 kg/m. Ideal Body Weight: Weight in (lb) to have BMI = 25: 164.1  GEN: no acute distress.  Obese, otherwise looks well HEENT: Atraumatic, Normocephalic.  Ears and Nose: No external deformity. CV: RRR, No M/G/R. No JVD. No thrill. No extra heart sounds. PULM: CTA B, no wheezes, crackles, rhonchi. No retractions. No resp. distress. No accessory muscle use. EXTR: No c/c/e PSYCH: Normally interactive. Conversant.  He has some bruising  and a few abrasions on his arms from his work which is physical.  Assessment and Plan: Neuropathy - Plan: Ferritin, Folate  Mixed hyperlipidemia  Essential hypertension  Prediabetes  Seen today for follow-up of neuropathy.  Patient tried gabapentin, currently taking Lyrica 75 twice daily.  He has noted improvement of his symptoms, though not resolution.  He has been on Lyrica for about 2 weeks.  We discussed options.  For the time being he would like to continue using Lyrica and try adding a B12 supplement.  Explained that his vitamin B12 is low normal which could contribute to neuropathy symptoms.  We will also check his ferritin and folate today  He will update me in a couple of weeks, will get increased dose of Lyrica at that time if needed  Blood pressures under good control, continue current medication  He plans to see me for a physical exam in 1 to 2 months  Signed , MD  Received labs as below, message to patient  Results for orders placed or performed in visit on 06/18/21  Ferritin  Result Value Ref Range   Ferritin 7.8 (L) 22.0 - 322.0 ng/mL  Folate  Result Value Ref Range   Folate >24.2 >5.9 ng/mL

## 2021-06-18 ENCOUNTER — Ambulatory Visit: Payer: BC Managed Care – PPO | Admitting: Family Medicine

## 2021-06-18 ENCOUNTER — Encounter: Payer: Self-pay | Admitting: Family Medicine

## 2021-06-18 VITALS — BP 130/74 | HR 70 | Temp 98.0°F | Resp 18 | Ht 68.0 in | Wt 270.4 lb

## 2021-06-18 DIAGNOSIS — E782 Mixed hyperlipidemia: Secondary | ICD-10-CM | POA: Diagnosis not present

## 2021-06-18 DIAGNOSIS — I10 Essential (primary) hypertension: Secondary | ICD-10-CM

## 2021-06-18 DIAGNOSIS — R7303 Prediabetes: Secondary | ICD-10-CM

## 2021-06-18 DIAGNOSIS — G629 Polyneuropathy, unspecified: Secondary | ICD-10-CM

## 2021-06-18 LAB — FOLATE: Folate: >24.2 ng/mL (ref >5.9)

## 2021-06-18 LAB — FERRITIN: Ferritin: 7.8 ng/mL — ABNORMAL LOW (ref 22.0–322.0)

## 2021-06-18 NOTE — Patient Instructions (Addendum)
I would suggest taking B12- 1000 mcg otc daily.  This may help with your neuropathy symptoms  We will also check your iron and folate levels today Let me know how things are going in about  2 weeks; if you do not see improvement with adding B12 we can increase your lyrica dose

## 2021-07-06 MED ORDER — PREGABALIN 75 MG PO CAPS: 75.0000 mg | ORAL_CAPSULE | Freq: Two times a day (BID) | ORAL | 1 refills | Status: DC

## 2021-07-09 ENCOUNTER — Telehealth: Payer: Self-pay | Admitting: Family Medicine

## 2021-07-09 NOTE — Telephone Encounter (Signed)
Pt scheduled with laura tomorrow

## 2021-07-09 NOTE — Telephone Encounter (Signed)
Patient called stating there has been a rash on his left arm that has progressively gotten worse. He believes that it might be due to a medication he had just started taking. He was offered an OV but he wanted to see what [facility]land would recommend first. Please advise.

## 2021-07-09 NOTE — Telephone Encounter (Signed)
Please advise 

## 2021-07-10 ENCOUNTER — Ambulatory Visit: Payer: BC Managed Care – PPO | Admitting: Family

## 2021-07-10 ENCOUNTER — Other Ambulatory Visit (HOSPITAL_BASED_OUTPATIENT_CLINIC_OR_DEPARTMENT_OTHER): Payer: Self-pay

## 2021-07-10 VITALS — BP 122/80 | HR 74 | Temp 98.4°F | Resp 18 | Ht 68.0 in | Wt 269.8 lb

## 2021-07-10 DIAGNOSIS — L409 Psoriasis, unspecified: Secondary | ICD-10-CM

## 2021-07-10 DIAGNOSIS — L989 Disorder of the skin and subcutaneous tissue, unspecified: Secondary | ICD-10-CM | POA: Diagnosis not present

## 2021-07-10 DIAGNOSIS — L309 Dermatitis, unspecified: Secondary | ICD-10-CM | POA: Diagnosis not present

## 2021-07-10 MED ORDER — PREDNISONE 20 MG PO TABS
ORAL_TABLET | ORAL | 0 refills | Status: DC
  Filled 2021-07-10: qty 9, 7d supply, fill #0

## 2021-07-10 MED ORDER — PREDNISONE 20 MG PO TABS: ORAL_TABLET | ORAL | 0 refills | Status: DC

## 2021-07-10 NOTE — Progress Notes (Signed)
Charles [name] is a 61 y.o. male with the following history as recorded in Epic[employer]:  Patient Active Problem List   Diagnosis Date Noted   Pre-diabetes 06/28/2019   Sepsis secondary to UTI (HCC) 11/17/2017   UTI (urinary tract infection) 11/17/2017   Left calcaneal bursitis 12/14/2014   Pronation deformity of ankle, acquired 10/26/2014   Allergic reaction 10/14/2014   Achilles tendinosis 10/05/2014   OSA (obstructive sleep apnea) 08/18/2014   Gout 08/14/2014   Obesity 06/02/2013   Abnormal MRI, spine 05/09/2013   H/O urinary retention 05/09/2013   Psoriasis 05/08/2012   Coronary artery disease 08/03/2008   Hyperlipidemia 11/10/2007   History of gout 11/10/2007   Essential hypertension 11/10/2007   G E R D 01/06/2007    Current Outpatient Medications  Medication Sig Dispense Refill   allopurinol (ZYLOPRIM) 300 MG tablet TAKE 1 TABLET BY MOUTH  DAILY 90 tablet 3   aspirin 81 MG tablet Take 1 tablet (81 mg total) by mouth daily. 90 tablet 1   cetirizine (ZYRTEC) 10 MG tablet Take 10 mg by mouth at bedtime.     clobetasol cream (TEMOVATE) 0.05 % Apply 1 application topically 2 (two) times daily. Apply 1 application topically 2 (two) times daily as needed. 300 gm is a 90 day supply 300 g 3   clonazePAM (KLONOPIN) 0.5 MG tablet Take 1 tablet (0.5 mg total) by mouth 2 (two) times daily as needed for anxiety. 20 tablet 1   Coenzyme Q10 (CO Q 10 PO) Take 1 capsule by mouth every morning. 500mg      finasteride (PROSCAR) 5 MG tablet Take 1 tablet (5 mg total) by mouth daily. 30 tablet 0   fluocinonide (LIDEX) 0.05 % external solution Apply 1 application topically 2 (two) times daily. As needed for psoriasis.  300 ml is a 90 day supply 300 mL 3   fluocinonide cream (LIDEX) 0.05 % Apply 1 application topically 2 (two) times daily. As needed for psoriasis 60 g 2   FLUoxetine (PROZAC) 20 MG capsule Take 1 capsule (20 mg total) by mouth daily. 90 capsule 3   fluticasone (FLONASE) 50 MCG/ACT  nasal spray Place 2 sprays into both nostrils daily. 16 g 1   metoprolol succinate (TOPROL-XL) 100 MG 24 hr tablet Take 1 tablet (100 mg total) by mouth daily. 90 tablet 3   Multiple Vitamin (MULTIVITAMIN) capsule Take 1 capsule by mouth every morning.     nitroGLYCERIN (NITROSTAT) 0.3 MG SL tablet Place 1 tablet (0.3 mg total) under the tongue every 5 (five) minutes as needed for chest pain. 25 tablet 2   Omega-3 Fatty Acids (FISH OIL) 1000 MG CAPS Take 1,000 mg by mouth every morning.     pregabalin (LYRICA) 75 MG capsule Take 1 capsule (75 mg total) by mouth 2 (two) times daily. 180 capsule 1   rosuvastatin (CRESTOR) 20 MG tablet Take 1 tablet (20 mg total) by mouth daily. 90 tablet 3   tamsulosin (FLOMAX) 0.4 MG CAPS capsule Take 1 capsule (0.4 mg total) by mouth daily after supper. 30 capsule 0   predniSONE (DELTASONE) 20 MG tablet Take 2 tablets by mouth daily x 2 days; then decrease to 1 tablet daily x 5 days 9 tablet 0   No current facility-administered medications for this visit.    Allergies: Lidocaine, Neomycin sulfate, Sulfa antibiotics, Sulfa drugs cross reactors, Hctz [hydrochlorothiazide], Tramadol, Atrovent [ipratropium], and Latex  Past Medical History:  Diagnosis Date   BPH (benign prostatic hypertrophy) with urinary retention  CAD (coronary artery disease) 04/2008   successful PCI of the lesion in the proximal LAD using xience drug-eluting stent with improvement in central narrowing from 95% to 0%.  Dr. Juanda Chance   Foley catheter in place    Gout    per pt stable is of 08-18-2014   History of acute bronchitis    recently 07-16-2014   History of sepsis    urosepsis--  08-13-2014 admission   Hyperlipidemia LDL goal <100    Hypertension    Multilevel degenerative disc disease    cervical and lumbar   OSA (obstructive sleep apnea)        Psoriasis    S/P drug eluting coronary stent placement    05-13-2008   DES X1 TO pLAD   Sigmoid diverticulosis     Past Surgical  History:  Procedure Laterality Date   BUNIONECTOMY Right 2000   COLONOSCOPY WITH PROPOFOL  last one 10-06-2013   CORONARY ANGIOPLASTY WITH STENT PLACEMENT  05-13-2008    Dr Charlies Constable   PCI w/  DES x1 to  pLAD/  ef 65%   GREEN LIGHT LASER TURP (TRANSURETHRAL RESECTION OF PROSTATE N/A 08/23/2014   Procedure: GREEN LIGHT LASER TURP (TRANSURETHRAL RESECTION OF PROSTATE;  Surgeon: Bjorn Pippin, MD;  Location: [facility];  Service: Urology;  Laterality: N/A;   WRIST GANGLION EXCISION Left 2001    Family History  Problem Relation Age of Onset   Hypertension Father    Stroke Father 22   Lung cancer Mother         smoker   Aneurysm Mother    Diabetes Sister    Heart disease Brother 34        stent LAD 2012   Colon cancer Neg Hx    Pancreatic cancer Neg Hx    Rectal cancer Neg Hx    Stomach cancer Neg Hx     Social History   Tobacco Use   Smoking status: Former    Packs/day: 0.75    Years: 14.00    Pack years: 10.50    Types: Cigarettes    Quit date: 05/27/1997    Years since quitting: 24.1   Smokeless tobacco: Former    Types: Snuff    Quit date: 08/17/1997  Substance Use Topics   Alcohol use: Yes    Comment: occasional    Subjective:   Concerned about rash localized on lower left extremity; symptoms x 2-3 weeks; was questioning if related to start of Lyrica approximately 1 month ago; no other rash symptoms on any other part of the body;  Does have extensive psoriasis involvement; Does stay in hotels repeatedly so admits he could have recently been exposed to new detergent/ soap/ irritant.     Objective:  Vitals:   07/10/21 0824  BP: 122/80  Pulse: 74  Resp: 18  Temp: 98.4 F (36.9 C)  TempSrc: Oral  SpO2: 97%  Weight: 269 lb 12.8 oz (122.4 kg)  Height: 5\' 8"  (1.727 m)    General: Well developed, well nourished, in no acute distress  Skin : Warm and dry. Localized area of erythema over left lower extremity; scabbed areas noted;  Head: Normocephalic  and atraumatic  Lungs: Respirations unlabored;  Neurologic: Alert and oriented; speech intact; face symmetrical; moves all extremities well; CNII-XII intact without focal deficit   Assessment:  1. Dermatitis   2. Psoriasis   3. Skin lesion     Plan:  Low suspicion that localized area of concern is related  to Lyrica; will treat for contact dermatitis with oral steroid and he has topical steroid cream he can apply; follow up worse, no better. Refer to dermatology to discuss systemic treatment options; ? Wart vs basal cell on right hand ( dorsal region)- will have dermatology biopsy;   This visit occurred during the SARS-CoV-2 public health emergency.  Safety protocols were in place, including screening questions prior to the visit, additional usage of staff PPE, and extensive cleaning of exam room while observing appropriate contact time as indicated for disinfecting solutions.   Time spent with patient 30 minutes  No follow-ups on file.  Orders Placed This Encounter  Procedures   Ambulatory referral to Dermatology    Referral Priority:   Routine    Referral Type:   Consultation    Referral Reason:   Specialty Services Required    Requested Specialty:   Dermatology    Number of Visits Requested:   1    Requested Prescriptions   Signed Prescriptions Disp Refills   predniSONE (DELTASONE) 20 MG tablet 9 tablet 0    Sig: Take 2 tablets by mouth daily x 2 days; then decrease to 1 tablet daily x 5 days

## 2021-07-16 ENCOUNTER — Encounter: Payer: Self-pay | Admitting: Family Medicine

## 2021-08-24 DIAGNOSIS — R310 Gross hematuria: Secondary | ICD-10-CM | POA: Diagnosis not present

## 2021-08-24 DIAGNOSIS — R972 Elevated prostate specific antigen [PSA]: Secondary | ICD-10-CM | POA: Diagnosis not present

## 2021-08-24 DIAGNOSIS — R339 Retention of urine, unspecified: Secondary | ICD-10-CM | POA: Diagnosis not present

## 2021-08-27 ENCOUNTER — Encounter: Payer: Self-pay | Admitting: Family Medicine

## 2021-08-27 DIAGNOSIS — E538 Deficiency of other specified B group vitamins: Secondary | ICD-10-CM

## 2021-08-27 DIAGNOSIS — L400 Psoriasis vulgaris: Secondary | ICD-10-CM | POA: Diagnosis not present

## 2021-08-27 DIAGNOSIS — E611 Iron deficiency: Secondary | ICD-10-CM

## 2021-08-27 DIAGNOSIS — L404 Guttate psoriasis: Secondary | ICD-10-CM | POA: Diagnosis not present

## 2021-08-27 DIAGNOSIS — L821 Other seborrheic keratosis: Secondary | ICD-10-CM | POA: Diagnosis not present

## 2021-08-27 DIAGNOSIS — G629 Polyneuropathy, unspecified: Secondary | ICD-10-CM

## 2021-08-27 MED ORDER — PREGABALIN 150 MG PO CAPS: 150.0000 mg | ORAL_CAPSULE | Freq: Two times a day (BID) | ORAL | 3 refills | Status: DC

## 2021-08-27 NOTE — Addendum Note (Signed)
Addended by: Abbe Amsterdam C on: 08/27/2021 03:02 PM ? ? Modules accepted: Orders ? ?

## 2021-09-14 ENCOUNTER — Telehealth: Payer: Self-pay | Admitting: Family Medicine

## 2021-09-14 NOTE — Telephone Encounter (Signed)
Patient called stated he had some knee pain in the inside of leg. Has been minor but last week has worsened. Told him we can get him in with Copland next Wednesday or another provider before then. Patient would like to see Copland. I told him I could send message back to see if we can do anything for him. ?

## 2021-09-14 NOTE — Telephone Encounter (Signed)
Called pt to schedule for Wednesday.  ?

## 2021-09-18 ENCOUNTER — Ambulatory Visit: Payer: BC Managed Care – PPO | Admitting: Family

## 2021-09-18 ENCOUNTER — Encounter: Payer: Self-pay | Admitting: Family

## 2021-09-18 ENCOUNTER — Other Ambulatory Visit: Payer: Self-pay | Admitting: Family

## 2021-09-18 VITALS — BP 138/80 | HR 69 | Temp 98.4°F | Ht 68.0 in | Wt 271.6 lb

## 2021-09-18 DIAGNOSIS — M25562 Pain in left knee: Secondary | ICD-10-CM | POA: Diagnosis not present

## 2021-09-18 NOTE — Progress Notes (Signed)
?Charles [name] is a 61 y.o. male with the following history as recorded in EpicCare:  ?Patient Active Problem List  ? Diagnosis Date Noted  ? Pre-diabetes 06/28/2019  ? Sepsis secondary to UTI (HCC) 11/17/2017  ? UTI (urinary tract infection) 11/17/2017  ? Left calcaneal bursitis 12/14/2014  ? Pronation deformity of ankle, acquired 10/26/2014  ? Allergic reaction 10/14/2014  ? Achilles tendinosis 10/05/2014  ? OSA (obstructive sleep apnea) 08/18/2014  ? Gout 08/14/2014  ? Obesity 06/02/2013  ? Abnormal MRI, spine 05/09/2013  ? H/O urinary retention 05/09/2013  ? Psoriasis 05/08/2012  ? Coronary artery disease 08/03/2008  ? Hyperlipidemia 11/10/2007  ? History of gout 11/10/2007  ? Essential hypertension 11/10/2007  ? G E R D 01/06/2007  ?  ?Current Outpatient Medications  ?Medication Sig Dispense Refill  ? allopurinol (ZYLOPRIM) 300 MG tablet TAKE 1 TABLET BY MOUTH  DAILY 90 tablet 3  ? aspirin 81 MG tablet Take 1 tablet (81 mg total) by mouth daily. 90 tablet 1  ? cetirizine (ZYRTEC) 10 MG tablet Take 10 mg by mouth at bedtime.    ? clobetasol cream (TEMOVATE) 0.05 % Apply 1 application topically 2 (two) times daily. Apply 1 application topically 2 (two) times daily as needed. 300 gm is a 90 day supply 300 g 3  ? clonazePAM (KLONOPIN) 0.5 MG tablet Take 1 tablet (0.5 mg total) by mouth 2 (two) times daily as needed for anxiety. 20 tablet 1  ? Coenzyme Q10 (CO Q 10 PO) Take 1 capsule by mouth every morning. 500mg     ? finasteride (PROSCAR) 5 MG tablet Take 1 tablet (5 mg total) by mouth daily. 30 tablet 0  ? fluocinonide (LIDEX) 0.05 % external solution Apply 1 application topically 2 (two) times daily. As needed for psoriasis.  300 ml is a 90 day supply 300 mL 3  ? fluocinonide cream (LIDEX) 0.05 % Apply 1 application topically 2 (two) times daily. As needed for psoriasis 60 g 2  ? FLUoxetine (PROZAC) 20 MG capsule Take 1 capsule (20 mg total) by mouth daily. 90 capsule 3  ? fluticasone (FLONASE) 50 MCG/ACT  nasal spray Place 2 sprays into both nostrils daily. 16 g 1  ? metoprolol succinate (TOPROL-XL) 100 MG 24 hr tablet Take 1 tablet (100 mg total) by mouth daily. 90 tablet 3  ? Multiple Vitamin (MULTIVITAMIN) capsule Take 1 capsule by mouth every morning.    ? nitroGLYCERIN (NITROSTAT) 0.3 MG SL tablet Place 1 tablet (0.3 mg total) under the tongue every 5 (five) minutes as needed for chest pain. 25 tablet 2  ? Omega-3 Fatty Acids (FISH OIL) 1000 MG CAPS Take 1,000 mg by mouth every morning.    ? pregabalin (LYRICA) 150 MG capsule Take 1 capsule (150 mg total) by mouth 2 (two) times daily. 60 capsule 3  ? rosuvastatin (CRESTOR) 20 MG tablet Take 1 tablet (20 mg total) by mouth daily. 90 tablet 3  ? tamsulosin (FLOMAX) 0.4 MG CAPS capsule Take 1 capsule (0.4 mg total) by mouth daily after supper. 30 capsule 0  ? ?No current facility-administered medications for this visit.  ?  ?Allergies: Lidocaine, Neomycin sulfate, Sulfa antibiotics, Sulfa drugs cross reactors, Hctz [hydrochlorothiazide], Tramadol, Atrovent [ipratropium], and Latex  ?Past Medical History:  ?Diagnosis Date  ? BPH (benign prostatic hypertrophy) with urinary retention   ? CAD (coronary artery disease) 04/2008  ? successful PCI of the lesion in the proximal LAD using xience drug-eluting stent with improvement in central narrowing from  95% to 0%.  Dr. Juanda ChanceBrodie  ? Foley catheter in place   ? Gout   ? per pt stable is of 08-18-2014  ? History of acute bronchitis   ? recently 07-16-2014  ? History of sepsis   ? urosepsis--  08-13-2014 admission  ? Hyperlipidemia LDL goal <100   ? Hypertension   ? Multilevel degenerative disc disease   ? cervical and lumbar  ? OSA (obstructive sleep apnea)   ?    ? Psoriasis   ? S/P drug eluting coronary stent placement   ? 05-13-2008   DES X1 TO pLAD  ? Sigmoid diverticulosis   ?  ?Past Surgical History:  ?Procedure Laterality Date  ? BUNIONECTOMY Right 2000  ? COLONOSCOPY WITH PROPOFOL  last one 10-06-2013  ? CORONARY  ANGIOPLASTY WITH STENT PLACEMENT  05-13-2008    [name]  ? PCI w/  DES x1 to  pLAD/  ef 65%  ? GREEN LIGHT LASER TURP (TRANSURETHRAL RESECTION OF PROSTATE N/A 08/23/2014  ? Procedure: GREEN LIGHT LASER TURP (TRANSURETHRAL RESECTION OF PROSTATE;  Surgeon: Bjorn PippinJohn Wrenn, MD;  [facility];  Service: Urology;  Laterality: N/A;  ? WRIST GANGLION EXCISION Left 2001  ?  ?Family History  ?Problem Relation Age of Onset  ? Hypertension Father   ? Stroke Father [id number]  ? Lung cancer Mother   ?      smoker  ? Aneurysm Mother   ? Diabetes Sister   ? Heart disease Brother [id number]  ?      stent LAD 2012  ? Colon cancer Neg Hx   ? Pancreatic cancer Neg Hx   ? Rectal cancer Neg Hx   ? Stomach cancer Neg Hx   ?  ?Social History  ? ?Tobacco Use  ? Smoking status: Former  ?  Packs/day: 0.75  ?  Years: 14.00  ?  Pack years: 10.50  ?  Types: Cigarettes  ?  Quit date: 05/27/1997  ?  Years since quitting: 24.3  ? Smokeless tobacco: Former  ?  Types: Snuff  ?  Quit date: 08/17/1997  ?Substance Use Topics  ? Alcohol use: Yes  ?  Comment: occasional  ?  ?Subjective:  ?Left knee pain x 1.5 weeks; increased "popping" sensation- does not feel that knee is going to give out; pain is localized on inner left knee; no known injury but does feel that knee has been problematic "for years." Limited benefit with Naproxen;  ? ? ? ?Objective:  ?Vitals:  ? 09/18/21 1543  ?BP: 138/80  ?Pulse: 69  ?Temp: 98.4 ?F (36.9 ?C)  ?TempSrc: Oral  ?SpO2: 94%  ?Weight: 271 lb 9.6 oz (123.2 kg)  ?Height: 5\' 8"  (1.727 m)  ?  ?General: Well developed, well nourished, in no acute distress  ?Skin : Warm and dry.  ?Head: Normocephalic and atraumatic  ?Lungs: Respirations unlabored;  ?Musculoskeletal: No deformities; no active joint inflammation  ?Extremities: No edema, cyanosis, clubbing  ?Vessels: Symmetric bilaterally  ?Neurologic: Alert and oriented; speech intact; face symmetrical; moves all extremities well; CNII-XII intact without focal deficit   ? ?Assessment:  ?1. Left knee pain, unspecified chronicity   ?  ?Plan:  ?Concern for possible meniscus tear; refer to sports medicine for further evaluation; follow up to be determined; ? ?This visit occurred during the SARS-CoV-2 public health emergency.  Safety protocols were in place, including screening questions prior to the visit, additional usage of staff PPE, and extensive cleaning of exam room while observing  appropriate contact time as indicated for disinfecting solutions.  ? ? ?No follow-ups on file.  ?No orders of the defined types were placed in this encounter. ?  ?Requested Prescriptions  ? ? No prescriptions requested or ordered in this encounter  ?  ? ?

## 2021-09-18 NOTE — Patient Instructions (Signed)
Sports medicine can see you tomorrow; please go to [address] and they will get you scheduled; please just let the lady at the front desk know that Erline Levine and I were working on this.  ?

## 2021-09-19 ENCOUNTER — Other Ambulatory Visit (HOSPITAL_BASED_OUTPATIENT_CLINIC_OR_DEPARTMENT_OTHER): Payer: Self-pay

## 2021-09-19 ENCOUNTER — Encounter: Payer: Self-pay | Admitting: Family Medicine

## 2021-09-19 ENCOUNTER — Ambulatory Visit: Payer: BC Managed Care – PPO | Admitting: Family Medicine

## 2021-09-19 ENCOUNTER — Ambulatory Visit: Payer: Self-pay

## 2021-09-19 ENCOUNTER — Other Ambulatory Visit (INDEPENDENT_AMBULATORY_CARE_PROVIDER_SITE_OTHER): Payer: BC Managed Care – PPO

## 2021-09-19 VITALS — BP 130/74 | Ht 68.0 in | Wt 271.0 lb

## 2021-09-19 DIAGNOSIS — M23204 Derangement of unspecified medial meniscus due to old tear or injury, left knee: Secondary | ICD-10-CM | POA: Diagnosis not present

## 2021-09-19 DIAGNOSIS — E611 Iron deficiency: Secondary | ICD-10-CM | POA: Diagnosis not present

## 2021-09-19 DIAGNOSIS — M23203 Derangement of unspecified medial meniscus due to old tear or injury, right knee: Secondary | ICD-10-CM

## 2021-09-19 DIAGNOSIS — E538 Deficiency of other specified B group vitamins: Secondary | ICD-10-CM

## 2021-09-19 DIAGNOSIS — M25562 Pain in left knee: Secondary | ICD-10-CM

## 2021-09-19 DIAGNOSIS — M179 Osteoarthritis of knee, unspecified: Secondary | ICD-10-CM | POA: Insufficient documentation

## 2021-09-19 MED ORDER — PREDNISONE 5 MG PO TABS
ORAL_TABLET | ORAL | 0 refills | Status: DC
  Filled 2021-09-19: qty 21, 6d supply, fill #0

## 2021-09-19 NOTE — Assessment & Plan Note (Signed)
Acutely occurring.  Symptoms more consistent with a meniscus. ?-Counseled on home exercise therapy and supportive care. ?-Prednisone. ?-Could consider injection, physical therapy or further imaging. ?

## 2021-09-19 NOTE — Progress Notes (Signed)
?  Charles [name] - 61 y.o. male MRN [id number]  Date of birth: May 22, 1961 ? ?SUBJECTIVE:  Including CC & ROS.  ?No chief complaint on file. ? ? ?Charles [name] is a 61 y.o. male that is presenting with acute left knee pain.  The pain is over the medial compartment.  Denies any injury or inciting event.  The pain is worse with certain movements. ? ? ?Review of Systems ?See HPI  ? ?HISTORY: Past Medical, Surgical, Social, and Family History Reviewed & Updated per EMR.   ?Pertinent Historical Findings include: ? ?Past Medical History:  ?Diagnosis Date  ? BPH (benign prostatic hypertrophy) with urinary retention   ? CAD (coronary artery disease) 04/2008  ? successful PCI of the lesion in the proximal LAD using xience drug-eluting stent with improvement in central narrowing from 95% to 0%.  Charles. Juanda Chance  ? Foley catheter in place   ? Gout   ? per pt stable is of 08-18-2014  ? History of acute bronchitis   ? recently 07-16-2014  ? History of sepsis   ? urosepsis--  08-13-2014 admission  ? Hyperlipidemia LDL goal <100   ? Hypertension   ? Multilevel degenerative disc disease   ? cervical and lumbar  ? OSA (obstructive sleep apnea)   ?    ? Psoriasis   ? S/P drug eluting coronary stent placement   ? 05-13-2008   DES X1 TO pLAD  ? Sigmoid diverticulosis   ? ? ?Past Surgical History:  ?Procedure Laterality Date  ? BUNIONECTOMY Right 2000  ? COLONOSCOPY WITH PROPOFOL  last one 10-06-2013  ? CORONARY ANGIOPLASTY WITH STENT PLACEMENT  05-13-2008    Charles [name]  ? PCI w/  DES x1 to  pLAD/  ef 65%  ? GREEN LIGHT LASER TURP (TRANSURETHRAL RESECTION OF PROSTATE N/A 08/23/2014  ? Procedure: GREEN LIGHT LASER TURP (TRANSURETHRAL RESECTION OF PROSTATE;  Surgeon: Bjorn Pippin, MD;  Location: [facility];  Service: Urology;  Laterality: N/A;  ? WRIST GANGLION EXCISION Left 2001  ? ? ? ?PHYSICAL EXAM:  ?VS: BP 130/74 (BP Location: Left Arm, Patient Position: Sitting)   Ht 5\' 8"  (1.727 m)   Wt 271 lb (122.9 kg)   BMI 41.21  kg/m?  ?Physical Exam ?Gen: NAD, alert, cooperative with exam, well-appearing ?MSK:  ?Neurovascularly intact   ? ?Limited ultrasound: Left knee: ? ?No effusion the suprapatellar pouch. ?Normal-appearing quadricep and patellar tendon. ?Mild medial joint space narrowing with outpouching of the medial meniscus. ?No significant changes over the lateral compartment ? ?Summary: Degenerative changes of the medial meniscus ? ?Ultrasound and interpretation by , MD ? ? ? ?ASSESSMENT & PLAN:  ? ?Degenerative tear of medial meniscus of left knee ?Acutely occurring.  Symptoms more consistent with a meniscus. ?-Counseled on home exercise therapy and supportive care. ?-Prednisone. ?-Could consider injection, physical therapy or further imaging. ? ? ? ? ?

## 2021-09-19 NOTE — Patient Instructions (Signed)
Nice to meet you Please use ice as needed  Please try the exercises   Please send me a message in MyChart with any questions or updates.  Please see me back in 4 weeks.   --Dr. Kaliel Bolds  

## 2021-09-20 ENCOUNTER — Encounter: Payer: Self-pay | Admitting: Family Medicine

## 2021-09-20 DIAGNOSIS — M25569 Pain in unspecified knee: Secondary | ICD-10-CM

## 2021-09-20 LAB — VITAMIN B12: Vitamin B-12: 492 pg/mL (ref 211–911)

## 2021-09-20 LAB — FERRITIN: Ferritin: 21.7 ng/mL — ABNORMAL LOW (ref 22.0–322.0)

## 2021-09-24 ENCOUNTER — Other Ambulatory Visit: Payer: Self-pay | Admitting: Family Medicine

## 2021-09-24 DIAGNOSIS — E611 Iron deficiency: Secondary | ICD-10-CM

## 2021-09-25 ENCOUNTER — Telehealth: Payer: Self-pay | Admitting: Family Medicine

## 2021-09-25 NOTE — Telephone Encounter (Signed)
Pt cld ask what is next step in trmt process, Rx completed but knee pain still significant--Pt ask if he can get a Cortizone injection? ?--Forwarding message to provider for review. ? ?-glh ?

## 2021-09-26 NOTE — Telephone Encounter (Signed)
I called pt- He would like left knee normal steroid injection. OV scheduled 09/27/21 @ 9:30.  ?

## 2021-09-27 ENCOUNTER — Ambulatory Visit: Payer: Self-pay

## 2021-09-27 ENCOUNTER — Ambulatory Visit: Payer: BC Managed Care – PPO | Admitting: Family Medicine

## 2021-09-27 ENCOUNTER — Encounter: Payer: Self-pay | Admitting: Family Medicine

## 2021-09-27 VITALS — BP 140/80 | Ht 68.0 in | Wt 271.0 lb

## 2021-09-27 DIAGNOSIS — M23204 Derangement of unspecified medial meniscus due to old tear or injury, left knee: Secondary | ICD-10-CM | POA: Diagnosis not present

## 2021-09-27 MED ORDER — TRIAMCINOLONE ACETONIDE 40 MG/ML IJ SUSP
40.0000 mg | Freq: Once | INTRAMUSCULAR | Status: AC
  Administered 2021-09-27: 40 mg via INTRA_ARTICULAR

## 2021-09-27 NOTE — Progress Notes (Signed)
?Charles [name] - 61 y.o. male MRN [id number]  Date of birth: 1961-04-07 ? ?SUBJECTIVE:  Including CC & ROS.  ?No chief complaint on file. ? ? ?Charles [name] is a 62 y.o. male that is presenting with acute worsening of his left knee pain.  Pain is severe in nature no improvement with prednisone.  Having pain on the medial aspect. ? ? ? ?Review of Systems ?See HPI  ? ?HISTORY: Past Medical, Surgical, Social, and Family History Reviewed & Updated per EMR.   ?Pertinent Historical Findings include: ? ?Past Medical History:  ?Diagnosis Date  ? BPH (benign prostatic hypertrophy) with urinary retention   ? CAD (coronary artery disease) 04/2008  ? successful PCI of the lesion in the proximal LAD using xience drug-eluting stent with improvement in central narrowing from 95% to 0%.  Dr. Juanda Chance  ? Foley catheter in place   ? Gout   ? per pt stable is of 08-18-2014  ? History of acute bronchitis   ? recently 07-16-2014  ? History of sepsis   ? urosepsis--  08-13-2014 admission  ? Hyperlipidemia LDL goal <100   ? Hypertension   ? Multilevel degenerative disc disease   ? cervical and lumbar  ? OSA (obstructive sleep apnea)   ?    ? Psoriasis   ? S/P drug eluting coronary stent placement   ? 05-13-2008   DES X1 TO pLAD  ? Sigmoid diverticulosis   ? ? ?Past Surgical History:  ?Procedure Laterality Date  ? BUNIONECTOMY Right 2000  ? COLONOSCOPY WITH PROPOFOL  last one 10-06-2013  ? CORONARY ANGIOPLASTY WITH STENT PLACEMENT  05-13-2008    Dr Charlies Constable  ? PCI w/  DES x1 to  pLAD/  ef 65%  ? GREEN LIGHT LASER TURP (TRANSURETHRAL RESECTION OF PROSTATE N/A 08/23/2014  ? Procedure: GREEN LIGHT LASER TURP (TRANSURETHRAL RESECTION OF PROSTATE;  Surgeon: Bjorn Pippin, MD;  Location: [facility];  Service: Urology;  Laterality: N/A;  ? WRIST GANGLION EXCISION Left 2001  ? ? ? ?PHYSICAL EXAM:  ?VS: BP 140/80 (BP Location: Left Arm, Patient Position: Sitting)   Ht 5\' 8"  (1.727 m)   Wt 271 lb (122.9 kg)   BMI 41.21 kg/m?   ?Physical Exam ?Gen: NAD, alert, cooperative with exam, well-appearing ?MSK:  ?Neurovascularly intact   ? ? ?Aspiration/Injection Procedure Note ? ?27-Dec-1960 ? ?Procedure: Injection ?Indications: Left knee pain ? ?Procedure Details ?Consent: Risks of procedure as well as the alternatives and risks of each were explained to the (patient/caregiver).  Consent for procedure obtained. ?Time Out: Verified patient identification, verified procedure, site/side was marked, verified correct patient position, special equipment/implants available, medications/allergies/relevent history reviewed, required imaging and test results available.  Performed.  The area was cleaned with iodine and alcohol swabs.   ? ?The left knee superior lateral suprapatellar pouch was injected using 3 cc of 1% lidocaine on a 22-gauge 1-1/2 inch needle.  The syringe was switched and a mixture containing 1 cc's of 40 mg Kenalog and 4 cc's of 0.25% bupivacaine was injected.  Ultrasound was used. Images were obtained in long views showing the injection.   ? ? ?A sterile dressing was applied. ? ?Patient did tolerate procedure well. ? ? ? ? ?ASSESSMENT & PLAN:  ? ?Degenerative tear of medial meniscus of left knee ?Acute worsening of his pain.  Symptoms still consistent with the meniscus in the medial aspect.  No effusion on exam today. ?-Counseled on home exercise therapy and supportive care. ?-Injection  today. ?-Could consider physical therapy, custom orthotics or further imaging. ? ? ? ? ?

## 2021-09-27 NOTE — Patient Instructions (Signed)
Good to see you ?Please try ice  ?Please try the brace   ?Please send me a message in MyChart with any questions or updates.  ?Please see me back as scheduled.  ? ?--Dr. Raeford Razor ? ?

## 2021-09-27 NOTE — Assessment & Plan Note (Signed)
Acute worsening of his pain.  Symptoms still consistent with the meniscus in the medial aspect.  No effusion on exam today. ?-Counseled on home exercise therapy and supportive care. ?-Injection today. ?-Could consider physical therapy, custom orthotics or further imaging. ?

## 2021-10-29 ENCOUNTER — Ambulatory Visit: Payer: Self-pay

## 2021-10-29 ENCOUNTER — Ambulatory Visit: Payer: BC Managed Care – PPO | Admitting: Family Medicine

## 2021-10-29 ENCOUNTER — Encounter: Payer: Self-pay | Admitting: Family Medicine

## 2021-10-29 ENCOUNTER — Ambulatory Visit (HOSPITAL_BASED_OUTPATIENT_CLINIC_OR_DEPARTMENT_OTHER)
Admission: RE | Admit: 2021-10-29 | Discharge: 2021-10-29 | Disposition: A | Discharge status: Home / Self Care | Payer: BC Managed Care – PPO | Source: Ambulatory Visit | Attending: Family Medicine | Admitting: Family Medicine

## 2021-10-29 VITALS — BP 120/72 | Ht 68.0 in | Wt 271.0 lb

## 2021-10-29 DIAGNOSIS — M25562 Pain in left knee: Secondary | ICD-10-CM | POA: Diagnosis not present

## 2021-10-29 DIAGNOSIS — M1712 Unilateral primary osteoarthritis, left knee: Secondary | ICD-10-CM

## 2021-10-29 MED ORDER — KETOROLAC TROMETHAMINE 30 MG/ML IJ SOLN
30.0000 mg | Freq: Once | INTRAMUSCULAR | Status: AC
  Administered 2021-10-29: 30 mg via INTRA_ARTICULAR

## 2021-10-29 NOTE — Assessment & Plan Note (Addendum)
Acutely occurring. Still having pain after recent steroid injection.  - counseled on home exercise therapy and supportive care - toradol injection intra articular  - xrays - pursue gel injection  - could consider zilretta injection.

## 2021-10-29 NOTE — Patient Instructions (Signed)
Good to see you °Please use ice as needed   °Please send me a message in MyChart with any questions or updates.  °We'll call you when the gel injection is in.  ° °--Dr. Lisle Skillman ° °

## 2021-10-29 NOTE — Progress Notes (Signed)
Charles [name] - 61 y.o. male MRN [id number]  Date of birth: [birth year]  SUBJECTIVE:  Including CC & ROS.  No chief complaint on file.   Charles [name] is a 61 y.o. male that is  presenting with worsening of his left knee pain. Has tried steroid injection with limited improvement. Still having pain on the medial aspect. Has tried bracing.   Review of Systems See HPI   HISTORY: Past Medical, Surgical, Social, and Family History Reviewed & Updated per EMR.   Pertinent Historical Findings include:  Past Medical History:  Diagnosis Date   BPH (benign prostatic hypertrophy) with urinary retention    CAD (coronary artery disease) 04/2008   successful PCI of the lesion in the proximal LAD using xience drug-eluting stent with improvement in central narrowing from 95% to 0%.  Dr. Juanda Chance   Foley catheter in place    Gout    per pt stable is of 08-18-2014   History of acute bronchitis    recently 07-16-2014   History of sepsis    urosepsis--  08-13-2014 admission   Hyperlipidemia LDL goal <100    Hypertension    Multilevel degenerative disc disease    cervical and lumbar   OSA (obstructive sleep apnea)        Psoriasis    S/P drug eluting coronary stent placement    05-13-2008   DES X1 TO pLAD   Sigmoid diverticulosis     Past Surgical History:  Procedure Laterality Date   BUNIONECTOMY Right 2000   COLONOSCOPY WITH PROPOFOL  last one 10-06-2013   CORONARY ANGIOPLASTY WITH STENT PLACEMENT  05-13-2008    Dr Charlies Constable   PCI w/  DES x1 to  pLAD/  ef 65%   GREEN LIGHT LASER TURP (TRANSURETHRAL RESECTION OF PROSTATE N/A 08/23/2014   Procedure: GREEN LIGHT LASER TURP (TRANSURETHRAL RESECTION OF PROSTATE;  Surgeon: Bjorn Pippin, MD;  Location: [facility];  Service: Urology;  Laterality: N/A;   WRIST GANGLION EXCISION Left 2001     PHYSICAL EXAM:  VS: BP 120/72 (BP Location: Left Arm, Patient Position: Sitting)   Ht 5\' 8"  (1.727 m)   Wt 271 lb (122.9 kg)   BMI  41.21 kg/m  Physical Exam Gen: NAD, alert, cooperative with exam, well-appearing MSK:  Neurovascularly intact     Aspiration/Injection Procedure Note Charles [name] [birth year]  Procedure: Injection Indications: left knee pain  Procedure Details Consent: Risks of procedure as well as the alternatives and risks of each were explained to the (patient/caregiver).  Consent for procedure obtained. Time Out: Verified patient identification, verified procedure, site/side was marked, verified correct patient position, special equipment/implants available, medications/allergies/relevent history reviewed, required imaging and test results available.  Performed.  The area was cleaned with iodine and alcohol swabs.    The left knee suprapatellar pouch was injected using 3 cc of 1% lidocaine on a 22-gauge 1 1/2" needle.  The syringe was switched to mixture containing 1 cc's of 30 mg Toradol and 4 cc's of 0.5% bupivacaine was injected.  Ultrasound was used. Images were obtained in long views showing the injection.     A sterile dressing was applied.  Patient did tolerate procedure well.     ASSESSMENT & PLAN:   OA (osteoarthritis) of knee Acutely occurring. Still having pain after recent steroid injection.  - counseled on home exercise therapy and supportive care - toradol injection intra articular  - xrays - pursue gel injection  - could consider zilretta injection.

## 2021-10-30 ENCOUNTER — Telehealth: Payer: Self-pay | Admitting: Family Medicine

## 2021-10-30 NOTE — Telephone Encounter (Signed)
Pt informed of below.  

## 2021-10-30 NOTE — Telephone Encounter (Signed)
Left VM for patient. If he calls back please have him speak with a nurse/CMA and inform that his xrays show arthritis on the inside portion of his knee.   If any questions then please take the best time and phone number to call and I will try to call him back.   Myra Rude, MD [facility] 10/30/2021, 10:38 AM

## 2021-11-12 ENCOUNTER — Other Ambulatory Visit: Payer: Self-pay | Admitting: Family Medicine

## 2021-11-12 DIAGNOSIS — I10 Essential (primary) hypertension: Secondary | ICD-10-CM

## 2021-11-28 ENCOUNTER — Encounter: Payer: Self-pay | Admitting: Family Medicine

## 2021-11-28 DIAGNOSIS — M79671 Pain in right foot: Secondary | ICD-10-CM

## 2021-11-29 NOTE — Addendum Note (Signed)
Addended by: Abbe Amsterdam C on: 11/29/2021 06:17 AM   Modules accepted: Orders

## 2021-11-30 ENCOUNTER — Encounter: Payer: Self-pay | Admitting: Neurology

## 2021-12-03 ENCOUNTER — Other Ambulatory Visit (INDEPENDENT_AMBULATORY_CARE_PROVIDER_SITE_OTHER): Payer: BC Managed Care – PPO

## 2021-12-03 ENCOUNTER — Encounter: Payer: Self-pay | Admitting: Family Medicine

## 2021-12-03 DIAGNOSIS — E611 Iron deficiency: Secondary | ICD-10-CM | POA: Diagnosis not present

## 2021-12-03 LAB — FERRITIN: Ferritin: 31.3 ng/mL (ref 22.0–322.0)

## 2021-12-04 ENCOUNTER — Encounter: Payer: Self-pay | Admitting: Family Medicine

## 2021-12-04 ENCOUNTER — Ambulatory Visit (HOSPITAL_COMMUNITY)
Admission: RE | Admit: 2021-12-04 | Discharge: 2021-12-04 | Disposition: A | Discharge status: Home / Self Care | Payer: BC Managed Care – PPO | Source: Ambulatory Visit | Attending: Internal Medicine | Admitting: Internal Medicine

## 2021-12-04 DIAGNOSIS — M79671 Pain in right foot: Secondary | ICD-10-CM

## 2021-12-04 DIAGNOSIS — M79672 Pain in left foot: Secondary | ICD-10-CM | POA: Diagnosis not present

## 2021-12-10 ENCOUNTER — Ambulatory Visit: Payer: BC Managed Care – PPO | Admitting: Family Medicine

## 2021-12-10 ENCOUNTER — Encounter: Payer: Self-pay | Admitting: Family Medicine

## 2021-12-10 ENCOUNTER — Ambulatory Visit: Payer: Self-pay

## 2021-12-10 VITALS — BP 140/86 | Ht 68.0 in | Wt 271.0 lb

## 2021-12-10 DIAGNOSIS — M1712 Unilateral primary osteoarthritis, left knee: Secondary | ICD-10-CM

## 2021-12-10 MED ORDER — HYLAN G-F 20 16 MG/2ML IX SOSY
16.0000 mg | PREFILLED_SYRINGE | Freq: Once | INTRA_ARTICULAR | Status: AC
  Administered 2021-12-10: 16 mg via INTRA_ARTICULAR

## 2021-12-10 NOTE — Progress Notes (Signed)
  Charles [name] - 61 y.o. male MRN [id number]  Date of birth: 1960-11-24  SUBJECTIVE:  Including CC & ROS.  No chief complaint on file.   Charles [name] is a 61 y.o. male that is  here for gel injection.    Review of Systems See HPI   HISTORY: Past Medical, Surgical, Social, and Family History Reviewed & Updated per EMR.   Pertinent Historical Findings include:  Past Medical History:  Diagnosis Date   BPH (benign prostatic hypertrophy) with urinary retention    CAD (coronary artery disease) 04/2008   successful PCI of the lesion in the proximal LAD using xience drug-eluting stent with improvement in central narrowing from 95% to 0%.  Dr. Juanda Chance   Foley catheter in place    Gout    per pt stable is of 08-18-2014   History of acute bronchitis    recently 07-16-2014   History of sepsis    urosepsis--  08-13-2014 admission   Hyperlipidemia LDL goal <100    Hypertension    Multilevel degenerative disc disease    cervical and lumbar   OSA (obstructive sleep apnea)        Psoriasis    S/P drug eluting coronary stent placement    05-13-2008   DES X1 TO pLAD   Sigmoid diverticulosis     Past Surgical History:  Procedure Laterality Date   BUNIONECTOMY Right 2000   COLONOSCOPY WITH PROPOFOL  last one 10-06-2013   CORONARY ANGIOPLASTY WITH STENT PLACEMENT  05-13-2008    Dr Charlies Constable   PCI w/  DES x1 to  pLAD/  ef 65%   GREEN LIGHT LASER TURP (TRANSURETHRAL RESECTION OF PROSTATE N/A 08/23/2014   Procedure: GREEN LIGHT LASER TURP (TRANSURETHRAL RESECTION OF PROSTATE;  Surgeon: Bjorn Pippin, MD;  Location: [facility];  Service: Urology;  Laterality: N/A;   WRIST GANGLION EXCISION Left 2001     PHYSICAL EXAM:  VS: BP 140/86 (BP Location: Left Arm, Patient Position: Sitting)   Ht 5\' 8"  (1.727 m)   Wt 271 lb (122.9 kg)   BMI 41.21 kg/m  Physical Exam Gen: NAD, alert, cooperative with exam, well-appearing MSK:  Neurovascularly intact      Aspiration/Injection Procedure Note Charles [name] 12-Dec-1960  Procedure: Injection Indications: left knee pain  Procedure Details Consent: Risks of procedure as well as the alternatives and risks of each were explained to the (patient/caregiver).  Consent for procedure obtained. Time Out: Verified patient identification, verified procedure, site/side was marked, verified correct patient position, special equipment/implants available, medications/allergies/relevent history reviewed, required imaging and test results available.  Performed.  The area was cleaned with iodine and alcohol swabs.    The left knee superior lateral suprapatellar pouch was injected using 4 cc's of 1% lidocaine with a 21 2" needle.  The syringe was switched and 2 mL of synvisc was injected. Ultrasound was used. Images were obtained in  Long views showing the injection.    A sterile dressing was applied.  Patient did tolerate procedure well.    ASSESSMENT & PLAN:   OA (osteoarthritis) of knee Completed synvisc 1/3 today

## 2021-12-10 NOTE — Assessment & Plan Note (Signed)
Completed synvisc 1/3 today

## 2021-12-10 NOTE — Patient Instructions (Signed)
Good to see you Please use ice  Please send me a message in MyChart with any questions or updates.  Please see me back in 1 week.   --Dr. Chalise Pe  

## 2021-12-17 ENCOUNTER — Encounter: Payer: Self-pay | Admitting: Family Medicine

## 2021-12-17 DIAGNOSIS — G629 Polyneuropathy, unspecified: Secondary | ICD-10-CM

## 2021-12-17 MED ORDER — PREGABALIN 150 MG PO CAPS: 150.0000 mg | ORAL_CAPSULE | Freq: Two times a day (BID) | ORAL | 3 refills | Status: DC

## 2021-12-24 ENCOUNTER — Ambulatory Visit: Payer: Self-pay

## 2021-12-24 ENCOUNTER — Encounter: Payer: Self-pay | Admitting: Family Medicine

## 2021-12-24 ENCOUNTER — Ambulatory Visit: Payer: BC Managed Care – PPO | Admitting: Family Medicine

## 2021-12-24 VITALS — BP 135/80 | Ht 68.0 in | Wt 265.0 lb

## 2021-12-24 DIAGNOSIS — M1712 Unilateral primary osteoarthritis, left knee: Secondary | ICD-10-CM

## 2021-12-24 MED ORDER — HYLAN G-F 20 16 MG/2ML IX SOSY
16.0000 mg | PREFILLED_SYRINGE | Freq: Once | INTRA_ARTICULAR | Status: AC
  Administered 2021-12-24: 16 mg via INTRA_ARTICULAR

## 2021-12-24 NOTE — Patient Instructions (Signed)
Good to see you Please use ice as needed  Please send me a message in MyChart with any questions or updates.  Please see me back in 1 week.   --Dr. Quitman Norberto  

## 2021-12-24 NOTE — Assessment & Plan Note (Addendum)
Completed synvisc 2/3 injection.

## 2021-12-24 NOTE — Progress Notes (Signed)
  Charles [name] - 61 y.o. male MRN [id number]  Date of birth: 1960-10-16  SUBJECTIVE:  Including CC & ROS.  No chief complaint on file.   Charles [name] is a 61 y.o. male that is  here for gel injection.   Review of Systems See HPI   HISTORY: Past Medical, Surgical, Social, and Family History Reviewed & Updated per EMR.   Pertinent Historical Findings include:  Past Medical History:  Diagnosis Date   BPH (benign prostatic hypertrophy) with urinary retention    CAD (coronary artery disease) 04/2008   successful PCI of the lesion in the proximal LAD using xience drug-eluting stent with improvement in central narrowing from 95% to 0%.  Dr. Juanda Chance   Foley catheter in place    Gout    per pt stable is of 08-18-2014   History of acute bronchitis    recently 07-16-2014   History of sepsis    urosepsis--  08-13-2014 admission   Hyperlipidemia LDL goal <100    Hypertension    Multilevel degenerative disc disease    cervical and lumbar   OSA (obstructive sleep apnea)        Psoriasis    S/P drug eluting coronary stent placement    05-13-2008   DES X1 TO pLAD   Sigmoid diverticulosis     Past Surgical History:  Procedure Laterality Date   BUNIONECTOMY Right 2000   COLONOSCOPY WITH PROPOFOL  last one 10-06-2013   CORONARY ANGIOPLASTY WITH STENT PLACEMENT  05-13-2008    Dr Charlies Constable   PCI w/  DES x1 to  pLAD/  ef 65%   GREEN LIGHT LASER TURP (TRANSURETHRAL RESECTION OF PROSTATE N/A 08/23/2014   Procedure: GREEN LIGHT LASER TURP (TRANSURETHRAL RESECTION OF PROSTATE;  Surgeon: Bjorn Pippin, MD;  Location: [facility];  Service: Urology;  Laterality: N/A;   WRIST GANGLION EXCISION Left 2001     PHYSICAL EXAM:  VS: BP 135/80 (BP Location: Right Arm, Patient Position: Sitting, Cuff Size: Normal)   Ht 5\' 8"  (1.727 m)   Wt 265 lb (120.2 kg)   BMI 40.29 kg/m  Physical Exam Gen: NAD, alert, cooperative with exam, well-appearing MSK:  Neurovascularly intact     Aspiration/Injection Procedure Note Charles [name] Jun 10, 1960   Procedure: Injection Indications: left knee pain   Procedure Details Consent: Risks of procedure as well as the alternatives and risks of each were explained to the (patient/caregiver).  Consent for procedure obtained. Time Out: Verified patient identification, verified procedure, site/side was marked, verified correct patient position, special equipment/implants available, medications/allergies/relevent history reviewed, required imaging and test results available.  Performed.  The area was cleaned with iodine and alcohol swabs.     The left knee superior lateral suprapatellar pouch was injected using 4 cc's of 1% lidocaine with a 21 2" needle.  The syringe was switched and 2 mL of synvisc was injected. Ultrasound was used. Images were obtained in  Long views showing the injection.     A sterile dressing was applied.   Patient did tolerate procedure well.   ASSESSMENT & PLAN:   OA (osteoarthritis) of knee Completed synvisc 2/3 injection.

## 2021-12-31 ENCOUNTER — Encounter: Payer: Self-pay | Admitting: Family Medicine

## 2021-12-31 ENCOUNTER — Ambulatory Visit: Payer: Self-pay

## 2021-12-31 ENCOUNTER — Ambulatory Visit: Payer: BC Managed Care – PPO | Admitting: Family Medicine

## 2021-12-31 VITALS — BP 154/95 | Ht 68.0 in | Wt 265.0 lb

## 2021-12-31 DIAGNOSIS — M1712 Unilateral primary osteoarthritis, left knee: Secondary | ICD-10-CM

## 2021-12-31 MED ORDER — HYLAN G-F 20 16 MG/2ML IX SOSY
16.0000 mg | PREFILLED_SYRINGE | Freq: Once | INTRA_ARTICULAR | Status: AC
  Administered 2021-12-31: 16 mg via INTRA_ARTICULAR

## 2021-12-31 NOTE — Patient Instructions (Signed)
Good to see you  Please use ice as needed  Please send me a message in MyChart with any questions or updates.  Please see me back in 4 weeks.   --Dr. Alfreida Steffenhagen  

## 2021-12-31 NOTE — Addendum Note (Signed)
Addended by: Merrilyn Puma on: 12/31/2021 09:53 AM   Modules accepted: Orders

## 2021-12-31 NOTE — Assessment & Plan Note (Addendum)
Completed synvisc injection. 3/3  -Could consider Zilretta or further imaging.

## 2021-12-31 NOTE — Progress Notes (Signed)
  Elmer Merwin - 61 y.o. male MRN [id number]  Date of birth: October 31, 1960  SUBJECTIVE:  Including CC & ROS.  No chief complaint on file.   Braxden Lovering is a 61 y.o. male that is here for gel injection.    Review of Systems See HPI   HISTORY: Past Medical, Surgical, Social, and Family History Reviewed & Updated per EMR.   Pertinent Historical Findings include:  Past Medical History:  Diagnosis Date   BPH (benign prostatic hypertrophy) with urinary retention    CAD (coronary artery disease) 04/2008   successful PCI of the lesion in the proximal LAD using xience drug-eluting stent with improvement in central narrowing from 95% to 0%.  Dr. Juanda Chance   Foley catheter in place    Gout    per pt stable is of 08-18-2014   History of acute bronchitis    recently 07-16-2014   History of sepsis    urosepsis--  08-13-2014 admission   Hyperlipidemia LDL goal <100    Hypertension    Multilevel degenerative disc disease    cervical and lumbar   OSA (obstructive sleep apnea)        Psoriasis    S/P drug eluting coronary stent placement    05-13-2008   DES X1 TO pLAD   Sigmoid diverticulosis     Past Surgical History:  Procedure Laterality Date   BUNIONECTOMY Right 2000   COLONOSCOPY WITH PROPOFOL  last one 10-06-2013   CORONARY ANGIOPLASTY WITH STENT PLACEMENT  05-13-2008    Dr Charlies Constable   PCI w/  DES x1 to  pLAD/  ef 65%   GREEN LIGHT LASER TURP (TRANSURETHRAL RESECTION OF PROSTATE N/A 08/23/2014   Procedure: GREEN LIGHT LASER TURP (TRANSURETHRAL RESECTION OF PROSTATE;  Surgeon: Bjorn Pippin, MD;  Location: [facility];  Service: Urology;  Laterality: N/A;   WRIST GANGLION EXCISION Left 2001     PHYSICAL EXAM:  VS: BP (!) 154/95 (BP Location: Right Arm, Patient Position: Sitting)   Ht 5\' 8"  (1.727 m)   Wt 265 lb (120.2 kg)   BMI 40.29 kg/m  Physical Exam Gen: NAD, alert, cooperative with exam, well-appearing MSK:  Neurovascularly intact     Aspiration/Injection Procedure Note Yechiel Erny 1960-08-08   Procedure: Injection Indications: left knee pain   Procedure Details Consent: Risks of procedure as well as the alternatives and risks of each were explained to the (patient/caregiver).  Consent for procedure obtained. Time Out: Verified patient identification, verified procedure, site/side was marked, verified correct patient position, special equipment/implants available, medications/allergies/relevent history reviewed, required imaging and test results available.  Performed.  The area was cleaned with iodine and alcohol swabs.     The left knee superior lateral suprapatellar pouch was injected using 4 cc's of 1% lidocaine with a 21 2" needle.  The syringe was switched and 2 mL of synvisc was injected. Ultrasound was used. Images were obtained in  Long views showing the injection.     A sterile dressing was applied.   Patient did tolerate procedure well.   ASSESSMENT & PLAN:   OA (osteoarthritis) of knee Completed synvisc injection. 3/3  -Could consider Zilretta or further imaging.

## 2022-01-10 ENCOUNTER — Other Ambulatory Visit: Payer: Self-pay | Admitting: Family Medicine

## 2022-01-10 DIAGNOSIS — F419 Anxiety disorder, unspecified: Secondary | ICD-10-CM

## 2022-01-30 ENCOUNTER — Encounter: Payer: Self-pay | Admitting: Family Medicine

## 2022-01-30 ENCOUNTER — Telehealth: Payer: Self-pay | Admitting: Family Medicine

## 2022-01-30 ENCOUNTER — Ambulatory Visit: Payer: BC Managed Care – PPO | Admitting: Family Medicine

## 2022-01-30 ENCOUNTER — Other Ambulatory Visit: Payer: Self-pay

## 2022-01-30 VITALS — BP 132/88 | Ht 68.0 in | Wt 265.0 lb

## 2022-01-30 DIAGNOSIS — M216X1 Other acquired deformities of right foot: Secondary | ICD-10-CM

## 2022-01-30 DIAGNOSIS — Z111 Encounter for screening for respiratory tuberculosis: Secondary | ICD-10-CM

## 2022-01-30 DIAGNOSIS — M1712 Unilateral primary osteoarthritis, left knee: Secondary | ICD-10-CM

## 2022-01-30 NOTE — Assessment & Plan Note (Addendum)
Acute on chronic in nature. Has tried orthotics in the past. Tends to have more pain on the dorsum of the foot and worse with walking.  - counseled on home exercise therapy and supportive care - try orthotics - referral to physical therapy  - green sport insoles  - could consider imaging.

## 2022-01-30 NOTE — Telephone Encounter (Signed)
Patient requesting TB- gold test  Can we do this for him?   Also patient wanting to schedule for Flu shot... I let him know we do not have them in & when we do we will get him scheduled! ( He would like the egg based one)

## 2022-01-30 NOTE — Progress Notes (Addendum)
  Charles [name] - 61 y.o. male MRN [id number]  Date of birth: September 02, 1960  SUBJECTIVE:  Including CC & ROS.  No chief complaint on file.   Charles [name] is a 61 y.o. male that is following up for his left knee pain and presenting with acute on chronic low back pain and right foot pain.  His knee is doing better since the gel injections.  He continues to have medial sided pain intermittently.    Review of Systems See HPI   HISTORY: Past Medical, Surgical, Social, and Family History Reviewed & Updated per EMR.   Pertinent Historical Findings include:  Past Medical History:  Diagnosis Date   BPH (benign prostatic hypertrophy) with urinary retention    CAD (coronary artery disease) 04/2008   successful PCI of the lesion in the proximal LAD using xience drug-eluting stent with improvement in central narrowing from 95% to 0%.  Dr. Juanda Chance   Foley catheter in place    Gout    per pt stable is of 08-18-2014   History of acute bronchitis    recently 07-16-2014   History of sepsis    urosepsis--  08-13-2014 admission   Hyperlipidemia LDL goal <100    Hypertension    Multilevel degenerative disc disease    cervical and lumbar   OSA (obstructive sleep apnea)        Psoriasis    S/P drug eluting coronary stent placement    05-13-2008   DES X1 TO pLAD   Sigmoid diverticulosis     Past Surgical History:  Procedure Laterality Date   BUNIONECTOMY Right 2000   COLONOSCOPY WITH PROPOFOL  last one 10-06-2013   CORONARY ANGIOPLASTY WITH STENT PLACEMENT  05-13-2008    Dr Charlies Constable   PCI w/  DES x1 to  pLAD/  ef 65%   GREEN LIGHT LASER TURP (TRANSURETHRAL RESECTION OF PROSTATE N/A 08/23/2014   Procedure: GREEN LIGHT LASER TURP (TRANSURETHRAL RESECTION OF PROSTATE;  Surgeon: Bjorn Pippin, MD;  Location: [facility];  Service: Urology;  Laterality: N/A;   WRIST GANGLION EXCISION Left 2001     PHYSICAL EXAM:  VS: BP 132/88 (BP Location: Left Arm, Patient Position: Sitting)    Ht 5\' 8"  (1.727 m)   Wt 265 lb (120.2 kg)   BMI 40.29 kg/m  Physical Exam Gen: NAD, alert, cooperative with exam, well-appearing MSK:  Neurovascularly intact       ASSESSMENT & PLAN:   OA (osteoarthritis) of knee Has gotten improvement with the gel injection.  Still feels weak in his knee.  Continues to use the hinged knee brace. -Counseled on home exercise therapy and supportive care. -referral to Physical therapy. -Could consider further imaging or Zilretta.  Pronation deformity of ankle, acquired Acute on chronic in nature. Has tried orthotics in the past. Tends to have more pain on the dorsum of the foot and worse with walking.  - counseled on home exercise therapy and supportive care - try orthotics - referral to physical therapy  - green sport insoles  - could consider imaging.

## 2022-01-30 NOTE — Assessment & Plan Note (Signed)
Has gotten improvement with the gel injection.  Still feels weak in his knee.  Continues to use the hinged knee brace. -Counseled on home exercise therapy and supportive care. -referral to Physical therapy. -Could consider further imaging or Zilretta.

## 2022-01-30 NOTE — Patient Instructions (Signed)
Good to see you Please continue the exercises  I have made a referral to physical therapy  Please send me a message in MyChart with any questions or updates.  Please see me back to have orthotics.   --Dr. Jordan Likes

## 2022-01-30 NOTE — Telephone Encounter (Signed)
Order placed- will call to schedule.

## 2022-01-30 NOTE — Telephone Encounter (Signed)
Last OV was in April- was an acute issue. Okay for order?

## 2022-01-30 NOTE — Telephone Encounter (Signed)
Left vm to return call.    

## 2022-01-31 ENCOUNTER — Encounter: Payer: Self-pay | Admitting: Family Medicine

## 2022-02-04 DIAGNOSIS — M25562 Pain in left knee: Secondary | ICD-10-CM | POA: Diagnosis not present

## 2022-02-07 ENCOUNTER — Encounter: Payer: BC Managed Care – PPO | Admitting: Family Medicine

## 2022-02-11 ENCOUNTER — Encounter: Payer: Self-pay | Admitting: Family Medicine

## 2022-02-11 DIAGNOSIS — M25562 Pain in left knee: Secondary | ICD-10-CM | POA: Diagnosis not present

## 2022-02-12 NOTE — Progress Notes (Unsigned)
Initial neurology clinic note  SERVICE DATE: 02/14/22 SERVICE TIME: 8:00 am  Reason for Evaluation: Consultation requested by Copland, Gwenlyn Found, MD for an opinion regarding bilateral foot pain. My final recommendations will be communicated back to the requesting physician by way of shared medical record or letter to requesting physician via [place] mail.  HPI: This is [name], a 61 y.o. right-handed male with a medical history of HTN, pre-diabetes, CAD s/p PCI, plaque psoriasis, OSA (on CPAP), gout, left meniscus tear of knee, OA, GERD, former smoker, BPH, and depression who presents to neurology clinic with the chief complaint of bilateral foot pain. The patient is alone today.  Patient's symptoms started about 1 year ago. His job requires a lot of walking and physical activity on his feet. He rarely gets to get off his feet. He noticed his feet would burn late in day. He noticed it on the top of his foot into his toes. The burning got worse as night went on. He could still get on his feet and walk, but this did not help the sensation. Sitting could make it better, but would get worse at night. If he tried to ignore it, eventually he could go to sleep. When it first occurred though, he could not sleep. Walking makes the symptoms worse. If he rests, it can improve some after about 20-30 minutes. Of note, patient is currently on disability due to left knee problems. He is on his feet less now as a result. Things seemed a little better when off his feet. Now when he walks, he feels it may be even more intense. Over the past year, his symptoms are about the same and not spread. He denies numbness and tingling.   Now his symptoms can range from 2/10 to 9/10 discomfort. He thinks the symptoms average about 4/10.  He denies cramps or twitching. He has no symptoms in his upper extremities. He denies motor weakness. He denies imbalance or falls.  The patient does not report symptoms referable to  autonomic dysfunction including impaired sweating, heat or cold intolerance, excessive mucosal dryness, gastroparetic early satiety, postprandial abdominal bloating, constipation, bowel or bladder dyscontrol. He does have some erectile dysfunction for many years (sees urology for BPH). He occasionally has lightheadedness when standing, which has been present for many years and attributed to BP med use (metoprolol).  The patient does not report any constitutional symptoms like fever, night sweats, anorexia or unintentional weight loss.  EtOH use: None currently, used to drink 1-2 beers with dinner on weekends  Restrictive diet? No Family history of neuropathy/myopathy/NM disease? No  Patient was referred by his PCP, Dr. Patsy [name]. Per 06/18/21 office note, patient has had foot pain since 01/2021 that is worse when he is on his feet. Symptoms are also worse at night. Patient was put on gabapentin which was changed to Lyrica after 10 days as gabapentin did not seem to change symptoms. Lyrica is helping a little bit. B12 was borderline low at that time, so B12 supplementation was added (1000 mcg daily), he is taking it. Lyrica was increased to 150 mg BID on 08/27/21. This changed helped a little. He is not completely sure because he still has symptoms. He will occasionally have a big flair and wonders if the Lyrica should help with this. Arterial studies of the legs on 12/04/21 were normal.  He is also currently on fluoxetine 20 mg daily for mood and klonopin 0.5 mg PRN for anxiety.   MEDICATIONS:  Outpatient Encounter Medications as of 02/14/2022  Medication Sig   allopurinol (ZYLOPRIM) 300 MG tablet TAKE 1 TABLET BY MOUTH  DAILY   aspirin 81 MG tablet Take 1 tablet (81 mg total) by mouth daily.   cetirizine (ZYRTEC) 10 MG tablet Take 10 mg by mouth at bedtime.   clonazePAM (KLONOPIN) 0.5 MG tablet Take 1 tablet (0.5 mg total) by mouth 2 (two) times daily as needed for anxiety.   Coenzyme Q10 (CO Q 10 PO)  Take 1 capsule by mouth every morning. 500mg    finasteride (PROSCAR) 5 MG tablet Take 1 tablet (5 mg total) by mouth daily.   fluocinonide (LIDEX) 0.05 % external solution Apply 1 application topically 2 (two) times daily. As needed for psoriasis.  300 ml is a 90 day supply   fluocinonide cream (LIDEX) 2.44 % Apply 1 application topically 2 (two) times daily. As needed for psoriasis   FLUoxetine (PROZAC) 20 MG capsule TAKE 1 CAPSULE BY MOUTH  DAILY   fluticasone (FLONASE) 50 MCG/ACT nasal spray Place 2 sprays into both nostrils daily.   metoprolol succinate (TOPROL-XL) 100 MG 24 hr tablet TAKE 1 TABLET BY MOUTH  DAILY   Multiple Vitamin (MULTIVITAMIN) capsule Take 1 capsule by mouth every morning.   nitroGLYCERIN (NITROSTAT) 0.3 MG SL tablet Place 1 tablet (0.3 mg total) under the tongue every 5 (five) minutes as needed for chest pain.   Omega-3 Fatty Acids (FISH OIL) 1000 MG CAPS Take 1,000 mg by mouth every morning.   pregabalin (LYRICA) 150 MG capsule Take 1 capsule (150 mg total) by mouth 2 (two) times daily.   rosuvastatin (CRESTOR) 20 MG tablet Take 1 tablet (20 mg total) by mouth daily.   tamsulosin (FLOMAX) 0.4 MG CAPS capsule Take 1 capsule (0.4 mg total) by mouth daily after supper.   clobetasol cream (TEMOVATE) 0.10 % Apply 1 application topically 2 (two) times daily. Apply 1 application topically 2 (two) times daily as needed. 300 gm is a 90 day supply (Patient not taking: Reported on 02/14/2022)   predniSONE (DELTASONE) 5 MG tablet Take 6 tablets by mouth daily for first day, 5 tabs second day, 4 tabs third day, 3 tabs fourth day, 2 tabs the fifth day, and 1 tab sixth day. (Patient not taking: Reported on 02/14/2022)   No facility-administered encounter medications on file as of 02/14/2022.    PAST MEDICAL HISTORY: Past Medical History:  Diagnosis Date   BPH (benign prostatic hypertrophy) with urinary retention    CAD (coronary artery disease) 04/2008   successful PCI of the lesion  in the proximal LAD using xience drug-eluting stent with improvement in central narrowing from 95% to 0%.  Dr. Olevia Perches   Foley catheter in place    Gout    per pt stable is of 08-18-2014   History of acute bronchitis    recently 07-16-2014   History of sepsis    urosepsis--  08-13-2014 admission   Hyperlipidemia LDL goal <100    Hypertension    Multilevel degenerative disc disease    cervical and lumbar   OSA (obstructive sleep apnea)        Psoriasis    S/P drug eluting coronary stent placement    05-13-2008   DES X1 TO pLAD   Sigmoid diverticulosis     PAST SURGICAL HISTORY: Past Surgical History:  Procedure Laterality Date   BUNIONECTOMY Right 2000   COLONOSCOPY WITH PROPOFOL  last one 10-06-2013   CORONARY ANGIOPLASTY WITH STENT PLACEMENT  05-13-2008  Dr Eustace Quail   PCI w/  DES x1 to  pLAD/  ef 65%   GREEN LIGHT LASER TURP (TRANSURETHRAL RESECTION OF PROSTATE N/A 08/23/2014   Procedure: GREEN LIGHT LASER TURP (TRANSURETHRAL RESECTION OF PROSTATE;  Surgeon: Irine Seal, MD;  [facility];  Service: Urology;  Laterality: N/A;   WRIST GANGLION EXCISION Left 2001    ALLERGIES: Allergies  Allergen Reactions   Lidocaine Anaphylaxis    REACTION: anaphylactic shock (also he was on Neomycin sulfate)   Neomycin Sulfate Anaphylaxis    REACTION: anaphylactic shock (also on Lidocaine  topically)   Sulfa Antibiotics Anaphylaxis   Sulfa Drugs Cross Reactors Anaphylaxis   Hctz [Hydrochlorothiazide] Other (See Comments)    Gout    Tramadol Other (See Comments)    05/26/13 urinary retention   Atrovent [Ipratropium] Other (See Comments)    Caused Urinary retention (this is the nasal spray)   Latex Itching and Rash    Skin irritation    FAMILY HISTORY: Family History  Problem Relation Age of Onset   Hypertension Father    Stroke Father 56   Lung cancer Mother         smoker   Aneurysm Mother    Diabetes Sister    Heart disease Brother 53         stent LAD 2012   Colon cancer Neg Hx    Pancreatic cancer Neg Hx    Rectal cancer Neg Hx    Stomach cancer Neg Hx     SOCIAL HISTORY: Social History   Tobacco Use   Smoking status: Former    Packs/day: 0.75    Years: 14.00    Total pack years: 10.50    Types: Cigarettes    Quit date: 05/27/1997    Years since quitting: 24.7   Smokeless tobacco: Former    Types: Snuff    Quit date: 08/17/1997  Vaping Use   Vaping Use: Never used  Substance Use Topics   Alcohol use: Yes    Comment: occasional   Drug use: No   Social History   Social History Narrative   Patient is married to Rodena Piety) has 1 child two level home   Patient is right handed   Education level is Bachelor's degree   Caffeine consumption is 1 daily     OBJECTIVE: PHYSICAL EXAM: BP 119/68 (BP Location: Left Arm, Patient Position: Sitting, Cuff Size: Normal)   Pulse 70   Ht 5\' 8"  (1.727 m)   Wt 270 lb (122.5 kg)   SpO2 95%   BMI 41.05 kg/m   General: General appearance: Awake and alert. No distress. Cooperative with exam.  Skin: No obvious rash or jaundice. HEENT: Atraumatic. Anicteric. Lungs: Non-labored breathing on room air  Extremities: No edema. No obvious deformity. Psych: Affect appropriate.  Neurological: Mental Status: Alert. Speech fluent. No pseudobulbar affect Cranial Nerves: CNII: No RAPD. Visual fields intact. CNIII, IV, VI: PERRL. No nystagmus. EOMI. CN V: Facial sensation intact bilaterally to fine touch. CN VII: Facial muscles symmetric and strong. Mild bilateral ptosis at rest with no change after sustained upgaze. CN VIII: Hears finger rub well bilaterally. CN IX: No hypophonia. CN X: Palate elevates symmetrically. CN XI: Full strength shoulder shrug bilaterally. CN XII: Tongue protrusion full and midline. No atrophy or fasciculations. No significant dysarthria Motor: Tone is normal. No fasciculations in extremities. No atrophy.  Individual muscle group testing (MRC grade  out of 5):  Movement     Neck flexion  5    Neck extension 5     Right Left   Shoulder abduction 5 5   Elbow flexion 5 5   Elbow extension 5 5   Wrist extension 5 5   Wrist flexion 5 5   Finger abduction - FDI 5 5   Finger abduction - ADM 5 5   Finger extension 5 5   Finger distal flexion - 2/3 5 5    Finger distal flexion - 4/5 5 5    Thumb flexion - FPL 5 5    Hip flexion 5 5   Knee extension 5 5   Knee flexion 5 5   Dorsiflexion 5 5   Plantarflexion 5 5   Great toe extension 5 5   Great toe flexion 5 5     Reflexes:  Right Left   Bicep 2+ 2+   Tricep 2+ 2+   BrRad 2+ 2+   Knee 2+ 2+   Ankle 2+ 2+    Pathological Reflexes: Babinski: flexor response bilaterally Sensation: Pinprick: Intact in all extremities Vibration: Intact in all extremities Proprioception: Intact in bilateral great toes. Coordination: Intact finger-to- nose-finger bilaterally. Romberg negative. Gait: Able to rise from chair with arms crossed unassisted. Narrow-based, antalgic gait. Able to walk on toes and heels.  Lab and Test Review: Internal labs: Normal or unremarkable: folate, TSH, CMP, CBC B12 (09/19/21): 492 Ferritin (12/03/21): 31.3; (09/19/21): 21.7, (06/18/21): 7.8 HbA1c (05/25/21): 6.4 (has been ~6 for at least 9 years)  Lumbar xray (02/07/21): FINDINGS: 5 nonrib bearing lumbar-type vertebral bodies.   Vertebral body heights are maintained. No acute fracture.   No static listhesis. No spondylolysis.   Degenerative disease with disc height loss throughout the thoracolumbar spine most severe at L5-S1. Bilateral facet arthropathy at L5-S1.   SI joints are unremarkable.   IMPRESSION: 1. Lumbar spine spondylosis as described above. 2. No acute osseous injury of the lumbar spine.  MRI lumbar spine wo contrast (05/08/13): FINDINGS:  Normal lumbar alignment.  Negative for fracture or mass lesion.   L1-2: Extruded disc fragment with superior extension of extruded  disc material.  This appears to be touching the tip of the conus but  not definitely compressing the conus. Given the history of urinary  retention, this could be a factor.   L2-3:  Negative   L3-4: Diffuse disc bulging and mild facet degeneration. Mild spinal  stenosis.   L4-5: Mild disc degeneration and disc bulging. Small left foraminal  disc protrusion.   L5-S1:  Negative   IMPRESSION:  Central disc protrusion at L1-2 with extruded disc fragment  extending cranially. Mild displacement of the tip of the conus  medullaris without definite conus compression. Given the history of  urinary retention, this may be contributing.   Lumbar degenerative changes elsewhere as described above.    MRI thoracic spine wo constrast (05/08/13): FINDINGS:  Negative for fracture or mass in the thoracic spine. Hemangioma T5  vertebral body.   Spinal cord signal is normal.   Small central disc protrusion at T5-6 with mild flattening of the  ventral surface of the cord. Mild posterior element hypertrophy and  mild spinal stenosis at this level. Remaining disc spaces appear  normal without stenosis or disc protrusion   IMPRESSION:  Central disc protrusion and mild spinal stenosis at T5-6. Otherwise  negative.    MRI cervical spine wo contrast (05/08/13): FINDINGS:  Mild straightening of the normal cervical lordosis. Bone marrow  signal is within normal limits. Cervical cord shows  no intra  medullary lesions or edema. Motion artifact is present on the  inversion recovery images. Posterior fossa structures appear normal.  There are flow voids present in both vertebral arteries.   C2-C3:  Negative.   C3-C4: There is a small right posterior lateral protrusion  encroaching on the right neural foramen potentially affecting the  right C4 nerve. The left neural foramen appears patent.   C4-C5:  Negative.   C5-C6: There is a small left paracentral disc protrusion producing  mild central stenosis. This just  indents the ventral cord. Right  foraminal encroachment is present associated with uncovertebral  spurring. The left neural foramen appears patent.   C6-C7: Broad-based left paracentral disc protrusion is present  producing mild to moderate central stenosis with flattening of the  ventral cord and indentation. AP diameter of the thecal sac is  between 8 mm and 9 mm. There is left foraminal stenosis due to  bulging disc and uncovertebral spurring. The right neural foramen  appears patent.   C7-T1:  Negative.   IMPRESSION:  1. 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 Right foraminal stenosis potentially affects the right  C6 nerve.  2. C6-C7 broad-based left eccentric disc protrusion producing mild  to moderate central stenosis with flattening of the left ventral  cord. Left foraminal stenosis potentially affects the left C7 nerve.  3. Tiny right C3-C4 posterior lateral protrusion narrows the right  neural foramen potentially affecting the right C4 nerve.   ASSESSMENT: Archibald Magnifico is a 61 y.o. male who presents for evaluation of burning in bilateral feet. He has a relevant medical history of HTN, pre-diabetes, CAD s/p PCI, plaque psoriasis, OSA (on CPAP), gout, left meniscus tear of knee, OA, GERD, former smoker, BPH, and depression. His neurological examination is essentially normal today. Available diagnostic data is significant for HbA1c of 6.4 and B12 of 280 in 04/2021.   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 His risk factors include pre-diabetes and borderline low B12. I will get labs today to look for other treatable causes of neuropathy. We discussed EMG for large fiber neuropathy and skin biopsy for small fiber neuropathy. EMG is likely to be normal given his exam and the skin biopsy would not likely change management, so patient elected to defer both for now. His Lyrica is at the max dose, but topical creams for  flairs can be added. We could also consider adding Cymbalta, but patient is currently on Prozac, so this would have to be tapered first. I will discuss this with his PCP who manages the medication.  PLAN: -Blood work: IFE, SPEP, B6 -Deferred EMG and skin biopsy for now -Continue Lyrica 150 mg BID -Consider adding Cymbalta if patient can be switched from Prozac. Will discuss with PCP. -Capsaicin cream (due to lidocaine cream allergy) -Alpha lipoic acid  600 mg once or twice daily  -Return to clinic in 6 months  The impression above as well as the plan as outlined below were extensively discussed with the patient who voiced understanding. All questions were answered to their satisfaction.  When available, results of the above investigations and possible further recommendations will be communicated to the patient via telephone/MyChart. Patient to call office if not contacted after expected testing turnaround time.   Total time spent reviewing records, interview, history/exam, documentation, and coordination of care on day of encounter:  65 min   Thank you for allowing me to participate in patient's care.  If I  can answer any additional questions, I would be pleased to do so.  Kai Levins, MD   CC: Copland, Gay Filler, MD [address] [place] [id number]  CC: Referring provider: Darreld Mclean, [address] [place],  [place] [id number]

## 2022-02-13 ENCOUNTER — Ambulatory Visit (INDEPENDENT_AMBULATORY_CARE_PROVIDER_SITE_OTHER): Payer: BC Managed Care – PPO | Admitting: Family Medicine

## 2022-02-13 ENCOUNTER — Encounter: Payer: Self-pay | Admitting: Family Medicine

## 2022-02-13 DIAGNOSIS — M216X1 Other acquired deformities of right foot: Secondary | ICD-10-CM

## 2022-02-13 DIAGNOSIS — M25562 Pain in left knee: Secondary | ICD-10-CM | POA: Diagnosis not present

## 2022-02-13 NOTE — Progress Notes (Signed)
  Charles [name] - 61 y.o. male MRN [id number]  Date of birth: Oct 27, 1960  SUBJECTIVE:  Including CC & ROS.  No chief complaint on file.   Charles [name] is a 61 y.o. male that is here for orthotics.    Review of Systems See HPI   HISTORY: Past Medical, Surgical, Social, and Family History Reviewed & Updated per EMR.   Pertinent Historical Findings include:  Past Medical History:  Diagnosis Date   BPH (benign prostatic hypertrophy) with urinary retention    CAD (coronary artery disease) 04/2008   successful PCI of the lesion in the proximal LAD using xience drug-eluting stent with improvement in central narrowing from 95% to 0%.  Dr. Olevia Perches   Foley catheter in place    Gout    per pt stable is of 08-18-2014   History of acute bronchitis    recently 07-16-2014   History of sepsis    urosepsis--  08-13-2014 admission   Hyperlipidemia LDL goal <100    Hypertension    Multilevel degenerative disc disease    cervical and lumbar   OSA (obstructive sleep apnea)        Psoriasis    S/P drug eluting coronary stent placement    05-13-2008   DES X1 TO pLAD   Sigmoid diverticulosis     Past Surgical History:  Procedure Laterality Date   BUNIONECTOMY Right 2000   COLONOSCOPY WITH PROPOFOL  last one 10-06-2013   CORONARY ANGIOPLASTY WITH STENT PLACEMENT  05-13-2008    Dr Eustace Quail   PCI w/  DES x1 to  pLAD/  ef 65%   GREEN LIGHT LASER TURP (TRANSURETHRAL RESECTION OF PROSTATE N/A 08/23/2014   Procedure: GREEN LIGHT LASER TURP (TRANSURETHRAL RESECTION OF PROSTATE;  Surgeon: Irine Seal, MD;  Location: [facility];  Service: Urology;  Laterality: N/A;   WRIST GANGLION EXCISION Left 2001     PHYSICAL EXAM:  VS: There were no vitals taken for this visit. Physical Exam Gen: NAD, alert, cooperative with exam, well-appearing MSK:  Neurovascularly intact    Patient was fitted for a standard, cushioned, semi-rigid orthotic. The orthotic was heated and afterward  the patient stood on the orthotic blank positioned on the orthotic stand. The patient was positioned in subtalar neutral position and 10 degrees of ankle dorsiflexion in a weight bearing stance. After completion of molding, a stable base was applied to the orthotic blank. The blank was ground to a stable position for weight bearing. Size: 9 Pairs: 2 Base: Blue EVA Additional Posting and Padding: lateral posting  The patient ambulated these, and they were very comfortable.     ASSESSMENT & PLAN:   Pronation deformity of ankle, acquired Completed custom orthotics

## 2022-02-13 NOTE — Assessment & Plan Note (Signed)
Completed custom orthotics 

## 2022-02-14 ENCOUNTER — Encounter: Payer: Self-pay | Admitting: Neurology

## 2022-02-14 ENCOUNTER — Ambulatory Visit (INDEPENDENT_AMBULATORY_CARE_PROVIDER_SITE_OTHER): Payer: BC Managed Care – PPO | Admitting: Neurology

## 2022-02-14 ENCOUNTER — Other Ambulatory Visit (INDEPENDENT_AMBULATORY_CARE_PROVIDER_SITE_OTHER): Payer: BC Managed Care – PPO

## 2022-02-14 VITALS — BP 119/68 | HR 70 | Ht 68.0 in | Wt 270.0 lb

## 2022-02-14 DIAGNOSIS — M79671 Pain in right foot: Secondary | ICD-10-CM

## 2022-02-14 DIAGNOSIS — R7303 Prediabetes: Secondary | ICD-10-CM

## 2022-02-14 DIAGNOSIS — G629 Polyneuropathy, unspecified: Secondary | ICD-10-CM

## 2022-02-14 DIAGNOSIS — R209 Unspecified disturbances of skin sensation: Secondary | ICD-10-CM | POA: Diagnosis not present

## 2022-02-14 DIAGNOSIS — E538 Deficiency of other specified B group vitamins: Secondary | ICD-10-CM | POA: Diagnosis not present

## 2022-02-14 DIAGNOSIS — M79672 Pain in left foot: Secondary | ICD-10-CM

## 2022-02-14 NOTE — Patient Instructions (Addendum)
I saw you today for burning in your feet. This is likely due to small fiber neuropathy. Your risk factors include borderline low B12 and pre-diabetes. Discuss pre-diabetes and possible treatment with Dr. Edilia Bo.  I would like to do some labs today to look for other treatable causes.  We discussed EMG and skin biopsy, but given that they wouldn't likely change management currently, we agreed to defer these for now.  Continue Lyrica 150 mg twice daily.  I will talk to Dr. Lorelei Pont about a possible other medication, but we would need to change your current antidepressant medication.  For the flairs you have, I recommend over the counter Capsaicin cream to rub on your feet when you have extreme burning. Wear gloves or it will also get on your hands! I would normally recommend lidocaine cream, but given your allergy, we will avoid this.  I also recommend a supplement called alpha lipoic acid 600 mg once or twice daily. This promotes overall nerve health.  I will be in touch with you when I have the results of your labs and after talking to Dr. Lorelei Pont. Please let me know if you have any questions or concerns in the meantime.  I want to see you back in clinic in about 6 months or sooner if needed.  The physicians and staff at [facility] are committed to providing excellent care. You may receive a survey requesting feedback about your experience at our office. We strive to receive "very good" responses to the survey questions. If you feel that your experience would prevent you from giving the office a "very good " response, please contact our office to try to remedy the situation. We may be reached at [phone]. Thank you for taking the time out of your busy day to complete the survey.   Kai Levins, MD [facility]

## 2022-02-15 ENCOUNTER — Encounter: Payer: Self-pay | Admitting: Neurology

## 2022-02-15 ENCOUNTER — Encounter: Payer: Self-pay | Admitting: Family Medicine

## 2022-02-18 ENCOUNTER — Encounter: Payer: Self-pay | Admitting: Family Medicine

## 2022-02-18 ENCOUNTER — Other Ambulatory Visit: Payer: Self-pay

## 2022-02-18 DIAGNOSIS — M25562 Pain in left knee: Secondary | ICD-10-CM | POA: Diagnosis not present

## 2022-02-18 MED ORDER — METFORMIN HCL 500 MG PO TABS: 500.0000 mg | ORAL_TABLET | Freq: Every day | ORAL | 3 refills | Status: DC

## 2022-02-20 ENCOUNTER — Encounter: Payer: Self-pay | Admitting: Neurology

## 2022-02-20 DIAGNOSIS — M25562 Pain in left knee: Secondary | ICD-10-CM | POA: Diagnosis not present

## 2022-02-20 LAB — PROTEIN ELECTROPHORESIS, SERUM
Albumin ELP: 4.3 g/dL (ref 3.8–4.8)
Alpha 1: 0.2 g/dL (ref 0.2–0.3)
Alpha 2: 0.6 g/dL (ref 0.5–0.9)
Beta 2: 0.3 g/dL (ref 0.2–0.5)
Beta Globulin: 0.4 g/dL (ref 0.4–0.6)
Gamma Globulin: 0.8 g/dL (ref 0.8–1.7)
Total Protein: 6.7 g/dL (ref 6.1–8.1)

## 2022-02-20 LAB — IMMUNOFIXATION ELECTROPHORESIS
IgG (Immunoglobin G), Serum: 899 mg/dL (ref 600–1540)
IgM, Serum: 108 mg/dL (ref 50–300)
Immunoglobulin A: 144 mg/dL (ref 70–320)

## 2022-02-20 LAB — VITAMIN B6: Vitamin B6: 20 ng/mL (ref 2.1–21.7)

## 2022-02-21 ENCOUNTER — Encounter: Payer: Self-pay | Admitting: Family Medicine

## 2022-02-21 ENCOUNTER — Ambulatory Visit (INDEPENDENT_AMBULATORY_CARE_PROVIDER_SITE_OTHER): Payer: BC Managed Care – PPO

## 2022-02-21 ENCOUNTER — Other Ambulatory Visit (INDEPENDENT_AMBULATORY_CARE_PROVIDER_SITE_OTHER): Payer: BC Managed Care – PPO

## 2022-02-21 DIAGNOSIS — Z111 Encounter for screening for respiratory tuberculosis: Secondary | ICD-10-CM | POA: Diagnosis not present

## 2022-02-21 DIAGNOSIS — Z23 Encounter for immunization: Secondary | ICD-10-CM | POA: Diagnosis not present

## 2022-02-25 ENCOUNTER — Other Ambulatory Visit: Payer: Self-pay | Admitting: Neurology

## 2022-02-25 DIAGNOSIS — M25562 Pain in left knee: Secondary | ICD-10-CM | POA: Diagnosis not present

## 2022-02-25 DIAGNOSIS — G629 Polyneuropathy, unspecified: Secondary | ICD-10-CM

## 2022-02-25 MED ORDER — DULOXETINE HCL 30 MG PO CPEP: ORAL_CAPSULE | ORAL | 0 refills | Status: DC

## 2022-02-26 ENCOUNTER — Encounter: Payer: Self-pay | Admitting: Family Medicine

## 2022-02-26 LAB — QUANTIFERON-TB GOLD PLUS
Mitogen-NIL: 7.75 IU/mL
NIL: 0.02 IU/mL
QuantiFERON-TB Gold Plus: NEGATIVE
TB1-NIL: 0 IU/mL
TB2-NIL: 0 IU/mL

## 2022-02-27 DIAGNOSIS — M25562 Pain in left knee: Secondary | ICD-10-CM | POA: Diagnosis not present

## 2022-03-02 DIAGNOSIS — M25562 Pain in left knee: Secondary | ICD-10-CM | POA: Diagnosis not present

## 2022-03-04 ENCOUNTER — Ambulatory Visit (INDEPENDENT_AMBULATORY_CARE_PROVIDER_SITE_OTHER): Payer: BC Managed Care – PPO | Admitting: Family Medicine

## 2022-03-04 ENCOUNTER — Encounter: Payer: Self-pay | Admitting: Family Medicine

## 2022-03-04 VITALS — BP 130/80 | Ht 68.0 in | Wt 270.0 lb

## 2022-03-04 DIAGNOSIS — M1712 Unilateral primary osteoarthritis, left knee: Secondary | ICD-10-CM

## 2022-03-04 DIAGNOSIS — L400 Psoriasis vulgaris: Secondary | ICD-10-CM | POA: Diagnosis not present

## 2022-03-04 DIAGNOSIS — M25562 Pain in left knee: Secondary | ICD-10-CM | POA: Diagnosis not present

## 2022-03-04 NOTE — Patient Instructions (Signed)
Good to see you Please continue the exercises and brace   Please send me a message in MyChart with any questions or updates.  Please see me back as needed.   --Dr. Raeford Razor

## 2022-03-04 NOTE — Progress Notes (Signed)
  Charles [name] - 61 y.o. male MRN [id number]  Date of birth: 1960-11-25  SUBJECTIVE:  Including CC & ROS.  No chief complaint on file.   Charles [name] is a 61 y.o. male that is following up for his left knee pain.  He has been doing well with his physical therapy and home exercises.  He has been more active.  Does not notice the pain as it once was.    Review of Systems See HPI   HISTORY: Past Medical, Surgical, Social, and Family History Reviewed & Updated per EMR.   Pertinent Historical Findings include:  Past Medical History:  Diagnosis Date   BPH (benign prostatic hypertrophy) with urinary retention    CAD (coronary artery disease) 04/2008   successful PCI of the lesion in the proximal LAD using xience drug-eluting stent with improvement in central narrowing from 95% to 0%.  Dr. Olevia Perches   Foley catheter in place    Gout    per pt stable is of 08-18-2014   History of acute bronchitis    recently 07-16-2014   History of sepsis    urosepsis--  08-13-2014 admission   Hyperlipidemia LDL goal <100    Hypertension    Multilevel degenerative disc disease    cervical and lumbar   OSA (obstructive sleep apnea)        Psoriasis    S/P drug eluting coronary stent placement    05-13-2008   DES X1 TO pLAD   Sigmoid diverticulosis     Past Surgical History:  Procedure Laterality Date   BUNIONECTOMY Right 2000   COLONOSCOPY WITH PROPOFOL  last one 10-06-2013   CORONARY ANGIOPLASTY WITH STENT PLACEMENT  05-13-2008    Dr Eustace Quail   PCI w/  DES x1 to  pLAD/  ef 65%   GREEN LIGHT LASER TURP (TRANSURETHRAL RESECTION OF PROSTATE N/A 08/23/2014   Procedure: GREEN LIGHT LASER TURP (TRANSURETHRAL RESECTION OF PROSTATE;  Surgeon: Irine Seal, MD;  Location: [facility];  Service: Urology;  Laterality: N/A;   WRIST GANGLION EXCISION Left 2001     PHYSICAL EXAM:  VS: BP 130/80 (BP Location: Left Arm, Patient Position: Sitting)   Ht 5\' 8"  (1.727 m)   Wt 270 lb (122.5  kg)   BMI 41.05 kg/m  Physical Exam Gen: NAD, alert, cooperative with exam, well-appearing MSK:  Neurovascularly intact       ASSESSMENT & PLAN:   OA (osteoarthritis) of knee Doing well with modalities put in place.  He is more active and denies the pain that he was experiencing previously. -Counseled on home exercise therapy and supportive care. -provided work note.

## 2022-03-04 NOTE — Assessment & Plan Note (Signed)
Doing well with modalities put in place.  He is more active and denies the pain that he was experiencing previously. -Counseled on home exercise therapy and supportive care. -provided work note.

## 2022-03-08 IMAGING — DX DG LUMBAR SPINE COMPLETE 4+V
5 series · 5 of 5 positions shown · non-contrast
Comparison: None.

CLINICAL DATA: Low back pain radiating to the right side.

EXAM:
LUMBAR SPINE - COMPLETE 4+ VIEW

[l-spine ap]
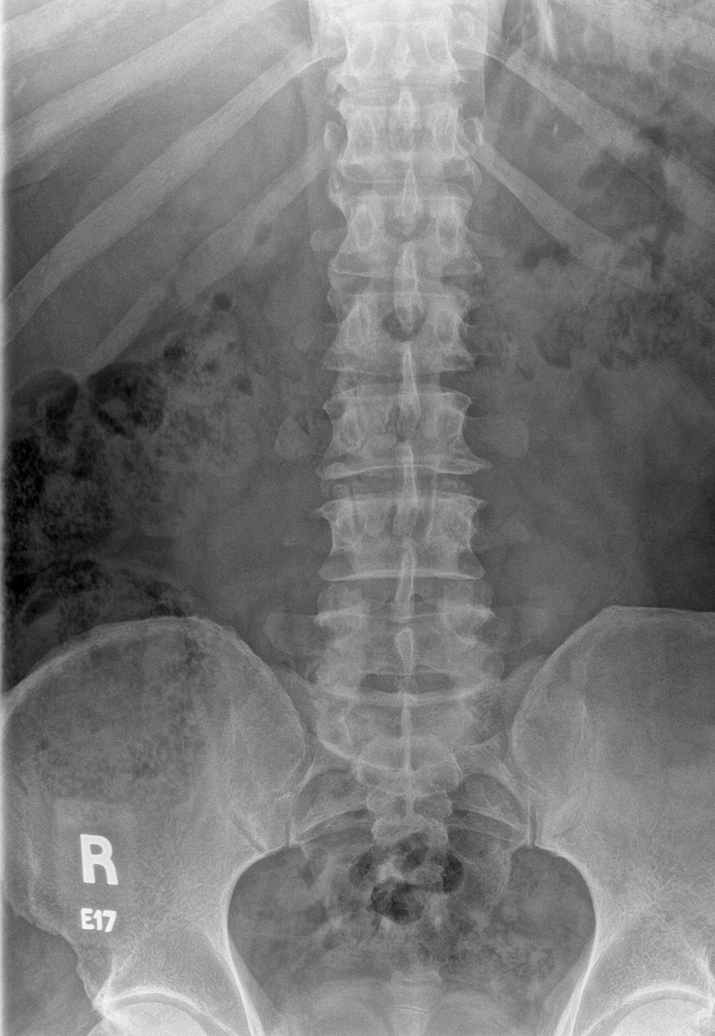

[l-spine obl (1 of 2)]
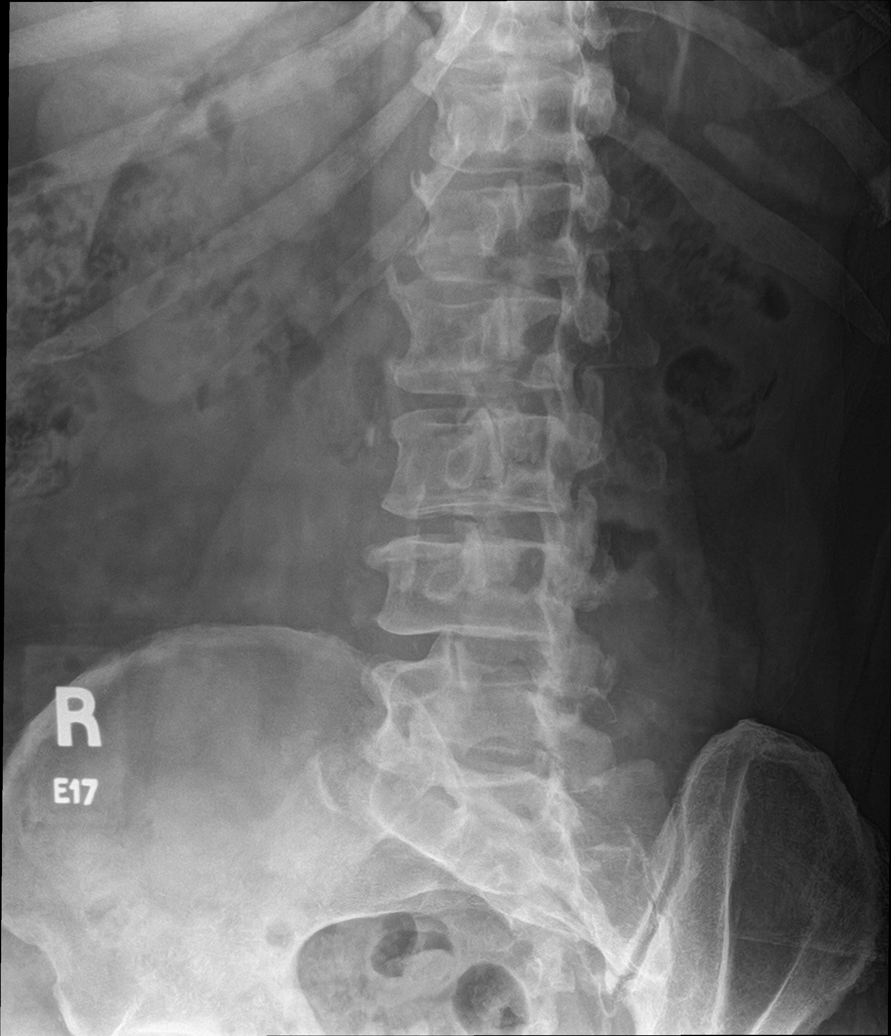

[l-spine obl (2 of 2)]
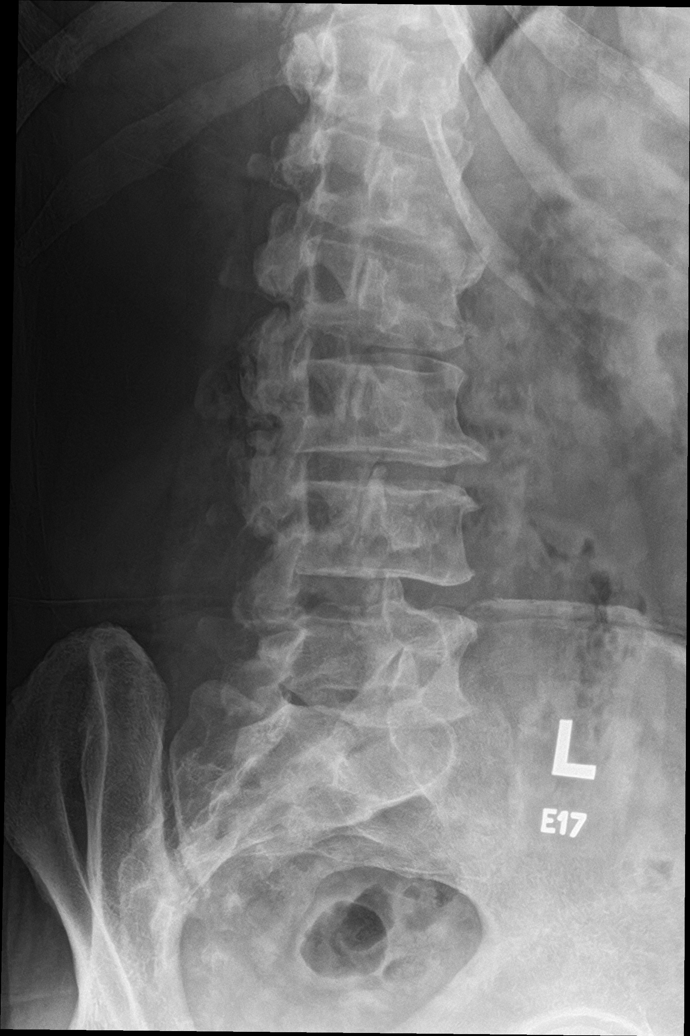

[l-spine lat]
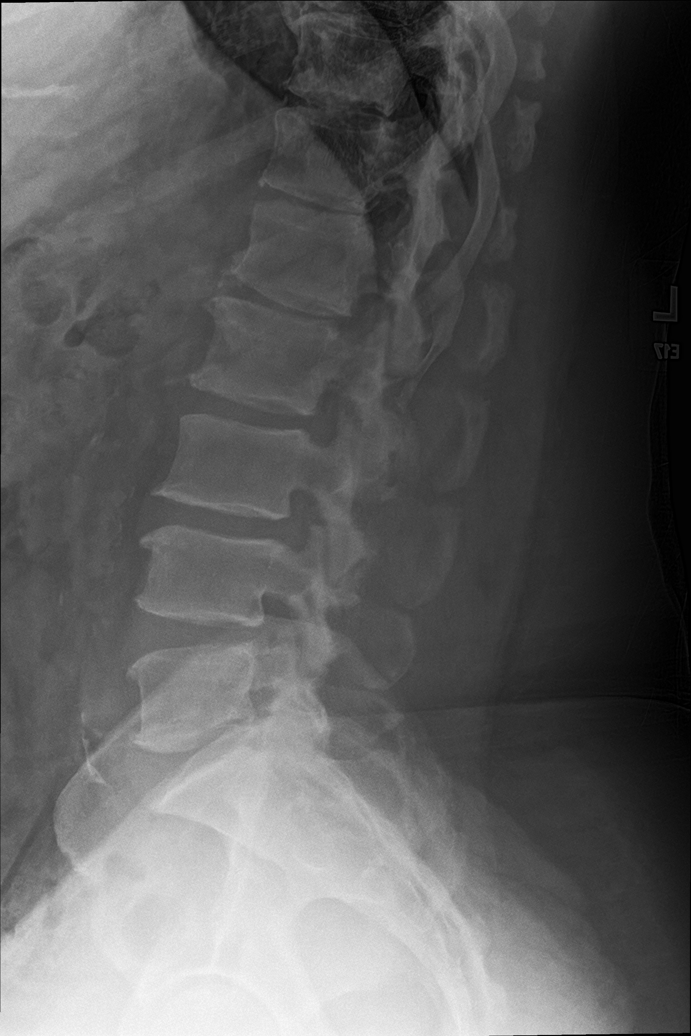

[l-spine spot]
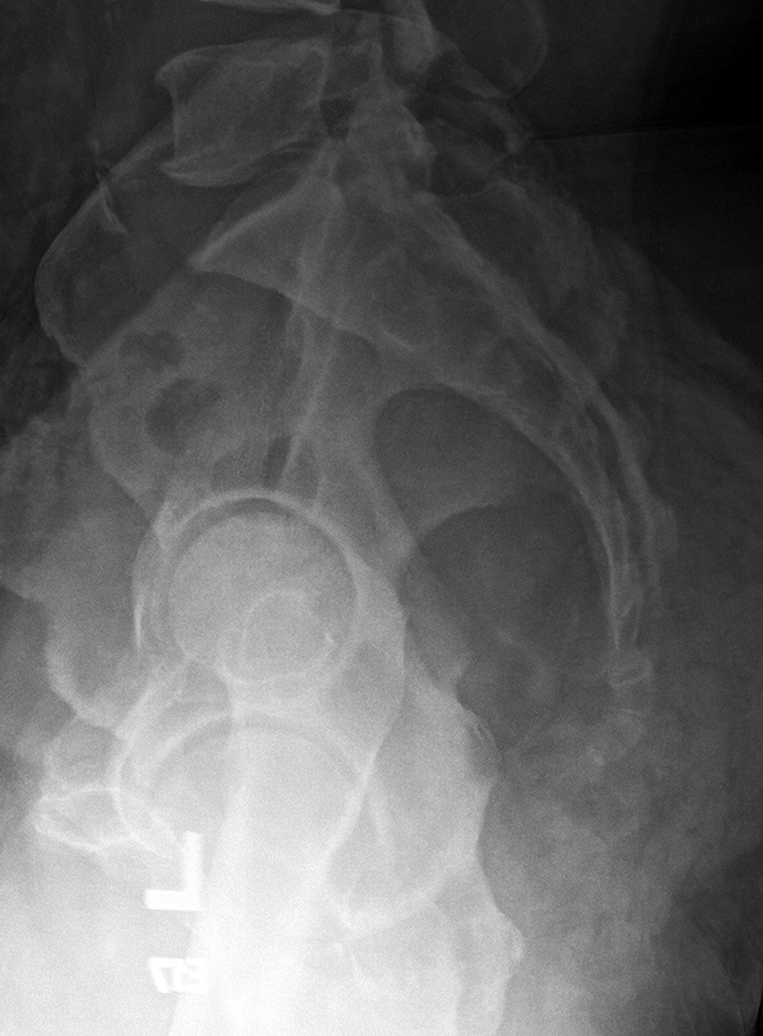

[5 of 5 positions shown; findings below may reference images not displayed]

FINDINGS: 5 nonrib bearing lumbar-type vertebral bodies.

Vertebral body heights are maintained. No acute fracture.

No static listhesis. No spondylolysis.

Degenerative disease with disc height loss throughout the
thoracolumbar spine most severe at L5-S1. Bilateral facet
arthropathy at L5-S1.

SI joints are unremarkable.
IMPRESSION: 1. Lumbar spine spondylosis as described above.
2. No acute osseous injury of the lumbar spine.

## 2022-05-02 ENCOUNTER — Encounter: Payer: Self-pay | Admitting: Neurology

## 2022-05-02 ENCOUNTER — Encounter: Payer: Self-pay | Admitting: Family Medicine

## 2022-05-04 ENCOUNTER — Other Ambulatory Visit: Payer: Self-pay | Admitting: Family Medicine

## 2022-05-04 DIAGNOSIS — Z8739 Personal history of other diseases of the musculoskeletal system and connective tissue: Secondary | ICD-10-CM

## 2022-05-04 DIAGNOSIS — E782 Mixed hyperlipidemia: Secondary | ICD-10-CM

## 2022-05-05 ENCOUNTER — Other Ambulatory Visit: Payer: Self-pay | Admitting: Neurology

## 2022-05-05 DIAGNOSIS — G629 Polyneuropathy, unspecified: Secondary | ICD-10-CM

## 2022-06-05 ENCOUNTER — Encounter: Payer: Self-pay | Admitting: Family Medicine

## 2022-06-06 ENCOUNTER — Other Ambulatory Visit: Payer: Self-pay | Admitting: Neurology

## 2022-06-06 DIAGNOSIS — G629 Polyneuropathy, unspecified: Secondary | ICD-10-CM

## 2022-06-06 DIAGNOSIS — R209 Unspecified disturbances of skin sensation: Secondary | ICD-10-CM

## 2022-06-06 DIAGNOSIS — M79671 Pain in right foot: Secondary | ICD-10-CM

## 2022-06-06 MED ORDER — NORTRIPTYLINE HCL 25 MG PO CAPS: 25.0000 mg | ORAL_CAPSULE | Freq: Every day | ORAL | 3 refills | Status: DC

## 2022-06-06 MED ORDER — NORTRIPTYLINE HCL 25 MG PO CAPS: 25.0000 mg | ORAL_CAPSULE | Freq: Every day | ORAL | 0 refills | Status: DC

## 2022-06-06 MED ORDER — NORTRIPTYLINE HCL 25 MG PO CAPS: 25.0000 mg | ORAL_CAPSULE | Freq: Every day | ORAL | 5 refills | Status: DC

## 2022-06-16 ENCOUNTER — Encounter: Payer: Self-pay | Admitting: Family Medicine

## 2022-06-16 MED ORDER — OMEPRAZOLE 40 MG PO CPDR: 40.0000 mg | DELAYED_RELEASE_CAPSULE | Freq: Every day | ORAL | 3 refills | Status: DC

## 2022-06-18 ENCOUNTER — Other Ambulatory Visit: Payer: Self-pay | Admitting: Neurology

## 2022-06-18 DIAGNOSIS — R209 Unspecified disturbances of skin sensation: Secondary | ICD-10-CM

## 2022-06-18 DIAGNOSIS — G629 Polyneuropathy, unspecified: Secondary | ICD-10-CM

## 2022-06-18 MED ORDER — PREGABALIN 50 MG PO CAPS: ORAL_CAPSULE | ORAL | 3 refills | Status: DC

## 2022-06-19 ENCOUNTER — Encounter: Payer: Self-pay | Admitting: Family Medicine

## 2022-06-19 DIAGNOSIS — F419 Anxiety disorder, unspecified: Secondary | ICD-10-CM

## 2022-06-19 MED ORDER — CLONAZEPAM 0.5 MG PO TABS: 0.5000 mg | ORAL_TABLET | Freq: Two times a day (BID) | ORAL | 1 refills | Status: AC | PRN

## 2022-07-08 ENCOUNTER — Other Ambulatory Visit: Payer: Self-pay | Admitting: Neurology

## 2022-07-16 ENCOUNTER — Other Ambulatory Visit (HOSPITAL_BASED_OUTPATIENT_CLINIC_OR_DEPARTMENT_OTHER): Payer: Self-pay

## 2022-07-16 ENCOUNTER — Encounter: Payer: Self-pay | Admitting: Family

## 2022-07-16 ENCOUNTER — Ambulatory Visit: Payer: BC Managed Care – PPO | Admitting: Family

## 2022-07-16 VITALS — BP 124/78 | HR 82 | Resp 18 | Ht 68.0 in | Wt 279.2 lb

## 2022-07-16 DIAGNOSIS — M5442 Lumbago with sciatica, left side: Secondary | ICD-10-CM | POA: Diagnosis not present

## 2022-07-16 MED ORDER — PREDNISONE 20 MG PO TABS
40.0000 mg | ORAL_TABLET | Freq: Every day | ORAL | 0 refills | Status: DC
  Filled 2022-07-16: qty 10, 5d supply, fill #0

## 2022-07-16 MED ORDER — METHOCARBAMOL 500 MG PO TABS
500.0000 mg | ORAL_TABLET | Freq: Every evening | ORAL | 0 refills | Status: DC | PRN
  Filled 2022-07-16: qty 20, 20d supply, fill #0

## 2022-07-16 NOTE — Progress Notes (Signed)
Charles [name] is a 62 y.o. male with the following history as recorded in [employer]:  Patient Active Problem List   Diagnosis Date Noted   OA (osteoarthritis) of knee 09/19/2021   Pre-diabetes 06/28/2019   Sepsis secondary to UTI ([place]) 11/17/2017   UTI (urinary tract infection) 11/17/2017   Left calcaneal bursitis 12/14/2014   Pronation deformity of ankle, acquired 10/26/2014   Allergic reaction 10/14/2014   Achilles tendinosis 10/05/2014   OSA (obstructive sleep apnea) 08/18/2014   Gout 08/14/2014   Obesity 06/02/2013   Abnormal MRI, spine 05/09/2013   H/O urinary retention 05/09/2013   Psoriasis 05/08/2012   Coronary artery disease 08/03/2008   Hyperlipidemia 11/10/2007   History of gout 11/10/2007   Essential hypertension 11/10/2007   G E R D 01/06/2007    Current Outpatient Medications  Medication Sig Dispense Refill   allopurinol (ZYLOPRIM) 300 MG tablet TAKE 1 TABLET BY MOUTH DAILY 90 tablet 0   aspirin 81 MG tablet Take 1 tablet (81 mg total) by mouth daily. 90 tablet 1   cetirizine (ZYRTEC) 10 MG tablet Take 10 mg by mouth at bedtime.     clonazePAM (KLONOPIN) 0.5 MG tablet Take 1 tablet (0.5 mg total) by mouth 2 (two) times daily as needed for anxiety. 20 tablet 1   Coenzyme Q10 (CO Q 10 PO) Take 1 capsule by mouth every morning. 548m     finasteride (PROSCAR) 5 MG tablet Take 1 tablet (5 mg total) by mouth daily. 30 tablet 0   fluocinonide (LIDEX) 0.05 % external solution Apply 1 application topically 2 (two) times daily. As needed for psoriasis.  300 ml is a 90 day supply 300 mL 3   fluocinonide cream (LIDEX) 0AB-123456789% Apply 1 application topically 2 (two) times daily. As needed for psoriasis 60 g 2   fluticasone (FLONASE) 50 MCG/ACT nasal spray Place 2 sprays into both nostrils daily. 16 g 1   metFORMIN (GLUCOPHAGE) 500 MG tablet Take 1 tablet (500 mg total) by mouth daily. 90 tablet 3   methocarbamol (ROBAXIN) 500 MG tablet Take 1 tablet (500 mg total) by mouth at  bedtime as needed for muscle spasms. 20 tablet 0   metoprolol succinate (TOPROL-XL) 100 MG 24 hr tablet TAKE 1 TABLET BY MOUTH  DAILY 90 tablet 3   Multiple Vitamin (MULTIVITAMIN) capsule Take 1 capsule by mouth every morning.     nitroGLYCERIN (NITROSTAT) 0.3 MG SL tablet Place 1 tablet (0.3 mg total) under the tongue every 5 (five) minutes as needed for chest pain. 25 tablet 2   Omega-3 Fatty Acids (FISH OIL) 1000 MG CAPS Take 1,000 mg by mouth every morning.     omeprazole (PRILOSEC) 40 MG capsule Take 1 capsule (40 mg total) by mouth daily. 90 capsule 3   predniSONE (DELTASONE) 20 MG tablet Take 2 tablets (40 mg total) by mouth daily with breakfast. 10 tablet 0   pregabalin (LYRICA) 150 MG capsule Take 1 capsule (150 mg total) by mouth 2 (two) times daily. 180 capsule 3   pregabalin (LYRICA) 50 MG capsule Take 1 capsule (50 mg) at bedtime with other Lyrica 150 mg capsule for 200 mg total at bedtime for 1 week, then can increase to 1 capsule (50 mg) in addition to morning and bedtime Lyrica 150 mg capsule for 200 mg total twice daily thereafter. 180 capsule 3   rosuvastatin (CRESTOR) 20 MG tablet TAKE 1 TABLET BY MOUTH DAILY 90 tablet 0   tamsulosin (FLOMAX) 0.4 MG CAPS capsule  Take 1 capsule (0.4 mg total) by mouth daily after supper. 30 capsule 0   No current facility-administered medications for this visit.    Allergies: Lidocaine, Neomycin sulfate, Sulfa antibiotics, Sulfa drugs cross reactors, Hctz [hydrochlorothiazide], Tramadol, Atrovent [ipratropium], and Latex  Past Medical History:  Diagnosis Date   BPH (benign prostatic hypertrophy) with urinary retention    CAD (coronary artery disease) 04/2008   successful PCI of the lesion in the proximal LAD using xience drug-eluting stent with improvement in central narrowing from 95% to 0%.  Dr. Olevia Perches   Foley catheter in place    Gout    per pt stable is of 08-18-2014   History of acute bronchitis    recently 07-16-2014   History of  sepsis    urosepsis--  08-13-2014 admission   Hyperlipidemia LDL goal <100    Hypertension    Multilevel degenerative disc disease    cervical and lumbar   OSA (obstructive sleep apnea)        Psoriasis    S/P drug eluting coronary stent placement    05-13-2008   DES X1 TO pLAD   Sigmoid diverticulosis     Past Surgical History:  Procedure Laterality Date   BUNIONECTOMY Right 2000   COLONOSCOPY WITH PROPOFOL  last one 10-06-2013   CORONARY ANGIOPLASTY WITH STENT PLACEMENT  05-13-2008    Dr Eustace Quail   PCI w/  DES x1 to  pLAD/  ef 65%   GREEN LIGHT LASER TURP (TRANSURETHRAL RESECTION OF PROSTATE N/A 08/23/2014   Procedure: GREEN LIGHT LASER TURP (TRANSURETHRAL RESECTION OF PROSTATE;  Surgeon: Irine Seal, MD;  Location: [facility];  Service: Urology;  Laterality: N/A;   WRIST GANGLION EXCISION Left 2001    Family History  Problem Relation Age of Onset   Hypertension Father    Stroke Father 71   Lung cancer Mother         smoker   Aneurysm Mother    Diabetes Sister    Heart disease Brother 42        stent LAD 2012   Colon cancer Neg Hx    Pancreatic cancer Neg Hx    Rectal cancer Neg Hx    Stomach cancer Neg Hx     Social History   Tobacco Use   Smoking status: Former    Packs/day: 0.75    Years: 14.00    Total pack years: 10.50    Types: Cigarettes    Quit date: 05/27/1997    Years since quitting: 25.1   Smokeless tobacco: Former    Types: Snuff    Quit date: 08/17/1997  Substance Use Topics   Alcohol use: Yes    Comment: occasional    Subjective:   2.5 weeks of left hip pain; does feel pain radiating down into lower extremity; job does involve heavy lifting; has been using OTC Naproxen with limited benefit; pain most noticeable in the am when first get up and moving; does feel better with hot water from shower;     Objective:  Vitals:   07/16/22 0903  BP: 124/78  Pulse: 82  Resp: 18  SpO2: 96%  Weight: 279 lb 3.2 oz (126.6 kg)   Height: 5' 8"$  (1.727 m)    General: Well developed, well nourished, in no acute distress  Skin : Warm and dry.  Head: Normocephalic and atraumatic  Lungs: Respirations unlabored;  Musculoskeletal: No deformities; no active joint inflammation  Extremities: No edema, cyanosis, clubbing  Vessels: Symmetric  bilaterally  Neurologic: Alert and oriented; speech intact; face symmetrical; moves all extremities well; CNII-XII intact without focal deficit   Assessment:  1. Acute left-sided back pain with sciatica     Plan:  Rx for Prednisone and Robaxin; continue to apply heat and try to rest; if symptoms persist, to consider imaging and/ or referral to orthopedics.   No follow-ups on file.  No orders of the defined types were placed in this encounter.   Requested Prescriptions   Signed Prescriptions Disp Refills   predniSONE (DELTASONE) 20 MG tablet 10 tablet 0    Sig: Take 2 tablets (40 mg total) by mouth daily with breakfast.   methocarbamol (ROBAXIN) 500 MG tablet 20 tablet 0    Sig: Take 1 tablet (500 mg total) by mouth at bedtime as needed for muscle spasms.

## 2022-07-16 NOTE — Patient Instructions (Signed)
Sciatica  Sciatica is pain, weakness, tingling, or loss of feeling (numbness) along the sciatic nerve. The sciatic nerve starts in the lower back and goes down the back of each leg. Sciatica usually affects one side of the body. Sciatica usually goes away on its own or with treatment. Sometimes, sciatica may come back. What are the causes? This condition happens when the sciatic nerve is pinched or has pressure put on it. This may be caused by: A disk in between the bones of the spine bulging out too far (herniated disk). Changes in the spinal disks due to aging. A condition that affects a muscle in the butt. Extra bone growth near the sciatic nerve. A break (fracture) of the area between your hip bones (pelvis). Pregnancy. Tumor. This is rare. What increases the risk? You are more likely to develop this condition if you: Play sports that put pressure or stress on the spine. Have poor strength and ease of movement (flexibility). Have had a back injury or back surgery. Sit for long periods of time. Do activities that involve bending or lifting over and over again. Are very overweight (obese). What are the signs or symptoms? Symptoms can vary from mild to very bad. They may include: Any of these problems in the lower back, leg, hip, or butt: Mild tingling, loss of feeling, or dull aches. A burning feeling. Sharp pains. Loss of feeling in the back of the calf or the sole of the foot. Leg weakness. Very bad back pain that makes it hard to move. These symptoms may get worse when you cough, sneeze, or laugh. They may also get worse when you sit or stand for long periods of time. How is this treated? This condition often gets better without any treatment. However, treatment may include: Changing or cutting back on physical activity when you have pain. Exercising, including strengthening and stretching. Putting ice or heat on the affected area. Shots of medicines to relieve pain and  swelling or to relax your muscles. Surgery. Follow these instructions at home: Medicines Take over-the-counter and prescription medicines only as told by your doctor. Ask your doctor if you should avoid driving or using machines while you are taking your medicine. Managing pain     If told, put ice on the affected area. To do this: Put ice in a plastic bag. Place a towel between your skin and the bag. Leave the ice on for 20 minutes, 2-3 times a day. If your skin turns bright red, take off the ice right away to prevent skin damage. The risk of skin damage is higher if you cannot feel pain, heat, or cold. If told, put heat on the affected area. Do this as often as told by your doctor. Use the heat source that your doctor tells you to use, such as a moist heat pack or a heating pad. Place a towel between your skin and the heat source. Leave the heat on for 20-30 minutes. If your skin turns bright red, take off the heat right away to prevent burns. The risk of burns is higher if you cannot feel pain, heat, or cold. Activity  Return to your normal activities when your doctor says that it is safe. Avoid activities that make your symptoms worse. Take short rests during the day. When you rest for a long time, do some physical activity or stretching between periods of rest. Avoid sitting for a long time without moving. Get up and move around at least one time each  hour. Do exercises and stretches as told by your doctor. Do not lift anything that is heavier than 10 lb (4.5 kg). Avoid lifting heavy things even when you do not have symptoms. Avoid lifting heavy things over and over. When you lift objects, always lift in a way that is safe for your body. To do this, you should: Bend your knees. Keep the object close to your body. Avoid twisting. General instructions Stay at a healthy weight. Wear comfortable shoes that support your feet. Avoid wearing high heels. Avoid sleeping on a mattress  that is too soft or too hard. You might have less pain if you sleep on a mattress that is firm enough to support your back. Contact a doctor if: Your pain is not controlled by medicine. Your pain does not get better. Your pain gets worse. Your pain lasts longer than 4 weeks. You lose weight without trying. Get help right away if: You cannot control when you pee (urinate) or poop (have a bowel movement). You have weakness in any of these areas and it gets worse: Lower back. The area between your hip bones. Butt. Legs. You have redness or swelling of your back. You have a burning feeling when you pee. Summary Sciatica is pain, weakness, tingling, or loss of feeling (numbness) along the sciatic nerve. This may include the lower back, legs, hips, and butt. This condition happens when the sciatic nerve is pinched or has pressure put on it. Treatment often includes rest, exercise, medicines, and putting ice or heat on the affected area. This information is not intended to replace advice given to you by your health care provider. Make sure you discuss any questions you have with your health care provider. Document Revised: 08/20/2021 Document Reviewed: 08/20/2021 Elsevier Patient Education  [employer].

## 2022-07-18 ENCOUNTER — Other Ambulatory Visit: Payer: Self-pay | Admitting: Family Medicine

## 2022-07-18 DIAGNOSIS — Z8739 Personal history of other diseases of the musculoskeletal system and connective tissue: Secondary | ICD-10-CM

## 2022-07-18 DIAGNOSIS — E782 Mixed hyperlipidemia: Secondary | ICD-10-CM

## 2022-07-20 ENCOUNTER — Encounter: Payer: Self-pay | Admitting: Family Medicine

## 2022-07-21 ENCOUNTER — Encounter: Payer: Self-pay | Admitting: Family

## 2022-07-21 MED ORDER — OMEPRAZOLE 20 MG PO CPDR: 20.0000 mg | DELAYED_RELEASE_CAPSULE | Freq: Two times a day (BID) | ORAL | 3 refills | Status: DC

## 2022-07-22 ENCOUNTER — Other Ambulatory Visit: Payer: Self-pay

## 2022-07-22 MED ORDER — OMEPRAZOLE 20 MG PO CPDR: 20.0000 mg | DELAYED_RELEASE_CAPSULE | Freq: Two times a day (BID) | ORAL | 3 refills | Status: DC

## 2022-07-23 ENCOUNTER — Other Ambulatory Visit: Payer: Self-pay | Admitting: Family

## 2022-07-23 DIAGNOSIS — M5442 Lumbago with sciatica, left side: Secondary | ICD-10-CM

## 2022-07-25 ENCOUNTER — Telehealth: Payer: Self-pay | Admitting: *Deleted

## 2022-07-25 NOTE — Telephone Encounter (Signed)
Prior auth started via cover my meds.  Awaiting determination.   Key: [id number]

## 2022-07-25 NOTE — Telephone Encounter (Signed)
Omeprazole Cap '20mg'$  NAME: Charles [name] PATIENT Member ID: [id number] NAME: Case number: [id number] NEXT You can now fill your prescription for this medication. STEPS: VALID 07/25/2022 - 07/25/2023 THROUGH:

## 2022-07-30 ENCOUNTER — Ambulatory Visit (INDEPENDENT_AMBULATORY_CARE_PROVIDER_SITE_OTHER): Payer: BC Managed Care – PPO

## 2022-07-30 ENCOUNTER — Ambulatory Visit: Payer: BC Managed Care – PPO | Admitting: Orthopedic Surgery

## 2022-07-30 DIAGNOSIS — M545 Low back pain, unspecified: Secondary | ICD-10-CM

## 2022-07-30 NOTE — Progress Notes (Signed)
Orthopedic Spine Surgery Office Note  Assessment: Patient is a 62 y.o. male with left-sided low back pain that radiates into his left buttock and left posterior thigh   Plan: -Explained that initially conservative treatment is tried as a significant number of patients may experience relief with these treatment modalities. Discussed that the conservative treatments include:  -activity modification  -physical therapy  -over the counter pain medications  -medrol dosepak  -lumbar steroid injections -Patient has tried robaxin, medrol dose pak, activity modification  -Recommended continue with the Robaxin and provided him with a home exercise program to do in addition to that -Patient's BMI is over 40, so I let him know that if surgery is ever considered as a treatment option, he would need to get his weight down to 260 pounds -Will call the patient in 2 weeks he is not doing any better, then we will order an MRI of his lumbar spine because he would have tried 6 weeks of conservative treatment without any relief under Carolyne Fiscal care -Patient should return to office in 6 weeks, x-rays at next visit: None   Patient expressed understanding of the plan and all questions were answered to the patient's satisfaction.   ___________________________________________________________________________   History:  Patient is a 62 y.o. male who presents today for lumbar spine.  Patient has had about 4 weeks of low back pain on the left side that radiates into his left buttock and left posterior thigh.  Pain is worse when he is at work.  He says he does a lot of bending over and working on his knees.  He repairs x-ray equipment.  He states the job can be quite physical.  Pain is also worse in the morning when he gets up.  Generally, at the end of the day the pain is a little bit better.  There is also better if he lays down.  No symptoms on the right side.  There was no trauma or injury that brought on the  pain.   Weakness: Denies Symptoms of imbalance: Denies Paresthesias and numbness: Yes, gets numbness and tingling in his toes from neuropathy.  No new numbness or tingling Bowel or bladder incontinence: Denies Saddle anesthesia: Denies  Treatments tried: robaxin, medrol dose pak, activity modification  Review of systems: Denies fevers and chills, night sweats, unexplained weight loss, history of cancer, pain that wakes them at night  Past medical history: CAD HLD BPH Gout OSA Psoriasis Diverticulosis Prediabetes Neuropathy  Allergies: lidocaine, neomycin, sulfa, HCTZ, tramadol, atrovent, latex  Past surgical history:  Bunionectomy Coronary stent placement TURP Left wrist ganglion excision  Social history: Denies use of nicotine product (smoking, vaping, patches, smokeless) Alcohol use: Denies Denies recreational drug use   Physical Exam:  General: no acute distress, appears stated age Neurologic: alert, answering questions appropriately, following commands Respiratory: unlabored breathing on room air, symmetric chest rise Psychiatric: appropriate affect, normal cadence to speech   MSK (spine):  -Strength exam      Left  Right EHL    5/5  5/5 TA    5/5  5/5 GSC    5/5  5/5 Knee extension  5/5  5/5 Hip flexion   5/5  5/5  -Sensory exam    Sensation intact to light touch in L3-S1 nerve distributions of bilateral lower extremities  -Achilles DTR: 2/4 on the left, 2/4 on the right -Patellar tendon DTR: 2/4 on the left, 2/4 on the right  -Straight leg raise: Negative -Contralateral straight leg raise: Negative -  Femoral nerve stretch test: Negative bilaterally -Clonus: no beats bilaterally  -Left hip exam: No pain through range of motion, negative FADIR, negative FABER, negative Stinchfield -Right hip exam: No pain through range of motion  Imaging: XR of the lumbar spine from 07/30/2022 was independently reviewed and interpreted, showing disc height  loss at L3/4 and L5/S1. No evidence of instability on flexion/extension views. No fracture or dislocation.    Patient name: Charles [name] Patient MRN: [id number] Date of visit: 07/30/22

## 2022-07-31 DIAGNOSIS — H02821 Cysts of right upper eyelid: Secondary | ICD-10-CM | POA: Diagnosis not present

## 2022-08-14 ENCOUNTER — Telehealth: Payer: Self-pay | Admitting: Orthopedic Surgery

## 2022-08-14 NOTE — Telephone Encounter (Signed)
Orthopedic Telephone Note  Called the patient this morning to check in on his symptoms.  I left a voicemail and he returned my call a little later in the morning.  He left a detailed message stating that his symptoms have been getting better with time.  He felt that since they are getting better we can hold off on the MRI.  I agree with that plan and if he should get return or worsening symptoms, then would get an MRI.  For now, can continue with conservative treatments.  At her last visit, I recommended follow-up in 6 weeks so he should be following up with me in about 4 weeks to go over symptoms again.  Callie Fielding, MD Orthopedic Surgeon

## 2022-08-14 NOTE — Progress Notes (Signed)
NEUROLOGY FOLLOW UP OFFICE NOTE  [name] [id number]  Subjective:  Charles [name] is a 62 y.o. year old right-handed male with a medical history of HTN, pre-diabetes, CAD s/p PCI, plaque psoriasis, OSA (on CPAP), gout, left meniscus tear of knee, OA, GERD, former smoker, BPH, and depression who we last saw on 02/14/22.  To briefly review: Patient's symptoms started about 1 year ago. His job requires a lot of walking and physical activity on his feet. He rarely gets to get off his feet. He noticed his feet would burn late in day. He noticed it on the top of his foot into his toes. The burning got worse as night went on. He could still get on his feet and walk, but this did not help the sensation. Sitting could make it better, but would get worse at night. If he tried to ignore it, eventually he could go to sleep. When it first occurred though, he could not sleep. Walking makes the symptoms worse. If he rests, it can improve some after about 20-30 minutes. Of note, patient is currently on disability due to left knee problems. He is on his feet less now as a result. Things seemed a little better when off his feet. Now when he walks, he feels it may be even more intense. Over the past year, his symptoms are about the same and not spread. He denies numbness and tingling.    Now his symptoms can range from 2/10 to 9/10 discomfort. He thinks the symptoms average about 4/10.   He denies cramps or twitching. He has no symptoms in his upper extremities. He denies motor weakness. He denies imbalance or falls.   The patient does not report symptoms referable to autonomic dysfunction including impaired sweating, heat or cold intolerance, excessive mucosal dryness, gastroparetic early satiety, postprandial abdominal bloating, constipation, bowel or bladder dyscontrol. He does have some erectile dysfunction for many years (sees urology for BPH). He occasionally has lightheadedness when standing, which has  been present for many years and attributed to BP med use (metoprolol).   The patient does not report any constitutional symptoms like fever, night sweats, anorexia or unintentional weight loss.   EtOH use: None currently, used to drink 1-2 beers with dinner on weekends  Restrictive diet? No Family history of neuropathy/myopathy/NM disease? No   Patient was referred by his PCP, Dr. Lorelei Pont. Per 06/18/21 office note, patient has had foot pain since 01/2021 that is worse when he is on his feet. Symptoms are also worse at night. Patient was put on gabapentin which was changed to Lyrica after 10 days as gabapentin did not seem to change symptoms. Lyrica is helping a little bit. B12 was borderline low at that time, so B12 supplementation was added (1000 mcg daily), he is taking it. Lyrica was increased to 150 mg BID on 08/27/21. This changed helped a little. He is not completely sure because he still has symptoms. He will occasionally have a big flair and wonders if the Lyrica should help with this. Arterial studies of the legs on 12/04/21 were normal.   He is also currently on fluoxetine 20 mg daily for mood and klonopin 0.5 mg PRN for anxiety.  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 His risk factors include pre-diabetes and borderline low B12. I will get labs today to look for other treatable causes of neuropathy. We discussed EMG for large fiber neuropathy and skin biopsy for small fiber  neuropathy. EMG is likely to be normal given his exam and the skin biopsy would not likely change management, so patient elected to defer both for now. His Lyrica is at the max dose, but topical creams for flairs can be added. We could also consider adding Cymbalta, but patient is currently on Prozac, so this would have to be tapered first. I will discuss this with his PCP who manages the medication.   PLAN: -Blood work: IFE, SPEP, B6 -Deferred EMG and  skin biopsy for now -Continue Lyrica 150 mg BID -Consider adding Cymbalta if patient can be switched from Prozac. Will discuss with PCP. -Capsaicin cream (due to lidocaine cream allergy) -Alpha lipoic acid  600 mg once or twice daily  Since their last visit: Patient did not tolerate capsaicin cream (extreme burning). Patient tapered fluoxetine and started Cymbalta in 02/2022. Patient had side effects to Cymbalta, so this was reduced, then eventually stopped. Nortriptyline 25 mg qhs was started ~06/17/2022. This was not tolerated (dizziness), so was stopped ~06/17/22 and fluoxetine was restarted. Lyrica was increased to 200 mg BID. This increase has not significantly helped, but maybe symptoms calm down quicker when patient has not been on his feet.  Patient saw Dr. Laurance Flatten in spine due to back pain radiating into left leg. This has improved though he has a history of low back pain and pinched nerves in the back.  Patient has ongoing left knee pain that is now also affecting the right knee. He is considering recontacting ortho about this.  MEDICATIONS:  Outpatient Encounter Medications as of 08/21/2022  Medication Sig   allopurinol (ZYLOPRIM) 300 MG tablet TAKE 1 TABLET BY MOUTH DAILY   aspirin 81 MG tablet Take 1 tablet (81 mg total) by mouth daily.   cetirizine (ZYRTEC) 10 MG tablet Take 10 mg by mouth at bedtime.   clonazePAM (KLONOPIN) 0.5 MG tablet Take 1 tablet (0.5 mg total) by mouth 2 (two) times daily as needed for anxiety.   Coenzyme Q10 (CO Q 10 PO) Take 1 capsule by mouth every morning. 500mg    finasteride (PROSCAR) 5 MG tablet Take 1 tablet (5 mg total) by mouth daily.   fluocinonide (LIDEX) 0.05 % external solution Apply 1 application topically 2 (two) times daily. As needed for psoriasis.  300 ml is a 90 day supply   fluocinonide cream (LIDEX) [id number] % Apply 1 application topically 2 (two) times daily. As needed for psoriasis   fluticasone (FLONASE) 50 MCG/ACT nasal spray Place 2  sprays into both nostrils daily.   metFORMIN (GLUCOPHAGE) 500 MG tablet Take 1 tablet (500 mg total) by mouth daily.   metoprolol succinate (TOPROL-XL) 100 MG 24 hr tablet TAKE 1 TABLET BY MOUTH  DAILY   Multiple Vitamin (MULTIVITAMIN) capsule Take 1 capsule by mouth every morning.   nitroGLYCERIN (NITROSTAT) 0.3 MG SL tablet Place 1 tablet (0.3 mg total) under the tongue every 5 (five) minutes as needed for chest pain.   Omega-3 Fatty Acids (FISH OIL) 1000 MG CAPS Take 1,000 mg by mouth every morning.   omeprazole (PRILOSEC) 20 MG capsule Take 1 capsule (20 mg total) by mouth 2 (two) times daily before a meal.   pregabalin (LYRICA) 150 MG capsule Take 1 capsule (150 mg total) by mouth 2 (two) times daily.   pregabalin (LYRICA) 50 MG capsule Take 1 capsule (50 mg) at bedtime with other Lyrica 150 mg capsule for 200 mg total at bedtime for 1 week, then can increase to 1 capsule (50 mg)  in addition to morning and bedtime Lyrica 150 mg capsule for 200 mg total twice daily thereafter.   rosuvastatin (CRESTOR) 20 MG tablet TAKE 1 TABLET BY MOUTH DAILY   tamsulosin (FLOMAX) 0.4 MG CAPS capsule Take 1 capsule (0.4 mg total) by mouth daily after supper.   [DISCONTINUED] methocarbamol (ROBAXIN) 500 MG tablet Take 1 tablet (500 mg total) by mouth at bedtime as needed for muscle spasms.   [DISCONTINUED] predniSONE (DELTASONE) 20 MG tablet Take 2 tablets (40 mg total) by mouth daily with breakfast.   No facility-administered encounter medications on file as of 08/21/2022.    PAST MEDICAL HISTORY: Past Medical History:  Diagnosis Date   BPH (benign prostatic hypertrophy) with urinary retention    CAD (coronary artery disease) 04/2008   successful PCI of the lesion in the proximal LAD using xience drug-eluting stent with improvement in central narrowing from 95% to 0%.  Dr. Olevia Perches   Foley catheter in place    Gout    per pt stable is of 08-18-2014   History of acute bronchitis    recently 07-16-2014    History of sepsis    urosepsis--  08-13-2014 admission   Hyperlipidemia LDL goal <100    Hypertension    Multilevel degenerative disc disease    cervical and lumbar   OSA (obstructive sleep apnea)        Psoriasis    S/P drug eluting coronary stent placement    05-13-2008   DES X1 TO pLAD   Sigmoid diverticulosis     PAST SURGICAL HISTORY: Past Surgical History:  Procedure Laterality Date   BUNIONECTOMY Right 2000   COLONOSCOPY WITH PROPOFOL  last one 10-06-2013   CORONARY ANGIOPLASTY WITH STENT PLACEMENT  05-13-2008    Dr Eustace Quail   PCI w/  DES x1 to  pLAD/  ef 65%   GREEN LIGHT LASER TURP (TRANSURETHRAL RESECTION OF PROSTATE N/A 08/23/2014   Procedure: GREEN LIGHT LASER TURP (TRANSURETHRAL RESECTION OF PROSTATE;  Surgeon: Irine Seal, MD;  Location: [facility];  Service: Urology;  Laterality: N/A;   WRIST GANGLION EXCISION Left 2001    ALLERGIES: Allergies  Allergen Reactions   Lidocaine Anaphylaxis    REACTION: anaphylactic shock (also he was on Neomycin sulfate)   Neomycin Sulfate Anaphylaxis    REACTION: anaphylactic shock (also on Lidocaine  topically)   Sulfa Antibiotics Anaphylaxis   Sulfa Drugs Cross Reactors Anaphylaxis   Hctz [Hydrochlorothiazide] Other (See Comments)    Gout    Tramadol Other (See Comments)    05/26/13 urinary retention   Atrovent [Ipratropium] Other (See Comments)    Caused Urinary retention (this is the nasal spray)   Latex Itching and Rash    Skin irritation    FAMILY HISTORY: Family History  Problem Relation Age of Onset   Hypertension Father    Stroke Father 58   Lung cancer Mother         smoker   Aneurysm Mother    Diabetes Sister    Heart disease Brother 37        stent LAD 2012   Colon cancer Neg Hx    Pancreatic cancer Neg Hx    Rectal cancer Neg Hx    Stomach cancer Neg Hx     SOCIAL HISTORY: Social History   Tobacco Use   Smoking status: Former    Packs/day: 0.75    Years: 14.00     Additional pack years: 0.00    Total pack years:  10.50    Types: Cigarettes    Quit date: 05/27/1997    Years since quitting: 25.2   Smokeless tobacco: Former    Types: Snuff    Quit date: 08/17/1997  Vaping Use   Vaping Use: Never used  Substance Use Topics   Alcohol use: Yes    Comment: occasional   Drug use: No   Social History   Social History Narrative   Patient is married to Rodena Piety) has 1 child two level home   Patient is right handed   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  Patient appears well-groomed.   Head:  Normocephalic/atraumatic  Neurological Exam: Mental status: alert and oriented, speech fluent and not dysarthric, language intact.  Cranial nerves: CN I: not tested CN II: pupils equal, round and reactive to light, visual fields intact CN III, IV, VI:  full range of motion, no nystagmus, no ptosis CN V: facial sensation intact. CN VII: upper and lower face symmetric CN VIII: hearing intact CN IX, X: uvula midline CN XI: sternocleidomastoid and trapezius muscles intact CN XII: tongue midline  Bulk & Tone: normal, no fasciculations. Motor:  muscle strength 5/5 throughout Deep Tendon Reflexes:  2+ throughout,  toes downgoing.   Sensation:  Vibratory sensation intact. Pinprick reduced on the top of the right foot to 40% of normal, otherwise intact. Finger to nose testing:  Without dysmetria.   Gait:  Normal station and stride.  Romberg negative.  Labs and Imaging review: New results: 02/14/22: B6: wnl IFE: No M protein  SPEP: No M protein   Previously reviewed results: Internal labs: Normal or unremarkable: folate, TSH, CMP, CBC B12 (09/19/21): 492 Ferritin (12/03/21): 31.3; (09/19/21): 21.7, (06/18/21): 7.8 HbA1c (05/25/21):  6.4 (has been ~6 for at least 9 years)   Lumbar xray (02/07/21): FINDINGS: 5 nonrib bearing lumbar-type vertebral bodies.   Vertebral body heights are maintained. No acute fracture.   No static listhesis. No spondylolysis.   Degenerative disease with disc height loss throughout the thoracolumbar spine most severe at L5-S1. Bilateral facet arthropathy at L5-S1.   SI joints are unremarkable.   IMPRESSION: 1. Lumbar spine spondylosis as described above. 2. No acute osseous injury of the lumbar spine.   MRI lumbar spine wo contrast (05/08/13): FINDINGS:  Normal lumbar alignment.  Negative for fracture or mass lesion.   L1-2: Extruded disc fragment with superior extension of extruded  disc material. This appears to be touching the tip of the conus but  not definitely compressing the conus. Given the history of urinary  retention, this could be a factor.   L2-3:  Negative   L3-4: Diffuse disc bulging and mild facet degeneration. Mild spinal  stenosis.   L4-5: Mild disc degeneration and disc bulging. Small left foraminal  disc protrusion.   L5-S1:  Negative   IMPRESSION:  Central disc protrusion at L1-2 with extruded disc fragment  extending cranially. Mild displacement of the tip of the conus  medullaris without definite conus compression. Given the history of  urinary retention, this may be contributing.   Lumbar degenerative changes elsewhere as described above.     MRI thoracic spine wo constrast (05/08/13): FINDINGS:  Negative for fracture or mass in the thoracic spine. Hemangioma T5  vertebral body.   Spinal cord signal is normal.   Small central disc protrusion at T5-6 with mild flattening of the  ventral surface of the cord. Mild posterior element hypertrophy and  mild spinal stenosis at this level. Remaining disc spaces appear  normal without stenosis or disc protrusion   IMPRESSION:  Central disc protrusion and mild spinal stenosis at T5-6. Otherwise   negative.     MRI cervical spine wo contrast (05/08/13): FINDINGS:  Mild straightening of the normal cervical lordosis. Bone marrow  signal is within normal limits. Cervical cord shows no intra  medullary lesions or edema. Motion artifact is present on the  inversion recovery images. Posterior fossa structures appear normal.  There are flow voids present in both vertebral arteries.   C2-C3:  Negative.   C3-C4: There is a small right posterior lateral protrusion  encroaching on the right neural foramen potentially affecting the  right C4 nerve. The left neural foramen appears patent.   C4-C5:  Negative.   C5-C6: There is a small left paracentral disc protrusion producing  mild central stenosis. This just indents the ventral cord. Right  foraminal encroachment is present associated with uncovertebral  spurring. The left neural foramen appears patent.   C6-C7: Broad-based left paracentral disc protrusion is present  producing mild to moderate central stenosis with flattening of the  ventral cord and indentation. AP diameter of the thecal sac is  between 8 mm and 9 mm. There is left foraminal stenosis due to  bulging disc and uncovertebral spurring. The right neural foramen  appears patent.   C7-T1:  Negative.   IMPRESSION:  1. 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 Right foraminal stenosis potentially affects the right  C6 nerve.  2. C6-C7 broad-based left eccentric disc protrusion producing mild  to moderate central stenosis with flattening of the left ventral  cord. Left foraminal stenosis potentially affects the left C7 nerve.  3. Tiny right C3-C4 posterior lateral protrusion narrows the right  neural foramen potentially affecting the right C4 nerve.   Assessment/Plan:  [name], a 62 y.o. male with presumed small fiber neuropathy. His risk factors include pre-diabetes and borderline low B12. He also has a history of  low back pain and possibly radiculopathy that could be contributing to his symptoms. Patient has never had an EMG. Given symptoms have been difficult to control without side effects and there is some asymmetry of sensory exam, EMG would be helpful.  Plan: -EMG: PN (R > L) -Continue Lyrica 200 mg BID -Lidocaine cream PRN  -B12 1000 mcg daily  Return to clinic in 6 months  Kai Levins, MD

## 2022-08-16 ENCOUNTER — Ambulatory Visit: Payer: BC Managed Care – PPO | Admitting: Neurology

## 2022-08-21 ENCOUNTER — Encounter: Payer: Self-pay | Admitting: Neurology

## 2022-08-21 ENCOUNTER — Encounter: Payer: Self-pay | Admitting: Orthopedic Surgery

## 2022-08-21 ENCOUNTER — Ambulatory Visit: Payer: BC Managed Care – PPO | Admitting: Neurology

## 2022-08-21 VITALS — BP 132/73 | HR 70 | Ht 68.0 in | Wt 274.6 lb

## 2022-08-21 DIAGNOSIS — G629 Polyneuropathy, unspecified: Secondary | ICD-10-CM

## 2022-08-21 DIAGNOSIS — E538 Deficiency of other specified B group vitamins: Secondary | ICD-10-CM

## 2022-08-21 DIAGNOSIS — R209 Unspecified disturbances of skin sensation: Secondary | ICD-10-CM | POA: Diagnosis not present

## 2022-08-21 DIAGNOSIS — M545 Low back pain, unspecified: Secondary | ICD-10-CM

## 2022-08-21 DIAGNOSIS — M79671 Pain in right foot: Secondary | ICD-10-CM

## 2022-08-21 DIAGNOSIS — R7303 Prediabetes: Secondary | ICD-10-CM | POA: Diagnosis not present

## 2022-08-21 DIAGNOSIS — G8929 Other chronic pain: Secondary | ICD-10-CM

## 2022-08-21 DIAGNOSIS — M79672 Pain in left foot: Secondary | ICD-10-CM

## 2022-08-21 NOTE — Patient Instructions (Signed)
I would like to investigate your symptoms further with a muscle and nerve test called an EMG (see more information below).  We will discuss results and any possible next steps after the EMG.  Continue Lyrica 200 mg twice daily for now.  You can also try Lidocaine cream as needed. Apply wear you have pain, tingling, or burning. Wear gloves to prevent your hands being numb. This can be bought over the counter at any drug store or online.  I would like to see you back in clinic in 6 months.  The physicians and staff at [facility] are committed to providing excellent care. You may receive a survey requesting feedback about your experience at our office. We strive to receive "very good" responses to the survey questions. If you feel that your experience would prevent you from giving the office a "very good " response, please contact our office to try to remedy the situation. We may be reached at [phone]. Thank you for taking the time out of your busy day to complete the survey.  Kai Levins, MD [facility]  ELECTROMYOGRAM AND NERVE CONDUCTION STUDIES (EMG/NCS) INSTRUCTIONS  How to Prepare The neurologist conducting the EMG will need to know if you have certain medical conditions. Tell the neurologist and other EMG lab personnel if you: Have a pacemaker or any other electrical medical device Take blood-thinning medications Have hemophilia, a blood-clotting disorder that causes prolonged bleeding Bathing Take a shower or bath shortly before your exam in order to remove oils from your skin. Don't apply lotions or creams before the exam.  What to Expect You'll likely be asked to change into a hospital gown for the procedure and lie down on an examination table. The following explanations can help you understand what will happen during the exam.  Electrodes. The neurologist or a technician places surface electrodes at various locations on your skin depending on where you're  experiencing symptoms. Or the neurologist may insert needle electrodes at different sites depending on your symptoms.  Sensations. The electrodes will at times transmit a tiny electrical current that you may feel as a twinge or spasm. The needle electrode may cause discomfort or pain that usually ends shortly after the needle is removed. If you are concerned about discomfort or pain, you may want to talk to the neurologist about taking a short break during the exam.  Instructions. During the needle EMG, the neurologist will assess whether there is any spontaneous electrical activity when the muscle is at rest - activity that isn't present in healthy muscle tissue - and the degree of activity when you slightly contract the muscle.  He or she will give you instructions on resting and contracting a muscle at appropriate times. Depending on what muscles and nerves the neurologist is examining, he or she may ask you to change positions during the exam.  After your EMG You may experience some temporary, minor bruising where the needle electrode was inserted into your muscle. This bruising should fade within several days. If it persists, contact your primary care doctor.

## 2022-08-22 DIAGNOSIS — R972 Elevated prostate specific antigen [PSA]: Secondary | ICD-10-CM | POA: Diagnosis not present

## 2022-08-26 ENCOUNTER — Encounter: Payer: Self-pay | Admitting: Neurology

## 2022-08-26 ENCOUNTER — Telehealth: Payer: Self-pay | Admitting: Neurology

## 2022-08-26 ENCOUNTER — Ambulatory Visit: Payer: BC Managed Care – PPO | Admitting: Neurology

## 2022-08-26 DIAGNOSIS — R209 Unspecified disturbances of skin sensation: Secondary | ICD-10-CM | POA: Diagnosis not present

## 2022-08-26 DIAGNOSIS — M79671 Pain in right foot: Secondary | ICD-10-CM

## 2022-08-26 DIAGNOSIS — E538 Deficiency of other specified B group vitamins: Secondary | ICD-10-CM

## 2022-08-26 DIAGNOSIS — R7303 Prediabetes: Secondary | ICD-10-CM

## 2022-08-26 DIAGNOSIS — G629 Polyneuropathy, unspecified: Secondary | ICD-10-CM

## 2022-08-26 DIAGNOSIS — M545 Low back pain, unspecified: Secondary | ICD-10-CM

## 2022-08-26 MED ORDER — PREGABALIN 150 MG PO CAPS: 300.0000 mg | ORAL_CAPSULE | Freq: Two times a day (BID) | ORAL | 3 refills | Status: DC

## 2022-08-26 NOTE — Procedures (Signed)
  [facility]  [place], [address]  [place], [place] [address] Tel: [phone] Fax: [phone] Test Date:  08/26/2022  Patient: Charles [name] DOB: 03-Aug-1960 Physician: Kai Levins, MD  Sex: Male Height: 5\' 8"  Ref Phys: Kai Levins, MD  ID#: [id number]   Technician:    History: This is a 62 year old male with numbness, tingling, and burning in his feet.  NCV & EMG Findings: Extensive electrodiagnostic evaluation of the right lower limb shows: Right sural and superficial peroneal/fibular sensory responses are within normal limits. Right peroneal/fibular (EDB) and tibial (AH) motor responses are within normal limits. Right H reflex latency is within normal limits. There is no evidence of active or chronic motor axon loss changes affecting any of the tested muscles. Motor unit configuration and recruitment pattern is within normal limits.  Impression: This is a normal study. In particular, there is no electrodiagnostic evidence of a right lumbosacral (L3-S1) radiculopathy, large fiber sensorimotor neuropathy, or myopathy.   ___________________________ Kai Levins, MD    Nerve Conduction Studies Motor Nerve Results    Latency Amplitude F-Lat Segment Distance CV Comment  Site (ms) Norm (mV) Norm (ms)  (cm) (m/s) Norm   Right Fibular (EDB) Motor  Ankle 3.7  < 6.0 7.5  > 2.5        Bel fib head 10.7 - 7.0 -  Bel fib head-Ankle 33 47  > 40   Pop fossa 12.4 - 6.8 -  Pop fossa-Bel fib head 8 47 -   Right Tibial (AH) Motor  Ankle 3.6  < 6.0 11.3  > 4.0        Knee 11.9 - 10.2 -  Knee-Ankle 41 49  > 40    Sensory Sites    Neg Peak Lat Amplitude (O-P) Segment Distance Velocity Comment  Site (ms) Norm (V) Norm  (cm) (ms)   Right Superficial Fibular Sensory  14 cm-Ankle 3.3  < 4.6 3  > 3 14 cm-Ankle 14    Right Sural Sensory  Calf-Lat mall 3.6  < 4.6 10  > 3 Calf-Lat mall 14     H-Reflex Results    M-Lat H Lat H Neg Amp H-M Lat  Site (ms) (ms) Norm (mV)  (ms)  Right Tibial H-Reflex  Pop fossa 6.5 31.4  < 35.0 4.6 24.9   Electromyography   Side Muscle Ins.Act Fibs Fasc Recrt Amp Dur Poly Activation Comment  Right Tib ant Nml Nml Nml Nml Nml Nml Nml Nml N/A  Right Gastroc MH Nml Nml Nml Nml Nml Nml Nml Nml N/A  Right Rectus fem Nml Nml Nml Nml Nml Nml Nml Nml N/A  Right Biceps fem SH Nml Nml Nml Nml Nml Nml Nml Nml N/A  Right Gluteus med Nml Nml Nml Nml Nml Nml Nml Nml N/A      Waveforms:  Motor      Sensory      H-Reflex

## 2022-08-26 NOTE — Telephone Encounter (Signed)
Discussed the results of patient's EMG after the procedure today. EMG today was normal without evidence of right lumbosacral radiculopathy or large fiber neuropathy. Patient's symptoms are likely due to small fiber neuropathy. We discussed skin biopsy, but given it would not likely change management, he agreed to defer for now.  In terms of treatment, patient did not do well with Cymbalta or Nortriptyline as we had to wean his Prozac which controls his anxiety better. He continues to have pain on Lyrica 200 mg BID. I will increase this to 300 mg BID today. A future option could include oxcarbazepine as an additional therapy.  All questions were answered.  Kai Levins, MD [facility]

## 2022-08-27 ENCOUNTER — Other Ambulatory Visit: Payer: Self-pay | Admitting: Neurology

## 2022-08-27 ENCOUNTER — Telehealth: Payer: Self-pay

## 2022-08-27 DIAGNOSIS — G629 Polyneuropathy, unspecified: Secondary | ICD-10-CM

## 2022-08-27 MED ORDER — PREGABALIN 300 MG PO CAPS: 300.0000 mg | ORAL_CAPSULE | Freq: Two times a day (BID) | ORAL | 3 refills | Status: DC

## 2022-08-29 NOTE — Telephone Encounter (Signed)
done

## 2022-09-01 ENCOUNTER — Encounter: Payer: Self-pay | Admitting: Family Medicine

## 2022-09-02 ENCOUNTER — Encounter: Payer: Self-pay | Admitting: Family Medicine

## 2022-09-02 ENCOUNTER — Other Ambulatory Visit: Payer: Self-pay

## 2022-09-02 ENCOUNTER — Ambulatory Visit (HOSPITAL_BASED_OUTPATIENT_CLINIC_OR_DEPARTMENT_OTHER)
Admission: RE | Admit: 2022-09-02 | Discharge: 2022-09-02 | Disposition: A | Discharge status: Home / Self Care | Payer: BC Managed Care – PPO | Source: Ambulatory Visit | Attending: Family Medicine | Admitting: Family Medicine

## 2022-09-02 ENCOUNTER — Ambulatory Visit: Payer: BC Managed Care – PPO | Admitting: Family Medicine

## 2022-09-02 VITALS — BP 130/82 | Ht 68.0 in | Wt 268.0 lb

## 2022-09-02 DIAGNOSIS — M25561 Pain in right knee: Secondary | ICD-10-CM | POA: Diagnosis not present

## 2022-09-02 DIAGNOSIS — M170 Bilateral primary osteoarthritis of knee: Secondary | ICD-10-CM | POA: Diagnosis not present

## 2022-09-02 MED ORDER — TRIAMCINOLONE ACETONIDE 40 MG/ML IJ SUSP
40.0000 mg | Freq: Once | INTRAMUSCULAR | Status: AC
Start: 2022-09-02 — End: 2022-09-02
  Administered 2022-09-02: 40 mg via INTRA_ARTICULAR

## 2022-09-02 NOTE — Assessment & Plan Note (Signed)
Acute on chronic in nature for the left knee and acute of the right knee.  Does have degenerative changes appreciated more significantly on the left with associated effusion. -Counseled on home exercise therapy and supportive care. -Bilateral knee injection today. -Could consider physical therapy or further imaging

## 2022-09-02 NOTE — Progress Notes (Signed)
Charles [name] - 62 y.o. male MRN [id number]  Date of birth: March 27, 1961  SUBJECTIVE:  Including CC & ROS.  No chief complaint on file.   Charles [name] is a 62 y.o. male that is presenting with acute on chronic left knee pain and acute right knee pain.  The pain is over the medial compartment.  No injury inciting event.  The pain is worse with walking..    Review of Systems See HPI   HISTORY: Past Medical, Surgical, Social, and Family History Reviewed & Updated per EMR.   Pertinent Historical Findings include:  Past Medical History:  Diagnosis Date   BPH (benign prostatic hypertrophy) with urinary retention    CAD (coronary artery disease) 04/2008   successful PCI of the lesion in the proximal LAD using xience drug-eluting stent with improvement in central narrowing from 95% to 0%.  Dr. Juanda Chance   Foley catheter in place    Gout    per pt stable is of 08-18-2014   History of acute bronchitis    recently 07-16-2014   History of sepsis    urosepsis--  08-13-2014 admission   Hyperlipidemia LDL goal <100    Hypertension    Multilevel degenerative disc disease    cervical and lumbar   OSA (obstructive sleep apnea)        Psoriasis    S/P drug eluting coronary stent placement    05-13-2008   DES X1 TO pLAD   Sigmoid diverticulosis     Past Surgical History:  Procedure Laterality Date   BUNIONECTOMY Right 2000   COLONOSCOPY WITH PROPOFOL  last one 10-06-2013   CORONARY ANGIOPLASTY WITH STENT PLACEMENT  05-13-2008    Dr Charlies Constable   PCI w/  DES x1 to  pLAD/  ef 65%   GREEN LIGHT LASER TURP (TRANSURETHRAL RESECTION OF PROSTATE N/A 08/23/2014   Procedure: GREEN LIGHT LASER TURP (TRANSURETHRAL RESECTION OF PROSTATE;  Surgeon: Bjorn Pippin, MD;  Location: [facility];  Service: Urology;  Laterality: N/A;   WRIST GANGLION EXCISION Left 2001     PHYSICAL EXAM:  VS: BP 130/82   Ht 5\' 8"  (1.727 m)   Wt 268 lb (121.6 kg)   BMI 40.75 kg/m  Physical Exam Gen:  NAD, alert, cooperative with exam, well-appearing MSK:  Neurovascularly intact     Aspiration/Injection Procedure Note Charles [name] 1960-06-19  Procedure: Injection Indications: Right knee pain  Procedure Details Consent: Risks of procedure as well as the alternatives and risks of each were explained to the (patient/caregiver).  Consent for procedure obtained. Time Out: Verified patient identification, verified procedure, site/side was marked, verified correct patient position, special equipment/implants available, medications/allergies/relevent history reviewed, required imaging and test results available.  Performed.  The area was cleaned with iodine and alcohol swabs.    The right knee superior lateral suprapatellar pouch was injected using 4 cc of 1% lidocaine and 0.4 cc of 8.4% sodium bicarbonate on a 22-gauge 1-1/2 inch needle.  The syringe was switched to mixture containing 1 cc's of 40 mg Kenalog and 4 cc's of 0.25% bupivacaine was injected.  Ultrasound was used. Images were obtained in long views showing the injection.     A sterile dressing was applied.  Patient did tolerate procedure well.   Aspiration/Injection Procedure Note Charles [name] [birth year]  Procedure: Injection Indications: Left knee pain  Procedure Details Consent: Risks of procedure as well as the alternatives and risks of each were explained to the (patient/caregiver).  Consent for procedure obtained. Time  Out: Verified patient identification, verified procedure, site/side was marked, verified correct patient position, special equipment/implants available, medications/allergies/relevent history reviewed, required imaging and test results available.  Performed.  The area was cleaned with iodine and alcohol swabs.    The left knee superior lateral suprapatellar pouch was injected using 4 cc of 1% lidocaine and 0.4 cc of 8.4% of sodium bicarbonate on a 22-gauge 1-1/2 inch needle.  The syringe was switched  to mixture containing 1 cc's of 40 mg Kenalog and 4 cc's of 0.25% bupivacaine was injected.  Ultrasound was used. Images were obtained in long views showing the injection.     A sterile dressing was applied.  Patient did tolerate procedure well.     ASSESSMENT & PLAN:   OA (osteoarthritis) of knee Acute on chronic in nature for the left knee and acute of the right knee.  Does have degenerative changes appreciated more significantly on the left with associated effusion. -Counseled on home exercise therapy and supportive care. -Bilateral knee injection today. -Could consider physical therapy or further imaging

## 2022-09-02 NOTE — Patient Instructions (Signed)
Good to see you Please use ice as needed  Please continue the exercises  We'll call with the xray results.   Please send me a message in MyChart with any questions or updates.  Please see me back in 4 weeks.   --Dr. Jordan Likes

## 2022-09-03 ENCOUNTER — Telehealth: Payer: Self-pay | Admitting: Family Medicine

## 2022-09-03 DIAGNOSIS — R339 Retention of urine, unspecified: Secondary | ICD-10-CM | POA: Diagnosis not present

## 2022-09-03 DIAGNOSIS — R972 Elevated prostate specific antigen [PSA]: Secondary | ICD-10-CM | POA: Diagnosis not present

## 2022-09-03 DIAGNOSIS — N5201 Erectile dysfunction due to arterial insufficiency: Secondary | ICD-10-CM | POA: Diagnosis not present

## 2022-09-03 DIAGNOSIS — R310 Gross hematuria: Secondary | ICD-10-CM | POA: Diagnosis not present

## 2022-09-03 NOTE — Telephone Encounter (Signed)
Left VM for patient. If he calls back please have him speak with a nurse/CMA and inform that his xrays show arthritis on the inside of his knee.   If any questions then please take the best time and phone number to call and I will try to call him back.   Myra Rude, MD [facility] 09/03/2022, 10:51 AM

## 2022-09-04 NOTE — Telephone Encounter (Signed)
Pt informed of below.  

## 2022-09-09 ENCOUNTER — Encounter: Payer: Self-pay | Admitting: *Deleted

## 2022-09-11 ENCOUNTER — Ambulatory Visit: Payer: BC Managed Care – PPO | Admitting: Orthopedic Surgery

## 2022-09-24 ENCOUNTER — Encounter: Payer: BC Managed Care – PPO | Admitting: Neurology

## 2022-09-30 ENCOUNTER — Ambulatory Visit: Payer: BC Managed Care – PPO | Admitting: Family Medicine

## 2022-10-06 ENCOUNTER — Encounter: Payer: Self-pay | Admitting: Neurology

## 2022-10-12 NOTE — Progress Notes (Unsigned)
[facility] [address], [address] [place], [place] [address] [phone]  Date:  10/14/2022   Name:  Charles [name]   DOB:  1960-08-09   MRN:  [id number]  PCP:  Pearline Cables, MD    Chief Complaint: No chief complaint on file.   History of Present Illness:  Charles [name] is a 62 y.o. very pleasant male patient who presents with the following:  Patient seen today for physical exam Most recent visit with myself January 2023, although we have exchanged many MyChart messages since that time History of hypertension, CAD, prediabetes, plaque psoriasis He works in Public affairs consultant; physically demanding job Labs need to be updated    Patient Active Problem List   Diagnosis Date Noted   OA (osteoarthritis) of knee 09/19/2021   Pre-diabetes 06/28/2019   Sepsis secondary to UTI (HCC) 11/17/2017   UTI (urinary tract infection) 11/17/2017   Left calcaneal bursitis 12/14/2014   Pronation deformity of ankle, acquired 10/26/2014   Allergic reaction 10/14/2014   Achilles tendinosis 10/05/2014   OSA (obstructive sleep apnea) 08/18/2014   Gout 08/14/2014   Obesity 06/02/2013   Abnormal MRI, spine 05/09/2013   H/O urinary retention 05/09/2013   Psoriasis 05/08/2012   Coronary artery disease 08/03/2008   Hyperlipidemia 11/10/2007   History of gout 11/10/2007   Essential hypertension 11/10/2007   G E R D 01/06/2007    Past Medical History:  Diagnosis Date   BPH (benign prostatic hypertrophy) with urinary retention    CAD (coronary artery disease) 04/2008   successful PCI of the lesion in the proximal LAD using xience drug-eluting stent with improvement in central narrowing from 95% to 0%.  Dr. Juanda Chance   Foley catheter in place    Gout    per pt stable is of 08-18-2014   History of acute bronchitis    recently 07-16-2014   History of sepsis    urosepsis--  08-13-2014 admission   Hyperlipidemia  LDL goal <100    Hypertension    Multilevel degenerative disc disease    cervical and lumbar   OSA (obstructive sleep apnea)        Psoriasis    S/P drug eluting coronary stent placement    05-13-2008   DES X1 TO pLAD   Sigmoid diverticulosis     Past Surgical History:  Procedure Laterality Date   BUNIONECTOMY Right 2000   COLONOSCOPY WITH PROPOFOL  last one 10-06-2013   CORONARY ANGIOPLASTY WITH STENT PLACEMENT  05-13-2008    Dr Charlies Constable   PCI w/  DES x1 to  pLAD/  ef 65%   GREEN LIGHT LASER TURP (TRANSURETHRAL RESECTION OF PROSTATE N/A 08/23/2014   Procedure: GREEN LIGHT LASER TURP (TRANSURETHRAL RESECTION OF PROSTATE;  Surgeon: Bjorn Pippin, MD;  Location: [facility];  Service: Urology;  Laterality: N/A;   WRIST GANGLION EXCISION Left 2001    Social History   Tobacco Use   Smoking status: Former    Packs/day: 0.75    Years: 14.00    Additional pack years: 0.00    Total pack years: 10.50    Types: Cigarettes    Quit date: 05/27/1997    Years since quitting: 25.3   Smokeless tobacco: Former    Types: Snuff    Quit date: 08/17/1997  Vaping Use   Vaping Use: Never used  Substance Use Topics   Alcohol use: Yes    Comment: occasional  Drug use: No    Family History  Problem Relation Age of Onset   Hypertension Father    Stroke Father 23   Lung cancer Mother         smoker   Aneurysm Mother    Diabetes Sister    Heart disease Brother 34        stent LAD 2012   Colon cancer Neg Hx    Pancreatic cancer Neg Hx    Rectal cancer Neg Hx    Stomach cancer Neg Hx     Allergies  Allergen Reactions   Lidocaine Anaphylaxis    REACTION: anaphylactic shock (also he was on Neomycin sulfate)   Neomycin Sulfate Anaphylaxis    REACTION: anaphylactic shock (also on Lidocaine  topically)   Sulfa Antibiotics Anaphylaxis   Sulfa Drugs Cross Reactors Anaphylaxis   Hctz [Hydrochlorothiazide] Other (See Comments)    Gout    Tramadol Other (See Comments)     05/26/13 urinary retention   Atrovent [Ipratropium] Other (See Comments)    Caused Urinary retention (this is the nasal spray)   Latex Itching and Rash    Skin irritation    Medication list has been reviewed and updated.  Current Outpatient Medications on File Prior to Visit  Medication Sig Dispense Refill   allopurinol (ZYLOPRIM) 300 MG tablet TAKE 1 TABLET BY MOUTH DAILY 90 tablet 3   aspirin 81 MG tablet Take 1 tablet (81 mg total) by mouth daily. 90 tablet 1   cetirizine (ZYRTEC) 10 MG tablet Take 10 mg by mouth at bedtime.     clonazePAM (KLONOPIN) 0.5 MG tablet Take 1 tablet (0.5 mg total) by mouth 2 (two) times daily as needed for anxiety. 20 tablet 1   Coenzyme Q10 (CO Q 10 PO) Take 1 capsule by mouth every morning. 500mg      finasteride (PROSCAR) 5 MG tablet Take 1 tablet (5 mg total) by mouth daily. 30 tablet 0   fluocinonide (LIDEX) 0.05 % external solution Apply 1 application topically 2 (two) times daily. As needed for psoriasis.  300 ml is a 90 day supply 300 mL 3   fluocinonide cream (LIDEX) 0.05 % Apply 1 application topically 2 (two) times daily. As needed for psoriasis 60 g 2   fluticasone (FLONASE) 50 MCG/ACT nasal spray Place 2 sprays into both nostrils daily. 16 g 1   metFORMIN (GLUCOPHAGE) 500 MG tablet Take 1 tablet (500 mg total) by mouth daily. 90 tablet 3   metoprolol succinate (TOPROL-XL) 100 MG 24 hr tablet TAKE 1 TABLET BY MOUTH  DAILY 90 tablet 3   Multiple Vitamin (MULTIVITAMIN) capsule Take 1 capsule by mouth every morning.     nitroGLYCERIN (NITROSTAT) 0.3 MG SL tablet Place 1 tablet (0.3 mg total) under the tongue every 5 (five) minutes as needed for chest pain. 25 tablet 2   Omega-3 Fatty Acids (FISH OIL) 1000 MG CAPS Take 1,000 mg by mouth every morning.     omeprazole (PRILOSEC) 20 MG capsule Take 1 capsule (20 mg total) by mouth 2 (two) times daily before a meal. 180 capsule 3   pregabalin (LYRICA) 300 MG capsule Take 1 capsule (300 mg total) by mouth 2  (two) times daily. 180 capsule 3   rosuvastatin (CRESTOR) 20 MG tablet TAKE 1 TABLET BY MOUTH DAILY 90 tablet 3   tamsulosin (FLOMAX) 0.4 MG CAPS capsule Take 1 capsule (0.4 mg total) by mouth daily after supper. 30 capsule 0   No current facility-administered medications on  file prior to visit.    Review of Systems:  As per HPI- otherwise negative.   Physical Examination: There were no vitals filed for this visit. There were no vitals filed for this visit. There is no height or weight on file to calculate BMI. Ideal Body Weight:    GEN: no acute distress. HEENT: Atraumatic, Normocephalic.  Ears and Nose: No external deformity. CV: RRR, No M/G/R. No JVD. No thrill. No extra heart sounds. PULM: CTA B, no wheezes, crackles, rhonchi. No retractions. No resp. distress. No accessory muscle use. ABD: S, NT, ND, +BS. No rebound. No HSM. EXTR: No c/c/e PSYCH: Normally interactive. Conversant.    Assessment and Plan: *** Physical exam today- Will plan further follow- up pending labs.  Signed Abbe Amsterdam, MD

## 2022-10-14 ENCOUNTER — Encounter: Payer: Self-pay | Admitting: Family Medicine

## 2022-10-14 ENCOUNTER — Ambulatory Visit (INDEPENDENT_AMBULATORY_CARE_PROVIDER_SITE_OTHER): Payer: BC Managed Care – PPO | Admitting: Family Medicine

## 2022-10-14 VITALS — BP 130/80 | Ht 68.0 in | Wt 268.0 lb

## 2022-10-14 VITALS — BP 118/70 | HR 67 | Temp 98.0°F | Resp 18 | Ht 68.0 in | Wt 277.2 lb

## 2022-10-14 DIAGNOSIS — I10 Essential (primary) hypertension: Secondary | ICD-10-CM | POA: Diagnosis not present

## 2022-10-14 DIAGNOSIS — Z125 Encounter for screening for malignant neoplasm of prostate: Secondary | ICD-10-CM

## 2022-10-14 DIAGNOSIS — E782 Mixed hyperlipidemia: Secondary | ICD-10-CM

## 2022-10-14 DIAGNOSIS — Z0000 Encounter for general adult medical examination without abnormal findings: Secondary | ICD-10-CM

## 2022-10-14 DIAGNOSIS — M170 Bilateral primary osteoarthritis of knee: Secondary | ICD-10-CM | POA: Diagnosis not present

## 2022-10-14 DIAGNOSIS — I2510 Atherosclerotic heart disease of native coronary artery without angina pectoris: Secondary | ICD-10-CM

## 2022-10-14 DIAGNOSIS — E119 Type 2 diabetes mellitus without complications: Secondary | ICD-10-CM

## 2022-10-14 DIAGNOSIS — Z7984 Long term (current) use of oral hypoglycemic drugs: Secondary | ICD-10-CM

## 2022-10-14 DIAGNOSIS — R7303 Prediabetes: Secondary | ICD-10-CM

## 2022-10-14 MED ORDER — METOPROLOL SUCCINATE ER 100 MG PO TB24: 100.0000 mg | ORAL_TABLET | Freq: Every day | ORAL | 3 refills | Status: DC

## 2022-10-14 NOTE — Patient Instructions (Signed)
Good to see you We will run the benefits for the gel injections  Please send me a message in MyChart with any questions or updates.  Please see [place] back to have the gel injections.   --Dr. Jordan Likes

## 2022-10-14 NOTE — Progress Notes (Signed)
  Charles [name] - 62 y.o. male MRN [id number]  Date of birth: 02/10/1961  SUBJECTIVE:  Including CC & ROS.  No chief complaint on file.   Charles [name] is a 62 y.o. male that is following up for his bilateral knee pain.  Continues to have pain after steroid injections.  He is on his feet on a regular basis.  Pain is over the medial aspect.  Continues to use the hinged braces.    Review of Systems See HPI   HISTORY: Past Medical, Surgical, Social, and Family History Reviewed & Updated per EMR.   Pertinent Historical Findings include:  Past Medical History:  Diagnosis Date   BPH (benign prostatic hypertrophy) with urinary retention    CAD (coronary artery disease) 04/2008   successful PCI of the lesion in the proximal LAD using xience drug-eluting stent with improvement in central narrowing from 95% to 0%.  Dr. Juanda Chance   Foley catheter in place    Gout    per pt stable is of 08-18-2014   History of acute bronchitis    recently 07-16-2014   History of sepsis    urosepsis--  08-13-2014 admission   Hyperlipidemia LDL goal <100    Hypertension    Multilevel degenerative disc disease    cervical and lumbar   OSA (obstructive sleep apnea)        Psoriasis    S/P drug eluting coronary stent placement    05-13-2008   DES X1 TO pLAD   Sigmoid diverticulosis     Past Surgical History:  Procedure Laterality Date   BUNIONECTOMY Right 2000   COLONOSCOPY WITH PROPOFOL  last one 10-06-2013   CORONARY ANGIOPLASTY WITH STENT PLACEMENT  05-13-2008    Dr Charlies Constable   PCI w/  DES x1 to  pLAD/  ef 65%   GREEN LIGHT LASER TURP (TRANSURETHRAL RESECTION OF PROSTATE N/A 08/23/2014   Procedure: GREEN LIGHT LASER TURP (TRANSURETHRAL RESECTION OF PROSTATE;  Surgeon: Bjorn Pippin, MD;  Location: [facility];  Service: Urology;  Laterality: N/A;   WRIST GANGLION EXCISION Left 2001     PHYSICAL EXAM:  VS: BP 130/80 (BP Location: Left Arm, Patient Position: Sitting)   Ht 5\' 8"   (1.727 m)   Wt 268 lb (121.6 kg)   BMI 40.75 kg/m  Physical Exam Gen: NAD, alert, cooperative with exam, well-appearing MSK:  Neurovascularly intact       ASSESSMENT & PLAN:   OA (osteoarthritis) of knee Acute on chronic in nature.  Continues to have pain after recent steroid injections bilaterally.  Has known degenerative changes on imaging. -Counseled on home exercise therapy and supportive care. - Pursue gel injections bilaterally. - Could consider genicular nerve ablation or PRP

## 2022-10-14 NOTE — Patient Instructions (Addendum)
Good to see you again today- I will be in touch with your labs asap Let me see what your A1c is- if you do now have diabetes we might be able to get a GLP-1 drug for weight loss Also- weight loss surgery may be a good option for you to look at  The first step is to sign up for the bariatric surgery seminar through [facility] (to find out the basics) [email or web address]  Let me know when you need any refills

## 2022-10-14 NOTE — Assessment & Plan Note (Signed)
Acute on chronic in nature.  Continues to have pain after recent steroid injections bilaterally.  Has known degenerative changes on imaging. -Counseled on home exercise therapy and supportive care. - Pursue gel injections bilaterally. - Could consider genicular nerve ablation or PRP

## 2022-10-15 ENCOUNTER — Encounter: Payer: Self-pay | Admitting: Family Medicine

## 2022-10-15 DIAGNOSIS — I2510 Atherosclerotic heart disease of native coronary artery without angina pectoris: Secondary | ICD-10-CM

## 2022-10-15 DIAGNOSIS — E119 Type 2 diabetes mellitus without complications: Secondary | ICD-10-CM

## 2022-10-15 DIAGNOSIS — E782 Mixed hyperlipidemia: Secondary | ICD-10-CM

## 2022-10-15 LAB — COMPREHENSIVE METABOLIC PANEL
ALT: 29 U/L (ref 0–53)
AST: 24 U/L (ref 0–37)
Albumin: 4.3 g/dL (ref 3.5–5.2)
Alkaline Phosphatase: 42 U/L (ref 39–117)
BUN: 14 mg/dL (ref 6–23)
CO2: 27 mEq/L (ref 19–32)
Calcium: 9.4 mg/dL (ref 8.4–10.5)
Chloride: 101 mEq/L (ref 96–112)
Creatinine, Ser: 0.99 mg/dL (ref 0.40–1.50)
GFR: 80.89 mL/min (ref >60.00)
Glucose, Bld: 114 mg/dL — ABNORMAL HIGH (ref 70–99)
Potassium: 4.6 mEq/L (ref 3.5–5.1)
Sodium: 140 mEq/L (ref 135–145)
Total Bilirubin: 0.6 mg/dL (ref 0.2–1.2)
Total Protein: 6.6 g/dL (ref 6.0–8.3)

## 2022-10-15 LAB — CBC
HCT: 45 % (ref 39.0–52.0)
Hemoglobin: 14.8 g/dL (ref 13.0–17.0)
MCHC: 32.9 g/dL (ref 30.0–36.0)
MCV: 91.5 fl (ref 78.0–100.0)
Platelets: 215 10*3/uL (ref 150.0–400.0)
RBC: 4.91 Mil/uL (ref 4.22–5.81)
RDW: 14.1 % (ref 11.5–15.5)
WBC: 6.7 10*3/uL (ref 4.0–10.5)

## 2022-10-15 LAB — LDL CHOLESTEROL, DIRECT: Direct LDL: 62 mg/dL

## 2022-10-15 LAB — PSA: PSA: 0.77 ng/mL (ref 0.10–4.00)

## 2022-10-15 LAB — HEMOGLOBIN A1C: Hgb A1c MFr Bld: 6.6 % — ABNORMAL HIGH (ref 4.6–6.5)

## 2022-10-15 LAB — LIPID PANEL
Cholesterol: 141 mg/dL (ref 0–200)
HDL: 32.3 mg/dL — ABNORMAL LOW (ref >39.00)
Total CHOL/HDL Ratio: 4
Triglycerides: 437 mg/dL — ABNORMAL HIGH (ref 0.0–149.0)

## 2022-10-15 MED ORDER — TIRZEPATIDE 2.5 MG/0.5ML ~~LOC~~ SOAJ
2.5000 mg | SUBCUTANEOUS | 0 refills | Status: DC
Start: 2022-10-15 — End: 2022-11-07

## 2022-10-16 NOTE — Progress Notes (Signed)
@Patient  ID: Charles [name], male    DOB: November 20, 1960, 62 y.o.   MRN: [id number]  Chief Complaint  Patient presents with   Follow-up    Referring provider: Veryl Speak, FNP  HPI:  Male former smoker followed for severe sleep apnea on C Pap at bedtime HST 02/07/15 >> AHI 53.2, SaO2 low 75%.  ================================================================================  08/02/2016 - [place], NPFollow up : OSA  Patient returns for follow-up for sleep apnea. Patient was found to have severe sleep apnea on home sleep study in September 2016. He was started on C Pap at bedtime. Patient unfortunately did not return for follow-up. Patient says he is doing well on C Pap. Wears CPAP each night for about 8 hours. He feels rested. No sign daytime sleepiness. Feels more alert.  Download shows excellent compliance , AHI 2.5. avg usage 9 hr . Min leaks.  Travels a lot , bought separate Air Mini CPAP to take with him.   10/17/22- 55 yoM former smoker re-establishing for OSA. Medical complications include HTN, CAD, GERD, DM2,, Psoriasis, BPH,   CPAP auto 5-15/ [facility]   02/22/15            Has AirMini travel CPAP Download compliance-81%, AHI 1.3/ hr Body weight today- ----Needs new cpap machine was  with [facility]. "Motor life exceeded". He has benefited from CPAP with good compliance.  Download reviewed. Medical purpose and usage goals reviewed. He denies recent cardiac issues.  ROS-see HPI   + = positive Constitutional:    weight loss, night sweats, fevers, chills, fatigue, lassitude. HEENT:    headaches, difficulty swallowing, tooth/dental problems, sore throat,       sneezing, itching, ear ache, nasal congestion, post nasal drip, snoring CV:    chest pain, orthopnea, PND, swelling in lower extremities, anasarca,                                   dizziness, palpitations Resp:   shortness of breath with exertion or at rest.                productive cough,   non-productive cough, coughing up  of blood.              change in color of mucus.  wheezing.   Skin:    rash or lesions. GI:  No-   heartburn, indigestion, abdominal pain, nausea, vomiting, diarrhea,                 change in bowel habits, loss of appetite GU: dysuria, change in color of urine, no urgency or frequency.   flank pain. MS:   joint pain, stiffness, decreased range of motion, back pain. Neuro-     nothing unusual Psych:  change in mood or affect.  depression or anxiety.   memory loss.  OBJ- Physical Exam General- Alert, Oriented, Affect-appropriate, Distress- none acute, +obese Skin- rash-none, lesions- none, excoriation- none Lymphadenopathy- none Head- atraumatic            Eyes- Gross vision intact, PERRLA, conjunctivae and secretions clear            Ears- Hearing, canals-normal            Nose- Clear, no-Septal dev, mucus, polyps, erosion, perforation             Throat- Mallampati IV , mucosa clear , drainage- none, tonsils- atrophic Neck- flexible , trachea midline, no stridor ,  thyroid nl, carotid no bruit Chest - symmetrical excursion , unlabored           Heart/CV- RRR , no murmur , no gallop  , no rub, nl s1 s2                           - JVD- none , edema- none, stasis changes- none, varices- none           Lung- clear to P&A, wheeze- none, cough- none , dullness-none, rub- none           Chest wall-  Abd-  Br/ Gen/ Rectal- Not done, not indicated Extrem- cyanosis- none, clubbing, none, atrophy- none, strength- nl Neuro- grossly intact to observation

## 2022-10-17 ENCOUNTER — Ambulatory Visit: Payer: BC Managed Care – PPO | Admitting: Internal Medicine

## 2022-10-17 ENCOUNTER — Encounter: Payer: Self-pay | Admitting: Internal Medicine

## 2022-10-17 VITALS — BP 128/76 | HR 79 | Ht 68.0 in | Wt 275.8 lb

## 2022-10-17 DIAGNOSIS — I2510 Atherosclerotic heart disease of native coronary artery without angina pectoris: Secondary | ICD-10-CM | POA: Diagnosis not present

## 2022-10-17 DIAGNOSIS — G4733 Obstructive sleep apnea (adult) (pediatric): Secondary | ICD-10-CM | POA: Diagnosis not present

## 2022-10-17 NOTE — Patient Instructions (Signed)
Order- DME [facility]- replace old CPAP machine auto 5-15, mask of choice, humidifier, supplies, AirView/ card  Please call if we can help

## 2022-10-23 ENCOUNTER — Telehealth: Payer: Self-pay | Admitting: *Deleted

## 2022-10-23 NOTE — Telephone Encounter (Signed)
Rec'd pt's summary of benefits for bilateral Monovisc injections. Per his plan, deductible does not apply. Once his $6000 deductible is met, coverage goes to 100%. Only 1 copay applies per date of service. PA is req'd. I completed and faxed PA form to [employer].

## 2022-10-24 ENCOUNTER — Encounter: Payer: Self-pay | Admitting: Family Medicine

## 2022-10-24 ENCOUNTER — Encounter (INDEPENDENT_AMBULATORY_CARE_PROVIDER_SITE_OTHER): Payer: BC Managed Care – PPO | Admitting: Family Medicine

## 2022-10-24 DIAGNOSIS — M5442 Lumbago with sciatica, left side: Secondary | ICD-10-CM

## 2022-10-24 DIAGNOSIS — I2510 Atherosclerotic heart disease of native coronary artery without angina pectoris: Secondary | ICD-10-CM

## 2022-10-24 DIAGNOSIS — E119 Type 2 diabetes mellitus without complications: Secondary | ICD-10-CM

## 2022-10-24 MED ORDER — NITROGLYCERIN 0.3 MG SL SUBL
0.3000 mg | SUBLINGUAL_TABLET | SUBLINGUAL | 2 refills | Status: DC | PRN
Start: 2022-10-24 — End: 2024-04-14

## 2022-10-24 MED ORDER — PREDNISONE 20 MG PO TABS
ORAL_TABLET | ORAL | 0 refills | Status: DC
Start: 2022-10-24 — End: 2023-01-07

## 2022-10-24 NOTE — Telephone Encounter (Signed)
Charles [name] from [employer] (pt's Ins [employer]) says they did NOT approve Monovisc--records don't show he tried Synvisc 1,2,or Euflexxa they are sending denial letter.

## 2022-10-24 NOTE — Telephone Encounter (Signed)
Please see the MyChart message reply(ies) for my assessment and plan.  The patient gave consent for this Medical Advice Message and is aware that it may result in a bill to their insurance company as well as the possibility that this may result in a co-payment or deductible. They are an established patient, but are not seeking medical advice exclusively about a problem treated during an in person or video visit in the last 7 days. I did not recommend an in person or video visit within 7 days of my reply.  I spent a total of 10 minutes cumulative time within 7 days through MyChart messaging Amaryllis Malmquist, MD  

## 2022-10-31 DIAGNOSIS — G4733 Obstructive sleep apnea (adult) (pediatric): Secondary | ICD-10-CM | POA: Diagnosis not present

## 2022-11-07 MED ORDER — TIRZEPATIDE 5 MG/0.5ML ~~LOC~~ SOAJ: 5.0000 mg | SUBCUTANEOUS | 1 refills | Status: DC

## 2022-11-07 NOTE — Telephone Encounter (Signed)
Left detailed message informing pt of below and to see if he has ever tried either of the below preferred gel injections before. Also to advise I have Alma Friendly., office manager checking to see if we can verify benefits and order Euflexxa or Synvisc/Synovisc One for pt.

## 2022-11-08 NOTE — Telephone Encounter (Signed)
Charles [name], CMA Patient returned your call says he had the Synvisc in July 2024 (3 shot series) & has never used Euflexxa.  Advised pt we would VOB & check for PAC than order. Advised him we would contact once all processes completed to schedule for appt.

## 2022-11-30 DIAGNOSIS — G4733 Obstructive sleep apnea (adult) (pediatric): Secondary | ICD-10-CM | POA: Diagnosis not present

## 2022-12-02 ENCOUNTER — Encounter: Payer: Self-pay | Admitting: Neurology

## 2022-12-02 NOTE — Telephone Encounter (Signed)
Please advise when you are able to. Pt is aware that you are on Vacation.  

## 2022-12-05 NOTE — Telephone Encounter (Signed)
Left message for patient to call back. I need patient to call his insurance and verify his OOP for Euflexxa (3 shot series)  before we order medication and schedule him for OV in [place] [facility].   I need to provide him with drug code [id number], CPT 20610 and his dx code is M17.0 for bilateral knee OA.

## 2022-12-08 ENCOUNTER — Encounter: Payer: Self-pay | Admitting: Internal Medicine

## 2022-12-08 NOTE — Assessment & Plan Note (Signed)
He has benefited from CPAP and has been compliant.  Machine needs replacement-motor life exceeded Plan-replace old CPAP machine auto 5-15

## 2022-12-08 NOTE — Assessment & Plan Note (Signed)
Followed by Cardiology with no new issues reported.

## 2022-12-09 ENCOUNTER — Telehealth: Payer: Self-pay | Admitting: Neurology

## 2022-12-09 NOTE — Telephone Encounter (Signed)
I called and LMOM for patient to call back to schedule Skin Biopsy  with Dr Loleta Chance. I also left a message for him on 12-05-22

## 2022-12-10 NOTE — Telephone Encounter (Signed)
[  10:03 AM] Valda Favia Mr. Carlyon just cld back, I read him your note. Pt says will contact his Ins Co re: Euflexxa VOB & cb with response.

## 2022-12-19 NOTE — Telephone Encounter (Signed)
Patient cld his Ins Co /[employer]  to verify benefits as directed, per patient: Euflexxa is covered @ 100%.  He owes $50 OV copay.  Injectiable doesn't have to go through Specialty Pharm[facility] but can be [employer].

## 2022-12-20 NOTE — Telephone Encounter (Signed)
Cld [employer] spk w/ Gerome Apley. confirmed no  Prior Auth required for Euflexxa J code [id number]--call ref# [id number].

## 2022-12-21 ENCOUNTER — Encounter: Payer: Self-pay | Admitting: Family Medicine

## 2022-12-22 ENCOUNTER — Other Ambulatory Visit: Payer: Self-pay | Admitting: Family Medicine

## 2022-12-30 ENCOUNTER — Ambulatory Visit: Payer: BC Managed Care – PPO | Admitting: Family Medicine

## 2022-12-31 DIAGNOSIS — G4733 Obstructive sleep apnea (adult) (pediatric): Secondary | ICD-10-CM | POA: Diagnosis not present

## 2023-01-01 MED ORDER — TIRZEPATIDE 7.5 MG/0.5ML ~~LOC~~ SOAJ: 7.5000 mg | SUBCUTANEOUS | 2 refills | Status: DC

## 2023-01-01 NOTE — Addendum Note (Signed)
Addended by: Abbe Amsterdam C on: 01/01/2023 09:23 PM   Modules accepted: Orders

## 2023-01-07 ENCOUNTER — Ambulatory Visit (INDEPENDENT_AMBULATORY_CARE_PROVIDER_SITE_OTHER): Payer: BC Managed Care – PPO | Admitting: Neurology

## 2023-01-07 DIAGNOSIS — R202 Paresthesia of skin: Secondary | ICD-10-CM | POA: Diagnosis not present

## 2023-01-07 DIAGNOSIS — R209 Unspecified disturbances of skin sensation: Secondary | ICD-10-CM | POA: Diagnosis not present

## 2023-01-07 DIAGNOSIS — G629 Polyneuropathy, unspecified: Secondary | ICD-10-CM | POA: Diagnosis not present

## 2023-01-07 NOTE — Progress Notes (Signed)
Punch Biopsy Procedure Note  Preprocedure Diagnosis: disturbance of skin sensation   Postprocedure Diagnosis: same  Locations: Site 1: right lateral distal leg;  Site 2: right lateral thigh  Indications: r/o small fiber neuropathy  Anesthesia: 3 mL Lidocaine 1% with epinephrine  Patient has a listed allergy to lidocaine. He states he had local lidocaine for knee injection and did not have a reaction. He believes the reaction was to neomycin. We discussed risks of local lidocaine, and patient wished to proceed with lidocaine for local numbing. He agreed to be monitored for 15 minutes after procedure and is with wife and will monitor closely for any reaction. Patient did not think he had a true lidocaine allergy though.  Procedure Details Patient informed of the risks (including but not limited to bleeding, pain, infection, scar and infection) and benefits of the procedure.  Informed consent obtained.  The areas which were chosen for biopsy, as above, and surrounding areas were given a sterile prep using alcohol and iodine. The skin was then stretched perpendicular to the skin tension lines and sample removed using the 3 mm punch. Pressure applied, hemostasis achieved.   Dressing applied. The specimen(s) was sent for pathologic examination. The patient tolerated the procedure well.  Estimated Blood Loss: 1 ml  Condition: Stable  Complications: none.  Plan: 1. Instructed to keep the wound dry and covered for 24h and clean thereafter. 2. Warning signs of infection were reviewed.  3. Patient was monitored for > 15 minutes after procedure with no side effects from local lidocaine.   Jacquelyne Balint, MD [facility]

## 2023-01-13 ENCOUNTER — Encounter: Payer: Self-pay | Admitting: Family Medicine

## 2023-01-13 ENCOUNTER — Ambulatory Visit: Payer: BC Managed Care – PPO | Admitting: Family Medicine

## 2023-01-13 VITALS — BP 138/88 | Ht 68.0 in | Wt 252.0 lb

## 2023-01-13 DIAGNOSIS — M170 Bilateral primary osteoarthritis of knee: Secondary | ICD-10-CM

## 2023-01-13 NOTE — Progress Notes (Signed)
PCP: Copland, Gwenlyn Found, MD  Subjective:   HPI: Patient is a 62 y.o. male here for bilateral knee arthritis.  Patient returns today for bilatera euflexxa injections.  Past Medical History:  Diagnosis Date   BPH (benign prostatic hypertrophy) with urinary retention    CAD (coronary artery disease) 04/2008   successful PCI of the lesion in the proximal LAD using xience drug-eluting stent with improvement in central narrowing from 95% to 0%.  Dr. Juanda Chance   Foley catheter in place    Gout    per pt stable is of 08-18-2014   History of acute bronchitis    recently 07-16-2014   History of sepsis    urosepsis--  08-13-2014 admission   Hyperlipidemia LDL goal <100    Hypertension    Multilevel degenerative disc disease    cervical and lumbar   OSA (obstructive sleep apnea)        Psoriasis    S/P drug eluting coronary stent placement    05-13-2008   DES X1 TO pLAD   Sigmoid diverticulosis     Current Outpatient Medications on File Prior to Visit  Medication Sig Dispense Refill   allopurinol (ZYLOPRIM) 300 MG tablet TAKE 1 TABLET BY MOUTH DAILY 90 tablet 3   aspirin 81 MG tablet Take 1 tablet (81 mg total) by mouth daily. 90 tablet 1   cetirizine (ZYRTEC) 10 MG tablet Take 10 mg by mouth at bedtime.     clonazePAM (KLONOPIN) 0.5 MG tablet Take 1 tablet (0.5 mg total) by mouth 2 (two) times daily as needed for anxiety. 20 tablet 1   Coenzyme Q10 (CO Q 10 PO) Take 1 capsule by mouth every morning. 500mg      finasteride (PROSCAR) 5 MG tablet Take 1 tablet (5 mg total) by mouth daily. 30 tablet 0   fluocinonide (LIDEX) 0.05 % external solution Apply 1 application topically 2 (two) times daily. As needed for psoriasis.  300 ml is a 90 day supply 300 mL 3   fluocinonide cream (LIDEX) 0.05 % Apply 1 application topically 2 (two) times daily. As needed for psoriasis 60 g 2   FLUoxetine (PROZAC) 20 MG capsule Take 1 capsule (20 mg total) by mouth daily. 90 capsule 0   fluticasone (FLONASE)  50 MCG/ACT nasal spray Place 2 sprays into both nostrils daily. 16 g 1   metFORMIN (GLUCOPHAGE) 500 MG tablet TAKE 1 TABLET BY MOUTH DAILY 90 tablet 3   metoprolol succinate (TOPROL-XL) 100 MG 24 hr tablet Take 1 tablet (100 mg total) by mouth daily. Take with or immediately following a meal. 90 tablet 3   Multiple Vitamin (MULTIVITAMIN) capsule Take 1 capsule by mouth every morning.     nitroGLYCERIN (NITROSTAT) 0.3 MG SL tablet Place 1 tablet (0.3 mg total) under the tongue every 5 (five) minutes as needed for chest pain. 25 tablet 2   Omega-3 Fatty Acids (FISH OIL) 1000 MG CAPS Take 1,000 mg by mouth every morning.     omeprazole (PRILOSEC) 20 MG capsule Take 1 capsule (20 mg total) by mouth 2 (two) times daily before a meal. 180 capsule 3   pregabalin (LYRICA) 300 MG capsule Take 1 capsule (300 mg total) by mouth 2 (two) times daily. 180 capsule 3   rosuvastatin (CRESTOR) 20 MG tablet TAKE 1 TABLET BY MOUTH DAILY 90 tablet 3   tamsulosin (FLOMAX) 0.4 MG CAPS capsule Take 1 capsule (0.4 mg total) by mouth daily after supper. 30 capsule 0   tirzepatide [facility])  7.5 MG/0.5ML Pen Inject 7.5 mg into the skin once a week. 2 mL 2   No current facility-administered medications on file prior to visit.    Past Surgical History:  Procedure Laterality Date   BUNIONECTOMY Right 2000   COLONOSCOPY WITH PROPOFOL  last one 10-06-2013   CORONARY ANGIOPLASTY WITH STENT PLACEMENT  05-13-2008    Dr Charlies Constable   PCI w/  DES x1 to  pLAD/  ef 65%   GREEN LIGHT LASER TURP (TRANSURETHRAL RESECTION OF PROSTATE N/A 08/23/2014   Procedure: GREEN LIGHT LASER TURP (TRANSURETHRAL RESECTION OF PROSTATE;  Surgeon: Bjorn Pippin, MD;  Location: [facility];  Service: Urology;  Laterality: N/A;   WRIST GANGLION EXCISION Left 2001    Allergies  Allergen Reactions   Lidocaine Anaphylaxis    REACTION: anaphylactic shock (also he was on Neomycin sulfate)   Neomycin Sulfate Anaphylaxis    REACTION:  anaphylactic shock (also on Lidocaine  topically)   Sulfa Antibiotics Anaphylaxis   Sulfa Drugs Cross Reactors Anaphylaxis   Hctz [Hydrochlorothiazide] Other (See Comments)    Gout    Tramadol Other (See Comments)    05/26/13 urinary retention   Atrovent [Ipratropium] Other (See Comments)    Caused Urinary retention (this is the nasal spray)   Latex Itching and Rash    Skin irritation    BP 138/88   Ht 5\' 8"  (1.727 m)   Wt 252 lb (114.3 kg)   BMI 38.32 kg/m       No data to display              No data to display              Objective:  Physical Exam:  Gen: NAD, comfortable in exam room  Knee exam not repeated today.   Assessment & Plan:  1. Bilateral knee osteoarthritis - euflexxa injections given today - f/u for 2nd of 3 injections in 1 week.  After informed written consent timeout was performed, patient was lying supine on exam table. Right knee was prepped with alcohol swab and utilizing superolateral approach with ultrasound guidance, patient's right knee was injected intraarticularly with 3mL lidocaine followed by euflexxa. Patient tolerated the procedure well without immediate complications.  After informed written consent timeout was performed, patient was lying supine on exam table. Left knee was prepped with alcohol swab and utilizing superolateral approach with ultrasound guidance, patient's left knee was injected intraarticularly with 3mL lidocaine followed by euflexxa. Patient tolerated the procedure well without immediate complications.

## 2023-01-20 ENCOUNTER — Ambulatory Visit: Payer: BC Managed Care – PPO | Admitting: Family Medicine

## 2023-01-20 VITALS — BP 138/86 | Ht 68.0 in | Wt 252.0 lb

## 2023-01-20 DIAGNOSIS — M170 Bilateral primary osteoarthritis of knee: Secondary | ICD-10-CM | POA: Diagnosis not present

## 2023-01-20 NOTE — Progress Notes (Signed)
PCP: Copland, Gwenlyn Found, MD  Subjective:   HPI: Patient is a 62 y.o. male here for bilateral euflexxa injections.  Patient reports mild improvement compared to last visit.  Past Medical History:  Diagnosis Date   BPH (benign prostatic hypertrophy) with urinary retention    CAD (coronary artery disease) 04/2008   successful PCI of the lesion in the proximal LAD using xience drug-eluting stent with improvement in central narrowing from 95% to 0%.  Dr. Juanda Chance   Foley catheter in place    Gout    per pt stable is of 08-18-2014   History of acute bronchitis    recently 07-16-2014   History of sepsis    urosepsis--  08-13-2014 admission   Hyperlipidemia LDL goal <100    Hypertension    Multilevel degenerative disc disease    cervical and lumbar   OSA (obstructive sleep apnea)        Psoriasis    S/P drug eluting coronary stent placement    05-13-2008   DES X1 TO pLAD   Sigmoid diverticulosis     Current Outpatient Medications on File Prior to Visit  Medication Sig Dispense Refill   allopurinol (ZYLOPRIM) 300 MG tablet TAKE 1 TABLET BY MOUTH DAILY 90 tablet 3   aspirin 81 MG tablet Take 1 tablet (81 mg total) by mouth daily. 90 tablet 1   cetirizine (ZYRTEC) 10 MG tablet Take 10 mg by mouth at bedtime.     clonazePAM (KLONOPIN) 0.5 MG tablet Take 1 tablet (0.5 mg total) by mouth 2 (two) times daily as needed for anxiety. 20 tablet 1   Coenzyme Q10 (CO Q 10 PO) Take 1 capsule by mouth every morning. 500mg      finasteride (PROSCAR) 5 MG tablet Take 1 tablet (5 mg total) by mouth daily. 30 tablet 0   fluocinonide (LIDEX) 0.05 % external solution Apply 1 application topically 2 (two) times daily. As needed for psoriasis.  300 ml is a 90 day supply 300 mL 3   fluocinonide cream (LIDEX) 0.05 % Apply 1 application topically 2 (two) times daily. As needed for psoriasis 60 g 2   FLUoxetine (PROZAC) 20 MG capsule Take 1 capsule (20 mg total) by mouth daily. 90 capsule 0   fluticasone  (FLONASE) 50 MCG/ACT nasal spray Place 2 sprays into both nostrils daily. 16 g 1   metFORMIN (GLUCOPHAGE) 500 MG tablet TAKE 1 TABLET BY MOUTH DAILY 90 tablet 3   metoprolol succinate (TOPROL-XL) 100 MG 24 hr tablet Take 1 tablet (100 mg total) by mouth daily. Take with or immediately following a meal. 90 tablet 3   Multiple Vitamin (MULTIVITAMIN) capsule Take 1 capsule by mouth every morning.     nitroGLYCERIN (NITROSTAT) 0.3 MG SL tablet Place 1 tablet (0.3 mg total) under the tongue every 5 (five) minutes as needed for chest pain. 25 tablet 2   Omega-3 Fatty Acids (FISH OIL) 1000 MG CAPS Take 1,000 mg by mouth every morning.     omeprazole (PRILOSEC) 20 MG capsule Take 1 capsule (20 mg total) by mouth 2 (two) times daily before a meal. 180 capsule 3   pregabalin (LYRICA) 300 MG capsule Take 1 capsule (300 mg total) by mouth 2 (two) times daily. 180 capsule 3   rosuvastatin (CRESTOR) 20 MG tablet TAKE 1 TABLET BY MOUTH DAILY 90 tablet 3   tamsulosin (FLOMAX) 0.4 MG CAPS capsule Take 1 capsule (0.4 mg total) by mouth daily after supper. 30 capsule 0   tirzepatide (  MOUNJARO) 7.5 MG/0.5ML Pen Inject 7.5 mg into the skin once a week. 2 mL 2   No current facility-administered medications on file prior to visit.    Past Surgical History:  Procedure Laterality Date   BUNIONECTOMY Right 2000   COLONOSCOPY WITH PROPOFOL  last one 10-06-2013   CORONARY ANGIOPLASTY WITH STENT PLACEMENT  05-13-2008    Dr Charlies Constable   PCI w/  DES x1 to  pLAD/  ef 65%   GREEN LIGHT LASER TURP (TRANSURETHRAL RESECTION OF PROSTATE N/A 08/23/2014   Procedure: GREEN LIGHT LASER TURP (TRANSURETHRAL RESECTION OF PROSTATE;  Surgeon: Bjorn Pippin, MD;  Location: [facility];  Service: Urology;  Laterality: N/A;   WRIST GANGLION EXCISION Left 2001    Allergies  Allergen Reactions   Lidocaine Anaphylaxis    REACTION: anaphylactic shock (also he was on Neomycin sulfate)   Neomycin Sulfate Anaphylaxis     REACTION: anaphylactic shock (also on Lidocaine  topically)   Sulfa Antibiotics Anaphylaxis   Sulfa Drugs Cross Reactors Anaphylaxis   Hctz [Hydrochlorothiazide] Other (See Comments)    Gout    Tramadol Other (See Comments)    05/26/13 urinary retention   Atrovent [Ipratropium] Other (See Comments)    Caused Urinary retention (this is the nasal spray)   Latex Itching and Rash    Skin irritation    BP 138/86   Ht 5\' 8"  (1.727 m)   Wt 252 lb (114.3 kg)   BMI 38.32 kg/m       No data to display              No data to display              Objective:  Physical Exam:  Gen: NAD, comfortable in exam room   Assessment & Plan:  1. Bilateral knee osteoarthritis - euflexxa injections given as below.  F/u in 1 week for third injections.  After informed written consent timeout was performed, patient was lying supine on exam table. Right knee was prepped with alcohol swab and utilizing superolateral approach with ultrasound guidance, patient's right knee was injected intraarticularly with 3mL lidocaine followed by euflexxa. Patient tolerated the procedure well without immediate complications.  After informed written consent timeout was performed, patient was lying supine on exam table. Left knee was prepped with alcohol swab and utilizing superolateral approach with ultrasound guidance, patient's left knee was injected intraarticularly with 3mL lidocaine followed by euflexxa. Patient tolerated the procedure well without immediate complications.

## 2023-01-29 ENCOUNTER — Encounter: Payer: Self-pay | Admitting: Neurology

## 2023-01-30 ENCOUNTER — Ambulatory Visit: Payer: BC Managed Care – PPO | Admitting: Family Medicine

## 2023-01-30 ENCOUNTER — Encounter: Payer: Self-pay | Admitting: Family Medicine

## 2023-01-30 VITALS — BP 134/82 | Ht 68.0 in | Wt 252.0 lb

## 2023-01-30 DIAGNOSIS — M170 Bilateral primary osteoarthritis of knee: Secondary | ICD-10-CM | POA: Diagnosis not present

## 2023-01-30 NOTE — Progress Notes (Signed)
PCP: Copland, Gwenlyn Found, MD  Subjective:   HPI: Patient is a 62 y.o. male here for bilateral knee injections.  Patient returns for third and final euflexxa injections bilateral knees.  Past Medical History:  Diagnosis Date   BPH (benign prostatic hypertrophy) with urinary retention    CAD (coronary artery disease) 04/2008   successful PCI of the lesion in the proximal LAD using xience drug-eluting stent with improvement in central narrowing from 95% to 0%.  Dr. Juanda Chance   Foley catheter in place    Gout    per pt stable is of 08-18-2014   History of acute bronchitis    recently 07-16-2014   History of sepsis    urosepsis--  08-13-2014 admission   Hyperlipidemia LDL goal <100    Hypertension    Multilevel degenerative disc disease    cervical and lumbar   OSA (obstructive sleep apnea)        Psoriasis    S/P drug eluting coronary stent placement    05-13-2008   DES X1 TO pLAD   Sigmoid diverticulosis     Current Outpatient Medications on File Prior to Visit  Medication Sig Dispense Refill   allopurinol (ZYLOPRIM) 300 MG tablet TAKE 1 TABLET BY MOUTH DAILY 90 tablet 3   aspirin 81 MG tablet Take 1 tablet (81 mg total) by mouth daily. 90 tablet 1   cetirizine (ZYRTEC) 10 MG tablet Take 10 mg by mouth at bedtime.     clonazePAM (KLONOPIN) 0.5 MG tablet Take 1 tablet (0.5 mg total) by mouth 2 (two) times daily as needed for anxiety. 20 tablet 1   Coenzyme Q10 (CO Q 10 PO) Take 1 capsule by mouth every morning. 500mg      finasteride (PROSCAR) 5 MG tablet Take 1 tablet (5 mg total) by mouth daily. 30 tablet 0   fluocinonide (LIDEX) 0.05 % external solution Apply 1 application topically 2 (two) times daily. As needed for psoriasis.  300 ml is a 90 day supply 300 mL 3   fluocinonide cream (LIDEX) 0.05 % Apply 1 application topically 2 (two) times daily. As needed for psoriasis 60 g 2   FLUoxetine (PROZAC) 20 MG capsule Take 1 capsule (20 mg total) by mouth daily. 90 capsule 0    fluticasone (FLONASE) 50 MCG/ACT nasal spray Place 2 sprays into both nostrils daily. 16 g 1   metFORMIN (GLUCOPHAGE) 500 MG tablet TAKE 1 TABLET BY MOUTH DAILY 90 tablet 3   metoprolol succinate (TOPROL-XL) 100 MG 24 hr tablet Take 1 tablet (100 mg total) by mouth daily. Take with or immediately following a meal. 90 tablet 3   Multiple Vitamin (MULTIVITAMIN) capsule Take 1 capsule by mouth every morning.     nitroGLYCERIN (NITROSTAT) 0.3 MG SL tablet Place 1 tablet (0.3 mg total) under the tongue every 5 (five) minutes as needed for chest pain. 25 tablet 2   Omega-3 Fatty Acids (FISH OIL) 1000 MG CAPS Take 1,000 mg by mouth every morning.     omeprazole (PRILOSEC) 20 MG capsule Take 1 capsule (20 mg total) by mouth 2 (two) times daily before a meal. 180 capsule 3   pregabalin (LYRICA) 300 MG capsule Take 1 capsule (300 mg total) by mouth 2 (two) times daily. 180 capsule 3   rosuvastatin (CRESTOR) 20 MG tablet TAKE 1 TABLET BY MOUTH DAILY 90 tablet 3   tamsulosin (FLOMAX) 0.4 MG CAPS capsule Take 1 capsule (0.4 mg total) by mouth daily after supper. 30 capsule 0  tirzepatide (MOUNJARO) 7.5 MG/0.5ML Pen Inject 7.5 mg into the skin once a week. 2 mL 2   No current facility-administered medications on file prior to visit.    Past Surgical History:  Procedure Laterality Date   BUNIONECTOMY Right 2000   COLONOSCOPY WITH PROPOFOL  last one 10-06-2013   CORONARY ANGIOPLASTY WITH STENT PLACEMENT  05-13-2008    Dr Charlies Constable   PCI w/  DES x1 to  pLAD/  ef 65%   GREEN LIGHT LASER TURP (TRANSURETHRAL RESECTION OF PROSTATE N/A 08/23/2014   Procedure: GREEN LIGHT LASER TURP (TRANSURETHRAL RESECTION OF PROSTATE;  Surgeon: Bjorn Pippin, MD;  [facility];  Service: Urology;  Laterality: N/A;   WRIST GANGLION EXCISION Left 2001    Allergies  Allergen Reactions   Lidocaine Anaphylaxis    REACTION: anaphylactic shock (also he was on Neomycin sulfate)   Neomycin Sulfate Anaphylaxis     REACTION: anaphylactic shock (also on Lidocaine  topically)   Sulfa Antibiotics Anaphylaxis   Sulfa Drugs Cross Reactors Anaphylaxis   Hctz [Hydrochlorothiazide] Other (See Comments)    Gout    Tramadol Other (See Comments)    05/26/13 urinary retention   Atrovent [Ipratropium] Other (See Comments)    Caused Urinary retention (this is the nasal spray)   Latex Itching and Rash    Skin irritation    BP 134/82   Ht 5\' 8"  (1.727 m)   Wt 252 lb (114.3 kg)   BMI 38.32 kg/m       No data to display              No data to display              Objective:  Physical Exam:  Gen: NAD, comfortable in exam room    Assessment & Plan:  1. Bilateral knee arthritis - third euflexxa injections given today.  F/u prn.  After informed written consent timeout was performed, patient was lying supine on exam table. Right knee was prepped with alcohol swab and utilizing superolateral approach with ultrasound guidance, patient's right knee was injected intraarticularly with 3mL lidocaine followed by euflexxa. Patient tolerated the procedure well without immediate complications.  After informed written consent timeout was performed, patient was lying supine on exam table. Left knee was prepped with alcohol swab and utilizing superolateral approach with ultrasound guidance, patient's left knee was injected intraarticularly with 3mL lidocaine followed by euflexxa. Patient tolerated the procedure well without immediate complications.

## 2023-01-31 DIAGNOSIS — G4733 Obstructive sleep apnea (adult) (pediatric): Secondary | ICD-10-CM | POA: Diagnosis not present

## 2023-02-11 ENCOUNTER — Telehealth: Payer: Self-pay | Admitting: Family Medicine

## 2023-02-11 ENCOUNTER — Encounter: Payer: Self-pay | Admitting: Family Medicine

## 2023-02-11 DIAGNOSIS — Z111 Encounter for screening for respiratory tuberculosis: Secondary | ICD-10-CM

## 2023-02-11 NOTE — Telephone Encounter (Signed)
Pt has been scheduled.  °

## 2023-02-11 NOTE — Telephone Encounter (Signed)
Pt would like to get a TB gold test. States he already discussed this with pcp but I did not see an order on file. Pease advise once the order is in so he can be scheduled.

## 2023-02-13 NOTE — Progress Notes (Deleted)
I saw Charles [name] in neurology clinic on 02/27/23 in follow up for small fiber neuropathy.  HPI: Charles [name] is a 62 y.o. year old male with a history of HTN, pre-diabetes, CAD s/p PCI, plaque psoriasis, OSA (on CPAP), gout, left meniscus tear of knee, OA, GERD, former smoker, BPH, and depression who we last saw on 08/21/22.  To briefly review: 02/14/22: Patient's symptoms started about 1 year ago. His job requires a lot of walking and physical activity on his feet. He rarely gets to get off his feet. He noticed his feet would burn late in day. He noticed it on the top of his foot into his toes. The burning got worse as night went on. He could still get on his feet and walk, but this did not help the sensation. Sitting could make it better, but would get worse at night. If he tried to ignore it, eventually he could go to sleep. When it first occurred though, he could not sleep. Walking makes the symptoms worse. If he rests, it can improve some after about 20-30 minutes. Of note, patient is currently on disability due to left knee problems. He is on his feet less now as a result. Things seemed a little better when off his feet. Now when he walks, he feels it may be even more intense. Over the past year, his symptoms are about the same and not spread. He denies numbness and tingling.    Now his symptoms can range from 2/10 to 9/10 discomfort. He thinks the symptoms average about 4/10.   He denies cramps or twitching. He has no symptoms in his upper extremities. He denies motor weakness. He denies imbalance or falls.   The patient does not report symptoms referable to autonomic dysfunction including impaired sweating, heat or cold intolerance, excessive mucosal dryness, gastroparetic early satiety, postprandial abdominal bloating, constipation, bowel or bladder dyscontrol. He does have some erectile dysfunction for many years (sees urology for BPH). He occasionally has lightheadedness when  standing, which has been present for many years and attributed to BP med use (metoprolol).   The patient does not report any constitutional symptoms like fever, night sweats, anorexia or unintentional weight loss.   EtOH use: None currently, used to drink 1-2 beers with dinner on weekends  Restrictive diet? No Family history of neuropathy/myopathy/NM disease? No   Patient was referred by his PCP, Charles [name]. Per 06/18/21 office note, patient has had foot pain since 01/2021 that is worse when he is on his feet. Symptoms are also worse at night. Patient was put on gabapentin which was changed to Lyrica after 10 days as gabapentin did not seem to change symptoms. Lyrica is helping a little bit. B12 was borderline low at that time, so B12 supplementation was added (1000 mcg daily), he is taking it. Lyrica was increased to 150 mg BID on 08/27/21. This changed helped a little. He is not completely sure because he still has symptoms. He will occasionally have a big flair and wonders if the Lyrica should help with this. Arterial studies of the legs on 12/04/21 were normal.   He is also currently on fluoxetine 20 mg daily for mood and klonopin 0.5 mg PRN for anxiety.  08/21/22: Patient did not tolerate capsaicin cream (extreme burning). Patient tapered fluoxetine and started Cymbalta in 02/2022. Patient had side effects to Cymbalta, so this was reduced, then eventually stopped. Nortriptyline 25 mg qhs was started ~06/17/2022. This was not tolerated (dizziness), so was stopped ~  06/17/22 and fluoxetine was restarted. Lyrica was increased to 200 mg BID. This increase has not significantly helped, but maybe symptoms calm down quicker when patient has not been on his feet.   Patient saw Charles [name] in spine due to back pain radiating into left leg. This has improved though he has a history of low back pain and pinched nerves in the back.   Patient has ongoing left knee pain that is now also affecting the right knee. He is  considering recontacting ortho about this.  Most recent Assessment and Plan (08/21/22): [name], a 61 y.o. male with presumed small fiber neuropathy. His risk factors include pre-diabetes and borderline low B12. He also has a history of low back pain and possibly radiculopathy that could be contributing to his symptoms. Patient has never had an EMG. Given symptoms have been difficult to control without side effects and there is some asymmetry of sensory exam, EMG would be helpful.   Plan: -EMG: PN (R > L) -Continue Lyrica 200 mg BID -Lidocaine cream PRN  -B12 1000 mcg daily  Since their last visit: EMG on 08/26/22 was normal (no evidence of a large fiber neuropathy). Patient's Lyrica was increased to 300 mg BID for his symptoms. Patient tried to lower Lyrica in 09/2022, but had increase in pain so went back to 300 mg BID. Patient was still having symptoms on 01/30/23.   Gabapentin was tried but did not help, so was switched to Lyrica. We tried capsacicin cream, Cymbalta, and nortriptyline. None of these helped or were not tolerated well. He has also taken Alpha lipoic acid. I offered to add Oxcarbazepine on 01/30/23, but patient wanted to think about it further.  ***  ROS: Pertinent positive and negative systems reviewed in HPI. ***   MEDICATIONS:  Outpatient Encounter Medications as of 02/27/2023  Medication Sig   allopurinol (ZYLOPRIM) 300 MG tablet TAKE 1 TABLET BY MOUTH DAILY   aspirin 81 MG tablet Take 1 tablet (81 mg total) by mouth daily.   cetirizine (ZYRTEC) 10 MG tablet Take 10 mg by mouth at bedtime.   clonazePAM (KLONOPIN) 0.5 MG tablet Take 1 tablet (0.5 mg total) by mouth 2 (two) times daily as needed for anxiety.   Coenzyme Q10 (CO Q 10 PO) Take 1 capsule by mouth every morning. 500mg    finasteride (PROSCAR) 5 MG tablet Take 1 tablet (5 mg total) by mouth daily.   fluocinonide (LIDEX) 0.05 % external solution Apply 1 application topically 2 (two) times daily. As  needed for psoriasis.  300 ml is a 90 day supply   fluocinonide cream (LIDEX) 0.05 % Apply 1 application topically 2 (two) times daily. As needed for psoriasis   FLUoxetine (PROZAC) 20 MG capsule Take 1 capsule (20 mg total) by mouth daily.   fluticasone (FLONASE) 50 MCG/ACT nasal spray Place 2 sprays into both nostrils daily.   metFORMIN (GLUCOPHAGE) 500 MG tablet TAKE 1 TABLET BY MOUTH DAILY   metoprolol succinate (TOPROL-XL) 100 MG 24 hr tablet Take 1 tablet (100 mg total) by mouth daily. Take with or immediately following a meal.   Multiple Vitamin (MULTIVITAMIN) capsule Take 1 capsule by mouth every morning.   nitroGLYCERIN (NITROSTAT) 0.3 MG SL tablet Place 1 tablet (0.3 mg total) under the tongue every 5 (five) minutes as needed for chest pain.   Omega-3 Fatty Acids (FISH OIL) 1000 MG CAPS Take 1,000 mg by mouth every morning.   omeprazole (PRILOSEC) 20 MG capsule Take 1 capsule (  20 mg total) by mouth 2 (two) times daily before a meal.   pregabalin (LYRICA) 300 MG capsule Take 1 capsule (300 mg total) by mouth 2 (two) times daily.   rosuvastatin (CRESTOR) 20 MG tablet TAKE 1 TABLET BY MOUTH DAILY   tamsulosin (FLOMAX) 0.4 MG CAPS capsule Take 1 capsule (0.4 mg total) by mouth daily after supper.   tirzepatide (MOUNJARO) 7.5 MG/0.5ML Pen Inject 7.5 mg into the skin once a week.   No facility-administered encounter medications on file as of 02/27/2023.    PAST MEDICAL HISTORY: Past Medical History:  Diagnosis Date   BPH (benign prostatic hypertrophy) with urinary retention    CAD (coronary artery disease) 04/2008   successful PCI of the lesion in the proximal LAD using xience drug-eluting stent with improvement in central narrowing from 95% to 0%.  Dr. Juanda Chance   Foley catheter in place    Gout    per pt stable is of 08-18-2014   History of acute bronchitis    recently 07-16-2014   History of sepsis    urosepsis--  08-13-2014 admission   Hyperlipidemia LDL goal <100    Hypertension     Multilevel degenerative disc disease    cervical and lumbar   OSA (obstructive sleep apnea)        Psoriasis    S/P drug eluting coronary stent placement    05-13-2008   DES X1 TO pLAD   Sigmoid diverticulosis     PAST SURGICAL HISTORY: Past Surgical History:  Procedure Laterality Date   BUNIONECTOMY Right 2000   COLONOSCOPY WITH PROPOFOL  last one 10-06-2013   CORONARY ANGIOPLASTY WITH STENT PLACEMENT  05-13-2008    Dr Charlies Constable   PCI w/  DES x1 to  pLAD/  ef 65%   GREEN LIGHT LASER TURP (TRANSURETHRAL RESECTION OF PROSTATE N/A 08/23/2014   Procedure: GREEN LIGHT LASER TURP (TRANSURETHRAL RESECTION OF PROSTATE;  Surgeon: Bjorn Pippin, MD;  Location: [facility] [place];  Service: Urology;  Laterality: N/A;   WRIST GANGLION EXCISION Left 2001    ALLERGIES: Allergies  Allergen Reactions   Lidocaine Anaphylaxis    REACTION: anaphylactic shock (also he was on Neomycin sulfate)   Neomycin Sulfate Anaphylaxis    REACTION: anaphylactic shock (also on Lidocaine  topically)   Sulfa Antibiotics Anaphylaxis   Sulfa Drugs Cross Reactors Anaphylaxis   Hctz [Hydrochlorothiazide] Other (See Comments)    Gout    Tramadol Other (See Comments)    05/26/13 urinary retention   Atrovent [Ipratropium] Other (See Comments)    Caused Urinary retention (this is the nasal spray)   Latex Itching and Rash    Skin irritation    FAMILY HISTORY: Family History  Problem Relation Age of Onset   Hypertension Father    Stroke Father 51   Lung cancer Mother         smoker   Aneurysm Mother    Diabetes Sister    Heart disease Brother 14        stent LAD 2012   Colon cancer Neg Hx    Pancreatic cancer Neg Hx    Rectal cancer Neg Hx    Stomach cancer Neg Hx     SOCIAL HISTORY: Social History   Tobacco Use   Smoking status: Former    Current packs/day: 0.00    Average packs/day: 0.8 packs/day for 14.0 years (10.5 ttl pk-yrs)    Types: Cigarettes    Start date: 05/28/1983    Quit  date:  05/27/1997    Years since quitting: 25.7   Smokeless tobacco: Former    Types: Snuff    Quit date: 08/17/1997  Vaping Use   Vaping status: Never Used  Substance Use Topics   Alcohol use: Yes    Comment: occasional   Drug use: No   Social History   Social History Narrative   Patient is married to Charles [name]) has 1 child two level home   Patient is right handed   Education level is Bachelor's degree   Caffeine consumption is 1 daily   IT trainer    Objective:  Vital Signs:  There were no vitals taken for this visit.  General:*** General appearance: Awake and alert. No distress. Cooperative with exam.  Skin: No obvious rash or jaundice. HEENT: Atraumatic. Anicteric. Lungs: Non-labored breathing on room air  Heart: Regular Abdomen: Soft, non tender. Extremities: No edema. No obvious deformity.  Musculoskeletal: No obvious joint swelling.  Neurological: Mental Status: Alert. Speech fluent. No pseudobulbar affect Cranial Nerves: CNII: No RAPD. Visual fields intact. CNIII, IV, VI: PERRL. No nystagmus. EOMI. CN V: Facial sensation intact bilaterally to fine touch. [place]eter clench strong. Jaw jerk***. CN VII: Facial muscles symmetric and strong. No ptosis at rest or after sustained upgaze***. CN VIII: Hears finger rub well bilaterally. CN IX: No hypophonia. CN X: Palate elevates symmetrically. CN XI: Full strength shoulder shrug bilaterally. CN XII: Tongue protrusion full and midline. No atrophy or fasciculations. No significant dysarthria*** Motor: Tone is ***. *** fasciculations in *** extremities. *** atrophy. No grip or percussive myotonia.  Individual muscle group testing (MRC grade out of 5):  Movement     Neck flexion ***    Neck extension ***     Right Left   Shoulder abduction *** ***   Shoulder adduction *** ***   Shoulder ext rotation *** ***   Shoulder int rotation *** ***   Elbow flexion *** ***   Elbow extension *** ***   Wrist  extension *** ***   Wrist flexion *** ***   Finger abduction - FDI *** ***   Finger abduction - ADM *** ***   Finger extension *** ***   Finger distal flexion - 2/3 *** ***   Finger distal flexion - 4/5 *** ***   Thumb flexion - FPL *** ***   Thumb abduction - APB *** ***    Hip flexion *** ***   Hip extension *** ***   Hip adduction *** ***   Hip abduction *** ***   Knee extension *** ***   Knee flexion *** ***   Dorsiflexion *** ***   Plantarflexion *** ***   Inversion *** ***   Eversion *** ***   Great toe extension *** ***   Great toe flexion *** ***     Reflexes:  Right Left  Bicep *** ***  Tricep *** ***  BrRad *** ***  Knee *** ***  Ankle *** ***   Pathological Reflexes: Babinski: *** response bilaterally*** Hoffman: *** Troemner: *** Pectoral: *** Palmomental: *** Facial: *** Midline tap: *** Sensation: Pinprick: *** Vibration: *** Temperature: *** Proprioception: *** Coordination: Intact finger-to- nose-finger and heel-to-shin bilaterally. Romberg negative.*** Gait: Able to rise from chair with arms crossed unassisted. Normal, narrow-based gait. Able to tandem walk. Able to walk on toes and heels.***   Lab and Test Review: New results: 10/14/22: HbA1c: 6.6 CMP unremarkable CBC unremarkable Lipid panel significant for TG 437  Skin biopsy (01/07/23): Positive for small fiber neuropathy (thigh  site was normal but ankle site was abnormal)  EMG (08/26/22): NCV & EMG Findings: Extensive electrodiagnostic evaluation of the right lower limb shows: Right sural and superficial peroneal/fibular sensory responses are within normal limits. Right peroneal/fibular (EDB) and tibial (AH) motor responses are within normal limits. Right H reflex latency is within normal limits. There is no evidence of active or chronic motor axon loss changes affecting any of the tested muscles. Motor unit configuration and recruitment pattern is within normal limits.    Impression: This is a normal study. In particular, there is no electrodiagnostic evidence of a right lumbosacral (L3-S1) radiculopathy, large fiber sensorimotor neuropathy, or myopathy.  Previously reviewed results: 02/14/22: B6: wnl IFE: No M protein  SPEP: No M protein  Normal or unremarkable: folate, TSH, CMP, CBC B12 (09/19/21): 492 Ferritin (12/03/21): 31.3; (09/19/21): 21.7, (06/18/21): 7.8 HbA1c (05/25/21): 6.4 (has been ~6 for at least 9 years)   Lumbar xray (02/07/21): FINDINGS: 5 nonrib bearing lumbar-type vertebral bodies.   Vertebral body heights are maintained. No acute fracture.   No static listhesis. No spondylolysis.   Degenerative disease with disc height loss throughout the thoracolumbar spine most severe at L5-S1. Bilateral facet arthropathy at L5-S1.   SI joints are unremarkable.   IMPRESSION: 1. Lumbar spine spondylosis as described above. 2. No acute osseous injury of the lumbar spine.   MRI lumbar spine wo contrast (05/08/13): FINDINGS:  Normal lumbar alignment.  Negative for fracture or mass lesion.   L1-2: Extruded disc fragment with superior extension of extruded  disc material. This appears to be touching the tip of the conus but  not definitely compressing the conus. Given the history of urinary  retention, this could be a factor.   L2-3:  Negative   L3-4: Diffuse disc bulging and mild facet degeneration. Mild spinal  stenosis.   L4-5: Mild disc degeneration and disc bulging. Small left foraminal  disc protrusion.   L5-S1:  Negative   IMPRESSION:  Central disc protrusion at L1-2 with extruded disc fragment  extending cranially. Mild displacement of the tip of the conus  medullaris without definite conus compression. Given the history of  urinary retention, this may be contributing.   Lumbar degenerative changes elsewhere as described above.     MRI thoracic spine wo constrast (05/08/13): FINDINGS:  Negative for fracture or mass in  the thoracic spine. Hemangioma T5  vertebral body.   Spinal cord signal is normal.   Small central disc protrusion at T5-6 with mild flattening of the  ventral surface of the cord. Mild posterior element hypertrophy and  mild spinal stenosis at this level. Remaining disc spaces appear  normal without stenosis or disc protrusion   IMPRESSION:  Central disc protrusion and mild spinal stenosis at T5-6. Otherwise  negative.     MRI cervical spine wo contrast (05/08/13): FINDINGS:  Mild straightening of the normal cervical lordosis. Bone marrow  signal is within normal limits. Cervical cord shows no intra  medullary lesions or edema. Motion artifact is present on the  inversion recovery images. Posterior fossa structures appear normal.  There are flow voids present in both vertebral arteries.   C2-C3:  Negative.   C3-C4: There is a small right posterior lateral protrusion  encroaching on the right neural foramen potentially affecting the  right C4 nerve. The left neural foramen appears patent.   C4-C5:  Negative.   C5-C6: There is a small left paracentral disc protrusion producing  mild central stenosis. This just indents the ventral cord.  Right  foraminal encroachment is present associated with uncovertebral  spurring. The left neural foramen appears patent.   C6-C7: Broad-based left paracentral disc protrusion is present  producing mild to moderate central stenosis with flattening of the  ventral cord and indentation. AP diameter of the thecal sac is  between 8 mm and 9 mm. There is left foraminal stenosis due to  bulging disc and uncovertebral spurring. The right neural foramen  appears patent.   C7-T1:  Negative.   IMPRESSION:  1. 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 Right foraminal stenosis potentially affects the right  C6 nerve.  2. C6-C7 broad-based left eccentric disc protrusion producing mild  to moderate central stenosis  with flattening of the left ventral  cord. Left foraminal stenosis potentially affects the left C7 nerve.  3. Tiny right C3-C4 posterior lateral protrusion narrows the right  neural foramen potentially affecting the right C4 nerve.   ASSESSMENT: [name], a 62 y.o. male with:  ***Small fiber neuropathy  Plan: ***B6, ANA  Return to clinic in ***  Total time spent reviewing records, interview, history/exam, documentation, and coordination of care on day of encounter:  *** min  Jacquelyne Balint, MD

## 2023-02-14 ENCOUNTER — Other Ambulatory Visit: Payer: BC Managed Care – PPO

## 2023-02-14 ENCOUNTER — Ambulatory Visit (INDEPENDENT_AMBULATORY_CARE_PROVIDER_SITE_OTHER): Payer: BC Managed Care – PPO

## 2023-02-14 DIAGNOSIS — Z23 Encounter for immunization: Secondary | ICD-10-CM

## 2023-02-14 DIAGNOSIS — Z111 Encounter for screening for respiratory tuberculosis: Secondary | ICD-10-CM | POA: Diagnosis not present

## 2023-02-14 NOTE — Progress Notes (Signed)
Pt here for flu injection per Dr Patsy Lager Flu shot  given IM in L Deltoid, and pt tolerated injection well. Given VIS and Consent form.    (The Imm clinic button does not have a way to document the order for the order: "[id number]")   DOD: Drue Novel

## 2023-02-17 ENCOUNTER — Encounter: Payer: Self-pay | Admitting: Family Medicine

## 2023-02-17 LAB — QUANTIFERON-TB GOLD PLUS
Mitogen-NIL: >10 IU/mL
NIL: 0.03 IU/mL
QuantiFERON-TB Gold Plus: NEGATIVE
TB1-NIL: <0 IU/mL
TB2-NIL: 0 IU/mL

## 2023-02-26 ENCOUNTER — Encounter: Payer: Self-pay | Admitting: Family Medicine

## 2023-02-27 ENCOUNTER — Other Ambulatory Visit: Payer: Self-pay

## 2023-02-27 ENCOUNTER — Ambulatory Visit: Payer: BC Managed Care – PPO | Admitting: Neurology

## 2023-02-27 DIAGNOSIS — G8929 Other chronic pain: Secondary | ICD-10-CM

## 2023-02-27 MED ORDER — TIRZEPATIDE 10 MG/0.5ML ~~LOC~~ SOAJ
10.0000 mg | SUBCUTANEOUS | 2 refills | Status: DC
Start: 2023-02-27 — End: 2023-03-31

## 2023-02-27 NOTE — Addendum Note (Signed)
Addended by: Abbe Amsterdam C on: 02/27/2023 12:35 PM   Modules accepted: Orders

## 2023-02-28 DIAGNOSIS — G4733 Obstructive sleep apnea (adult) (pediatric): Secondary | ICD-10-CM | POA: Diagnosis not present

## 2023-03-02 DIAGNOSIS — G4733 Obstructive sleep apnea (adult) (pediatric): Secondary | ICD-10-CM | POA: Diagnosis not present

## 2023-03-04 DIAGNOSIS — L400 Psoriasis vulgaris: Secondary | ICD-10-CM | POA: Diagnosis not present

## 2023-03-06 NOTE — Patient Instructions (Addendum)
It was good to see you again today, I will be in touch with your labs asap As we discussed, I suspect your symptoms are due to the musculoskeletal system as opposed to the GI system at this point. However, if you develop any bleeding or other new bowel issues please let me know.  If you decide you would like to pursue further spine/ hip evaluation just let me know!

## 2023-03-06 NOTE — Progress Notes (Signed)
[facility] [address] [place], [place] [address] [phone]  Date:  03/10/2023   Name:  Charles [name]   DOB:  [birth year]   MRN:  [id number]  PCP:  Pearline Cables, MD    Chief Complaint: Rectal Pain (Onset 05/24-06/24/Pt states after starting [facility] had "large" bowel movements and now pt is having rectal pain "on the sides" /Pt  states the "bones hurt" /Increased "gas")   History of Present Illness:  Charles [name] is a 62 y.o. very pleasant male patient who presents with the following:  Patient seen today because of a painful sensation in his behind.  He has been using Mounjaro for weight loss, noted this seemed to cause some extremely large stools which were painful to pass  Her most recent visit was in May although he has sent quite a few MyChart messages in the meantime  History of hypertension, CAD, prediabetes, plaque psoriasis He works in Public affairs consultant; physically demanding job  He notes he is overall doing better but he will have some "burning" over the sciatic notches bilaterally if he drives for a long time or if he has to bend over to pick something up off the ground He has had some issues with his hips/ sciatica  He is not sure if this might be associated with larger than normal BM that seemed to originate with taking mounjaro  His stools have normalized mostly again  Will be worse if he is stilling on something hard like a hard chair for extended period of time or if he has to drive a long distance.  He feels like the pain is now more in his seat bones as opposed in his bowels  Wt Readings from Last 3 Encounters:  03/10/23 256 lb 6.4 oz (116.3 kg)  01/30/23 252 lb (114.3 kg)  01/20/23 252 lb (114.3 kg)   Allopurinol Aspirin 81 Finasteride Metoprolol XL 100 Lyrica 300 twice daily Crestor 20 500 daily Flomax mounjaro 10 mg- we increased a week ago from 7.5,  will work towards more weight loss He is down about 27 lbs total   Lab Results  Component Value Date   HGBA1C 5.6 03/10/2023   COVID booster Flu shot is done  Foot exam Eye exam Urine microalbumin is due Most recent colonoscopy was in 2015, likely due for repeat colon cancer screening next year  Patient Active Problem List   Diagnosis Date Noted   OA (osteoarthritis) of knee 09/19/2021   Diabetes mellitus, type 2 (HCC) 06/28/2019   Sepsis secondary to UTI (HCC) 11/17/2017   UTI (urinary tract infection) 11/17/2017   Left calcaneal bursitis 12/14/2014   Pronation deformity of ankle, acquired 10/26/2014   Allergic reaction 10/14/2014   Achilles tendinosis 10/05/2014   OSA (obstructive sleep apnea) 08/18/2014   Gout 08/14/2014   Obesity 06/02/2013   Abnormal MRI, spine 05/09/2013   H/O urinary retention 05/09/2013   Psoriasis 05/08/2012   Coronary artery disease 08/03/2008   Hyperlipidemia 11/10/2007   History of gout 11/10/2007   Essential hypertension 11/10/2007   G E R D 01/06/2007    Past Medical History:  Diagnosis Date   BPH (benign prostatic hypertrophy) with urinary retention    CAD (coronary artery disease) 04/2008   successful PCI of the lesion in the proximal LAD using xience drug-eluting stent with improvement in central narrowing from 95% to 0%.  Dr. Juanda Chance   Foley catheter in  place    Gout    per pt stable is of 08-18-2014   History of acute bronchitis    recently 07-16-2014   History of sepsis    urosepsis--  08-13-2014 admission   Hyperlipidemia LDL goal <100    Hypertension    Multilevel degenerative disc disease    cervical and lumbar   OSA (obstructive sleep apnea)        Psoriasis    S/P drug eluting coronary stent placement    05-13-2008   DES X1 TO pLAD   Sigmoid diverticulosis     Past Surgical History:  Procedure Laterality Date   BUNIONECTOMY Right 2000   COLONOSCOPY WITH PROPOFOL  last one 10-06-2013   CORONARY ANGIOPLASTY WITH  STENT PLACEMENT  05-13-2008    Dr Charlies Constable   PCI w/  DES x1 to  pLAD/  ef 65%   GREEN LIGHT LASER TURP (TRANSURETHRAL RESECTION OF PROSTATE N/A 08/23/2014   Procedure: GREEN LIGHT LASER TURP (TRANSURETHRAL RESECTION OF PROSTATE;  Surgeon: Bjorn Pippin, MD;  Location: [facility];  Service: Urology;  Laterality: N/A;   WRIST GANGLION EXCISION Left 2001    Social History   Tobacco Use   Smoking status: Former    Current packs/day: 0.00    Average packs/day: 0.8 packs/day for 14.0 years (10.5 ttl pk-yrs)    Types: Cigarettes    Start date: 05/28/1983    Quit date: 05/27/1997    Years since quitting: 25.8   Smokeless tobacco: Former    Types: Snuff    Quit date: 08/17/1997  Vaping Use   Vaping status: Never Used  Substance Use Topics   Alcohol use: Yes    Comment: occasional   Drug use: No    Family History  Problem Relation Age of Onset   Hypertension Father    Stroke Father 71   Lung cancer Mother         smoker   Aneurysm Mother    Diabetes Sister    Heart disease Brother 65        stent LAD 2012   Colon cancer Neg Hx    Pancreatic cancer Neg Hx    Rectal cancer Neg Hx    Stomach cancer Neg Hx     Allergies  Allergen Reactions   Lidocaine Anaphylaxis    REACTION: anaphylactic shock (also he was on Neomycin sulfate)   Neomycin Sulfate Anaphylaxis    REACTION: anaphylactic shock (also on Lidocaine  topically)   Sulfa Antibiotics Anaphylaxis   Sulfa Drugs Cross Reactors Anaphylaxis   Hctz [Hydrochlorothiazide] Other (See Comments)    Gout    Tramadol Other (See Comments)    05/26/13 urinary retention   Atrovent [Ipratropium] Other (See Comments)    Caused Urinary retention (this is the nasal spray)   Latex Itching and Rash    Skin irritation    Medication list has been reviewed and updated.  Current Outpatient Medications on File Prior to Visit  Medication Sig Dispense Refill   allopurinol (ZYLOPRIM) 300 MG tablet TAKE 1 TABLET BY MOUTH DAILY  90 tablet 3   aspirin 81 MG tablet Take 1 tablet (81 mg total) by mouth daily. 90 tablet 1   cetirizine (ZYRTEC) 10 MG tablet Take 10 mg by mouth at bedtime.     clonazePAM (KLONOPIN) 0.5 MG tablet Take 1 tablet (0.5 mg total) by mouth 2 (two) times daily as needed for anxiety. 20 tablet 1   Coenzyme Q10 (CO Q 10  PO) Take 1 capsule by mouth every morning. 500mg      finasteride (PROSCAR) 5 MG tablet Take 1 tablet (5 mg total) by mouth daily. 30 tablet 0   FLUoxetine (PROZAC) 20 MG capsule Take 1 capsule (20 mg total) by mouth daily. 90 capsule 0   fluticasone (FLONASE) 50 MCG/ACT nasal spray Place 2 sprays into both nostrils daily. 16 g 1   metFORMIN (GLUCOPHAGE) 500 MG tablet TAKE 1 TABLET BY MOUTH DAILY 90 tablet 3   metoprolol succinate (TOPROL-XL) 100 MG 24 hr tablet Take 1 tablet (100 mg total) by mouth daily. Take with or immediately following a meal. 90 tablet 3   Multiple Vitamin (MULTIVITAMIN) capsule Take 1 capsule by mouth every morning.     nitroGLYCERIN (NITROSTAT) 0.3 MG SL tablet Place 1 tablet (0.3 mg total) under the tongue every 5 (five) minutes as needed for chest pain. 25 tablet 2   Omega-3 Fatty Acids (FISH OIL) 1000 MG CAPS Take 1,000 mg by mouth every morning.     omeprazole (PRILOSEC) 20 MG capsule Take 1 capsule (20 mg total) by mouth 2 (two) times daily before a meal. 180 capsule 3   pregabalin (LYRICA) 300 MG capsule Take 1 capsule (300 mg total) by mouth 2 (two) times daily. 180 capsule 3   rosuvastatin (CRESTOR) 20 MG tablet TAKE 1 TABLET BY MOUTH DAILY 90 tablet 3   tamsulosin (FLOMAX) 0.4 MG CAPS capsule Take 1 capsule (0.4 mg total) by mouth daily after supper. 30 capsule 0   tirzepatide (MOUNJARO) 10 MG/0.5ML Pen Inject 10 mg into the skin once a week. 2 mL 2   No current facility-administered medications on file prior to visit.    Review of Systems:  As per HPI- otherwise negative.   Physical Examination: Vitals:   03/10/23 0837  BP: 138/82  Pulse: 82   Resp: 18  SpO2: 98%   Vitals:   03/10/23 0837  Weight: 256 lb 6.4 oz (116.3 kg)  Height: 5\' 8"  (1.727 m)   Body mass index is 38.99 kg/m. Ideal Body Weight: Weight in (lb) to have BMI = 25: 164.1  GEN: no acute distress. Obese, looks well  HEENT: Atraumatic, Normocephalic.  Ears and Nose: No external deformity. CV: RRR, No M/G/R. No JVD. No thrill. No extra heart sounds. PULM: CTA B, no wheezes, crackles, rhonchi. No retractions. No resp. distress. No accessory muscle use. ABD: S, NT, ND, +BS. No rebound. No HSM. EXTR: No c/c/e PSYCH: Normally interactive. Conversant.  Rectal exam is normal, no visible hemorrhoids or pain Thoracolumbar range of motion is normal Normal bilateral lower extremity strength, sensation, deep tendon reflex I am not able to reproduce pain, but patient indicates it is located over the bilateral Sivan/sciatic notches Assessment and Plan: Mixed hyperlipidemia  Essential hypertension  Type 2 diabetes mellitus without complication, without long-term current use of insulin (HCC) - Plan: Microalbumin / creatinine urine ratio, Basic metabolic panel, Hemoglobin A1c  Psoriasis - Plan: fluocinonide (LIDEX) 0.05 % external solution, fluocinonide cream (LIDEX) 0.05 %  Buttock pain  Following up today.  Blood pressure is under good control A1c pending He continues to take Crestor for lipid management Refilled his fluocinonide solution and cream that he uses on occasion Discussed the pain he is experiencing.  At this point it sounds like a musculoskeletal issue as opposed to a GI issue.  We wonder if perhaps the constipation associated with GLP-1 and prolonged toilet sitting caused the issue, or could be due to weight  loss and lack of cushion over the pelvic bones.  In any case, he is not having any bowel symptoms such as bleeding and does not have pain with bowel movement.  For the time being he would like to observe and will let me know how things  evolve Signed Abbe Amsterdam, MD  Received labs as below, message to patient  Results for orders placed or performed in visit on 03/10/23  Microalbumin / creatinine urine ratio  Result Value Ref Range   Microalb, Ur <0.7 0.0 - 1.9 mg/dL   Creatinine,U 16.1 mg/dL   Microalb Creat Ratio 0.8 0.0 - 30.0 mg/g  Basic metabolic panel  Result Value Ref Range   Sodium 139 135 - 145 mEq/L   Potassium 4.3 3.5 - 5.1 mEq/L   Chloride 102 96 - 112 mEq/L   CO2 29 19 - 32 mEq/L   Glucose, Bld 118 (H) 70 - 99 mg/dL   BUN 12 6 - 23 mg/dL   Creatinine, Ser 0.96 0.40 - 1.50 mg/dL   GFR 04.54 >09.81 mL/min   Calcium 9.2 8.4 - 10.5 mg/dL  Hemoglobin X9J  Result Value Ref Range   Hgb A1c MFr Bld 5.6 4.6 - 6.5 %

## 2023-03-10 ENCOUNTER — Encounter: Payer: Self-pay | Admitting: Family Medicine

## 2023-03-10 ENCOUNTER — Ambulatory Visit: Payer: BC Managed Care – PPO | Admitting: Family Medicine

## 2023-03-10 VITALS — BP 138/82 | HR 82 | Resp 18 | Ht 68.0 in | Wt 256.4 lb

## 2023-03-10 DIAGNOSIS — L409 Psoriasis, unspecified: Secondary | ICD-10-CM | POA: Diagnosis not present

## 2023-03-10 DIAGNOSIS — E782 Mixed hyperlipidemia: Secondary | ICD-10-CM | POA: Diagnosis not present

## 2023-03-10 DIAGNOSIS — E119 Type 2 diabetes mellitus without complications: Secondary | ICD-10-CM

## 2023-03-10 DIAGNOSIS — I10 Essential (primary) hypertension: Secondary | ICD-10-CM

## 2023-03-10 DIAGNOSIS — Z7984 Long term (current) use of oral hypoglycemic drugs: Secondary | ICD-10-CM

## 2023-03-10 DIAGNOSIS — M7918 Myalgia, other site: Secondary | ICD-10-CM

## 2023-03-10 LAB — BASIC METABOLIC PANEL
BUN: 12 mg/dL (ref 6–23)
CO2: 29 meq/L (ref 19–32)
Calcium: 9.2 mg/dL (ref 8.4–10.5)
Chloride: 102 meq/L (ref 96–112)
Creatinine, Ser: 0.9 mg/dL (ref 0.40–1.50)
GFR: 90.43 mL/min (ref >60.00)
Glucose, Bld: 118 mg/dL — ABNORMAL HIGH (ref 70–99)
Potassium: 4.3 meq/L (ref 3.5–5.1)
Sodium: 139 meq/L (ref 135–145)

## 2023-03-10 LAB — MICROALBUMIN / CREATININE URINE RATIO
Creatinine,U: 93.2 mg/dL
Microalb Creat Ratio: 0.8 mg/g (ref 0.0–30.0)
Microalb, Ur: <0.7 mg/dL (ref 0.0–1.9)

## 2023-03-10 LAB — HEMOGLOBIN A1C: Hgb A1c MFr Bld: 5.6 % (ref 4.6–6.5)

## 2023-03-10 MED ORDER — FLUOCINONIDE 0.05 % EX SOLN: 1.0000 | Freq: Two times a day (BID) | CUTANEOUS | 3 refills | Status: AC

## 2023-03-10 MED ORDER — FLUOCINONIDE 0.05 % EX CREA: 1.0000 | TOPICAL_CREAM | Freq: Two times a day (BID) | CUTANEOUS | 2 refills | Status: AC

## 2023-03-28 ENCOUNTER — Telehealth (HOSPITAL_BASED_OUTPATIENT_CLINIC_OR_DEPARTMENT_OTHER): Payer: Self-pay

## 2023-03-30 ENCOUNTER — Encounter: Payer: Self-pay | Admitting: Family Medicine

## 2023-03-30 DIAGNOSIS — E119 Type 2 diabetes mellitus without complications: Secondary | ICD-10-CM

## 2023-03-31 MED ORDER — TIRZEPATIDE 12.5 MG/0.5ML ~~LOC~~ SOAJ
12.5000 mg | SUBCUTANEOUS | 1 refills | Status: DC
Start: 2023-03-31 — End: 2023-05-23

## 2023-04-02 DIAGNOSIS — G4733 Obstructive sleep apnea (adult) (pediatric): Secondary | ICD-10-CM | POA: Diagnosis not present

## 2023-04-29 ENCOUNTER — Encounter: Payer: Self-pay | Admitting: Family Medicine

## 2023-04-29 NOTE — Telephone Encounter (Signed)
 Care team updated and letter sent for eye exam notes.

## 2023-05-02 DIAGNOSIS — G4733 Obstructive sleep apnea (adult) (pediatric): Secondary | ICD-10-CM | POA: Diagnosis not present

## 2023-05-12 ENCOUNTER — Other Ambulatory Visit: Payer: Self-pay | Admitting: Neurology

## 2023-05-12 ENCOUNTER — Other Ambulatory Visit: Payer: Self-pay | Admitting: Family Medicine

## 2023-05-12 DIAGNOSIS — G629 Polyneuropathy, unspecified: Secondary | ICD-10-CM

## 2023-05-13 ENCOUNTER — Encounter: Payer: Self-pay | Admitting: Neurology

## 2023-05-13 ENCOUNTER — Telehealth: Payer: Self-pay

## 2023-05-14 ENCOUNTER — Telehealth: Payer: Self-pay

## 2023-05-14 ENCOUNTER — Other Ambulatory Visit: Payer: Self-pay | Admitting: Neurology

## 2023-05-14 DIAGNOSIS — G629 Polyneuropathy, unspecified: Secondary | ICD-10-CM

## 2023-05-14 MED ORDER — PREGABALIN 300 MG PO CAPS: 300.0000 mg | ORAL_CAPSULE | Freq: Two times a day (BID) | ORAL | 3 refills | Status: DC

## 2023-05-14 NOTE — Telephone Encounter (Signed)
Sent MY Chart message and called to let patient know Dr. Loleta Chance is sending a 6 month refill. And that I need the name of new pharmacy is he has one.

## 2023-05-19 ENCOUNTER — Encounter: Payer: Self-pay | Admitting: Family Medicine

## 2023-05-19 DIAGNOSIS — Z8739 Personal history of other diseases of the musculoskeletal system and connective tissue: Secondary | ICD-10-CM

## 2023-05-19 DIAGNOSIS — E782 Mixed hyperlipidemia: Secondary | ICD-10-CM

## 2023-05-19 MED ORDER — ALLOPURINOL 300 MG PO TABS: ORAL_TABLET | ORAL | 3 refills | Status: DC

## 2023-05-19 MED ORDER — ROSUVASTATIN CALCIUM 20 MG PO TABS: 20.0000 mg | ORAL_TABLET | Freq: Every day | ORAL | 3 refills | Status: DC

## 2023-05-19 MED ORDER — FLUOXETINE HCL 20 MG PO CAPS: 20.0000 mg | ORAL_CAPSULE | Freq: Every day | ORAL | 0 refills | Status: DC

## 2023-05-23 ENCOUNTER — Other Ambulatory Visit: Payer: Self-pay

## 2023-05-23 MED ORDER — TIRZEPATIDE 15 MG/0.5ML ~~LOC~~ SOAJ: 15.0000 mg | SUBCUTANEOUS | 0 refills | Status: DC

## 2023-05-23 MED ORDER — FLUOXETINE HCL 20 MG PO CAPS: 20.0000 mg | ORAL_CAPSULE | Freq: Every day | ORAL | 3 refills | Status: DC

## 2023-05-29 DIAGNOSIS — G4733 Obstructive sleep apnea (adult) (pediatric): Secondary | ICD-10-CM | POA: Diagnosis not present

## 2023-05-31 DIAGNOSIS — G4733 Obstructive sleep apnea (adult) (pediatric): Secondary | ICD-10-CM | POA: Diagnosis not present

## 2023-06-02 DIAGNOSIS — G4733 Obstructive sleep apnea (adult) (pediatric): Secondary | ICD-10-CM | POA: Diagnosis not present

## 2023-06-04 ENCOUNTER — Ambulatory Visit: Payer: BC Managed Care – PPO | Admitting: Family Medicine

## 2023-06-05 ENCOUNTER — Ambulatory Visit: Payer: BC Managed Care – PPO | Admitting: Family Medicine

## 2023-06-05 ENCOUNTER — Encounter: Payer: Self-pay | Admitting: Family Medicine

## 2023-06-05 ENCOUNTER — Other Ambulatory Visit (HOSPITAL_BASED_OUTPATIENT_CLINIC_OR_DEPARTMENT_OTHER): Payer: Self-pay

## 2023-06-05 VITALS — BP 139/79 | HR 85 | Temp 97.5°F | Ht 68.0 in | Wt 248.0 lb

## 2023-06-05 DIAGNOSIS — J0140 Acute pansinusitis, unspecified: Secondary | ICD-10-CM | POA: Diagnosis not present

## 2023-06-05 MED ORDER — DOXYCYCLINE HYCLATE 100 MG PO TABS
100.0000 mg | ORAL_TABLET | Freq: Two times a day (BID) | ORAL | 0 refills | Status: AC
  Filled 2023-06-05: qty 14, 7d supply, fill #0

## 2023-06-05 MED ORDER — PREDNISONE 20 MG PO TABS
40.0000 mg | ORAL_TABLET | Freq: Every day | ORAL | 0 refills | Status: AC
Start: 2023-06-05 — End: 2023-06-10
  Filled 2023-06-05: qty 10, 5d supply, fill #0

## 2023-06-05 NOTE — Progress Notes (Signed)
 Acute Office Visit  Subjective:     Patient ID: Charles [name], male    DOB: 04-26-1961, 63 y.o.   MRN: [id number]  Chief Complaint  Patient presents with   Ear Problem    HPI Patient is in today for ear pain.   Discussed the use of AI scribe software for clinical note transcription with the patient, who gave verbal consent to proceed.  History of Present Illness   The patient, with a history of allergies, has been experiencing increased sinusitis with discharge since October. The discharge has been changing colors from red to green to yellow. The patient has been managing the symptoms with a daily neti pot, which provides temporary relief. About a week and a half ago, the patient started experiencing ear pain, especially when pressure is applied to the area around the ears. The right ear is more affected than the left. The patient has had similar episodes in the past, usually once or twice a year during winter.  The patient also reports congestion, watery eyes, and pressure in the sinus areas. There is no coughing, sore throat, or fever, but the patient feels warm. The ear discomfort is rated as 6 or 7 out of 10. The patient has been taking Excedrin for the pain and discomfort, Coricidin, and Mucinex . The patient is also on regular allergy medications, Zyrtec and Flonase .  The patient has a history of allergy testing and shots in their twenties, showing sensitivities to many different things, with mold and dust being the strongest. The patient stopped the allergy shots due to a negative reaction and has been managing the allergies since then.         ROS All review of systems negative except what is listed in the HPI      Objective:    BP 139/79   Pulse 85   Temp (!) 97.5 F (36.4 C) (Oral)   Ht 5' 8 (1.727 m)   Wt 248 lb (112.5 kg)   SpO2 95%   BMI 37.71 kg/m    Physical Exam Vitals reviewed.  Constitutional:      General: He is not in acute distress.     Appearance: Normal appearance. He is obese. He is not ill-appearing.  HENT:     Head: Normocephalic and atraumatic.     Comments: Maxillary/frontal sinuses tender to palpation     Right Ear: A middle ear effusion is present.     Left Ear: A middle ear effusion is present.     Nose: Congestion present.     Mouth/Throat:     Pharynx: No oropharyngeal exudate or posterior oropharyngeal erythema.  Cardiovascular:     Rate and Rhythm: Normal rate and regular rhythm.     Heart sounds: Normal heart sounds.  Pulmonary:     Effort: Pulmonary effort is normal.     Breath sounds: Normal breath sounds.  Skin:    General: Skin is warm and dry.  Neurological:     Mental Status: He is alert and oriented to person, place, and time.  Psychiatric:        Mood and Affect: Mood normal.        Behavior: Behavior normal.        Thought Content: Thought content normal.        Judgment: Judgment normal.     No results found for any visits on 06/05/23.      Assessment & Plan:   Problem List Items Addressed This Visit  None Visit Diagnoses       Acute non-recurrent pansinusitis    -  Primary   Relevant Medications   predniSONE  (DELTASONE ) 20 MG tablet   doxycycline  (VIBRA -TABS) 100 MG tablet      Chronic sinusitis since October/November with increased sinus discharge, congestion, and pressure. Recent onset of ear pain, more pronounced on the right side. No cough, sore throat, or fever. Currently using neti pot, Excedrin for pain, Coricidin, Mucinex , Zyrtec, and Flonase . -Start Doxycycline   -Start Prednisone  burst  -Continue Flonase , Zyrtec, and neti pot/supportive measures. -Check blood pressure regularly due to Prednisone  use.      Meds ordered this encounter  Medications   predniSONE  (DELTASONE ) 20 MG tablet    Sig: Take 2 tablets (40 mg total) by mouth daily with breakfast for 5 days.    Dispense:  10 tablet    Refill:  0    Supervising Provider:   DOMENICA BLACKBIRD A [[id number]]    doxycycline  (VIBRA -TABS) 100 MG tablet    Sig: Take 1 tablet (100 mg total) by mouth 2 (two) times daily for 7 days.    Dispense:  14 tablet    Refill:  0    Supervising Provider:   DOMENICA BLACKBIRD A [[id number]]    Return if symptoms worsen or fail to improve.  Waddell KATHEE Mon, NP

## 2023-06-12 NOTE — Telephone Encounter (Signed)
 Charles [name]

## 2023-06-17 ENCOUNTER — Other Ambulatory Visit: Payer: Self-pay | Admitting: Family Medicine

## 2023-06-19 ENCOUNTER — Encounter (INDEPENDENT_AMBULATORY_CARE_PROVIDER_SITE_OTHER): Payer: BC Managed Care – PPO | Admitting: Family Medicine

## 2023-06-19 DIAGNOSIS — J0141 Acute recurrent pansinusitis: Secondary | ICD-10-CM

## 2023-06-19 MED ORDER — AMOXICILLIN-POT CLAVULANATE 875-125 MG PO TABS: 1.0000 | ORAL_TABLET | Freq: Two times a day (BID) | ORAL | 0 refills | Status: DC

## 2023-06-19 NOTE — Addendum Note (Signed)
Addended bySilvio Pate on: 06/19/2023 01:55 PM   Modules accepted: Orders

## 2023-06-19 NOTE — Telephone Encounter (Signed)
Please see the MyChart message reply(ies) for my assessment and plan.    This patient gave consent for this Medical Advice Message and is aware that it may result in a bill to their insurance company, as well as the possibility of receiving a bill for a co-payment or deductible. They are an established patient, but are not seeking medical advice exclusively about a problem treated during an in person or video visit in the last seven days. I did not recommend an in person or video visit within seven days of my reply.    I spent a total of 5 minutes cumulative time within 7 days through MyChart messaging.  Taylor B Beck, NP   

## 2023-06-29 DIAGNOSIS — G4733 Obstructive sleep apnea (adult) (pediatric): Secondary | ICD-10-CM | POA: Diagnosis not present

## 2023-07-03 DIAGNOSIS — G4733 Obstructive sleep apnea (adult) (pediatric): Secondary | ICD-10-CM | POA: Diagnosis not present

## 2023-07-07 ENCOUNTER — Encounter: Payer: Self-pay | Admitting: Family Medicine

## 2023-07-07 MED ORDER — MOUNJARO 15 MG/0.5ML ~~LOC~~ SOAJ
15.0000 mg | SUBCUTANEOUS | 1 refills | Status: DC
Start: 2023-07-07 — End: 2024-01-05

## 2023-07-07 MED ORDER — MOUNJARO 15 MG/0.5ML ~~LOC~~ SOAJ: 15.0000 mg | SUBCUTANEOUS | 1 refills | Status: DC

## 2023-07-07 NOTE — Addendum Note (Signed)
 Addended by: Gates Kasal C on: 07/07/2023 12:37 PM   Modules accepted: Orders

## 2023-07-14 ENCOUNTER — Encounter: Payer: Self-pay | Admitting: Family Medicine

## 2023-07-21 NOTE — Progress Notes (Signed)
 Spoke with [employer] rep regarding coverage for Euflexxa - [id number] Pt covered at 100% , no PA required. CPT code 16109 - pt would be responsible for a $50 copay Ref# [id number]

## 2023-07-27 DIAGNOSIS — G4733 Obstructive sleep apnea (adult) (pediatric): Secondary | ICD-10-CM | POA: Diagnosis not present

## 2023-08-04 ENCOUNTER — Other Ambulatory Visit: Payer: Self-pay

## 2023-08-04 ENCOUNTER — Ambulatory Visit: Payer: BC Managed Care – PPO | Admitting: Family Medicine

## 2023-08-04 VITALS — BP 138/82 | Ht 68.0 in | Wt 240.0 lb

## 2023-08-04 DIAGNOSIS — M170 Bilateral primary osteoarthritis of knee: Secondary | ICD-10-CM

## 2023-08-04 DIAGNOSIS — S46212A Strain of muscle, fascia and tendon of other parts of biceps, left arm, initial encounter: Secondary | ICD-10-CM | POA: Diagnosis not present

## 2023-08-05 ENCOUNTER — Encounter: Payer: Self-pay | Admitting: Family Medicine

## 2023-08-05 NOTE — Progress Notes (Signed)
 PCP: Copland, Gwenlyn Found, MD  Subjective:   HPI: Patient is a 63 y.o. male here for bilateral knee pain, left shoulder/arm pain.  Patient returns to start euflexxa series for his bilateral knee arthritis. Overall has done well with this though soreness has started to come back. Also over a month ago was moving boxes and felt a pop in his left arm - difficult to localize but felt from shoulder to elbow area. No bruising or swelling after this. Bothers more when using the arm but over past couple weeks this has improved quite a bit. Pain was a burning quality.  Past Medical History:  Diagnosis Date   BPH (benign prostatic hypertrophy) with urinary retention    CAD (coronary artery disease) 04/2008   successful PCI of the lesion in the proximal LAD using xience drug-eluting stent with improvement in central narrowing from 95% to 0%.  Dr. Juanda Chance   Foley catheter in place    Gout    per pt stable is of 08-18-2014   History of acute bronchitis    recently 07-16-2014   History of sepsis    urosepsis--  08-13-2014 admission   Hyperlipidemia LDL goal <100    Hypertension    Multilevel degenerative disc disease    cervical and lumbar   OSA (obstructive sleep apnea)        Psoriasis    S/P drug eluting coronary stent placement    05-13-2008   DES X1 TO pLAD   Sigmoid diverticulosis     Current Outpatient Medications on File Prior to Visit  Medication Sig Dispense Refill   allopurinol (ZYLOPRIM) 300 MG tablet TAKE 1 TABLET BY MOUTH  DAILY 90 tablet 3   amoxicillin-clavulanate (AUGMENTIN) 875-125 MG tablet Take 1 tablet by mouth 2 (two) times daily. 20 tablet 0   aspirin 81 MG tablet Take 1 tablet (81 mg total) by mouth daily. 90 tablet 1   cetirizine (ZYRTEC) 10 MG tablet Take 10 mg by mouth at bedtime.     clonazePAM (KLONOPIN) 0.5 MG tablet Take 1 tablet (0.5 mg total) by mouth 2 (two) times daily as needed for anxiety. 20 tablet 1   Coenzyme Q10 (CO Q 10 PO) Take 1 capsule by mouth  every morning. 500mg      finasteride (PROSCAR) 5 MG tablet Take 1 tablet (5 mg total) by mouth daily. 30 tablet 0   fluocinonide (LIDEX) 0.05 % external solution Apply 1 Application topically 2 (two) times daily. As needed for psoriasis.  300 ml is a 90 day supply 300 mL 3   fluocinonide cream (LIDEX) 0.05 % Apply 1 Application topically 2 (two) times daily. As needed for psoriasis 60 g 2   FLUoxetine (PROZAC) 20 MG capsule Take 1 capsule (20 mg total) by mouth daily. 90 capsule 3   fluticasone (FLONASE) 50 MCG/ACT nasal spray Place 2 sprays into both nostrils daily. 16 g 1   metFORMIN (GLUCOPHAGE) 500 MG tablet TAKE 1 TABLET BY MOUTH DAILY 90 tablet 3   metoprolol succinate (TOPROL-XL) 100 MG 24 hr tablet Take 1 tablet (100 mg total) by mouth daily. Take with or immediately following a meal. 90 tablet 3   Multiple Vitamin (MULTIVITAMIN) capsule Take 1 capsule by mouth every morning.     nitroGLYCERIN (NITROSTAT) 0.3 MG SL tablet Place 1 tablet (0.3 mg total) under the tongue every 5 (five) minutes as needed for chest pain. (Patient not taking: Reported on 06/05/2023) 25 tablet 2   Omega-3 Fatty Acids (FISH OIL)  1000 MG CAPS Take 1,000 mg by mouth every morning.     omeprazole (PRILOSEC) 20 MG capsule Take 1 capsule (20 mg total) by mouth 2 (two) times daily before a meal. 180 capsule 1   pregabalin (LYRICA) 300 MG capsule Take 1 capsule (300 mg total) by mouth 2 (two) times daily. 180 capsule 3   rosuvastatin (CRESTOR) 20 MG tablet Take 1 tablet (20 mg total) by mouth daily. 90 tablet 3   tamsulosin (FLOMAX) 0.4 MG CAPS capsule Take 1 capsule (0.4 mg total) by mouth daily after supper. 30 capsule 0   tirzepatide (MOUNJARO) 15 MG/0.5ML Pen Inject 15 mg into the skin once a week. 6 mL 1   No current facility-administered medications on file prior to visit.    Past Surgical History:  Procedure Laterality Date   BUNIONECTOMY Right 2000   COLONOSCOPY WITH PROPOFOL  last one 10-06-2013   CORONARY  ANGIOPLASTY WITH STENT PLACEMENT  05-13-2008    Dr Charlies Constable   PCI w/  DES x1 to  pLAD/  ef 65%   GREEN LIGHT LASER TURP (TRANSURETHRAL RESECTION OF PROSTATE N/A 08/23/2014   Procedure: GREEN LIGHT LASER TURP (TRANSURETHRAL RESECTION OF PROSTATE;  Surgeon: Bjorn Pippin, MD;  Location: [facility];  Service: Urology;  Laterality: N/A;   WRIST GANGLION EXCISION Left 2001    Allergies  Allergen Reactions   Lidocaine Anaphylaxis    REACTION: anaphylactic shock (also he was on Neomycin sulfate)   Neomycin Sulfate Anaphylaxis    REACTION: anaphylactic shock (also on Lidocaine  topically)   Sulfa Antibiotics Anaphylaxis   Sulfa Drugs Cross Reactors Anaphylaxis   Hctz [Hydrochlorothiazide] Other (See Comments)    Gout    Tramadol Other (See Comments)    05/26/13 urinary retention   Atrovent [Ipratropium] Other (See Comments)    Caused Urinary retention (this is the nasal spray)   Latex Itching and Rash    Skin irritation    BP 138/82   Ht 5\' 8"  (1.727 m)   Wt 240 lb (108.9 kg)   BMI 36.49 kg/m       No data to display              No data to display              Objective:  Physical Exam:  Gen: NAD, comfortable in exam room  Bilateral knees: No effusion, bruising, deformity. Mild tenderness medial joint lines.  Left elbow/arm/shoulder: No swelling, ecchymoses.  No gross deformity. No TTP. FROM elbow and shoulder. Negative yergasons. Biceps felt with hook test. NV intact distally.  Limited MSK u/s left arm:  Biceps and bicipital tendon intact though small amount of fluid just deep to distal tendon.     Assessment & Plan:  1. Bilateral knee arthritis - visco series started today both knees.  After informed written consent timeout was performed, patient was lying supine on exam table. Right knee was prepped with alcohol swab and utilizing superolateral approach with ultrasound guidance, patient's right knee was injected intraarticularly with 3mL  lidocaine followed by euflexxa. Patient tolerated the procedure well without immediate complications.  After informed written consent timeout was performed, patient was lying supine on exam table. Left knee was prepped with alcohol swab and utilizing superolateral approach with ultrasound guidance, patient's left knee was injected intraarticularly with 3mL lidocaine followed by euflexxa. Patient tolerated the procedure well without immediate complications.  2. Left arm pain - consistent with biceps strain.  Ultrasound is  reassuring.  Icing, advised against heavy lifting.  Consider physical therapy though he's much improved compared to when this occurred.  Voltaren gel if needed.

## 2023-08-11 ENCOUNTER — Encounter: Payer: Self-pay | Admitting: Family Medicine

## 2023-08-11 ENCOUNTER — Ambulatory Visit: Admitting: Family Medicine

## 2023-08-11 ENCOUNTER — Other Ambulatory Visit: Payer: Self-pay

## 2023-08-11 VITALS — BP 138/77 | Ht 68.0 in | Wt 240.0 lb

## 2023-08-11 DIAGNOSIS — M170 Bilateral primary osteoarthritis of knee: Secondary | ICD-10-CM

## 2023-08-11 NOTE — Progress Notes (Signed)
 PCP: Copland, Gwenlyn Found, MD  Subjective:   HPI: Patient is a 63 y.o. male here for bilateral euflexxa injections.  Patient without much relief so far and here for second set of injections  Past Medical History:  Diagnosis Date   BPH (benign prostatic hypertrophy) with urinary retention    CAD (coronary artery disease) 04/2008   successful PCI of the lesion in the proximal LAD using xience drug-eluting stent with improvement in central narrowing from 95% to 0%.  Dr. Juanda Chance   Foley catheter in place    Gout    per pt stable is of 08-18-2014   History of acute bronchitis    recently 07-16-2014   History of sepsis    urosepsis--  08-13-2014 admission   Hyperlipidemia LDL goal <100    Hypertension    Multilevel degenerative disc disease    cervical and lumbar   OSA (obstructive sleep apnea)        Psoriasis    S/P drug eluting coronary stent placement    05-13-2008   DES X1 TO pLAD   Sigmoid diverticulosis     Current Outpatient Medications on File Prior to Visit  Medication Sig Dispense Refill   allopurinol (ZYLOPRIM) 300 MG tablet TAKE 1 TABLET BY MOUTH  DAILY 90 tablet 3   amoxicillin-clavulanate (AUGMENTIN) 875-125 MG tablet Take 1 tablet by mouth 2 (two) times daily. 20 tablet 0   aspirin 81 MG tablet Take 1 tablet (81 mg total) by mouth daily. 90 tablet 1   cetirizine (ZYRTEC) 10 MG tablet Take 10 mg by mouth at bedtime.     clonazePAM (KLONOPIN) 0.5 MG tablet Take 1 tablet (0.5 mg total) by mouth 2 (two) times daily as needed for anxiety. 20 tablet 1   Coenzyme Q10 (CO Q 10 PO) Take 1 capsule by mouth every morning. 500mg      finasteride (PROSCAR) 5 MG tablet Take 1 tablet (5 mg total) by mouth daily. 30 tablet 0   fluocinonide (LIDEX) 0.05 % external solution Apply 1 Application topically 2 (two) times daily. As needed for psoriasis.  300 ml is a 90 day supply 300 mL 3   fluocinonide cream (LIDEX) 0.05 % Apply 1 Application topically 2 (two) times daily. As needed for  psoriasis 60 g 2   FLUoxetine (PROZAC) 20 MG capsule Take 1 capsule (20 mg total) by mouth daily. 90 capsule 3   fluticasone (FLONASE) 50 MCG/ACT nasal spray Place 2 sprays into both nostrils daily. 16 g 1   metFORMIN (GLUCOPHAGE) 500 MG tablet TAKE 1 TABLET BY MOUTH DAILY 90 tablet 3   metoprolol succinate (TOPROL-XL) 100 MG 24 hr tablet Take 1 tablet (100 mg total) by mouth daily. Take with or immediately following a meal. 90 tablet 3   Multiple Vitamin (MULTIVITAMIN) capsule Take 1 capsule by mouth every morning.     nitroGLYCERIN (NITROSTAT) 0.3 MG SL tablet Place 1 tablet (0.3 mg total) under the tongue every 5 (five) minutes as needed for chest pain. (Patient not taking: Reported on 06/05/2023) 25 tablet 2   Omega-3 Fatty Acids (FISH OIL) 1000 MG CAPS Take 1,000 mg by mouth every morning.     omeprazole (PRILOSEC) 20 MG capsule Take 1 capsule (20 mg total) by mouth 2 (two) times daily before a meal. 180 capsule 1   pregabalin (LYRICA) 300 MG capsule Take 1 capsule (300 mg total) by mouth 2 (two) times daily. 180 capsule 3   rosuvastatin (CRESTOR) 20 MG tablet Take 1 tablet (  20 mg total) by mouth daily. 90 tablet 3   tamsulosin (FLOMAX) 0.4 MG CAPS capsule Take 1 capsule (0.4 mg total) by mouth daily after supper. 30 capsule 0   tirzepatide (MOUNJARO) 15 MG/0.5ML Pen Inject 15 mg into the skin once a week. 6 mL 1   No current facility-administered medications on file prior to visit.    Past Surgical History:  Procedure Laterality Date   BUNIONECTOMY Right 2000   COLONOSCOPY WITH PROPOFOL  last one 10-06-2013   CORONARY ANGIOPLASTY WITH STENT PLACEMENT  05-13-2008    Dr Charlies Constable   PCI w/  DES x1 to  pLAD/  ef 65%   GREEN LIGHT LASER TURP (TRANSURETHRAL RESECTION OF PROSTATE N/A 08/23/2014   Procedure: GREEN LIGHT LASER TURP (TRANSURETHRAL RESECTION OF PROSTATE;  Surgeon: Bjorn Pippin, MD;  Location: [facility];  Service: Urology;  Laterality: N/A;   WRIST GANGLION EXCISION  Left 2001    Allergies  Allergen Reactions   Lidocaine Anaphylaxis    REACTION: anaphylactic shock (also he was on Neomycin sulfate)   Neomycin Sulfate Anaphylaxis    REACTION: anaphylactic shock (also on Lidocaine  topically)   Sulfa Antibiotics Anaphylaxis   Sulfa Drugs Cross Reactors Anaphylaxis   Hctz [Hydrochlorothiazide] Other (See Comments)    Gout    Tramadol Other (See Comments)    05/26/13 urinary retention   Atrovent [Ipratropium] Other (See Comments)    Caused Urinary retention (this is the nasal spray)   Latex Itching and Rash    Skin irritation    BP 138/77   Ht 5\' 8"  (1.727 m)   Wt 240 lb (108.9 kg)   BMI 36.49 kg/m       No data to display              No data to display              Objective:  Physical Exam:  Gen: NAD, comfortable in exam room   Assessment & Plan:  1. Bilateral knee osteoarthritis - second set of euflexxa injections given today.  Follow up in 1 week for third injections.  After informed written consent timeout was performed, patient was lying supine on exam table. Right knee was prepped with alcohol swab and utilizing superolateral approach with ultrasound guidance, patient's right knee was injected intraarticularly with 3mL lidocaine followed by euflexxa. Patient tolerated the procedure well without immediate complications.  After informed written consent timeout was performed, patient was lying supine on exam table. Left knee was prepped with alcohol swab and utilizing superolateral approach with ultrasound guidance, patient's left knee was injected intraarticularly with 3mL lidocaine followed by euflexxa. Patient tolerated the procedure well without immediate complications.

## 2023-08-17 ENCOUNTER — Encounter: Payer: Self-pay | Admitting: Family Medicine

## 2023-08-18 ENCOUNTER — Other Ambulatory Visit: Payer: Self-pay

## 2023-08-18 ENCOUNTER — Encounter: Payer: Self-pay | Admitting: Family Medicine

## 2023-08-18 ENCOUNTER — Ambulatory Visit: Admitting: Family Medicine

## 2023-08-18 VITALS — BP 128/88 | Ht 68.0 in | Wt 240.0 lb

## 2023-08-18 DIAGNOSIS — M170 Bilateral primary osteoarthritis of knee: Secondary | ICD-10-CM

## 2023-08-18 NOTE — Patient Instructions (Signed)
 Let me know how you're doing in 2 weeks. At that point would consider MRI of your more symptomatic knee or ortho referral if not improving.

## 2023-08-18 NOTE — Progress Notes (Signed)
 PCP: Copland, Gwenlyn Found, MD  Subjective:   HPI: Patient is a 63 y.o. male here for bilateral knee pain.  Patient here for third set of euflexxa injections to both knees. Only minimal improvement so far - still very hard to get around.  Past Medical History:  Diagnosis Date   BPH (benign prostatic hypertrophy) with urinary retention    CAD (coronary artery disease) 04/2008   successful PCI of the lesion in the proximal LAD using xience drug-eluting stent with improvement in central narrowing from 95% to 0%.  Dr. Juanda Chance   Foley catheter in place    Gout    per pt stable is of 08-18-2014   History of acute bronchitis    recently 07-16-2014   History of sepsis    urosepsis--  08-13-2014 admission   Hyperlipidemia LDL goal <100    Hypertension    Multilevel degenerative disc disease    cervical and lumbar   OSA (obstructive sleep apnea)        Psoriasis    S/P drug eluting coronary stent placement    05-13-2008   DES X1 TO pLAD   Sigmoid diverticulosis     Current Outpatient Medications on File Prior to Visit  Medication Sig Dispense Refill   allopurinol (ZYLOPRIM) 300 MG tablet TAKE 1 TABLET BY MOUTH  DAILY 90 tablet 3   amoxicillin-clavulanate (AUGMENTIN) 875-125 MG tablet Take 1 tablet by mouth 2 (two) times daily. 20 tablet 0   aspirin 81 MG tablet Take 1 tablet (81 mg total) by mouth daily. 90 tablet 1   cetirizine (ZYRTEC) 10 MG tablet Take 10 mg by mouth at bedtime.     clonazePAM (KLONOPIN) 0.5 MG tablet Take 1 tablet (0.5 mg total) by mouth 2 (two) times daily as needed for anxiety. 20 tablet 1   Coenzyme Q10 (CO Q 10 PO) Take 1 capsule by mouth every morning. 500mg      finasteride (PROSCAR) 5 MG tablet Take 1 tablet (5 mg total) by mouth daily. 30 tablet 0   fluocinonide (LIDEX) 0.05 % external solution Apply 1 Application topically 2 (two) times daily. As needed for psoriasis.  300 ml is a 90 day supply 300 mL 3   fluocinonide cream (LIDEX) 0.05 % Apply 1 Application  topically 2 (two) times daily. As needed for psoriasis 60 g 2   FLUoxetine (PROZAC) 20 MG capsule Take 1 capsule (20 mg total) by mouth daily. 90 capsule 3   fluticasone (FLONASE) 50 MCG/ACT nasal spray Place 2 sprays into both nostrils daily. 16 g 1   metFORMIN (GLUCOPHAGE) 500 MG tablet TAKE 1 TABLET BY MOUTH DAILY 90 tablet 3   metoprolol succinate (TOPROL-XL) 100 MG 24 hr tablet Take 1 tablet (100 mg total) by mouth daily. Take with or immediately following a meal. 90 tablet 3   Multiple Vitamin (MULTIVITAMIN) capsule Take 1 capsule by mouth every morning.     nitroGLYCERIN (NITROSTAT) 0.3 MG SL tablet Place 1 tablet (0.3 mg total) under the tongue every 5 (five) minutes as needed for chest pain. (Patient not taking: Reported on 06/05/2023) 25 tablet 2   Omega-3 Fatty Acids (FISH OIL) 1000 MG CAPS Take 1,000 mg by mouth every morning.     omeprazole (PRILOSEC) 20 MG capsule Take 1 capsule (20 mg total) by mouth 2 (two) times daily before a meal. 180 capsule 1   pregabalin (LYRICA) 300 MG capsule Take 1 capsule (300 mg total) by mouth 2 (two) times daily. 180 capsule 3  rosuvastatin (CRESTOR) 20 MG tablet Take 1 tablet (20 mg total) by mouth daily. 90 tablet 3   tamsulosin (FLOMAX) 0.4 MG CAPS capsule Take 1 capsule (0.4 mg total) by mouth daily after supper. 30 capsule 0   tirzepatide (MOUNJARO) 15 MG/0.5ML Pen Inject 15 mg into the skin once a week. 6 mL 1   No current facility-administered medications on file prior to visit.    Past Surgical History:  Procedure Laterality Date   BUNIONECTOMY Right 2000   COLONOSCOPY WITH PROPOFOL  last one 10-06-2013   CORONARY ANGIOPLASTY WITH STENT PLACEMENT  05-13-2008    Dr Charlies Constable   PCI w/  DES x1 to  pLAD/  ef 65%   GREEN LIGHT LASER TURP (TRANSURETHRAL RESECTION OF PROSTATE N/A 08/23/2014   Procedure: GREEN LIGHT LASER TURP (TRANSURETHRAL RESECTION OF PROSTATE;  Surgeon: Bjorn Pippin, MD;  Location: [facility];  Service: Urology;   Laterality: N/A;   WRIST GANGLION EXCISION Left 2001    Allergies  Allergen Reactions   Lidocaine Anaphylaxis    REACTION: anaphylactic shock (also he was on Neomycin sulfate)   Neomycin Sulfate Anaphylaxis    REACTION: anaphylactic shock (also on Lidocaine  topically)   Sulfa Antibiotics Anaphylaxis   Sulfa Drugs Cross Reactors Anaphylaxis   Hctz [Hydrochlorothiazide] Other (See Comments)    Gout    Tramadol Other (See Comments)    05/26/13 urinary retention   Atrovent [Ipratropium] Other (See Comments)    Caused Urinary retention (this is the nasal spray)   Latex Itching and Rash    Skin irritation    BP 128/88   Ht 5\' 8"  (1.727 m)   Wt 240 lb (108.9 kg)   BMI 36.49 kg/m       No data to display              No data to display              Objective:  Physical Exam:  Gen: NAD, comfortable in exam room  Knee exam not repeated today.   Assessment & Plan:  1. Bilateral knee pain - due to arthritis.  On radiographs this was only mild though suspect worse than appears on x-rays.  We previously ordered MRI though he did not go through with this - discussed unlikely to change management given he has arthritis underlying this but can consider since arthritis only mild on radiographs.  Other option to see ortho to discuss knee replacement if not improving.  Will let us know how he's doing in 2 weeks.  After informed written consent timeout was performed, patient was lying supine on exam table. Right knee was prepped with alcohol swab and utilizing superolateral approach with ultrasound guidance, patient's right knee was injected intraarticularly with 3mL lidocaine followed by euflexxa. Patient tolerated the procedure well without immediate complications.  After informed written consent timeout was performed, patient was lying supine on exam table. Left knee was prepped with alcohol swab and utilizing superolateral approach with ultrasound guidance, patient's left knee  was injected intraarticularly with 3mL lidocaine followed by euflexxa. Patient tolerated the procedure well without immediate complications.

## 2023-08-22 ENCOUNTER — Other Ambulatory Visit: Payer: Self-pay | Admitting: Family Medicine

## 2023-08-22 DIAGNOSIS — I10 Essential (primary) hypertension: Secondary | ICD-10-CM

## 2023-08-26 ENCOUNTER — Other Ambulatory Visit: Payer: Self-pay

## 2023-08-26 DIAGNOSIS — M170 Bilateral primary osteoarthritis of knee: Secondary | ICD-10-CM

## 2023-08-27 NOTE — Addendum Note (Signed)
 Addended by: Rutha Bouchard E on: 08/27/2023 11:40 AM   Modules accepted: Orders

## 2023-08-28 DIAGNOSIS — G4733 Obstructive sleep apnea (adult) (pediatric): Secondary | ICD-10-CM | POA: Diagnosis not present

## 2023-08-30 DIAGNOSIS — G4733 Obstructive sleep apnea (adult) (pediatric): Secondary | ICD-10-CM | POA: Diagnosis not present

## 2023-09-03 DIAGNOSIS — Z0279 Encounter for issue of other medical certificate: Secondary | ICD-10-CM

## 2023-09-08 DIAGNOSIS — R972 Elevated prostate specific antigen [PSA]: Secondary | ICD-10-CM | POA: Diagnosis not present

## 2023-09-08 DIAGNOSIS — R339 Retention of urine, unspecified: Secondary | ICD-10-CM | POA: Diagnosis not present

## 2023-09-08 DIAGNOSIS — R310 Gross hematuria: Secondary | ICD-10-CM | POA: Diagnosis not present

## 2023-09-08 DIAGNOSIS — N5201 Erectile dysfunction due to arterial insufficiency: Secondary | ICD-10-CM | POA: Diagnosis not present

## 2023-09-10 ENCOUNTER — Other Ambulatory Visit (INDEPENDENT_AMBULATORY_CARE_PROVIDER_SITE_OTHER): Payer: Self-pay

## 2023-09-10 ENCOUNTER — Encounter: Payer: Self-pay | Admitting: Orthopedic Surgery

## 2023-09-10 ENCOUNTER — Ambulatory Visit: Admitting: Orthopedic Surgery

## 2023-09-10 DIAGNOSIS — M25562 Pain in left knee: Secondary | ICD-10-CM

## 2023-09-10 DIAGNOSIS — M25561 Pain in right knee: Secondary | ICD-10-CM

## 2023-09-10 NOTE — Telephone Encounter (Signed)
 Ok for both

## 2023-09-12 ENCOUNTER — Encounter: Payer: Self-pay | Admitting: Orthopedic Surgery

## 2023-09-12 NOTE — Progress Notes (Signed)
 Office Visit Note   Patient: Charles [name]           Date of Birth: [birth year]           MRN: [id number] Visit Date: 09/10/2023 Requested by: Salina Craver, MD [address] [place],  [place] [address] PCP: Kaylee Partridge, MD  Subjective: Chief Complaint  Patient presents with   Right Knee - Pain   Left Knee - Pain    HPI: Charles [name] is a 63 y.o. male who presents to the office reporting bilateral knee pain.  Pain has been going on since January 2023.  Denies any history of injury but does report acute onset.  Patient has had cortisone Synvisc and Euflexxa.  He has also tried physical therapy.  He also has done activity modification with being out of work for 3 months in 2023.  The physical therapy consisted of nonload bearing quad strengthening exercises plus a home exercise program which she did for at least 6 weeks.  Initially pain was primarily in the left knee but the right knee started hurting 2 months ago as well.  Recent injections in March gave him no relief.  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  Stairs are difficult for him.  Ladder work is also difficult.  Uses a brace on his knees.  No prior knee surgery.  He did tried naproxen without much relief.  Ladder work is about 5 to 10% of his job.  He is currently on short-term disability.  Has had prior radiographs of the right knee in 2024 and the left knee in 2023..  Radiographs of both knees show medial joint space narrowing with maintenance of the lateral and patellofemoral joint spaces.  Possible loose body in the posterior aspect of the left knee joint.              ROS: All systems reviewed are negative as they relate to the chief complaint within the history of present illness.  Patient denies fevers or chills.  Assessment & Plan: Visit Diagnoses:  1. Pain  in both knees, unspecified chronicity   2. Right knee pain, unspecified chronicity     Plan: Impression is bilateral knee pain right worse than left.  Question here is whether or not there is anything arthroscopically treatable in the knee.  He has had an extensive course of nonoperative treatment including physical therapy injections as well as activity modification.  Currently out on short-term disability because his knee function is not really compatible with lateral work and a lot of stairs.  Our decision point for [place] is really between arthroscopic intervention versus knee replacement.  Need to get full assessment of all joint surfaces before deciding on arthroscopic debridement versus partial knee replacement versus total knee replacement.  He has failed a long course of conservative treatment.  MRI scanning of each knee would help to point us  in the right direction for the optimal and most efficacious and efficient treatment plan.  Follow-Up Instructions: No follow-ups on file.   Orders:  Orders Placed This Encounter  Procedures   XR KNEE 3 VIEW RIGHT   XR Knee 1-2 Views Left   MR Knee Right w/o contrast   No orders of the defined types were placed in this encounter.     Procedures: No procedures  performed   Clinical Data: No additional findings.  Objective: Vital Signs: There were no vitals taken for this visit.  Physical Exam:  Constitutional: Patient appears well-developed HEENT:  Head: Normocephalic Eyes:EOM are normal Neck: Normal range of motion Cardiovascular: Normal rate Pulmonary/chest: Effort normal Neurologic: Patient is alert Skin: Skin is warm Psychiatric: Patient has normal mood and affect  Ortho Exam: Ortho exam demonstrates full range of motion of both knees from 0-1 20.  Collateral and cruciate ligaments are stable.  Has medial greater than lateral joint line tenderness but normal alignment.  No groin pain with internal/external [place] of the leg.   Pedal pulses palpable.  Ankle dorsiflexion intact.  Mild patellofemoral crepitus is present bilaterally without apprehension.  Specialty Comments:  No specialty comments available.  Imaging: No results found.   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/2014   Allergic reaction 10/14/2014   Achilles tendinosis 10/05/2014   OSA (obstructive sleep apnea) 08/18/2014   Gout 08/14/2014   Obesity 06/02/2013   Abnormal MRI, spine 05/09/2013   H/O urinary retention 05/09/2013   Psoriasis 05/08/2012   Coronary artery disease 08/03/2008   Hyperlipidemia 11/10/2007   History of gout 11/10/2007   Essential hypertension 11/10/2007   G E R D 01/06/2007   Past Medical History:  Diagnosis Date   BPH (benign prostatic hypertrophy) with urinary retention    CAD (coronary artery disease) 04/2008   successful PCI of the lesion in the proximal LAD using xience drug-eluting stent with improvement in central narrowing from 95% to 0%.  Dr. Grandville Lax   Foley catheter in place    Gout    per pt stable is of 08-18-2014   History of acute bronchitis    recently 07-16-2014   History of sepsis    urosepsis--  08-13-2014 admission   Hyperlipidemia LDL goal <100    Hypertension    Multilevel degenerative disc disease    cervical and lumbar   OSA (obstructive sleep apnea)        Psoriasis    S/P drug eluting coronary stent placement    05-13-2008   DES X1 TO pLAD   Sigmoid diverticulosis     Family History  Problem Relation Age of Onset   Hypertension Father    Stroke Father 34   Lung cancer Mother         smoker   Aneurysm Mother    Diabetes Sister    Heart disease Brother 83        stent LAD 2012   Colon cancer  Neg Hx    Pancreatic cancer Neg Hx    Rectal cancer Neg Hx    Stomach cancer Neg Hx     Past Surgical History:  Procedure Laterality Date   BUNIONECTOMY Right 2000   COLONOSCOPY WITH PROPOFOL   last one 10-06-2013   CORONARY ANGIOPLASTY WITH STENT PLACEMENT  05-13-2008    Dr Lavina Pou   PCI w/  DES x1 to  pLAD/  ef 65%   GREEN LIGHT LASER TURP (TRANSURETHRAL RESECTION OF PROSTATE N/A 08/23/2014   Procedure: GREEN LIGHT LASER TURP (TRANSURETHRAL RESECTION OF PROSTATE;  Surgeon: Homero Luster, MD;  Location: [facility] [place];  Service: Urology;  Laterality: N/A;  WRIST GANGLION EXCISION Left 2001   Social History   Occupational History   Occupation: Naval architect  Tobacco Use   Smoking status: Former    Current packs/day: 0.00    Average packs/day: 0.8 packs/day for 14.0 years (10.5 ttl pk-yrs)    Types: Cigarettes    Start date: 05/28/1983    Quit date: 05/27/1997    Years since quitting: 26.3   Smokeless tobacco: Former    Types: Snuff    Quit date: 08/17/1997  Vaping Use   Vaping status: Never Used  Substance and Sexual Activity   Alcohol  use: Yes    Comment: occasional   Drug use: No   Sexual activity: Not on file

## 2023-09-27 DIAGNOSIS — G4733 Obstructive sleep apnea (adult) (pediatric): Secondary | ICD-10-CM | POA: Diagnosis not present

## 2023-10-08 DIAGNOSIS — M25561 Pain in right knee: Secondary | ICD-10-CM | POA: Diagnosis not present

## 2023-10-08 DIAGNOSIS — S83242A Other tear of medial meniscus, current injury, left knee, initial encounter: Secondary | ICD-10-CM | POA: Diagnosis not present

## 2023-10-08 DIAGNOSIS — M25562 Pain in left knee: Secondary | ICD-10-CM | POA: Diagnosis not present

## 2023-10-08 DIAGNOSIS — M170 Bilateral primary osteoarthritis of knee: Secondary | ICD-10-CM | POA: Diagnosis not present

## 2023-10-08 DIAGNOSIS — S83231A Complex tear of medial meniscus, current injury, right knee, initial encounter: Secondary | ICD-10-CM | POA: Diagnosis not present

## 2023-10-15 NOTE — Progress Notes (Unsigned)
 [facility] [address] [place], [place] [address] [phone]  Date:  10/22/2023   Name:  Charles [name]   DOB:  [birth year]   MRN:  [id number]  PCP:  Kaylee Partridge, MD    Chief Complaint: No chief complaint on file.   History of Present Illness:  Charles [name] is a 63 y.o. very pleasant male patient who presents with the following:  Patient seen today for periodic follow-up.  Most recent visit with myself was in October History of hypertension, CAD,diabetes, plaque psoriasis He works in Public affairs consultant; physically demanding job  We have been using Mounjaro  for diabetes control and weight loss-he is currently taking 15 mg with good results.  He will have to pause the medication for a few weeks coming up to have knee surgery  Lab Results  Component Value Date   HGBA1C 5.6 03/10/2023   Allopurinol  300 Aspirin  81 Klonopin  as needed Topical steroid Fluoxetine  20 Metformin  500 Toprol  XL 100 Crestor , omega-3 Tamsulosin  Mounjaro  15 mg  Eye exam Can update A1c and other labs Colon cancer screening appears to be due Can offer pneumonia vaccine; diabetes, will soon be 63 Shingrix is complete  Most recent labs done in October-BMP, A1c, urine microalbumin  Myoview  completed 2020, negative  Patient Active Problem List   Diagnosis Date Noted   OA (osteoarthritis) of knee 09/19/2021   Diabetes mellitus, type 2 (HCC) 06/28/2019   Sepsis secondary to UTI (HCC) 11/17/2017   UTI (urinary tract infection) 11/17/2017   Left calcaneal bursitis 12/14/2014   Pronation deformity of ankle, acquired 10/26/2014   Allergic reaction 10/14/2014   Achilles tendinosis 10/05/2014   OSA (obstructive sleep apnea) 08/18/2014   Gout 08/14/2014   Obesity 06/02/2013   Abnormal MRI, spine 05/09/2013   H/O urinary retention 05/09/2013   Psoriasis 05/08/2012   Coronary artery disease 08/03/2008    Hyperlipidemia 11/10/2007   History of gout 11/10/2007   Essential hypertension 11/10/2007   G E R D 01/06/2007    Past Medical History:  Diagnosis Date   BPH (benign prostatic hypertrophy) with urinary retention    CAD (coronary artery disease) 04/2008   successful PCI of the lesion in the proximal LAD using xience drug-eluting stent with improvement in central narrowing from 95% to 0%.  Dr. Grandville Lax   Foley catheter in place    Gout    per pt stable is of 08-18-2014   History of acute bronchitis    recently 07-16-2014   History of sepsis    urosepsis--  08-13-2014 admission   Hyperlipidemia LDL goal <100    Hypertension    Multilevel degenerative disc disease    cervical and lumbar   OSA (obstructive sleep apnea)        Psoriasis    S/P drug eluting coronary stent placement    05-13-2008   DES X1 TO pLAD   Sigmoid diverticulosis     Past Surgical History:  Procedure Laterality Date   BUNIONECTOMY Right 2000   COLONOSCOPY WITH PROPOFOL   last one 10-06-2013   CORONARY ANGIOPLASTY WITH STENT PLACEMENT  05-13-2008    Dr Lavina Pou   PCI w/  DES x1 to  pLAD/  ef 65%   GREEN LIGHT LASER TURP (TRANSURETHRAL RESECTION OF PROSTATE N/A 08/23/2014   Procedure: GREEN LIGHT LASER TURP (TRANSURETHRAL RESECTION OF PROSTATE;  Surgeon: Homero Luster, MD;  [facility];  Service: Urology;  Laterality: N/A;   WRIST GANGLION EXCISION Left 2001    Social History   Tobacco Use   Smoking status: Former    Current packs/day: 0.00    Average packs/day: 0.8 packs/day for 14.0 years (10.5 ttl pk-yrs)    Types: Cigarettes    Start date: 05/28/1983    Quit date: 05/27/1997    Years since quitting: 26.4   Smokeless tobacco: Former    Types: Snuff    Quit date: 08/17/1997  Vaping Use   Vaping status: Never Used  Substance Use Topics   Alcohol  use: Yes    Comment: occasional   Drug use: No    Family History  Problem Relation Age of Onset   Hypertension Father     Stroke Father 63   Lung cancer Mother         smoker   Aneurysm Mother    Diabetes Sister    Heart disease Brother 24        stent LAD 2012   Colon cancer Neg Hx    Pancreatic cancer Neg Hx    Rectal cancer Neg Hx    Stomach cancer Neg Hx     Allergies  Allergen Reactions   Lidocaine Anaphylaxis    REACTION: anaphylactic shock (also he was on Neomycin sulfate)   Neomycin Sulfate Anaphylaxis    REACTION: anaphylactic shock (also on Lidocaine  topically)   Sulfa Antibiotics Anaphylaxis   Sulfa Drugs Cross Reactors Anaphylaxis   Hctz [Hydrochlorothiazide] Other (See Comments)    Gout    Tramadol  Other (See Comments)    05/26/13 urinary retention   Atrovent  [Ipratropium] Other (See Comments)    Caused Urinary retention (this is the nasal spray)   Latex Itching and Rash    Skin irritation    Medication list has been reviewed and updated.  Current Outpatient Medications on File Prior to Visit  Medication Sig Dispense Refill   allopurinol  (ZYLOPRIM ) 300 MG tablet TAKE 1 TABLET BY MOUTH  DAILY 90 tablet 3   amoxicillin -clavulanate (AUGMENTIN ) 875-125 MG tablet Take 1 tablet by mouth 2 (two) times daily. 20 tablet 0   aspirin  81 MG tablet Take 1 tablet (81 mg total) by mouth daily. 90 tablet 1   cetirizine (ZYRTEC) 10 MG tablet Take 10 mg by mouth at bedtime.     clonazePAM  (KLONOPIN ) 0.5 MG tablet Take 1 tablet (0.5 mg total) by mouth 2 (two) times daily as needed for anxiety. 20 tablet 1   Coenzyme Q10 (CO Q 10 PO) Take 1 capsule by mouth every morning. 500mg      finasteride  (PROSCAR ) 5 MG tablet Take 1 tablet (5 mg total) by mouth daily. 30 tablet 0   fluocinonide  (LIDEX ) 0.05 % external solution Apply 1 Application topically 2 (two) times daily. As needed for psoriasis.  300 ml is a 90 day supply 300 mL 3   fluocinonide  cream (LIDEX ) 0.05 % Apply 1 Application topically 2 (two) times daily. As needed for psoriasis 60 g 2   FLUoxetine  (PROZAC ) 20 MG capsule Take 1 capsule (20 mg  total) by mouth daily. 90 capsule 3   fluticasone  (FLONASE ) 50 MCG/ACT nasal spray Place 2 sprays into both nostrils daily. 16 g 1   metFORMIN  (GLUCOPHAGE ) 500 MG tablet TAKE 1 TABLET BY MOUTH DAILY 90 tablet 3   metoprolol  succinate (TOPROL -XL) 100 MG 24 hr tablet Take 1 tablet (100 mg total) by mouth daily. Take with or immediately following a meal 90 tablet 0   Multiple  Vitamin (MULTIVITAMIN) capsule Take 1 capsule by mouth every morning.     nitroGLYCERIN  (NITROSTAT ) 0.3 MG SL tablet Place 1 tablet (0.3 mg total) under the tongue every 5 (five) minutes as needed for chest pain. (Patient not taking: Reported on 06/05/2023) 25 tablet 2   Omega-3 Fatty Acids (FISH OIL) 1000 MG CAPS Take 1,000 mg by mouth every morning.     omeprazole  (PRILOSEC) 20 MG capsule Take 1 capsule (20 mg total) by mouth 2 (two) times daily before a meal. 180 capsule 1   pregabalin  (LYRICA ) 300 MG capsule Take 1 capsule (300 mg total) by mouth 2 (two) times daily. 180 capsule 3   rosuvastatin  (CRESTOR ) 20 MG tablet Take 1 tablet (20 mg total) by mouth daily. 90 tablet 3   tamsulosin  (FLOMAX ) 0.4 MG CAPS capsule Take 1 capsule (0.4 mg total) by mouth daily after supper. 30 capsule 0   tirzepatide  (MOUNJARO ) 15 MG/0.5ML Pen Inject 15 mg into the skin once a week. 6 mL 1   No current facility-administered medications on file prior to visit.    Review of Systems:  As per HPI- otherwise negative.   Physical Examination: There were no vitals filed for this visit. There were no vitals filed for this visit. There is no height or weight on file to calculate BMI. Ideal Body Weight:    GEN: no acute distress. HEENT: Atraumatic, Normocephalic.  Ears and Nose: No external deformity. CV: RRR, No M/G/R. No JVD. No thrill. No extra heart sounds. PULM: CTA B, no wheezes, crackles, rhonchi. No retractions. No resp. distress. No accessory muscle use. ABD: S, NT, ND, +BS. No rebound. No HSM. EXTR: No c/c/e PSYCH: Normally  interactive. Conversant.    Assessment and Plan: ***  Signed Gates Kasal, MD

## 2023-10-15 NOTE — Patient Instructions (Signed)
 It was good to see you today!

## 2023-10-17 ENCOUNTER — Ambulatory Visit: Payer: BC Managed Care – PPO | Admitting: Internal Medicine

## 2023-10-22 ENCOUNTER — Ambulatory Visit (INDEPENDENT_AMBULATORY_CARE_PROVIDER_SITE_OTHER): Admitting: Family Medicine

## 2023-10-22 ENCOUNTER — Encounter: Payer: Self-pay | Admitting: Family Medicine

## 2023-10-22 ENCOUNTER — Telehealth: Payer: Self-pay

## 2023-10-22 ENCOUNTER — Ambulatory Visit: Admitting: Orthopedic Surgery

## 2023-10-22 ENCOUNTER — Other Ambulatory Visit: Payer: Self-pay

## 2023-10-22 ENCOUNTER — Other Ambulatory Visit (HOSPITAL_COMMUNITY): Payer: Self-pay

## 2023-10-22 VITALS — BP 140/80 | HR 79 | Temp 98.2°F | Ht 68.0 in | Wt 249.0 lb

## 2023-10-22 DIAGNOSIS — E538 Deficiency of other specified B group vitamins: Secondary | ICD-10-CM

## 2023-10-22 DIAGNOSIS — K219 Gastro-esophageal reflux disease without esophagitis: Secondary | ICD-10-CM

## 2023-10-22 DIAGNOSIS — M1712 Unilateral primary osteoarthritis, left knee: Secondary | ICD-10-CM

## 2023-10-22 DIAGNOSIS — I10 Essential (primary) hypertension: Secondary | ICD-10-CM

## 2023-10-22 DIAGNOSIS — Z111 Encounter for screening for respiratory tuberculosis: Secondary | ICD-10-CM

## 2023-10-22 DIAGNOSIS — Z01818 Encounter for other preprocedural examination: Secondary | ICD-10-CM | POA: Diagnosis not present

## 2023-10-22 DIAGNOSIS — E782 Mixed hyperlipidemia: Secondary | ICD-10-CM

## 2023-10-22 DIAGNOSIS — Z125 Encounter for screening for malignant neoplasm of prostate: Secondary | ICD-10-CM | POA: Diagnosis not present

## 2023-10-22 DIAGNOSIS — E119 Type 2 diabetes mellitus without complications: Secondary | ICD-10-CM | POA: Diagnosis not present

## 2023-10-22 DIAGNOSIS — Z8639 Personal history of other endocrine, nutritional and metabolic disease: Secondary | ICD-10-CM

## 2023-10-22 DIAGNOSIS — S83241D Other tear of medial meniscus, current injury, right knee, subsequent encounter: Secondary | ICD-10-CM

## 2023-10-22 DIAGNOSIS — Z1211 Encounter for screening for malignant neoplasm of colon: Secondary | ICD-10-CM

## 2023-10-22 DIAGNOSIS — Z7985 Long-term (current) use of injectable non-insulin antidiabetic drugs: Secondary | ICD-10-CM

## 2023-10-22 MED ORDER — OMEPRAZOLE 20 MG PO CPDR: 20.0000 mg | DELAYED_RELEASE_CAPSULE | Freq: Two times a day (BID) | ORAL | 3 refills | Status: DC

## 2023-10-22 NOTE — Telephone Encounter (Signed)
 Pharmacy Patient Advocate Encounter   Received notification from Onbase that prior authorization for Omeprazole  20MG  dr capsules  is required/requested.   Insurance verification completed.   The patient is insured through [facility] .   Per test claim: PA required; PA submitted to above mentioned insurance via CoverMyMeds Key/confirmation #/EOC [id number] Status is pending

## 2023-10-22 NOTE — Telephone Encounter (Signed)
 Pharmacy Patient Advocate Encounter  Received notification from [employer] that Prior Authorization for Omeprazole  20MG  dr capsules has been APPROVED from 10/22/23 to 10/21/24. Unable to obtain price due to refill too soon rejection, last fill date 10/22/23 next available fill date08/11/25   PA #/Case ID/Reference #: [id number]

## 2023-10-23 ENCOUNTER — Encounter: Payer: Self-pay | Admitting: Family Medicine

## 2023-10-23 ENCOUNTER — Encounter: Payer: Self-pay | Admitting: Orthopedic Surgery

## 2023-10-23 LAB — CBC
HCT: 45.5 % (ref 39.0–52.0)
Hemoglobin: 15.5 g/dL (ref 13.0–17.0)
MCHC: 34 g/dL (ref 30.0–36.0)
MCV: 89.5 fl (ref 78.0–100.0)
Platelets: 223 10*3/uL (ref 150.0–400.0)
RBC: 5.08 Mil/uL (ref 4.22–5.81)
RDW: 13.7 % (ref 11.5–15.5)
WBC: 7.4 10*3/uL (ref 4.0–10.5)

## 2023-10-23 LAB — COMPREHENSIVE METABOLIC PANEL WITH GFR
ALT: 24 U/L (ref 0–53)
AST: 22 U/L (ref 0–37)
Albumin: 4.5 g/dL (ref 3.5–5.2)
Alkaline Phosphatase: 44 U/L (ref 39–117)
BUN: 15 mg/dL (ref 6–23)
CO2: 29 meq/L (ref 19–32)
Calcium: 9.2 mg/dL (ref 8.4–10.5)
Chloride: 101 meq/L (ref 96–112)
Creatinine, Ser: 0.89 mg/dL (ref 0.40–1.50)
GFR: 90.34 mL/min (ref >60.00)
Glucose, Bld: 78 mg/dL (ref 70–99)
Potassium: 4 meq/L (ref 3.5–5.1)
Sodium: 138 meq/L (ref 135–145)
Total Bilirubin: 0.8 mg/dL (ref 0.2–1.2)
Total Protein: 6.8 g/dL (ref 6.0–8.3)

## 2023-10-23 LAB — LIPID PANEL
Cholesterol: 110 mg/dL (ref 0–200)
HDL: 33.1 mg/dL — ABNORMAL LOW (ref >39.00)
LDL Cholesterol: 36 mg/dL (ref 0–99)
NonHDL: 76.69
Total CHOL/HDL Ratio: 3
Triglycerides: 205 mg/dL — ABNORMAL HIGH (ref 0.0–149.0)
VLDL: 41 mg/dL — ABNORMAL HIGH (ref 0.0–40.0)

## 2023-10-23 LAB — VITAMIN B12: Vitamin B-12: 1028 pg/mL — ABNORMAL HIGH (ref 211–911)

## 2023-10-23 LAB — HEMOGLOBIN A1C: Hgb A1c MFr Bld: 5.6 % (ref 4.6–6.5)

## 2023-10-23 LAB — FERRITIN: Ferritin: 76.9 ng/mL (ref 22.0–322.0)

## 2023-10-23 NOTE — Progress Notes (Signed)
 Office Visit Note   Patient: Charles [name]           Date of Birth: 01-Jan-1961           MRN: [id number] Visit Date: 10/22/2023 Requested by: Kaylee Partridge, MD [address] [place],  [place] [address] PCP: Kaylee Partridge, MD  Subjective: No chief complaint on file.   HPI: Charles [name] is a 63 y.o. male who presents to the office reporting continued bilateral knee pain right worse than left.  Since he was last seen he has had MRI scanning of both the right knee and the left knee.  Symptoms ongoing for longer than 3 months.  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  He does report swelling as well as mechanical symptoms in the right knee and mostly pain in the left knee.  Left knee started earlier about 2 years ago and the right knee started about 9 months ago.  He has had injections into the knees which have given him short-term relief.  He also describes decreased standing endurance particularly on the left-hand side.  Localizes the pain to the medial aspect of both knees.  Patient has no personal or family history of DVT or pulmonary embolism.  He denies any back pain or radicular symptoms.  He has tried an exercise regimen of nonweightbearing quad strengthening exercises without sustained relief with subsequent improvement in quad tone.  MRI scan does show very mild arthritis in the medial compartment on the right-hand side with significant medial meniscal tear with flap underneath the tibial plateau.  The patellofemoral and lateral compartments appear to be spared.  On the left knee there is grade IV chondromalacia on both the medial femoral condyle and medial tibial plateau with degenerative macerated meniscal tearing with no unstable flaps visible.  Lateral and patellofemoral compartments appear to be reasonably well spared in the left knee..                ROS: All systems reviewed are negative as they relate to the chief  complaint within the history of present illness.  Patient denies fevers or chills.  Assessment & Plan: Visit Diagnoses:  1. Acute medial meniscus tear of right knee, subsequent encounter   2. Arthritis of left knee     Plan: Impression is symptomatic bilateral knee pathology with meniscal pathology on the right-hand side on the medial side and arthritic pathology on the left knee in the medial compartment.  At this time patient would like to have his right knee addressed within the 3 months that he has left on his short-term disability.  I think it is feasible that he would be able to have surgery and be recovered in 3 months in order to resume work.  The risk and benefits of knee arthroscopy are discussed with the patient including not limited to infection nerve vessel damage incomplete pain relief as well as incomplete restoration of function.  The expected rehabilitation course is also discussed.  All questions answered  Follow-Up Instructions: No follow-ups on file.   Orders:  No orders of the defined types were placed in this encounter.  No orders of the defined types were placed in this encounter.     Procedures: No procedures performed   Clinical Data: No additional findings.  Objective: Vital Signs: There were no vitals taken for this visit.  Physical Exam:  Constitutional: Patient appears well-developed HEENT:  Head: Normocephalic Eyes:EOM  are normal Neck: Normal range of motion Cardiovascular: Normal rate Pulmonary/chest: Effort normal Neurologic: Patient is alert Skin: Skin is warm Psychiatric: Patient has normal mood and affect  Ortho Exam: Ortho exam demonstrates normal gait and alignment with slight varus alignment on that left knee compared to the right knee.  Pedal pulses palpable.  No groin pain with internal or external rotation of either leg.  Range of motion is full in both knees.  Collateral and cruciate ligaments are stable.  Patient does have medial  joint line tenderness on the right with positive McMurray compression testing.  Also has medial joint line tenderness on the left with fairly minimal patellofemoral crepitus bilaterally.  Mild effusion left knee trace effusion right knee.  Specialty Comments:  No specialty comments available.  Imaging: No results found.   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/2014   Allergic reaction 10/14/2014   Achilles tendinosis 10/05/2014   OSA (obstructive sleep apnea) 08/18/2014   Gout 08/14/2014   Obesity 06/02/2013   Abnormal MRI, spine 05/09/2013   H/O urinary retention 05/09/2013   Psoriasis 05/08/2012   Coronary artery disease 08/03/2008   Hyperlipidemia 11/10/2007   History of gout 11/10/2007   Essential hypertension 11/10/2007   G E R D 01/06/2007   Past Medical History:  Diagnosis Date   BPH (benign prostatic hypertrophy) with urinary retention    CAD (coronary artery disease) 04/2008   successful PCI of the lesion in the proximal LAD using xience drug-eluting stent with improvement in central narrowing from 95% to 0%.  Dr. Grandville Lax   Foley catheter in place    Gout    per pt stable is of 08-18-2014   History of acute bronchitis    recently 07-16-2014   History of sepsis    urosepsis--  08-13-2014 admission   Hyperlipidemia LDL goal <100    Hypertension    Multilevel degenerative disc disease    cervical and lumbar   OSA (obstructive sleep apnea)        Psoriasis    S/P drug eluting coronary stent placement    05-13-2008   DES X1 TO pLAD   Sigmoid diverticulosis     Family History  Problem Relation Age of Onset   Hypertension Father    Stroke Father 26   Lung cancer Mother          smoker   Aneurysm Mother    Diabetes Sister    Heart disease Brother 43        stent LAD 2012   Colon cancer Neg Hx    Pancreatic cancer Neg Hx    Rectal cancer Neg Hx    Stomach cancer Neg Hx     Past Surgical History:  Procedure Laterality Date   BUNIONECTOMY Right 2000   COLONOSCOPY WITH PROPOFOL   last one 10-06-2013   CORONARY ANGIOPLASTY WITH STENT PLACEMENT  05-13-2008    Dr Lavina Pou   PCI w/  DES x1 to  pLAD/  ef 65%   GREEN LIGHT LASER TURP (TRANSURETHRAL RESECTION OF PROSTATE N/A 08/23/2014   Procedure: GREEN LIGHT LASER TURP (TRANSURETHRAL RESECTION OF PROSTATE;  Surgeon: Homero Luster, MD;  Location: [facility];  Service: Urology;  Laterality:  N/A;   WRIST GANGLION EXCISION Left 2001   Social History   Occupational History   Occupation: Naval architect  Tobacco Use   Smoking status: Former    Current packs/day: 0.00    Average packs/day: 0.8 packs/day for 14.0 years (10.5 ttl pk-yrs)    Types: Cigarettes    Start date: 05/28/1983    Quit date: 05/27/1997    Years since quitting: 26.4   Smokeless tobacco: Former    Types: Snuff    Quit date: 08/17/1997  Vaping Use   Vaping status: Never Used  Substance and Sexual Activity   Alcohol  use: Yes    Comment: occasional   Drug use: No   Sexual activity: Not on file

## 2023-10-24 ENCOUNTER — Ambulatory Visit: Payer: Self-pay | Admitting: Family Medicine

## 2023-10-24 ENCOUNTER — Encounter: Payer: Self-pay | Admitting: Family Medicine

## 2023-10-24 ENCOUNTER — Encounter: Payer: Self-pay | Admitting: Orthopedic Surgery

## 2023-10-24 LAB — QUANTIFERON-TB GOLD PLUS
Mitogen-NIL: >10 [IU]/mL
NIL: 0.02 [IU]/mL
QuantiFERON-TB Gold Plus: NEGATIVE
TB1-NIL: 0.01 [IU]/mL
TB2-NIL: 0.01 [IU]/mL

## 2023-10-26 NOTE — Telephone Encounter (Signed)
 Oow for 6 weeks after surgery

## 2023-10-27 ENCOUNTER — Other Ambulatory Visit (HOSPITAL_BASED_OUTPATIENT_CLINIC_OR_DEPARTMENT_OTHER): Payer: Self-pay

## 2023-10-27 ENCOUNTER — Other Ambulatory Visit: Payer: Self-pay | Admitting: Surgical

## 2023-10-27 ENCOUNTER — Telehealth: Payer: Self-pay | Admitting: Orthopedic Surgery

## 2023-10-27 ENCOUNTER — Encounter: Payer: Self-pay | Admitting: Orthopedic Surgery

## 2023-10-27 DIAGNOSIS — X58XXXA Exposure to other specified factors, initial encounter: Secondary | ICD-10-CM | POA: Diagnosis not present

## 2023-10-27 DIAGNOSIS — S83241A Other tear of medial meniscus, current injury, right knee, initial encounter: Secondary | ICD-10-CM | POA: Diagnosis not present

## 2023-10-27 DIAGNOSIS — M94261 Chondromalacia, right knee: Secondary | ICD-10-CM | POA: Diagnosis not present

## 2023-10-27 DIAGNOSIS — Y999 Unspecified external cause status: Secondary | ICD-10-CM | POA: Diagnosis not present

## 2023-10-27 DIAGNOSIS — G8918 Other acute postprocedural pain: Secondary | ICD-10-CM | POA: Diagnosis not present

## 2023-10-27 DIAGNOSIS — S83231A Complex tear of medial meniscus, current injury, right knee, initial encounter: Secondary | ICD-10-CM | POA: Diagnosis not present

## 2023-10-27 DIAGNOSIS — S83281A Other tear of lateral meniscus, current injury, right knee, initial encounter: Secondary | ICD-10-CM | POA: Diagnosis not present

## 2023-10-27 MED ORDER — OXYCODONE HCL 5 MG PO TABS
5.0000 mg | ORAL_TABLET | ORAL | 0 refills | Status: DC | PRN
  Filled 2023-10-27: qty 30, 5d supply, fill #0

## 2023-10-27 MED ORDER — OXYCODONE HCL 5 MG PO TABS: 5.0000 mg | ORAL_TABLET | ORAL | 0 refills | Status: AC | PRN

## 2023-10-27 MED ORDER — CELECOXIB 100 MG PO CAPS
100.0000 mg | ORAL_CAPSULE | Freq: Two times a day (BID) | ORAL | 0 refills | Status: DC
  Filled 2023-10-27: qty 60, 30d supply, fill #0

## 2023-10-27 MED ORDER — METHOCARBAMOL 500 MG PO TABS
500.0000 mg | ORAL_TABLET | Freq: Three times a day (TID) | ORAL | 1 refills | Status: DC | PRN
  Filled 2023-10-27: qty 30, 10d supply, fill #0

## 2023-10-27 MED ORDER — CELECOXIB 100 MG PO CAPS: 100.0000 mg | ORAL_CAPSULE | Freq: Two times a day (BID) | ORAL | 0 refills | Status: AC

## 2023-10-27 MED ORDER — ASPIRIN 81 MG PO CHEW: 81.0000 mg | CHEWABLE_TABLET | Freq: Two times a day (BID) | ORAL | 0 refills | Status: AC

## 2023-10-27 MED ORDER — METHOCARBAMOL 500 MG PO TABS: 500.0000 mg | ORAL_TABLET | Freq: Three times a day (TID) | ORAL | 1 refills | Status: AC | PRN

## 2023-10-27 MED ORDER — ASPIRIN EC 81 MG PO TBEC
81.0000 mg | DELAYED_RELEASE_TABLET | Freq: Two times a day (BID) | ORAL | 0 refills | Status: DC
  Filled 2023-10-27: qty 120, 60d supply, fill #0

## 2023-10-27 NOTE — Telephone Encounter (Signed)
 Sent in

## 2023-10-27 NOTE — Telephone Encounter (Signed)
 Pt called stating had surgery and was discharged from hospital Pt states all his medications was sent to wrong pharmacy. Please send all his medications to [facility] [address] Pt phone number is [phone].

## 2023-10-28 DIAGNOSIS — G4733 Obstructive sleep apnea (adult) (pediatric): Secondary | ICD-10-CM | POA: Diagnosis not present

## 2023-10-28 NOTE — Telephone Encounter (Signed)
 Got it, that's okay. Thank you

## 2023-10-29 ENCOUNTER — Encounter: Payer: Self-pay | Admitting: Orthopedic Surgery

## 2023-11-04 ENCOUNTER — Other Ambulatory Visit: Payer: Self-pay | Admitting: Family Medicine

## 2023-11-04 DIAGNOSIS — I10 Essential (primary) hypertension: Secondary | ICD-10-CM

## 2023-11-05 ENCOUNTER — Ambulatory Visit (INDEPENDENT_AMBULATORY_CARE_PROVIDER_SITE_OTHER): Admitting: Orthopedic Surgery

## 2023-11-05 ENCOUNTER — Encounter: Payer: Self-pay | Admitting: Orthopedic Surgery

## 2023-11-05 DIAGNOSIS — S83241D Other tear of medial meniscus, current injury, right knee, subsequent encounter: Secondary | ICD-10-CM

## 2023-11-05 NOTE — Progress Notes (Signed)
 Post-Op Visit Note   Patient: Charles [name]           Date of Birth: 1960/11/21           MRN: [id number] Visit Date: 11/05/2023 PCP: Kaylee Partridge, MD   Assessment & Plan:  Chief Complaint:  Chief Complaint  Patient presents with   Right Knee - Follow-up    Right knee scope 10/27/2023   Visit Diagnoses:  1. Acute medial meniscus tear of right knee, subsequent encounter     Plan: Patient comes in about a week out from right knee arthroscopy and partial medial meniscectomy.  Overall he has been doing well..  On exam his portal incisions are intact.  Sutures removed.  Mild effusion present.  No calf tenderness negative Homans.  Quad strength is actually pretty reasonable.  Plan at this time is to start an exercise program focusing on stationary bike and home gym leg curl and leg extension.  Will follow-up in 4 weeks for clinical recheck.  We actually remove the sutures and when he went to lunch had a little bit of bleeding from that medial portal.  Oral is inspected.  Has a little bit of skin gapping about 1 mm.  This area was thoroughly cleaned with alcohol  and Dermabond and Steri-Strips applied.  Also aspirated right knee of about 60 cc of bloody fluid typical for post meniscectomy effusion.  Wanted to decrease any internal pressure while this portal incision heals.  Will delay his activity regimen until Sunday.  Okay to go back on 1 baby aspirin  a day which is what he was taking prior to this surgery. Follow-Up Instructions: Return in about 4 weeks (around 12/03/2023) for [facility].   Orders:  No orders of the defined types were placed in this encounter.  No orders of the defined types were placed in this encounter.   Imaging: No results found.  PMFS History: Patient Active Problem List   Diagnosis Date Noted   OA (osteoarthritis) of knee 09/19/2021   Diabetes mellitus, type 2 (HCC) 06/28/2019   Sepsis secondary to UTI (HCC) 11/17/2017   UTI (urinary tract infection)  11/17/2017   Left calcaneal bursitis 12/14/2014   Pronation deformity of ankle, acquired 10/26/2014   Allergic reaction 10/14/2014   Achilles tendinosis 10/05/2014   OSA (obstructive sleep apnea) 08/18/2014   Gout 08/14/2014   Obesity 06/02/2013   Abnormal MRI, spine 05/09/2013   H/O urinary retention 05/09/2013   Psoriasis 05/08/2012   Coronary artery disease 08/03/2008   Hyperlipidemia 11/10/2007   History of gout 11/10/2007   Essential hypertension 11/10/2007   G E R D 01/06/2007   Past Medical History:  Diagnosis Date   BPH (benign prostatic hypertrophy) with urinary retention    CAD (coronary artery disease) 04/2008   successful PCI of the lesion in the proximal LAD using xience drug-eluting stent with improvement in central narrowing from 95% to 0%.  Dr. Grandville Lax   Foley catheter in place    Gout    per pt stable is of 08-18-2014   History of acute bronchitis    recently 07-16-2014   History of sepsis    urosepsis--  08-13-2014 admission   Hyperlipidemia LDL goal <100    Hypertension    Multilevel degenerative disc disease    cervical and lumbar   OSA (obstructive sleep apnea)        Psoriasis    S/P drug eluting coronary stent placement    05-13-2008   DES X1 TO  pLAD   Sigmoid diverticulosis     Family History  Problem Relation Age of Onset   Hypertension Father    Stroke Father 71   Lung cancer Mother         smoker   Aneurysm Mother    Diabetes Sister    Heart disease Brother 37        stent LAD 2012   Colon cancer Neg Hx    Pancreatic cancer Neg Hx    Rectal cancer Neg Hx    Stomach cancer Neg Hx     Past Surgical History:  Procedure Laterality Date   BUNIONECTOMY Right 2000   COLONOSCOPY WITH PROPOFOL   last one 10-06-2013   CORONARY ANGIOPLASTY WITH STENT PLACEMENT  05-13-2008    Dr Lavina Pou   PCI w/  DES x1 to  pLAD/  ef 65%   GREEN LIGHT LASER TURP (TRANSURETHRAL RESECTION OF PROSTATE N/A 08/23/2014   Procedure: GREEN LIGHT LASER TURP  (TRANSURETHRAL RESECTION OF PROSTATE;  Surgeon: Homero Luster, MD;  Location: [facility];  Service: Urology;  Laterality: N/A;   WRIST GANGLION EXCISION Left 2001   Social History   Occupational History   Occupation: Naval architect  Tobacco Use   Smoking status: Former    Current packs/day: 0.00    Average packs/day: 0.8 packs/day for 14.0 years (10.5 ttl pk-yrs)    Types: Cigarettes    Start date: 05/28/1983    Quit date: 05/27/1997    Years since quitting: 26.4   Smokeless tobacco: Former    Types: Snuff    Quit date: 08/17/1997  Vaping Use   Vaping status: Never Used  Substance and Sexual Activity   Alcohol  use: Yes    Comment: occasional   Drug use: No   Sexual activity: Not on file

## 2023-11-26 DIAGNOSIS — G4733 Obstructive sleep apnea (adult) (pediatric): Secondary | ICD-10-CM | POA: Diagnosis not present

## 2023-12-01 DIAGNOSIS — G4733 Obstructive sleep apnea (adult) (pediatric): Secondary | ICD-10-CM | POA: Diagnosis not present

## 2023-12-05 ENCOUNTER — Ambulatory Visit (INDEPENDENT_AMBULATORY_CARE_PROVIDER_SITE_OTHER): Admitting: Orthopedic Surgery

## 2023-12-05 ENCOUNTER — Encounter: Payer: Self-pay | Admitting: Orthopedic Surgery

## 2023-12-05 DIAGNOSIS — S83241D Other tear of medial meniscus, current injury, right knee, subsequent encounter: Secondary | ICD-10-CM

## 2023-12-05 NOTE — Progress Notes (Signed)
 Post-Op Visit Note   Patient: Charles [name]           Date of Birth: 12/09/1960           MRN: [id number] Visit Date: 12/05/2023 PCP: Watt Harlene BROCKS, MD   Assessment & Plan:  Chief Complaint:  Chief Complaint  Patient presents with   Right Knee - Routine Post Op    10/27/23 right knee arthroscopy and partial medial meniscectomy.     Visit Diagnoses:  1. Acute medial meniscus tear of right knee, subsequent encounter     Plan: Follow-up as the patient underwent right knee arthroscopy with partial medial meniscectomy 2 months ago.  Doing some better.  Direct pressure on the knee hurts like when he is on his knee.  On exam he does have a trace effusion but full range of motion.  He is doing a home exercise program involving leg extension leg press as well as biking.  He has had gel injections twice on the left knee which has end-stage arthritis.  Planning to return to work on 714.  In his return to work duties he does have to carry 75 pounds at times as well as lift a 50 pound battery.  Plan at this time is okay to return to work on 714 but I think he may need assistance with ladders and carrying more than 50 pounds for the first 4 weeks back.  Will see how he does with these restrictions.  Follow-up with us  as needed.  We may need to aspirate that knee if he has worse swelling.  I think taking anti-inflammatories could be helpful.  He may need to get that knee replaced at sometime in the future but it is possible once his right knee fully heals that he could delay his need for left knee replacement for a while longer during his working time.  Follow-Up Instructions: No follow-ups on file.   Orders:  No orders of the defined types were placed in this encounter.  No orders of the defined types were placed in this encounter.   Imaging: No results found.  PMFS History: Patient Active Problem List   Diagnosis Date Noted   OA (osteoarthritis) of knee 09/19/2021   Diabetes  mellitus, type 2 (HCC) 06/28/2019   Sepsis secondary to UTI (HCC) 11/17/2017   UTI (urinary tract infection) 11/17/2017   Left calcaneal bursitis 12/14/2014   Pronation deformity of ankle, acquired 10/26/2014   Allergic reaction 10/14/2014   Achilles tendinosis 10/05/2014   OSA (obstructive sleep apnea) 08/18/2014   Gout 08/14/2014   Obesity 06/02/2013   Abnormal MRI, spine 05/09/2013   H/O urinary retention 05/09/2013   Psoriasis 05/08/2012   Coronary artery disease 08/03/2008   Hyperlipidemia 11/10/2007   History of gout 11/10/2007   Essential hypertension 11/10/2007   G E R D 01/06/2007   Past Medical History:  Diagnosis Date   BPH (benign prostatic hypertrophy) with urinary retention    CAD (coronary artery disease) 04/2008   successful PCI of the lesion in the proximal LAD using xience drug-eluting stent with improvement in central narrowing from 95% to 0%.  Dr. 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   OSA (obstructive sleep apnea)        Psoriasis    S/P drug eluting coronary stent placement    05-13-2008   DES X1 TO pLAD   Sigmoid diverticulosis     Family History  Problem Relation Age of Onset   Hypertension Father    Stroke Father 87   Lung cancer Mother         smoker   Aneurysm Mother    Diabetes Sister    Heart disease Brother 34        stent LAD 2012   Colon cancer Neg Hx    Pancreatic cancer Neg Hx    Rectal cancer Neg Hx    Stomach cancer Neg Hx     Past Surgical History:  Procedure Laterality Date   BUNIONECTOMY Right 2000   COLONOSCOPY WITH PROPOFOL   last one 10-06-2013   CORONARY ANGIOPLASTY WITH STENT PLACEMENT  05-13-2008    Dr Wolm Nida   PCI w/  DES x1 to  pLAD/  ef 65%   GREEN  LIGHT LASER TURP (TRANSURETHRAL RESECTION OF PROSTATE N/A 08/23/2014   Procedure: GREEN LIGHT LASER TURP (TRANSURETHRAL RESECTION OF PROSTATE;  Surgeon: Norleen Seltzer, MD;  Location: [facility];  Service: Urology;  Laterality: N/A;   WRIST GANGLION EXCISION Left 2001   Social History   Occupational History   Occupation: Naval architect  Tobacco Use   Smoking status: Former    Current packs/day: 0.00    Average packs/day: 0.8 packs/day for 14.0 years (10.5 ttl pk-yrs)    Types: Cigarettes    Start date: 05/28/1983    Quit date: 05/27/1997    Years since quitting: 26.5   Smokeless tobacco: Former    Types: Snuff    Quit date: 08/17/1997  Vaping Use   Vaping status: Never Used  Substance and Sexual Activity   Alcohol  use: Yes    Comment: occasional   Drug use: No   Sexual activity: Not on file

## 2023-12-07 ENCOUNTER — Encounter: Payer: Self-pay | Admitting: Family Medicine

## 2023-12-07 DIAGNOSIS — G629 Polyneuropathy, unspecified: Secondary | ICD-10-CM

## 2023-12-08 ENCOUNTER — Other Ambulatory Visit (HOSPITAL_BASED_OUTPATIENT_CLINIC_OR_DEPARTMENT_OTHER): Payer: Self-pay

## 2023-12-08 MED ORDER — PREGABALIN 300 MG PO CAPS
300.0000 mg | ORAL_CAPSULE | Freq: Two times a day (BID) | ORAL | 3 refills | Status: DC
  Filled 2023-12-08: qty 60, 30d supply, fill #0

## 2023-12-08 MED ORDER — PREGABALIN 300 MG PO CAPS: 300.0000 mg | ORAL_CAPSULE | Freq: Two times a day (BID) | ORAL | 3 refills | Status: DC

## 2023-12-08 NOTE — Addendum Note (Signed)
 Addended by: WATT RAISIN C on: 12/08/2023 01:29 PM   Modules accepted: Orders

## 2023-12-16 ENCOUNTER — Ambulatory Visit: Admitting: Internal Medicine

## 2023-12-19 MED ORDER — PREGABALIN 300 MG PO CAPS: 300.0000 mg | ORAL_CAPSULE | Freq: Two times a day (BID) | ORAL | 0 refills | Status: AC

## 2023-12-19 NOTE — Addendum Note (Signed)
 Addended by: WATT RAISIN C on: 12/19/2023 11:57 AM   Modules accepted: Orders

## 2023-12-27 ENCOUNTER — Encounter: Payer: Self-pay | Admitting: Internal Medicine

## 2023-12-29 NOTE — Progress Notes (Unsigned)
 @Patient  ID: Charles [name], male    DOB: 1961/05/26, 63 y.o.   MRN: [id number]  Chief Complaint  Patient presents with   Follow-up    Referring provider: Philemon Cordella BIRCH, FNP  HPI:  Male former smoker followed for severe sleep apnea on C Pap at bedtime HST 02/07/15 >> AHI 53.2, SaO2 low 75%.  ================================================================================   10/17/22- 61 yoM former smoker re-establishing for OSA. Medical complications include HTN, CAD, GERD, DM2,, Psoriasis, BPH,   CPAP auto 5-15/ [employer]   02/22/15            Has [employer] travel CPAP Download compliance-81%, AHI 1.3/ hr Body weight today- ----Needs new cpap machine was  with [employer]. Motor life exceeded. He has benefited from CPAP with good compliance.  Download reviewed. Medical purpose and usage goals reviewed. He denies recent cardiac issues.  01/01/24- 63 yoM former smoker followed for OSA, complicated by  HTN, CAD, GERD, DM2, Psoriasis, BPH,   CPAP auto 5-15/ [facility]   replaced 10/17/22           Has [employer] travel CPAP Download compliance-90%, AHI 1/hr Body weight today-253 lbs Using CPAP nightly.  Doing well.  Sleep is good. Discussed the use of AI scribe software for clinical note transcription with the patient, who gave verbal consent to proceed.  History of Present Illness   Charles [name] is a 63 year old male with obstructive sleep apnea who presents for a follow-up regarding his CPAP therapy and sinus issues.  He sleeps well using a CPAP machine obtained last year, with approximately one breakthrough apnea per hour. He also uses a travel CPAP machine for about ten years but is concerned about its longevity and has not replaced its components.  He experiences symptoms consistent with a sinus infection, including headaches, nasal secretions, and malaise. He typically responds well to amoxicillin  or Augmentin  due to an allergy to sulfa drugs. He is currently experiencing these  symptoms and feels discomfort.  He recently moved to [place] and has not yet established care with a local primary care physician. He frequently travels for work, involving long drives, including driving from [place] for this appointment and planning to drive to [place] afterward.     Assessment and Plan:    Acute sinusitis Symptoms include headache, nasal secretions, and malaise. Allergic to sulfa drugs but tolerates other antibiotics well. - Prescribe Augmentin  (amoxicillin /clavulanate). - Send prescription to [facility] pharmacy in [place].  Obstructive sleep apnea Well-managed with CPAP set between 5-15 cm H2O, centering around 12 cm H2O, resulting in one breakthrough apnea per hour. Comfortable with current CPAP machine. - Continue current CPAP settings. - Replace CPAP components if visibly worn or damaged.  also ok to purchase replacement components from [employer], [employer], or a home care company if needed.     ROS-see HPI   + = positive Constitutional:    weight loss, night sweats, fevers, chills, fatigue, lassitude. HEENT:    headaches, difficulty swallowing, tooth/dental problems, sore throat,       sneezing, itching, ear ache, nasal congestion, post nasal drip, snoring CV:    chest pain, orthopnea, PND, swelling in lower extremities, anasarca,                                   dizziness, palpitations Resp:   shortness of breath with exertion or at rest.  productive cough,   non-productive cough, coughing up of blood.              change in color of mucus.  wheezing.   Skin:    rash or lesions. GI:  No-   heartburn, indigestion, abdominal pain, nausea, vomiting, diarrhea,                 change in bowel habits, loss of appetite GU: dysuria, change in color of urine, no urgency or frequency.   flank pain. MS:   joint pain, stiffness, decreased range of motion, back pain. Neuro-     nothing unusual Psych:  change in mood or affect.  depression or anxiety.    memory loss.  OBJ- Physical Exam General- Alert, Oriented, Affect-appropriate, Distress- none acute, +obese Skin- rash-none, lesions- none, excoriation- none Lymphadenopathy- none Head- atraumatic            Eyes- Gross vision intact, PERRLA, conjunctivae and secretions clear            Ears- Hearing, canals-normal            Nose- Clear, no-Septal dev, mucus, polyps, erosion, perforation             Throat- Mallampati IV , mucosa clear , drainage- none, tonsils- atrophic Neck- flexible , trachea midline, no stridor , thyroid  nl, carotid no bruit Chest - symmetrical excursion , unlabored           Heart/CV- RRR , no murmur , no gallop  , no rub, nl s1 s2                           - JVD- none , edema- none, stasis changes- none, varices- none           Lung- clear to P&A, wheeze- none, cough- none , dullness-none, rub- none           Chest wall-  Abd-  Br/ Gen/ Rectal- Not done, not indicated Extrem- cyanosis- none, clubbing, none, atrophy- none, strength- nl Neuro- grossly intact to observation

## 2024-01-01 ENCOUNTER — Encounter: Payer: Self-pay | Admitting: Internal Medicine

## 2024-01-01 ENCOUNTER — Ambulatory Visit (INDEPENDENT_AMBULATORY_CARE_PROVIDER_SITE_OTHER): Admitting: Internal Medicine

## 2024-01-01 VITALS — BP 126/68 | HR 88 | Temp 97.8°F | Ht 68.0 in | Wt 253.2 lb

## 2024-01-01 DIAGNOSIS — G4733 Obstructive sleep apnea (adult) (pediatric): Secondary | ICD-10-CM | POA: Diagnosis not present

## 2024-01-01 MED ORDER — AMOXICILLIN-POT CLAVULANATE 875-125 MG PO TABS: 1.0000 | ORAL_TABLET | Freq: Two times a day (BID) | ORAL | 0 refills | Status: DC

## 2024-01-01 NOTE — Patient Instructions (Signed)
 Ok to continue CPAP auto 5-15- glad you are doing well.  Script sent for augmentin  to help for sinus infection

## 2024-01-04 ENCOUNTER — Encounter: Payer: Self-pay | Admitting: Family Medicine

## 2024-01-05 MED ORDER — MOUNJARO 15 MG/0.5ML ~~LOC~~ SOAJ: 15.0000 mg | SUBCUTANEOUS | 1 refills | Status: DC

## 2024-01-06 ENCOUNTER — Encounter: Payer: Self-pay | Admitting: Family Medicine

## 2024-01-06 MED ORDER — PREDNISONE 20 MG PO TABS
ORAL_TABLET | ORAL | 0 refills | Status: DC
  Filled 2024-01-06: qty 9, 6d supply, fill #0

## 2024-01-07 ENCOUNTER — Other Ambulatory Visit (HOSPITAL_COMMUNITY): Payer: Self-pay

## 2024-01-07 ENCOUNTER — Telehealth: Payer: Self-pay

## 2024-01-07 ENCOUNTER — Other Ambulatory Visit (HOSPITAL_BASED_OUTPATIENT_CLINIC_OR_DEPARTMENT_OTHER): Payer: Self-pay

## 2024-01-07 MED ORDER — PREDNISONE 20 MG PO TABS: ORAL_TABLET | ORAL | 0 refills | Status: AC

## 2024-01-07 NOTE — Addendum Note (Signed)
 Addended by: DORLENE CHIQUITA RAMAN on: 01/07/2024 08:54 AM   Modules accepted: Orders

## 2024-01-07 NOTE — Telephone Encounter (Signed)
 Pharmacy Patient Advocate Encounter  Received notification from [employer] that Prior Authorization for Mounjaro  15 has been APPROVED from 01/07/24 to 05/26/24. Ran test claim, Copay is $141.00 for an 84 day supply. This test claim was processed through [facility]- copay amounts may vary at other pharmacies due to pharmacy/plan contracts, or as the patient moves through the different stages of their insurance plan.

## 2024-01-07 NOTE — Telephone Encounter (Signed)
 Pharmacy Patient Advocate Encounter   Received notification from CoverMyMeds that prior authorization for Mounjaro  15 is required/requested.   Insurance verification completed.   The patient is insured through [facility] .   Per test claim: PA required; PA submitted to above mentioned insurance via Latent Key/confirmation #/EOC [id number] Status is pending

## 2024-01-31 ENCOUNTER — Other Ambulatory Visit: Payer: Self-pay | Admitting: Family Medicine

## 2024-02-02 ENCOUNTER — Encounter: Payer: Self-pay | Admitting: Orthopedic Surgery

## 2024-02-09 ENCOUNTER — Ambulatory Visit: Admitting: Physician Assistant

## 2024-03-14 ENCOUNTER — Encounter: Payer: Self-pay | Admitting: Orthopedic Surgery

## 2024-03-14 DIAGNOSIS — M1712 Unilateral primary osteoarthritis, left knee: Secondary | ICD-10-CM

## 2024-03-15 NOTE — Telephone Encounter (Signed)
 Please get preapproval for gel injection for the affected knee.  Thanks

## 2024-03-24 ENCOUNTER — Other Ambulatory Visit: Payer: Self-pay

## 2024-03-24 DIAGNOSIS — M1712 Unilateral primary osteoarthritis, left knee: Secondary | ICD-10-CM

## 2024-03-24 NOTE — Telephone Encounter (Signed)
 Talked with patient and appt.has been scheduled , but would like euflexxa injection.  VOB submitted

## 2024-03-29 ENCOUNTER — Encounter: Payer: Self-pay | Admitting: Radiology

## 2024-04-06 ENCOUNTER — Other Ambulatory Visit: Payer: Self-pay | Admitting: Family Medicine

## 2024-04-13 ENCOUNTER — Encounter: Payer: Self-pay | Admitting: Family Medicine

## 2024-04-13 DIAGNOSIS — E782 Mixed hyperlipidemia: Secondary | ICD-10-CM

## 2024-04-13 DIAGNOSIS — Z8739 Personal history of other diseases of the musculoskeletal system and connective tissue: Secondary | ICD-10-CM

## 2024-04-13 DIAGNOSIS — I10 Essential (primary) hypertension: Secondary | ICD-10-CM

## 2024-04-13 DIAGNOSIS — I2510 Atherosclerotic heart disease of native coronary artery without angina pectoris: Secondary | ICD-10-CM

## 2024-04-14 ENCOUNTER — Ambulatory Visit: Admitting: Surgical

## 2024-04-14 ENCOUNTER — Encounter: Payer: Self-pay | Admitting: Surgical

## 2024-04-14 DIAGNOSIS — M1712 Unilateral primary osteoarthritis, left knee: Secondary | ICD-10-CM | POA: Diagnosis not present

## 2024-04-14 MED ORDER — LIDOCAINE HCL 1 % IJ SOLN
5.0000 mL | INTRAMUSCULAR | Status: AC | PRN
  Administered 2024-04-14: 5 mL

## 2024-04-14 MED ORDER — ROSUVASTATIN CALCIUM 20 MG PO TABS: 20.0000 mg | ORAL_TABLET | Freq: Every day | ORAL | 3 refills | Status: AC

## 2024-04-14 MED ORDER — ALLOPURINOL 300 MG PO TABS: ORAL_TABLET | ORAL | 3 refills | Status: AC

## 2024-04-14 MED ORDER — FLUOXETINE HCL 20 MG PO CAPS: 20.0000 mg | ORAL_CAPSULE | Freq: Every day | ORAL | 3 refills | Status: AC

## 2024-04-14 MED ORDER — METOPROLOL SUCCINATE ER 100 MG PO TB24: 100.0000 mg | ORAL_TABLET | Freq: Every day | ORAL | 3 refills | Status: AC

## 2024-04-14 MED ORDER — SODIUM HYALURONATE (VISCOSUP) 20 MG/2ML IX SOSY
20.0000 mg | PREFILLED_SYRINGE | INTRA_ARTICULAR | Status: AC | PRN
  Administered 2024-04-14: 20 mg via INTRA_ARTICULAR

## 2024-04-14 MED ORDER — METFORMIN HCL 500 MG PO TABS: 500.0000 mg | ORAL_TABLET | Freq: Every day | ORAL | 0 refills | Status: DC

## 2024-04-14 MED ORDER — MOUNJARO 15 MG/0.5ML ~~LOC~~ SOAJ: 15.0000 mg | SUBCUTANEOUS | 1 refills | Status: AC

## 2024-04-14 MED ORDER — NITROGLYCERIN 0.3 MG SL SUBL: 0.3000 mg | SUBLINGUAL_TABLET | SUBLINGUAL | 2 refills | Status: AC | PRN

## 2024-04-14 NOTE — Progress Notes (Signed)
   Procedure Note  Patient: Charles [name]             Date of Birth: [birth year]           MRN: [id number]             Visit Date: 04/14/2024  Procedures: Visit Diagnoses: No diagnosis found.  Large Joint Inj: L knee on 04/14/2024 1:14 PM Indications: diagnostic evaluation, joint swelling and pain Details: 18 G 1.5 in needle, superolateral approach  Arthrogram: No  Medications: 5 mL lidocaine  1 %; 20 mg Sodium Hyaluronate (Viscosup) 20 MG/2ML Outcome: tolerated well, no immediate complications Procedure, treatment alternatives, risks and benefits explained, specific risks discussed. Consent was given by the patient. Immediately prior to procedure a time out was called to verify the correct patient, procedure, equipment, support staff and site/side marked as required. Patient was prepped and draped in the usual sterile fashion.

## 2024-04-19 ENCOUNTER — Encounter: Payer: Self-pay | Admitting: Surgical

## 2024-04-19 ENCOUNTER — Ambulatory Visit: Admitting: Surgical

## 2024-04-19 DIAGNOSIS — M1712 Unilateral primary osteoarthritis, left knee: Secondary | ICD-10-CM

## 2024-04-19 MED ORDER — LIDOCAINE HCL 1 % IJ SOLN
5.0000 mL | INTRAMUSCULAR | Status: AC | PRN
  Administered 2024-04-19: 5 mL

## 2024-04-19 MED ORDER — SODIUM HYALURONATE (VISCOSUP) 20 MG/2ML IX SOSY
20.0000 mg | PREFILLED_SYRINGE | INTRA_ARTICULAR | Status: AC | PRN
  Administered 2024-04-19: 20 mg via INTRA_ARTICULAR

## 2024-04-19 NOTE — Progress Notes (Signed)
   Procedure Note  Patient: Charles [name]             Date of Birth: [birth year]           MRN: [id number]             Visit Date: 04/19/2024  Procedures: Visit Diagnoses: No diagnosis found.  Large Joint Inj: L knee on 04/19/2024 2:05 PM Indications: diagnostic evaluation, joint swelling and pain Details: 18 G 1.5 in needle, superolateral approach  Arthrogram: No  Medications: 5 mL lidocaine  1 %; 20 mg Sodium Hyaluronate (Viscosup) 20 MG/2ML Outcome: tolerated well, no immediate complications Procedure, treatment alternatives, risks and benefits explained, specific risks discussed. Consent was given by the patient. Immediately prior to procedure a time out was called to verify the correct patient, procedure, equipment, support staff and site/side marked as required. Patient was prepped and draped in the usual sterile fashion.

## 2024-04-26 ENCOUNTER — Encounter: Payer: Self-pay | Admitting: Surgical

## 2024-04-26 ENCOUNTER — Ambulatory Visit: Admitting: Surgical

## 2024-04-26 DIAGNOSIS — M1712 Unilateral primary osteoarthritis, left knee: Secondary | ICD-10-CM | POA: Diagnosis not present

## 2024-04-26 MED ORDER — SODIUM HYALURONATE (VISCOSUP) 20 MG/2ML IX SOSY
20.0000 mg | PREFILLED_SYRINGE | INTRA_ARTICULAR | Status: AC | PRN
  Administered 2024-04-26: 20 mg via INTRA_ARTICULAR

## 2024-04-26 MED ORDER — LIDOCAINE HCL 1 % IJ SOLN
5.0000 mL | INTRAMUSCULAR | Status: AC | PRN
  Administered 2024-04-26: 5 mL

## 2024-04-26 NOTE — Progress Notes (Signed)
   Procedure Note  Patient: Charles [name]             Date of Birth: 1961-02-04           MRN: [id number]             Visit Date: 04/26/2024  Procedures: Visit Diagnoses: No diagnosis found.  Large Joint Inj: L knee on 04/26/2024 1:14 PM Indications: diagnostic evaluation, joint swelling and pain Details: 18 G 1.5 in needle, superolateral approach  Arthrogram: No  Medications: 5 mL lidocaine  1 %; 20 mg Sodium Hyaluronate (Viscosup) 20 MG/2ML Outcome: tolerated well, no immediate complications Procedure, treatment alternatives, risks and benefits explained, specific risks discussed. Consent was given by the patient. Immediately prior to procedure a time out was called to verify the correct patient, procedure, equipment, support staff and site/side marked as required. Patient was prepped and draped in the usual sterile fashion.    Patient here for left knee gel injection.  This is the last in series of 3 injections.  He also wants to proceed with left knee replacement to be done in or around September 2026.  Surgery scheduler's card provided to the patient and he will call to set this up after he looks at his schedule.  Also recommended that he make an appointment for about 4 to 6 weeks prior to surgery so that we can once again discuss the risks and benefits of the surgery as well as the surgical process and recovery timeframe/rehab.

## 2024-04-30 ENCOUNTER — Telehealth: Payer: Self-pay

## 2024-04-30 NOTE — Telephone Encounter (Signed)
 Attempted to reach patient concerning colon recall; unable to speak with patient; left message and number to the office for patient to call back and schedule PV and procedure appt;

## 2024-05-07 ENCOUNTER — Encounter: Payer: Self-pay | Admitting: Family Medicine

## 2024-05-17 ENCOUNTER — Telehealth: Payer: Self-pay

## 2024-05-17 ENCOUNTER — Other Ambulatory Visit (HOSPITAL_COMMUNITY): Payer: Self-pay

## 2024-05-17 ENCOUNTER — Encounter: Payer: Self-pay | Admitting: Family Medicine

## 2024-05-17 NOTE — Telephone Encounter (Signed)
 Pharmacy Patient Advocate Encounter   Received notification from On[employer] that prior authorization for Mounjaro  15 is required/requested.   Insurance verification completed.   The patient is insured through [facility].   Per test claim: PA required; PA submitted to above mentioned insurance via Latent Key/confirmation #/EOC [id number] Status is pending

## 2024-05-17 NOTE — Telephone Encounter (Signed)
 Pharmacy Patient Advocate Encounter  Received notification from [employer] that Prior Authorization for Mounjaro  15 has been APPROVED from 05/13/24 to 05/26/25. Unable to obtain price due to refill too soon rejection, last fill date 04/19/24 next available fill date1/26/25   PA #/Case ID/Reference #: # [id number]

## 2024-05-19 NOTE — Telephone Encounter (Signed)
 Per documentation- it appears patient had transferred care to a provider in [place], [place]

## 2024-05-23 ENCOUNTER — Other Ambulatory Visit: Payer: Self-pay | Admitting: Family Medicine

## 2024-06-13 ENCOUNTER — Encounter: Payer: Self-pay | Admitting: Family Medicine

## 2024-06-22 NOTE — Patient Instructions (Incomplete)
 It was good to see you today, I will be in touch with your blood work Assuming all is well please see me in about 6 months Please let me know if your sinus infection does not improve over the next few days Sooner if worse.

## 2024-06-22 NOTE — Progress Notes (Unsigned)
 [facility] [address], [address] [place], [place] [address] [phone]  Date:  06/23/2024   Name:  Charles [name]   DOB:  [birth year]   MRN:  [id number]  PCP:  Watt Harlene BROCKS, MD    Chief Complaint: No chief complaint on file.   History of Present Illness:  Charles [name] is a 64 y.o. very pleasant male patient who presents with the following:  Patient seen today for periodic follow-up.  I saw him most recently in May History of hypertension, CAD,diabetes, plaque psoriasis He works in public affairs consultant; physically demanding job-  When I saw him in May he had been out of work for couple of months without knee injury and was planning to see orthopedics-likely would need surgery He is also being seen by physical therapy for spine disease at this time  Lab Results  Component Value Date   HGBA1C 5.6 10/22/2023   Allopurinol  Lyrica  Crestor  Flomax  Mounjaro  Celebrex   Colon cancer screening appears to be due Can suggest COVID booster Eye exam Foot exam is due Update A1c Offer pneumonia vaccination Flu shot up-to-date  Patient Active Problem List   Diagnosis Date Noted   OA (osteoarthritis) of knee 09/19/2021   Diabetes mellitus, type 2 (HCC) 06/28/2019   Sepsis secondary to UTI (HCC) 11/17/2017   UTI (urinary tract infection) 11/17/2017   Left calcaneal bursitis 12/14/2014   Pronation deformity of ankle, acquired 10/26/2014   Allergic reaction 10/14/2014   Achilles tendinosis 10/05/2014   OSA (obstructive sleep apnea) 08/18/2014   Gout 08/14/2014   Obesity 06/02/2013   Abnormal MRI, spine 05/09/2013   H/O urinary retention 05/09/2013   Psoriasis 05/08/2012   Coronary artery disease 08/03/2008   Hyperlipidemia 11/10/2007   History of gout 11/10/2007   Essential hypertension 11/10/2007   G E R D 01/06/2007    Past Medical History:  Diagnosis Date   BPH (benign prostatic  hypertrophy) with urinary retention    CAD (coronary artery disease) 04/2008   successful PCI of the lesion in the proximal LAD using xience drug-eluting stent with improvement in central narrowing from 95% to 0%.  Dr. Obie   Foley catheter in place    Gout    per pt stable is of 08-18-2014   History of acute bronchitis    recently 07-16-2014   History of sepsis    urosepsis--  08-13-2014 admission   Hyperlipidemia LDL goal <100    Hypertension    Multilevel degenerative disc disease    cervical and lumbar   OSA (obstructive sleep apnea)        Psoriasis    S/P drug eluting coronary stent placement    05-13-2008   DES X1 TO pLAD   Sigmoid diverticulosis     Past Surgical History:  Procedure Laterality Date   BUNIONECTOMY Right 2000   COLONOSCOPY WITH PROPOFOL   last one 10-06-2013   CORONARY ANGIOPLASTY WITH STENT PLACEMENT  05-13-2008    Dr Wolm Obie   PCI w/  DES x1 to  pLAD/  ef 65%   GREEN LIGHT LASER TURP (TRANSURETHRAL RESECTION OF PROSTATE N/A 08/23/2014   Procedure: GREEN LIGHT LASER TURP (TRANSURETHRAL RESECTION OF PROSTATE;  Surgeon: Norleen Seltzer, MD;  Location: [facility];  Service: Urology;  Laterality: N/A;   WRIST GANGLION EXCISION Left 2001    Social History[1]  Family History  Problem Relation Age of Onset   Hypertension Father  Stroke Father 34   Lung cancer Mother         smoker   Aneurysm Mother    Diabetes Sister    Heart disease Brother 17        stent LAD 2012   Colon cancer Neg Hx    Pancreatic cancer Neg Hx    Rectal cancer Neg Hx    Stomach cancer Neg Hx     Allergies[2]  Medication list has been reviewed and updated.  Medications Ordered Prior to Encounter[3]  Review of Systems:  As per HPI- otherwise negative.   Physical Examination: There were no vitals filed for this visit. There were no vitals filed for this visit. There is no height or weight on file to calculate BMI. Ideal Body Weight:    GEN: no  acute distress. HEENT: Atraumatic, Normocephalic.  Ears and Nose: No external deformity. CV: RRR, No M/G/R. No JVD. No thrill. No extra heart sounds. PULM: CTA B, no wheezes, crackles, rhonchi. No retractions. No resp. distress. No accessory muscle use. ABD: S, NT, ND, +BS. No rebound. No HSM. EXTR: No c/c/e PSYCH: Normally interactive. Conversant.    Assessment and Plan: No diagnosis found.  Assessment & Plan   Signed Harlene Schroeder, MD    [1]  Social History Tobacco Use   Smoking status: Former    Current packs/day: 0.00    Average packs/day: 0.8 packs/day for 14.0 years (10.5 ttl pk-yrs)    Types: Cigarettes    Start date: 05/28/1983    Quit date: 05/27/1997    Years since quitting: 27.0   Smokeless tobacco: Former    Types: Snuff    Quit date: 08/17/1997  Vaping Use   Vaping status: Never Used  Substance Use Topics   Alcohol  use: Yes    Comment: occasional   Drug use: No  [2]  Allergies Allergen Reactions   Lidocaine  Anaphylaxis    REACTION: anaphylactic shock (also he was on Neomycin sulfate)   Neomycin Sulfate Anaphylaxis    REACTION: anaphylactic shock (also on Lidocaine   topically)   Sulfa Antibiotics Anaphylaxis   Sulfa Drugs Cross Reactors Anaphylaxis   Hctz [Hydrochlorothiazide] Other (See Comments)    Gout    Tramadol  Other (See Comments)    05/26/13 urinary retention   Atrovent  [Ipratropium] Other (See Comments)    Caused Urinary retention (this is the nasal spray)   Latex Itching and Rash    Skin irritation  [3]  Current Outpatient Medications on File Prior to Visit  Medication Sig Dispense Refill   allopurinol  (ZYLOPRIM ) 300 MG tablet TAKE 1 TABLET BY MOUTH  DAILY 90 tablet 3   amoxicillin -clavulanate (AUGMENTIN ) 875-125 MG tablet Take 1 tablet by mouth 2 (two) times daily. 14 tablet 0   celecoxib  (CELEBREX ) 100 MG capsule Take 1 capsule (100 mg total) by mouth 2 (two) times daily. 60 capsule 0   cetirizine (ZYRTEC) 10 MG tablet Take 10 mg by  mouth at bedtime.     cholecalciferol (VITAMIN D3) 25 MCG (1000 UNIT) tablet Take 1,000 Units by mouth daily.     clonazePAM  (KLONOPIN ) 0.5 MG tablet Take 1 tablet (0.5 mg total) by mouth 2 (two) times daily as needed for anxiety. 20 tablet 1   Coenzyme Q10 (CO Q 10 PO) Take 1 capsule by mouth every morning. 500mg      cyanocobalamin  (VITAMIN B12) 1000 MCG tablet Take 1,000 mcg by mouth daily.     ferrous sulfate 325 (65 FE) MG EC tablet Take 325 mg  by mouth 3 (three) times daily with meals.     finasteride  (PROSCAR ) 5 MG tablet Take 1 tablet (5 mg total) by mouth daily. 30 tablet 0   fluocinonide  (LIDEX ) 0.05 % external solution Apply 1 Application topically 2 (two) times daily. As needed for psoriasis.  300 ml is a 90 day supply 300 mL 3   fluocinonide  cream (LIDEX ) 0.05 % Apply 1 Application topically 2 (two) times daily. As needed for psoriasis 60 g 2   FLUoxetine  (PROZAC ) 20 MG capsule Take 1 capsule (20 mg total) by mouth daily. 90 capsule 3   fluticasone  (FLONASE ) 50 MCG/ACT nasal spray Place 2 sprays into both nostrils daily. 16 g 1   metFORMIN  (GLUCOPHAGE ) 500 MG tablet Take 1 tablet (500 mg total) by mouth daily. Due for appt 90 tablet 0   methocarbamol  (ROBAXIN ) 500 MG tablet Take 1 tablet (500 mg total) by mouth every 8 (eight) hours as needed for muscle spasms. 30 tablet 1   metoprolol  succinate (TOPROL -XL) 100 MG 24 hr tablet Take 1 tablet (100 mg total) by mouth daily. Take with or immediately following a meal 90 tablet 3   Multiple Vitamin (MULTIVITAMIN) capsule Take 1 capsule by mouth every morning.     nitroGLYCERIN  (NITROSTAT ) 0.3 MG SL tablet Place 1 tablet (0.3 mg total) under the tongue every 5 (five) minutes as needed for chest pain. 25 tablet 2   Omega-3 Fatty Acids (FISH OIL) 1000 MG CAPS Take 1,000 mg by mouth every morning.     omeprazole  (PRILOSEC) 20 MG capsule Take 1 capsule (20 mg total) by mouth 2 (two) times daily before a meal. 180 capsule 3   oxyCODONE  (ROXICODONE )  5 MG immediate release tablet Take 1 tablet (5 mg total) by mouth every 4 (four) hours as needed for severe pain (pain score 7-10). 30 tablet 0   predniSONE  (DELTASONE ) 20 MG tablet Take 2 tablets by mouth daily for 3 days then take 1 tablet for 3 days. 9 tablet 0   pregabalin  (LYRICA ) 300 MG capsule Take 1 capsule (300 mg total) by mouth 2 (two) times daily. 60 capsule 0   rosuvastatin  (CRESTOR ) 20 MG tablet Take 1 tablet (20 mg total) by mouth daily. 90 tablet 3   tamsulosin  (FLOMAX ) 0.4 MG CAPS capsule Take 1 capsule (0.4 mg total) by mouth daily after supper. 30 capsule 0   tirzepatide  (MOUNJARO ) 15 MG/0.5ML Pen Inject 15 mg into the skin once a week. 6 mL 1   No current facility-administered medications on file prior to visit.   "

## 2024-06-23 ENCOUNTER — Ambulatory Visit (INDEPENDENT_AMBULATORY_CARE_PROVIDER_SITE_OTHER): Admitting: Family Medicine

## 2024-06-23 ENCOUNTER — Encounter: Payer: Self-pay | Admitting: Family Medicine

## 2024-06-23 ENCOUNTER — Other Ambulatory Visit (HOSPITAL_BASED_OUTPATIENT_CLINIC_OR_DEPARTMENT_OTHER): Payer: Self-pay

## 2024-06-23 VITALS — BP 124/84 | HR 88 | Ht 68.0 in | Wt 251.2 lb

## 2024-06-23 DIAGNOSIS — I10 Essential (primary) hypertension: Secondary | ICD-10-CM | POA: Diagnosis not present

## 2024-06-23 DIAGNOSIS — E782 Mixed hyperlipidemia: Secondary | ICD-10-CM

## 2024-06-23 DIAGNOSIS — Z125 Encounter for screening for malignant neoplasm of prostate: Secondary | ICD-10-CM | POA: Diagnosis not present

## 2024-06-23 DIAGNOSIS — K219 Gastro-esophageal reflux disease without esophagitis: Secondary | ICD-10-CM

## 2024-06-23 DIAGNOSIS — I2510 Atherosclerotic heart disease of native coronary artery without angina pectoris: Secondary | ICD-10-CM | POA: Diagnosis not present

## 2024-06-23 DIAGNOSIS — Z111 Encounter for screening for respiratory tuberculosis: Secondary | ICD-10-CM | POA: Diagnosis not present

## 2024-06-23 DIAGNOSIS — J0111 Acute recurrent frontal sinusitis: Secondary | ICD-10-CM

## 2024-06-23 DIAGNOSIS — Z7984 Long term (current) use of oral hypoglycemic drugs: Secondary | ICD-10-CM | POA: Diagnosis not present

## 2024-06-23 DIAGNOSIS — G629 Polyneuropathy, unspecified: Secondary | ICD-10-CM

## 2024-06-23 DIAGNOSIS — E119 Type 2 diabetes mellitus without complications: Secondary | ICD-10-CM | POA: Diagnosis not present

## 2024-06-23 LAB — COMPREHENSIVE METABOLIC PANEL WITH GFR
ALT: 28 U/L (ref 3–53)
AST: 22 U/L (ref 5–37)
Albumin: 4.6 g/dL (ref 3.5–5.2)
Alkaline Phosphatase: 45 U/L (ref 39–117)
BUN: 15 mg/dL (ref 6–23)
CO2: 31 meq/L (ref 19–32)
Calcium: 9.2 mg/dL (ref 8.4–10.5)
Chloride: 102 meq/L (ref 96–112)
Creatinine, Ser: 0.82 mg/dL (ref 0.40–1.50)
GFR: 92.17 mL/min
Glucose, Bld: 79 mg/dL (ref 70–99)
Potassium: 4.4 meq/L (ref 3.5–5.1)
Sodium: 139 meq/L (ref 135–145)
Total Bilirubin: 0.6 mg/dL (ref 0.2–1.2)
Total Protein: 6.9 g/dL (ref 6.0–8.3)

## 2024-06-23 LAB — LIPID PANEL
Cholesterol: 114 mg/dL (ref 28–200)
HDL: 32.9 mg/dL — ABNORMAL LOW
LDL Cholesterol: 37 mg/dL (ref 10–99)
NonHDL: 80.74
Total CHOL/HDL Ratio: 3
Triglycerides: 219 mg/dL — ABNORMAL HIGH (ref 10.0–149.0)
VLDL: 43.8 mg/dL — ABNORMAL HIGH (ref 0.0–40.0)

## 2024-06-23 LAB — CBC
HCT: 46 % (ref 39.0–52.0)
Hemoglobin: 15.4 g/dL (ref 13.0–17.0)
MCHC: 33.4 g/dL (ref 30.0–36.0)
MCV: 90.5 fl (ref 78.0–100.0)
Platelets: 208 10*3/uL (ref 150.0–400.0)
RBC: 5.09 Mil/uL (ref 4.22–5.81)
RDW: 13.4 % (ref 11.5–15.5)
WBC: 6.2 10*3/uL (ref 4.0–10.5)

## 2024-06-23 LAB — MICROALBUMIN / CREATININE URINE RATIO
Creatinine,U: 145.3 mg/dL
Microalb Creat Ratio: UNDETERMINED mg/g (ref 0.0–30.0)
Microalb, Ur: <0.7 mg/dL

## 2024-06-23 LAB — HEMOGLOBIN A1C: Hgb A1c MFr Bld: 5.6 % (ref 4.6–6.5)

## 2024-06-23 MED ORDER — METFORMIN HCL 500 MG PO TABS: 500.0000 mg | ORAL_TABLET | Freq: Every day | ORAL | 3 refills | Status: AC

## 2024-06-23 MED ORDER — AMOXICILLIN 875 MG PO TABS
875.0000 mg | ORAL_TABLET | Freq: Two times a day (BID) | ORAL | 0 refills | Status: AC
  Filled 2024-06-23: qty 20, 10d supply, fill #0

## 2024-06-23 MED ORDER — OMEPRAZOLE 20 MG PO CPDR: 20.0000 mg | DELAYED_RELEASE_CAPSULE | Freq: Two times a day (BID) | ORAL | 3 refills | Status: AC

## 2024-06-24 ENCOUNTER — Encounter: Payer: Self-pay | Admitting: Family Medicine

## 2024-06-24 LAB — PSA, MEDICARE: PSA: 1.34 ng/mL (ref 0.10–4.00)

## 2024-06-25 LAB — QUANTIFERON-TB GOLD PLUS
Mitogen-NIL: 7.44 [IU]/mL
NIL: 0.02 [IU]/mL
QuantiFERON-TB Gold Plus: NEGATIVE
TB1-NIL: 0 [IU]/mL
TB2-NIL: 0.01 [IU]/mL

## 2024-06-26 ENCOUNTER — Encounter: Payer: Self-pay | Admitting: Family Medicine

## 2024-06-28 ENCOUNTER — Encounter: Payer: Self-pay | Admitting: Family Medicine

## 2024-06-28 NOTE — Telephone Encounter (Signed)
 Results for orders placed or performed in visit on 06/23/24  CBC   Collection Time: 06/23/24 11:12 AM  Result Value Ref Range   WBC 6.2 4.0 - 10.5 K/uL   RBC 5.09 4.22 - 5.81 Mil/uL   Platelets 208.0 150.0 - 400.0 K/uL   Hemoglobin 15.4 13.0 - 17.0 g/dL   HCT 53.9 60.9 - 47.9 %   MCV 90.5 78.0 - 100.0 fl   MCHC 33.4 30.0 - 36.0 g/dL   RDW 86.5 88.4 - 84.4 %  Comprehensive metabolic panel with GFR   Collection Time: 06/23/24 11:12 AM  Result Value Ref Range   Sodium 139 135 - 145 mEq/L   Potassium 4.4 3.5 - 5.1 mEq/L   Chloride 102 96 - 112 mEq/L   CO2 31 19 - 32 mEq/L   Glucose, Bld 79 70 - 99 mg/dL   BUN 15 6 - 23 mg/dL   Creatinine, Ser 9.17 0.40 - 1.50 mg/dL   Total Bilirubin 0.6 0.2 - 1.2 mg/dL   Alkaline Phosphatase 45 39 - 117 U/L   AST 22 5 - 37 U/L   ALT 28 3 - 53 U/L   Total Protein 6.9 6.0 - 8.3 g/dL   Albumin 4.6 3.5 - 5.2 g/dL   GFR 07.82 >39.99 mL/min   Calcium  9.2 8.4 - 10.5 mg/dL  Hemoglobin J8r   Collection Time: 06/23/24 11:12 AM  Result Value Ref Range   Hgb A1c MFr Bld 5.6 4.6 - 6.5 %  Microalbumin / creatinine urine ratio   Collection Time: 06/23/24 11:12 AM  Result Value Ref Range   Microalb, Ur <0.7 mg/dL   Creatinine,U 854.6 mg/dL   Microalb Creat Ratio Unable to calculate 0.0 - 30.0 mg/g  Lipid panel   Collection Time: 06/23/24 11:12 AM  Result Value Ref Range   Cholesterol 114 28 - 200 mg/dL   Triglycerides 780.9 (H) 10.0 - 149.0 mg/dL   HDL 67.09 (L) >60.99 mg/dL   VLDL 56.1 (H) 0.0 - 59.9 mg/dL   LDL Cholesterol 37 10 - 99 mg/dL   Total CHOL/HDL Ratio 3    NonHDL 80.74   PSA, Medicare ( [place] [facility] only)   Collection Time: 06/23/24 11:12 AM  Result Value Ref Range   PSA 1.34 0.10 - 4.00 ng/ml  QuantiFERON-TB Gold Plus   Collection Time: 06/23/24 11:12 AM  Result Value Ref Range   QuantiFERON-TB Gold Plus NEGATIVE NEGATIVE   NIL 0.02 IU/mL   Mitogen-NIL 7.44 IU/mL   TB1-NIL 0.00 IU/mL   TB2-NIL 0.01 IU/mL

## 2024-10-25 ENCOUNTER — Encounter: Admitting: Family Medicine
